# Patient Record
Sex: Female | Born: 1935 | Race: White | Hispanic: No | Smoking: Former smoker
Health system: Southern US, Community
[De-identification: ages and names within clinical notes are randomized; demographics above are authoritative.]

## PROBLEM LIST (undated history)

## (undated) ENCOUNTER — Emergency Department: Payer: Medicare Other

## (undated) DIAGNOSIS — E785 Hyperlipidemia, unspecified: Secondary | ICD-10-CM

## (undated) DIAGNOSIS — C50919 Malignant neoplasm of unspecified site of unspecified female breast: Secondary | ICD-10-CM

## (undated) DIAGNOSIS — I4891 Unspecified atrial fibrillation: Secondary | ICD-10-CM

## (undated) DIAGNOSIS — I10 Essential (primary) hypertension: Secondary | ICD-10-CM

## (undated) DIAGNOSIS — F419 Anxiety disorder, unspecified: Secondary | ICD-10-CM

## (undated) DIAGNOSIS — M109 Gout, unspecified: Secondary | ICD-10-CM

## (undated) DIAGNOSIS — M1990 Unspecified osteoarthritis, unspecified site: Secondary | ICD-10-CM

## (undated) HISTORY — PX: KNEE ARTHROSCOPY: SUR90

## (undated) HISTORY — PX: CATARACT EXTRACTION: SUR2

## (undated) HISTORY — DX: Gout, unspecified: M10.9

## (undated) HISTORY — PX: ABDOMINAL HYSTERECTOMY: SHX81

## (undated) HISTORY — DX: Anxiety disorder, unspecified: F41.9

## (undated) HISTORY — PX: BIOPSY BREAST: PRO8

## (undated) HISTORY — DX: Malignant neoplasm of unspecified site of unspecified female breast: C50.919

---

## 2002-11-03 ENCOUNTER — Encounter: Payer: Self-pay | Admitting: Internal Medicine

## 2002-11-03 ENCOUNTER — Ambulatory Visit (HOSPITAL_COMMUNITY): Admission: RE | Admit: 2002-11-03 | Discharge: 2002-11-03 | Payer: Self-pay | Admitting: Internal Medicine

## 2003-11-09 ENCOUNTER — Ambulatory Visit (HOSPITAL_COMMUNITY): Admission: RE | Admit: 2003-11-09 | Discharge: 2003-11-09 | Payer: Self-pay | Admitting: Internal Medicine

## 2009-10-11 ENCOUNTER — Encounter: Payer: Self-pay | Admitting: Internal Medicine

## 2009-10-27 ENCOUNTER — Ambulatory Visit: Payer: Self-pay | Admitting: Internal Medicine

## 2009-10-28 ENCOUNTER — Telehealth: Payer: Self-pay | Admitting: Internal Medicine
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
## 2010-08-31 NOTE — Letter (Signed)
Summary: [facility]  [facility] Note   Imported By: Roderic Ovens 11/07/2009 11:36:47  _____________________________________________________________________  External Attachment:    Type:   Image     Comment:   External Document

## 2010-08-31 NOTE — Progress Notes (Signed)
Summary: QUESTION ABOUT MEDICATION  Phone Note Call from Patient Call back at Home Phone [phone]   Caller: Patient Summary of Call: PT  HAVE QUESTION ABOUT MEDICATIONS Initial call taken by: Judie Grieve,  October 28, 2009 9:45 AM  Follow-up for Phone Call        Called patient ..she wanted to know what medication Dr.Saylee Sherrill said she could stop. Advised I will ask Dr.Bethann Qualley and call her back. Follow-up by: Suzan Garibaldi RN  Additional Follow-up for Phone Call Additional follow up Details #1::        Discussed above with Dr.Iyanni Hepp...she advised that patient could stop Lonoxin.Marland KitchenMarland KitchenCalled patient and left message on machine with this information. Additional Follow-up by: Suzan Garibaldi RN

## 2011-02-19 ENCOUNTER — Other Ambulatory Visit (HOSPITAL_COMMUNITY): Payer: Self-pay | Admitting: Internal Medicine

## 2011-02-19 ENCOUNTER — Ambulatory Visit (HOSPITAL_COMMUNITY)
Admission: RE | Admit: 2011-02-19 | Discharge: 2011-02-19 | Disposition: A | Discharge status: Home / Self Care | Payer: Medicare Other | Source: Ambulatory Visit | Attending: Internal Medicine | Admitting: Internal Medicine

## 2011-02-19 DIAGNOSIS — R52 Pain, unspecified: Secondary | ICD-10-CM

## 2011-02-19 DIAGNOSIS — M25569 Pain in unspecified knee: Secondary | ICD-10-CM | POA: Insufficient documentation

## 2011-04-30 ENCOUNTER — Ambulatory Visit
Admission: RE | Admit: 2011-04-30 | Discharge: 2011-04-30 | Disposition: A | Discharge status: Home / Self Care | Payer: Medicare Other | Source: Ambulatory Visit | Attending: Orthopaedic Surgery | Admitting: Orthopaedic Surgery

## 2011-04-30 ENCOUNTER — Other Ambulatory Visit: Payer: Self-pay | Admitting: Orthopaedic Surgery

## 2011-04-30 ENCOUNTER — Encounter (HOSPITAL_BASED_OUTPATIENT_CLINIC_OR_DEPARTMENT_OTHER)
Admission: RE | Admit: 2011-04-30 | Discharge: 2011-04-30 | Disposition: A | Discharge status: Home / Self Care | Payer: Medicare Other | Source: Ambulatory Visit | Attending: Orthopaedic Surgery | Admitting: Orthopaedic Surgery

## 2011-04-30 DIAGNOSIS — Z01811 Encounter for preprocedural respiratory examination: Secondary | ICD-10-CM

## 2011-04-30 LAB — BASIC METABOLIC PANEL
BUN: 18 mg/dL (ref 6–23)
Chloride: 101 mEq/L (ref 96–112)
GFR calc Af Amer: 74 mL/min — ABNORMAL LOW (ref >90)
GFR calc non Af Amer: 64 mL/min — ABNORMAL LOW (ref >90)
Potassium: 3.6 mEq/L (ref 3.5–5.1)
Sodium: 140 mEq/L (ref 135–145)

## 2011-05-01 ENCOUNTER — Ambulatory Visit (HOSPITAL_BASED_OUTPATIENT_CLINIC_OR_DEPARTMENT_OTHER)
Admission: RE | Admit: 2011-05-01 | Discharge: 2011-05-01 | Disposition: A | Discharge status: Home / Self Care | Payer: Medicare Other | Source: Ambulatory Visit | Attending: Orthopaedic Surgery | Admitting: Orthopaedic Surgery

## 2011-05-01 DIAGNOSIS — Z01812 Encounter for preprocedural laboratory examination: Secondary | ICD-10-CM | POA: Insufficient documentation

## 2011-05-01 DIAGNOSIS — M9420 Chondromalacia, unspecified site: Secondary | ICD-10-CM | POA: Insufficient documentation

## 2011-05-01 DIAGNOSIS — F329 Major depressive disorder, single episode, unspecified: Secondary | ICD-10-CM | POA: Insufficient documentation

## 2011-05-01 DIAGNOSIS — F3289 Other specified depressive episodes: Secondary | ICD-10-CM | POA: Insufficient documentation

## 2011-05-01 DIAGNOSIS — I10 Essential (primary) hypertension: Secondary | ICD-10-CM | POA: Insufficient documentation

## 2011-05-01 DIAGNOSIS — M23329 Other meniscus derangements, posterior horn of medial meniscus, unspecified knee: Secondary | ICD-10-CM | POA: Insufficient documentation

## 2011-05-01 DIAGNOSIS — Z01818 Encounter for other preprocedural examination: Secondary | ICD-10-CM | POA: Insufficient documentation

## 2011-05-01 LAB — POCT HEMOGLOBIN-HEMACUE: Hemoglobin: 14.9 g/dL (ref 12.0–15.0)

## 2011-05-24 NOTE — Op Note (Signed)
  [name], Ruth Delgado              ACCOUNT NO.:  [id number]  MEDICAL RECORD NO.:  [id number]  LOCATION:                                 FACILITY:  PHYSICIAN:  Lubertha Basque. Wymon Swaney, M.D.DATE OF BIRTH:  03-26-1935  DATE OF PROCEDURE:  05/01/2011 DATE OF DISCHARGE:                              OPERATIVE REPORT   PREOPERATIVE DIAGNOSES: 1. Left knee torn medial meniscus. 2. Left knee chondromalacia.  POSTOPERATIVE DIAGNOSES: 1. Left knee torn medial meniscus. 2. Left knee chondromalacia.  PROCEDURES: 1. Left knee partial medial meniscectomy. 2. Left knee abrasion chondroplasty.  ANESTHESIA:  Knee block MAC.  ATTENDING SURGEON:  Lubertha Basque. Jerl Santos, MD  ASSISTANT:  Lindwood Qua, PA   INDICATIONS FOR PROCEDURE:  The patient is a 76 year old woman with a long history of a painful left knee.  This has persisted despite oral anti-inflammatories and an injection.  By MRI scan, she has medial meniscus tear and a paucity of degenerative issues.  She has pain which limits her by rest and walk and she is offered an arthroscopy.  Informed operative consent was obtained after discussion of possible complications including reaction to anesthesia and infection.  SUMMARY OF FINDINGS AND PROCEDURE:  Under knee block and MAC, an arthroscopy of the left knee was performed.  Suprapatellar pouch was benign while the patellofemoral joint exhibited focal breakdown at the apex of the patella addressed with abrasion to bleeding bone in one tiny area on the patella.  This joint tracked very well.  The medial compartment was notable for posterior horn medial meniscus tear consistent with her scan and about a 10% partial medial meniscectomy was done.  Unfortunately, she also had a quarter-sized area of bare bone with large flaps of articular cartilage on the femur addressed with chondroplasty as appropriate.  An ACL was intact.  Lateral compartment exhibited no evidence of meniscal or articular  cartilage injury.  DESCRIPTION OF PROCEDURE:  The patient was taken to the operating suite where knee block and MAC were applied.  She was positioned supine, prepped and draped in normal sterile fashion.  After administration of IV Kefzol, an arthroscopy of the left knee was performed through a total of two portals.  Findings were as noted above and procedure consisted of the partial medial meniscectomy with basket and shaver.  We also did the abrasion chondroplasty patellofemoral and extensive chondroplasty medial.  The knee was thoroughly irrigated followed by placement of Marcaine with epinephrine and some Depo-Medrol.  Adaptic was applied to the portals followed by dry gauze and a loose Ace wrap.  Estimated blood loss and intraoperative fluids can be obtained from anesthesia records.  DISPOSITION:  The patient was taken to recovery room in stable addition. She was to go home the same-day and follow up in the office in less than a week.  I will contact her by phone tonight.     Lubertha Basque Jerl Santos, M.D.     PGD/MEDQ  D:  05/01/2011  T:  05/01/2011  Job:  [id number]  Electronically Signed by Marcene Corning M.D. on 05/24/2011 01:43:39 PM

## 2011-08-15 DIAGNOSIS — E7800 Pure hypercholesterolemia, unspecified: Secondary | ICD-10-CM | POA: Diagnosis not present

## 2011-08-15 DIAGNOSIS — I10 Essential (primary) hypertension: Secondary | ICD-10-CM | POA: Diagnosis not present

## 2011-08-23 DIAGNOSIS — Z79899 Other long term (current) drug therapy: Secondary | ICD-10-CM | POA: Diagnosis not present

## 2011-08-23 DIAGNOSIS — E559 Vitamin D deficiency, unspecified: Secondary | ICD-10-CM | POA: Diagnosis not present

## 2011-08-23 DIAGNOSIS — E782 Mixed hyperlipidemia: Secondary | ICD-10-CM | POA: Diagnosis not present

## 2011-08-23 DIAGNOSIS — I10 Essential (primary) hypertension: Secondary | ICD-10-CM | POA: Diagnosis not present

## 2011-08-23 DIAGNOSIS — R7309 Other abnormal glucose: Secondary | ICD-10-CM | POA: Diagnosis not present

## 2011-09-07 ENCOUNTER — Emergency Department (HOSPITAL_COMMUNITY)
Admission: EM | Admit: 2011-09-07 | Discharge: 2011-09-07 | Disposition: A | Discharge status: Home / Self Care | Payer: Medicare Other | Attending: Emergency Medicine | Admitting: Emergency Medicine

## 2011-09-07 ENCOUNTER — Encounter (HOSPITAL_COMMUNITY): Payer: Self-pay | Admitting: Emergency Medicine

## 2011-09-07 ENCOUNTER — Emergency Department (HOSPITAL_COMMUNITY): Payer: Medicare Other

## 2011-09-07 ENCOUNTER — Other Ambulatory Visit: Payer: Self-pay

## 2011-09-07 DIAGNOSIS — I10 Essential (primary) hypertension: Secondary | ICD-10-CM | POA: Insufficient documentation

## 2011-09-07 DIAGNOSIS — E119 Type 2 diabetes mellitus without complications: Secondary | ICD-10-CM | POA: Diagnosis not present

## 2011-09-07 DIAGNOSIS — Z79899 Other long term (current) drug therapy: Secondary | ICD-10-CM | POA: Diagnosis not present

## 2011-09-07 DIAGNOSIS — R110 Nausea: Secondary | ICD-10-CM | POA: Insufficient documentation

## 2011-09-07 DIAGNOSIS — M129 Arthropathy, unspecified: Secondary | ICD-10-CM | POA: Diagnosis not present

## 2011-09-07 DIAGNOSIS — R404 Transient alteration of awareness: Secondary | ICD-10-CM | POA: Diagnosis not present

## 2011-09-07 DIAGNOSIS — R42 Dizziness and giddiness: Secondary | ICD-10-CM | POA: Insufficient documentation

## 2011-09-07 DIAGNOSIS — E785 Hyperlipidemia, unspecified: Secondary | ICD-10-CM | POA: Insufficient documentation

## 2011-09-07 DIAGNOSIS — Z7982 Long term (current) use of aspirin: Secondary | ICD-10-CM | POA: Insufficient documentation

## 2011-09-07 DIAGNOSIS — I4891 Unspecified atrial fibrillation: Secondary | ICD-10-CM | POA: Insufficient documentation

## 2011-09-07 DIAGNOSIS — R5381 Other malaise: Secondary | ICD-10-CM | POA: Diagnosis not present

## 2011-09-07 DIAGNOSIS — R55 Syncope and collapse: Secondary | ICD-10-CM | POA: Diagnosis not present

## 2011-09-07 HISTORY — DX: Hyperlipidemia, unspecified: E78.5

## 2011-09-07 HISTORY — DX: Unspecified osteoarthritis, unspecified site: M19.90

## 2011-09-07 HISTORY — DX: Essential (primary) hypertension: I10

## 2011-09-07 HISTORY — DX: Unspecified atrial fibrillation: I48.91

## 2011-09-07 LAB — COMPREHENSIVE METABOLIC PANEL
ALT: 14 U/L (ref 0–35)
AST: 16 U/L (ref 0–37)
Alkaline Phosphatase: 46 U/L (ref 39–117)
CO2: 26 mEq/L (ref 19–32)
Calcium: 9.1 mg/dL (ref 8.4–10.5)
GFR calc non Af Amer: 83 mL/min — ABNORMAL LOW (ref >90)
Potassium: 3.8 mEq/L (ref 3.5–5.1)
Sodium: 138 mEq/L (ref 135–145)
Total Protein: 6.8 g/dL (ref 6.0–8.3)

## 2011-09-07 LAB — URINALYSIS, ROUTINE W REFLEX MICROSCOPIC
Bilirubin Urine: NEGATIVE
Hgb urine dipstick: NEGATIVE
Specific Gravity, Urine: 1.014 (ref 1.005–1.030)
pH: 7.5 (ref 5.0–8.0)

## 2011-09-07 LAB — CBC
MCH: 32.3 pg (ref 26.0–34.0)
Platelets: 172 10*3/uL (ref 150–400)
RBC: 4.64 MIL/uL (ref 3.87–5.11)

## 2011-09-07 MED ORDER — ONDANSETRON HCL 4 MG PO TABS: 4.0000 mg | ORAL_TABLET | Freq: Four times a day (QID) | ORAL | Status: AC

## 2011-09-07 MED ORDER — ONDANSETRON HCL 4 MG/2ML IJ SOLN
4.0000 mg | Freq: Once | INTRAMUSCULAR | Status: AC
  Administered 2011-09-07: 4 mg via INTRAVENOUS
  Filled 2011-09-07: qty 2

## 2011-09-07 MED ORDER — SODIUM CHLORIDE 0.9 % IV BOLUS (SEPSIS)
1000.0000 mL | Freq: Once | INTRAVENOUS | Status: AC
  Administered 2011-09-07: 1000 mL via INTRAVENOUS

## 2011-09-07 NOTE — ED Notes (Signed)
Patient given ginger ale. 

## 2011-09-07 NOTE — ED Notes (Signed)
WJX:[id number]<WG> Expected date:<BR> Expected time:10:52 AM<BR> Means of arrival:Ambulance<BR> Comments:<BR> [id number] - 76YOf nAUSEA, DIZZY, WEAK, ORTHOSTATIC

## 2011-09-07 NOTE — ED Provider Notes (Signed)
History     CSN: [id number]  Arrival date & time 09/07/11  1107   First MD Initiated Contact with Patient 09/07/11 1221      Chief Complaint  Patient presents with  . Near Syncope  . Nausea    (Consider location/radiation/quality/duration/timing/severity/associated sxs/prior treatment) HPI Patient presents with complaint of nausea and generalized weakness. Her symptoms began this morning. She states that when she stood up from bed she was lightheaded and weak all over with nausea. She had no vomiting. She had no syncope or. She denies any sensation of vertigo or movement/spinning. She had no chest pain. She had no focal weakness or numbness changes in vision or changes in speech. She states that she was in her usual state of health yesterday and last night his symptoms began this morning when she woke. There no other associated systemic symptoms. Symptoms are worse with standing and made better by rest.  There are no other alleviating or modifying factors.   Past Medical History  Diagnosis Date  . Diabetes mellitus   . Hypertension   . A-fib   . Arthritis   . Hyperlipemia     Past Surgical History  Procedure Date  . Knee arthroscopy   . Abdominal hysterectomy   . Biopsy breast     (L) BREAST IN 1993    History reviewed. No pertinent family history.  History  Substance Use Topics  . Smoking status: Never Smoker   . Smokeless tobacco: Not on file  . Alcohol Use: No    OB History    Grav Para Term Preterm Abortions TAB SAB Ect Mult Living                  Review of Systems ROS reviewed and otherwise negative except for mentioned in HPI  Allergies  Sulfa antibiotics and Sulfonamide derivatives  Home Medications   Current Outpatient Rx  Name Route Sig Dispense Refill  . ALLOPURINOL 300 MG PO TABS Oral Take 300 mg by mouth daily.    . ASPIRIN EC 81 MG PO TBEC Oral Take 81 mg by mouth daily.    . ATENOLOL 50 MG PO TABS Oral Take 50 mg by mouth daily.    Marland Kitchen  VITAMIN D 1000 UNITS PO TABS Oral Take 1,000 Units by mouth every other day.    Marland Kitchen DILTIAZEM HCL ER 240 MG PO CP24 Oral Take 240 mg by mouth daily.    . OMEGA-3 FATTY ACIDS 1000 MG PO CAPS Oral Take 1 g by mouth daily.    . INDAPAMIDE 1.25 MG PO TABS Oral Take 1.25 mg by mouth every morning.    Marland Kitchen LISINOPRIL 20 MG PO TABS Oral Take 20 mg by mouth daily.    Marland Kitchen PRAVASTATIN SODIUM 40 MG PO TABS Oral Take 40 mg by mouth daily.    Marland Kitchen ONDANSETRON HCL 4 MG PO TABS Oral Take 1 tablet (4 mg total) by mouth every 6 (six) hours. 12 tablet 0    BP 167/84  Pulse 79  Temp(Src) 98.1 F (36.7 C) (Oral)  Resp 16  SpO2 96% Vitals reviewed Physical Exam Physical Examination: General appearance - alert, well appearing, and in no distress Mental status - alert, oriented to person, place, and time Eyes - pupils equal and reactive, extraocular eye movements intact Mouth - mucous membranes moist, pharynx normal without lesions Chest - clear to auscultation, no wheezes, rales or rhonchi, symmetric air entry Heart - normal rate, regular rhythm, normal S1, S2, no murmurs,  rubs, clicks or gallops Abdomen - soft, nontender, nondistended, no masses or organomegaly, nabs Neurological - alert, oriented, normal speech, cranial nerves 2-12 tested and intact, strength 5/5 in extremities x 4, sensation intact, FTN testing intact bilaterally Musculoskeletal - no joint tenderness, deformity or swelling Extremities - peripheral pulses normal, no pedal edema, no clubbing or cyanosis Skin - normal coloration and turgor, no rashes  ED Course  Procedures (including critical care time)   Date: 09/07/2011  Rate: 74  Rhythm: normal sinus rhythm  QRS Axis: normal  Intervals: normal  ST/T Wave abnormalities: normal  Conduction Disutrbances:none  Narrative Interpretation:   Old EKG Reviewed: none available    Labs Reviewed  COMPREHENSIVE METABOLIC PANEL - Abnormal; Notable for the following:    Glucose, Bld 144 (*)    GFR  calc non Af Amer 83 (*)    All other components within normal limits  CBC  URINALYSIS, ROUTINE W REFLEX MICROSCOPIC  LAB REPORT - SCANNED   Ct Head Wo Contrast  09/07/2011  *RADIOLOGY REPORT*  Clinical Data: near-syncope  CT HEAD WITHOUT CONTRAST  Technique:  Contiguous axial images were obtained from the base of the skull through the vertex without contrast.  Comparison: None  Findings: There is diffuse patchy low density throughout the subcortical and periventricular white matter consistent with chronic small vessel ischemic change.  There is prominence of the sulci and ventricles consistent with brain atrophy.  There is no evidence for acute brain infarct, hemorrhage or mass.  The paranasal sinuses are clear.  The mastoid air cells are clear. The skull appears intact.  IMPRESSION:  1.  No acute intracranial abnormalities.  Original Report Authenticated By: Rosealee Albee, M.D.     1. Nausea   2. Lightheaded       MDM  Patient presenting with nausea and generalized weakness. Her EKG was reassuring as well as other workup including labs urine and head CT performed to the ED today. She is not orthostatic. She did not syncope eyes. She is tolerating oral fluids in the ED without difficulty. Patient will be discharged with prescription for antibiotics and was given strict return precautions. She is agreeable with this plan.        Ethelda Chick, MD 09/08/11 1114

## 2011-09-07 NOTE — ED Notes (Signed)
Vital signs stable. 

## 2011-09-07 NOTE — ED Notes (Signed)
Pt brought by ems from homes with c/o nausea/vomitng/weakness. Was given 4 mg zofran,and  300 ml ns.

## 2011-09-07 NOTE — ED Notes (Signed)
Patient is resting comfortably. 

## 2011-09-07 NOTE — ED Notes (Signed)
MD at bedside. 

## 2011-09-07 NOTE — ED Notes (Signed)
Family at bedside. 

## 2011-09-07 NOTE — ED Notes (Signed)
Patient transported to CT 

## 2011-09-07 NOTE — ED Notes (Signed)
Patient denies pain and is resting comfortably.  

## 2011-09-12 DIAGNOSIS — H8120 Vestibular neuronitis, unspecified ear: Secondary | ICD-10-CM | POA: Diagnosis not present

## 2011-09-19 DIAGNOSIS — H8120 Vestibular neuronitis, unspecified ear: Secondary | ICD-10-CM | POA: Diagnosis not present

## 2011-11-21 DIAGNOSIS — Z79899 Other long term (current) drug therapy: Secondary | ICD-10-CM | POA: Diagnosis not present

## 2011-11-21 DIAGNOSIS — E782 Mixed hyperlipidemia: Secondary | ICD-10-CM | POA: Diagnosis not present

## 2011-11-21 DIAGNOSIS — R7309 Other abnormal glucose: Secondary | ICD-10-CM | POA: Diagnosis not present

## 2011-11-21 DIAGNOSIS — I10 Essential (primary) hypertension: Secondary | ICD-10-CM | POA: Diagnosis not present

## 2011-11-21 DIAGNOSIS — E559 Vitamin D deficiency, unspecified: Secondary | ICD-10-CM | POA: Diagnosis not present

## 2012-02-13 DIAGNOSIS — I10 Essential (primary) hypertension: Secondary | ICD-10-CM | POA: Diagnosis not present

## 2012-02-13 DIAGNOSIS — E7800 Pure hypercholesterolemia, unspecified: Secondary | ICD-10-CM | POA: Diagnosis not present

## 2012-02-20 DIAGNOSIS — R5381 Other malaise: Secondary | ICD-10-CM | POA: Diagnosis not present

## 2012-02-20 DIAGNOSIS — E782 Mixed hyperlipidemia: Secondary | ICD-10-CM | POA: Diagnosis not present

## 2012-02-20 DIAGNOSIS — E559 Vitamin D deficiency, unspecified: Secondary | ICD-10-CM | POA: Diagnosis not present

## 2012-02-20 DIAGNOSIS — R5383 Other fatigue: Secondary | ICD-10-CM | POA: Diagnosis not present

## 2012-02-20 DIAGNOSIS — I10 Essential (primary) hypertension: Secondary | ICD-10-CM | POA: Diagnosis not present

## 2012-03-17 DIAGNOSIS — I789 Disease of capillaries, unspecified: Secondary | ICD-10-CM | POA: Diagnosis not present

## 2012-03-17 DIAGNOSIS — L820 Inflamed seborrheic keratosis: Secondary | ICD-10-CM | POA: Diagnosis not present

## 2012-03-17 DIAGNOSIS — L570 Actinic keratosis: Secondary | ICD-10-CM | POA: Diagnosis not present

## 2012-03-17 DIAGNOSIS — D235 Other benign neoplasm of skin of trunk: Secondary | ICD-10-CM | POA: Diagnosis not present

## 2012-03-17 DIAGNOSIS — D485 Neoplasm of uncertain behavior of skin: Secondary | ICD-10-CM | POA: Diagnosis not present

## 2012-05-30 DIAGNOSIS — E782 Mixed hyperlipidemia: Secondary | ICD-10-CM | POA: Diagnosis not present

## 2012-05-30 DIAGNOSIS — Z23 Encounter for immunization: Secondary | ICD-10-CM | POA: Diagnosis not present

## 2012-05-30 DIAGNOSIS — Z1212 Encounter for screening for malignant neoplasm of rectum: Secondary | ICD-10-CM | POA: Diagnosis not present

## 2012-05-30 DIAGNOSIS — R7309 Other abnormal glucose: Secondary | ICD-10-CM | POA: Diagnosis not present

## 2012-05-30 DIAGNOSIS — Z79899 Other long term (current) drug therapy: Secondary | ICD-10-CM | POA: Diagnosis not present

## 2012-05-30 DIAGNOSIS — R5383 Other fatigue: Secondary | ICD-10-CM | POA: Diagnosis not present

## 2012-05-30 DIAGNOSIS — E559 Vitamin D deficiency, unspecified: Secondary | ICD-10-CM | POA: Diagnosis not present

## 2012-05-30 DIAGNOSIS — Z111 Encounter for screening for respiratory tuberculosis: Secondary | ICD-10-CM | POA: Diagnosis not present

## 2012-05-30 DIAGNOSIS — R5381 Other malaise: Secondary | ICD-10-CM | POA: Diagnosis not present

## 2012-05-30 DIAGNOSIS — I10 Essential (primary) hypertension: Secondary | ICD-10-CM | POA: Diagnosis not present

## 2012-07-02 DIAGNOSIS — M25579 Pain in unspecified ankle and joints of unspecified foot: Secondary | ICD-10-CM | POA: Diagnosis not present

## 2012-07-29 DIAGNOSIS — H11159 Pinguecula, unspecified eye: Secondary | ICD-10-CM | POA: Diagnosis not present

## 2012-07-29 DIAGNOSIS — E119 Type 2 diabetes mellitus without complications: Secondary | ICD-10-CM | POA: Diagnosis not present

## 2012-07-29 DIAGNOSIS — H25099 Other age-related incipient cataract, unspecified eye: Secondary | ICD-10-CM | POA: Diagnosis not present

## 2012-08-21 DIAGNOSIS — I10 Essential (primary) hypertension: Secondary | ICD-10-CM | POA: Diagnosis not present

## 2012-08-21 DIAGNOSIS — E7800 Pure hypercholesterolemia, unspecified: Secondary | ICD-10-CM | POA: Diagnosis not present

## 2012-09-02 DIAGNOSIS — E782 Mixed hyperlipidemia: Secondary | ICD-10-CM | POA: Diagnosis not present

## 2012-09-02 DIAGNOSIS — E559 Vitamin D deficiency, unspecified: Secondary | ICD-10-CM | POA: Diagnosis not present

## 2012-09-02 DIAGNOSIS — I10 Essential (primary) hypertension: Secondary | ICD-10-CM | POA: Diagnosis not present

## 2012-09-02 DIAGNOSIS — Z79899 Other long term (current) drug therapy: Secondary | ICD-10-CM | POA: Diagnosis not present

## 2012-09-02 DIAGNOSIS — E119 Type 2 diabetes mellitus without complications: Secondary | ICD-10-CM | POA: Diagnosis not present

## 2012-09-17 DIAGNOSIS — M25569 Pain in unspecified knee: Secondary | ICD-10-CM | POA: Diagnosis not present

## 2012-10-20 DIAGNOSIS — M25569 Pain in unspecified knee: Secondary | ICD-10-CM | POA: Diagnosis not present

## 2012-12-02 DIAGNOSIS — R7309 Other abnormal glucose: Secondary | ICD-10-CM | POA: Diagnosis not present

## 2012-12-02 DIAGNOSIS — Z79899 Other long term (current) drug therapy: Secondary | ICD-10-CM | POA: Diagnosis not present

## 2012-12-02 DIAGNOSIS — M109 Gout, unspecified: Secondary | ICD-10-CM | POA: Diagnosis not present

## 2012-12-02 DIAGNOSIS — E559 Vitamin D deficiency, unspecified: Secondary | ICD-10-CM | POA: Diagnosis not present

## 2012-12-02 DIAGNOSIS — I10 Essential (primary) hypertension: Secondary | ICD-10-CM | POA: Diagnosis not present

## 2012-12-02 DIAGNOSIS — E782 Mixed hyperlipidemia: Secondary | ICD-10-CM | POA: Diagnosis not present

## 2012-12-29 DIAGNOSIS — Z1231 Encounter for screening mammogram for malignant neoplasm of breast: Secondary | ICD-10-CM | POA: Diagnosis not present

## 2013-02-18 DIAGNOSIS — E7800 Pure hypercholesterolemia, unspecified: Secondary | ICD-10-CM | POA: Diagnosis not present

## 2013-02-18 DIAGNOSIS — I10 Essential (primary) hypertension: Secondary | ICD-10-CM | POA: Diagnosis not present

## 2013-03-11 DIAGNOSIS — E782 Mixed hyperlipidemia: Secondary | ICD-10-CM | POA: Diagnosis not present

## 2013-03-11 DIAGNOSIS — E538 Deficiency of other specified B group vitamins: Secondary | ICD-10-CM | POA: Diagnosis not present

## 2013-03-11 DIAGNOSIS — D649 Anemia, unspecified: Secondary | ICD-10-CM | POA: Diagnosis not present

## 2013-03-11 DIAGNOSIS — R5383 Other fatigue: Secondary | ICD-10-CM | POA: Diagnosis not present

## 2013-03-11 DIAGNOSIS — I10 Essential (primary) hypertension: Secondary | ICD-10-CM | POA: Diagnosis not present

## 2013-03-11 DIAGNOSIS — R7309 Other abnormal glucose: Secondary | ICD-10-CM | POA: Diagnosis not present

## 2013-03-11 DIAGNOSIS — Z79899 Other long term (current) drug therapy: Secondary | ICD-10-CM | POA: Diagnosis not present

## 2013-03-11 DIAGNOSIS — M109 Gout, unspecified: Secondary | ICD-10-CM | POA: Diagnosis not present

## 2013-03-11 DIAGNOSIS — E559 Vitamin D deficiency, unspecified: Secondary | ICD-10-CM | POA: Diagnosis not present

## 2013-06-06 ENCOUNTER — Encounter: Payer: Self-pay | Admitting: Internal Medicine

## 2013-06-06 DIAGNOSIS — I10 Essential (primary) hypertension: Secondary | ICD-10-CM | POA: Insufficient documentation

## 2013-06-06 DIAGNOSIS — E785 Hyperlipidemia, unspecified: Secondary | ICD-10-CM | POA: Insufficient documentation

## 2013-06-06 DIAGNOSIS — M109 Gout, unspecified: Secondary | ICD-10-CM | POA: Insufficient documentation

## 2013-06-06 DIAGNOSIS — E1122 Type 2 diabetes mellitus with diabetic chronic kidney disease: Secondary | ICD-10-CM | POA: Insufficient documentation

## 2013-06-06 DIAGNOSIS — N183 Chronic kidney disease, stage 3 unspecified: Secondary | ICD-10-CM | POA: Insufficient documentation

## 2013-06-08 ENCOUNTER — Other Ambulatory Visit: Payer: Self-pay | Admitting: Internal Medicine

## 2013-06-08 DIAGNOSIS — I10 Essential (primary) hypertension: Secondary | ICD-10-CM

## 2013-06-08 DIAGNOSIS — Z79899 Other long term (current) drug therapy: Secondary | ICD-10-CM

## 2013-06-08 DIAGNOSIS — E559 Vitamin D deficiency, unspecified: Secondary | ICD-10-CM

## 2013-06-08 DIAGNOSIS — E119 Type 2 diabetes mellitus without complications: Secondary | ICD-10-CM

## 2013-06-08 DIAGNOSIS — Z1212 Encounter for screening for malignant neoplasm of rectum: Secondary | ICD-10-CM

## 2013-06-08 DIAGNOSIS — E785 Hyperlipidemia, unspecified: Secondary | ICD-10-CM

## 2013-06-08 DIAGNOSIS — Z0000 Encounter for general adult medical examination without abnormal findings: Secondary | ICD-10-CM

## 2013-06-09 ENCOUNTER — Ambulatory Visit: Payer: Medicare Other | Admitting: Internal Medicine

## 2013-06-09 ENCOUNTER — Encounter: Payer: Self-pay | Admitting: Internal Medicine

## 2013-06-09 VITALS — BP 138/72 | HR 60 | Temp 98.2°F | Resp 16 | Ht 65.0 in | Wt 165.2 lb

## 2013-06-09 DIAGNOSIS — E782 Mixed hyperlipidemia: Secondary | ICD-10-CM

## 2013-06-09 DIAGNOSIS — E119 Type 2 diabetes mellitus without complications: Secondary | ICD-10-CM

## 2013-06-09 DIAGNOSIS — E559 Vitamin D deficiency, unspecified: Secondary | ICD-10-CM

## 2013-06-09 DIAGNOSIS — I10 Essential (primary) hypertension: Secondary | ICD-10-CM

## 2013-06-09 DIAGNOSIS — Z79899 Other long term (current) drug therapy: Secondary | ICD-10-CM | POA: Diagnosis not present

## 2013-06-09 DIAGNOSIS — Z1212 Encounter for screening for malignant neoplasm of rectum: Secondary | ICD-10-CM | POA: Diagnosis not present

## 2013-06-09 DIAGNOSIS — E785 Hyperlipidemia, unspecified: Secondary | ICD-10-CM | POA: Diagnosis not present

## 2013-06-09 DIAGNOSIS — Z0000 Encounter for general adult medical examination without abnormal findings: Secondary | ICD-10-CM

## 2013-06-09 LAB — BASIC METABOLIC PANEL WITH GFR
CO2: 30 mEq/L (ref 19–32)
Creat: 0.84 mg/dL (ref 0.50–1.10)
GFR, Est African American: 78 mL/min
Glucose, Bld: 98 mg/dL (ref 70–99)
Potassium: 3.7 mEq/L (ref 3.5–5.3)
Sodium: 138 mEq/L (ref 135–145)

## 2013-06-09 LAB — HEPATIC FUNCTION PANEL
ALT: 18 U/L (ref 0–35)
Albumin: 4 g/dL (ref 3.5–5.2)
Bilirubin, Direct: 0.1 mg/dL (ref 0.0–0.3)
Indirect Bilirubin: 0.5 mg/dL (ref 0.0–0.9)

## 2013-06-09 LAB — CBC WITH DIFFERENTIAL/PLATELET
Basophils Relative: 1 % (ref 0–1)
Eosinophils Relative: 2 % (ref 0–5)
Hemoglobin: 14 g/dL (ref 12.0–15.0)
Lymphocytes Relative: 39 % (ref 12–46)
Lymphs Abs: 2.5 10*3/uL (ref 0.7–4.0)
Monocytes Relative: 7 % (ref 3–12)
Neutro Abs: 3.3 10*3/uL (ref 1.7–7.7)
Neutrophils Relative %: 51 % (ref 43–77)
RBC: 4.31 MIL/uL (ref 3.87–5.11)
WBC: 6.3 10*3/uL (ref 4.0–10.5)

## 2013-06-09 LAB — MAGNESIUM: Magnesium: 1.9 mg/dL (ref 1.5–2.5)

## 2013-06-09 LAB — LIPID PANEL
Cholesterol: 142 mg/dL (ref 0–200)
LDL Cholesterol: 67 mg/dL (ref 0–99)
Triglycerides: 118 mg/dL (ref <150)
VLDL: 24 mg/dL (ref 0–40)

## 2013-06-09 LAB — TSH: TSH: 1.58 u[IU]/mL (ref 0.350–4.500)

## 2013-06-09 NOTE — Progress Notes (Signed)
Patient ID: Ruth Delgado, female   DOB: 02-Aug-1934, 78 y.o.   MRN: [id number]   HPI Very nice 77 yo MWF  presents for complete physical.  Patient's BP has been controlled at home. Patient denies any cardiac Symptoms as chest pain, palpitations, shortness of breath, dizziness or ankle swelling.P{atient exercises by walking 30 min./day.  Patient's hyperlipidemia is controlled with diet and medications. Patient denies myalgias or other medication SE's. Last cholesterol last visit was144  , HDL 50, and LDL 70  .   Patient has prediabetes/insulin resistance with last A1c  6.3%. Patient denies reactive hypoglycemic symptoms, visual blurring, diabetic polys, or paresthesias.   Other problems include Vitamin D Deficiency for which patient is supplementing Vitamin D.  Pt also has hx/o gout with no recent arthritic sx's.      Medication List       This list is accurate as of: 06/09/13 10:01 AM.  Always use your most recent med list.               allopurinol 300 MG tablet  Commonly known as:  ZYLOPRIM  Take 300 mg by mouth daily.     aspirin EC 81 MG tablet  Take 81 mg by mouth daily.     atenolol 50 MG tablet  Commonly known as:  TENORMIN  Take 50 mg by mouth daily.     cholecalciferol 1000 UNITS tablet  Commonly known as:  VITAMIN D  Take 5,000 Units by mouth every other day.     citalopram 40 MG tablet  Commonly known as:  CELEXA  Take 40 mg by mouth daily.     diltiazem 240 MG 24 hr capsule  Commonly known as:  DILACOR XR  Take 240 mg by mouth daily.     fish oil-omega-3 fatty acids 1000 MG capsule  Take 1 g by mouth daily.     indapamide 1.25 MG tablet  Commonly known as:  LOZOL  Take 1.25 mg by mouth every morning.     lisinopril 20 MG tablet  Commonly known as:  PRINIVIL,ZESTRIL  Take 20 mg by mouth daily.     Magnesium 250 MG Tabs  Take 250 mg by mouth daily.     pravastatin 40 MG tablet  Commonly known as:  PRAVACHOL  Take 40 mg by mouth daily.     ranitidine 300 MG capsule  Commonly known as:  ZANTAC  Take 300 mg by mouth every evening.     SUPER B COMPLEX PO  Take by mouth daily.        Health Maintenance  Topic Date Due  . Colonoscopy  02/28/1987  . Influenza Vaccine  02/27/2013  . Tetanus/tdap  06/01/2017  . Pneumococcal Polysaccharide Vaccine Age 42 And Over  Completed  . Zostavax  Completed    Allergies  Allergen Reactions  . Buspar [Buspirone]     dysphoria  . Lipitor [Atorvastatin]     Myalgias  . Loop Diuretics     Fatigue/weakness  . Metformin And Related     Gas/bloating  . Nsaids     GI upset  . Sulfa Antibiotics Other (See Comments)    Reaction=throat itching  . Sulfonamide Derivatives     Past Medical History  Diagnosis Date  . A-fib   . Hypertension   . Hyperlipemia   . Arthritis   . Gout   . Anxiety   . Diabetes mellitus     Past Surgical History  Procedure Laterality Date  .  Knee arthroscopy    . Abdominal hysterectomy    . Biopsy breast      (L) BREAST IN 1993    Family History  Problem Relation Age of Onset  . Diabetes Mother   . Hypertension Father   . CVA Father     History   Social History  . Marital Status: Married    Spouse Name: N/A    Number of Children: N/A  . Years of Education: N/A   Occupational History  . Not on file.   Social History Main Topics  . Smoking status: Former Smoker    Quit date: 07/30/2006  . Smokeless tobacco: Not on file  . Alcohol Use: 1.0 oz/week    2 drink(s) per week  . Drug Use: Not on file  . Sexual Activity: Not on file   Other Topics Concern  . Not on file   Social History Narrative  . No narrative on file    SYSTEMS REVIEW Constitutional: Denies fever, chills, weight loss/gain, headaches, insomnia, fatigue, night sweats, and change in appetite. Eyes: Denies redness, blurred vision, diplopia, discharge, itchy, watery eyes.  ENT: Denies discharge, congestion, post nasal drip, epistaxis, sore throat, earache, hearing  loss, dental pain, Tinnitus, Vertigo, Sinus pain, snoring.  Cardio: Denies chest pain, palpitations, irregular heartbeat, syncope, dyspnea, diaphoresis, orthopnea, PND, claudication, edema Respiratory: denies cough, dyspnea, DOE, pleurisy, hoarseness, laryngitis, wheezing.  Gastrointestinal: Denies dysphagia, heartburn, reflux, water brash, pain, cramps, nausea, vomiting, bloating, diarrhea, constipation, hematemesis, melena, hematochezia, jaundice, hemorrhoids Genitourinary: Denies dysuria, frequency, urgency, nocturia, hesitancy, discharge, hematuria, flank pain Musculoskeletal: Denies arthralgia, myalgia, stiffness, Jt. Swelling, pain, limp, and strain/sprain. Skin: Denies puritis, rash, hives, warts, acne, eczema, changing in skin lesion Neuro: Weakness, tremor, incoordination, spasms, paresthesia, pain Psychiatric: Denies confusion, memory loss, sensory loss Endocrine: Denies change in weight, skin, hair change, nocturia, and paresthesia, Diabetic Polys, visual blurring, hyper /hypo glycemic episodes.  Heme/Lymph: Excessive bleeding, bruising, enlarged lymph node   Physical Exam: Filed Vitals:   06/09/13 0944  BP: 138/72  Pulse: 60  Temp: 98.2 F (36.8 C)  Resp: 16   Body mass index is 27.49 kg/(m^2).  General Appearance: Well nourished, in no apparent distress. Eyes: PERRLA, EOMs, conjunctiva no swelling or erythema, normal fundi and vessels. Sinuses: No Frontal/maxillary tenderness ENT/Mouth: EACs Patent / TMs  nl. Nares clear without erythema, swelling, mucoid exudates. Good dentition. No erythema, swelling, or exudate on post pharynx. Tongue normal, non-obstructing. Tonsils not swollen or erythematous. Hearing normal.  Neck: Supple, thyroid normal. No bruits or JVD. Respiratory: Respiratory effort normal.  BS equal bilaterally without rales, rhonci, wheezing or stridor. Cardio: Heart sounds normal, regular rate and rhythm without murmurs, rubs or gallops. Peripheral pulses  brisk and equal bilaterally, without edema. No aortic or femoral bruits. Chest: symmetric, with normal excursions and percussion. Breasts: Normal w/ abn. Masses or tenderness Abdomen: Flat, soft, with bowl sounds. Nontender, no guarding, rebound, hernias, masses, or organomegaly.  Lymphatics: Non tender without lymphadenopathy.  Musculoskeletal: Full ROM all peripheral extremities, joint stability, 5/5 strength, and normal gait. Skin: Warm, dry without rashes, lesions, ecchymosis.  Neuro: Cranial nerves intact, reflexes equal bilaterally. Normal muscle tone, no cerebellar symptoms. Sensation intact.  Pysch: Awake and oriented X 3, normal affect, Insight and Judgment appropriate.   Assessment and Plan  1.Hypertension - continue diet, exercise and meds as discussed  2. Hyperlipidemia - continue low cholesterol diet, exercise and meds as discussed  3. NIDDM - diet, exercise, weight loss as discussed  4.  Vitamin D Deficiency - Supplement as discussed  5. Gout - continue allopurinol  Plan routine screening labs, EKG, hemoccult, Aortoscan

## 2013-06-09 NOTE — Patient Instructions (Signed)
Continue diet and meds as discussed. Further disposition pending results of labs.  Hypertension As your heart beats, it forces blood through your arteries. This force is your blood pressure. If the pressure is too high, it is called hypertension (HTN) or high blood pressure. HTN is dangerous because you may have it and not know it. High blood pressure may mean that your heart has to work harder to pump blood. Your arteries may be narrow or stiff. The extra work puts you at risk for heart disease, stroke, and other problems.  Blood pressure consists of two numbers, a higher number over a lower, 110/72, for example. It is stated as "110 over 72." The ideal is below 120 for the top number (systolic) and under 80 for the bottom (diastolic). Write down your blood pressure today. You should pay close attention to your blood pressure if you have certain conditions such as:  Heart failure.  Prior heart attack.  Diabetes  Chronic kidney disease.  Prior stroke.  Multiple risk factors for heart disease. To see if you have HTN, your blood pressure should be measured while you are seated with your arm held at the level of the heart. It should be measured at least twice. A one-time elevated blood pressure reading (especially in the Emergency Department) does not mean that you need treatment. There may be conditions in which the blood pressure is different between your right and left arms. It is important to see your caregiver soon for a recheck. Most people have essential hypertension which means that there is not a specific cause. This type of high blood pressure may be lowered by changing lifestyle factors such as:  Stress.  Smoking.  Lack of exercise.  Excessive weight.  Drug/tobacco/alcohol use.  Eating less salt. Most people do not have symptoms from high blood pressure until it has caused damage to the body. Effective treatment can often prevent, delay or reduce that damage. TREATMENT  When  a cause has been identified, treatment for high blood pressure is directed at the cause. There are a large number of medications to treat HTN. These fall into several categories, and your caregiver will help you select the medicines that are best for you. Medications may have side effects. You should review side effects with your caregiver. If your blood pressure stays high after you have made lifestyle changes or started on medicines,   Your medication(s) may need to be changed.  Other problems may need to be addressed.  Be certain you understand your prescriptions, and know how and when to take your medicine.  Be sure to follow up with your caregiver within the time frame advised (usually within two weeks) to have your blood pressure rechecked and to review your medications.  If you are taking more than one medicine to lower your blood pressure, make sure you know how and at what times they should be taken. Taking two medicines at the same time can result in blood pressure that is too low. SEEK IMMEDIATE MEDICAL CARE IF:  You develop a severe headache, blurred or changing vision, or confusion.  You have unusual weakness or numbness, or a faint feeling.  You have severe chest or abdominal pain, vomiting, or breathing problems. MAKE SURE YOU:   Understand these instructions.  Will watch your condition.  Will get help right away if you are not doing well or get worse. Document Released: 07/16/2005 Document Revised: 10/08/2011 Document Reviewed: 03/05/2008 [facility] Patient Information 2014 [place], [place].   Diabetes  and Exercise Exercising regularly is important. It is not just about losing weight. It has many health benefits, such as:  Improving your overall fitness, flexibility, and endurance.  Increasing your bone density.  Helping with weight control.  Decreasing your body fat.  Increasing your muscle strength.  Reducing stress and tension.  Improving your overall  health. People with diabetes who exercise gain additional benefits because exercise:  Reduces appetite.  Improves the body's use of blood sugar (glucose).  Helps lower or control blood glucose.  Decreases blood pressure.  Helps control blood lipids (such as cholesterol and triglycerides).  Improves the body's use of the hormone insulin by:  Increasing the body's insulin sensitivity.  Reducing the body's insulin needs.  Decreases the risk for heart disease because exercising:  Lowers cholesterol and triglycerides levels.  Increases the levels of good cholesterol (such as high-density lipoproteins [HDL]) in the body.  Lowers blood glucose levels. YOUR ACTIVITY PLAN  Choose an activity that you enjoy and set realistic goals. Your health care provider or diabetes educator can help you make an activity plan that works for you. You can break activities into 2 or 3 sessions throughout the day. Doing so is as good as one long session. Exercise ideas include:  Taking the dog for a walk.  Taking the stairs instead of the elevator.  Dancing to your favorite song.  Doing your favorite exercise with a friend. RECOMMENDATIONS FOR EXERCISING WITH TYPE 1 OR TYPE 2 DIABETES   Check your blood glucose before exercising. If blood glucose levels are greater than 240 mg/dL, check for urine ketones. Do not exercise if ketones are present.  Avoid injecting insulin into areas of the body that are going to be exercised. For example, avoid injecting insulin into:  The arms when playing tennis.  The legs when jogging.  Keep a record of:  Food intake before and after you exercise.  Expected peak times of insulin action.  Blood glucose levels before and after you exercise.  The type and amount of exercise you have done.  Review your records with your health care provider. Your health care provider will help you to develop guidelines for adjusting food intake and insulin amounts before and  after exercising.  If you take insulin or oral hypoglycemic agents, watch for signs and symptoms of hypoglycemia. They include:  Dizziness.  Shaking.  Sweating.  Chills.  Confusion.  Drink plenty of water while you exercise to prevent dehydration or heat stroke. Body water is lost during exercise and must be replaced.  Talk to your health care provider before starting an exercise program to make sure it is safe for you. Remember, almost any type of activity is better than none. Document Released: 10/06/2003 Document Revised: 03/18/2013 Document Reviewed: 12/23/2012 [facility] Patient Information 2014 [place], [place].   Cholesterol Cholesterol is a white, waxy, fat-like protein needed by your body in small amounts. The liver makes all the cholesterol you need. It is carried from the liver by the blood through the blood vessels. Deposits (plaque) may build up on blood vessel walls. This makes the arteries narrower and stiffer. Plaque increases the risk for heart attack and stroke. You cannot feel your cholesterol level even if it is very high. The only way to know is by a blood test to check your lipid (fats) levels. Once you know your cholesterol levels, you should keep a record of the test results. Work with your caregiver to to keep your levels in the desired range.  WHAT THE RESULTS MEAN:  Total cholesterol is a rough measure of all the cholesterol in your blood.  LDL is the so-called bad cholesterol. This is the type that deposits cholesterol in the walls of the arteries. You want this level to be low.  HDL is the good cholesterol because it cleans the arteries and carries the LDL away. You want this level to be high.  Triglycerides are fat that the body can either burn for energy or store. High levels are closely linked to heart disease. DESIRED LEVELS:  Total cholesterol below 200.  LDL below 100 for people at risk, below 70 for very high risk.  HDL above 50 is good, above 60  is best.  Triglycerides below 150. HOW TO LOWER YOUR CHOLESTEROL:  Diet.  Choose fish or white meat chicken and [place], roasted or baked. Limit fatty cuts of red meat, fried foods, and processed meats, such as sausage and lunch meat.  Eat lots of fresh fruits and vegetables. Choose whole grains, beans, pasta, potatoes and cereals.  Use only small amounts of olive, corn or canola oils. Avoid butter, mayonnaise, shortening or palm kernel oils. Avoid foods with trans-fats.  Use skim/nonfat milk and low-fat/nonfat yogurt and cheeses. Avoid whole milk, cream, ice cream, egg yolks and cheeses. Healthy desserts include angel food cake, ginger snaps, animal crackers, hard candy, popsicles, and low-fat/nonfat frozen yogurt. Avoid pastries, cakes, pies and cookies.  Exercise.  A regular program helps decrease LDL and raises HDL.  Helps with weight control.  Do things that increase your activity level like gardening, walking, or taking the stairs.  Medication.  May be prescribed by your caregiver to help lowering cholesterol and the risk for heart disease.  You may need medicine even if your levels are normal if you have several risk factors. HOME CARE INSTRUCTIONS   Follow your diet and exercise programs as suggested by your caregiver.  Take medications as directed.  Have blood work done when your caregiver feels it is necessary. MAKE SURE YOU:   Understand these instructions.  Will watch your condition.  Will get help right away if you are not doing well or get worse. Document Released: 04/10/2001 Document Revised: 10/08/2011 Document Reviewed: 10/01/2007 [facility] Patient Information 2014 [place], [place].   Vitamin D Deficiency Vitamin D is an important vitamin that your body needs. Having too little of it in your body is called a deficiency. A very bad deficiency can make your bones soft and can cause a condition called rickets.  Vitamin D is important to your body for  different reasons, such as:   It helps your body absorb 2 minerals called calcium and phosphorus.  It helps make your bones healthy.  It may prevent some diseases, such as diabetes and multiple sclerosis.  It helps your muscles and heart. You can get vitamin D in several ways. It is a natural part of some foods. The vitamin is also added to some dairy products and cereals. Some people take vitamin D supplements. Also, your body makes vitamin D when you are in the sun. It changes the sun's rays into a form of the vitamin that your body can use. CAUSES   Not eating enough foods that contain vitamin D.  Not getting enough sunlight.  Having certain digestive system diseases that make it hard to absorb vitamin D. These diseases include Crohn's disease, chronic pancreatitis, and cystic fibrosis.  Having a surgery in which part of the stomach or small intestine is removed.  Being obese. Fat cells pull vitamin D out of your blood. That means that obese people may not have enough vitamin D left in their blood and in other body tissues.  Having chronic kidney or liver disease. RISK FACTORS Risk factors are things that make you more likely to develop a vitamin D deficiency. They include:  Being older.  Not being able to get outside very much.  Living in a nursing home.  Having had broken bones.  Having weak or thin bones (osteoporosis).  Having a disease or condition that changes how your body absorbs vitamin D.  Having dark skin.  Some medicines such as seizure medicines or steroids.  Being overweight or obese. SYMPTOMS Mild cases of vitamin D deficiency may not have any symptoms. If you have a very bad case, symptoms may include:  Bone pain.  Muscle pain.  Falling often.  Broken bones caused by a minor injury, due to osteoporosis. DIAGNOSIS A blood test is the best way to tell if you have a vitamin D deficiency. TREATMENT Vitamin D deficiency can be treated in different  ways. Treatment for vitamin D deficiency depends on what is causing it. Options include:  Taking vitamin D supplements.  Taking a calcium supplement. Your caregiver will suggest what dose is best for you. HOME CARE INSTRUCTIONS  Take any supplements that your caregiver prescribes. Follow the directions carefully. Take only the suggested amount.  Have your blood tested 2 months after you start taking supplements.  Eat foods that contain vitamin D. Healthy choices include:  Fortified dairy products, cereals, or juices. Fortified means vitamin D has been added to the food. Check the label on the package to be sure.  Fatty fish like salmon or trout.  Eggs.  Oysters.  Do not use a tanning bed.  Keep your weight at a healthy level. Lose weight if you need to.  Keep all follow-up appointments. Your caregiver will need to perform blood tests to make sure your vitamin D deficiency is going away. SEEK MEDICAL CARE IF:  You have any questions about your treatment.  You continue to have symptoms of vitamin D deficiency.  You have nausea or vomiting.  You are constipated.  You feel confused.  You have severe abdominal or back pain. MAKE SURE YOU:  Understand these instructions.  Will watch your condition.  Will get help right away if you are not doing well or get worse. Document Released: 10/08/2011 Document Revised: 11/10/2012 Document Reviewed: 10/08/2011 [facility] Patient Information 2014 [place], [place].

## 2013-06-09 NOTE — Addendum Note (Signed)
Addended by: Caryl Asp on: 06/09/2013 11:24 AM   Modules accepted: Orders

## 2013-06-10 LAB — URINALYSIS, MICROSCOPIC ONLY
Bacteria, UA: NONE SEEN
Crystals: NONE SEEN

## 2013-06-10 LAB — INSULIN, FASTING: Insulin fasting, serum: 14 u[IU]/mL (ref 3–28)

## 2013-06-10 LAB — MICROALBUMIN / CREATININE URINE RATIO: Microalb Creat Ratio: 7.5 mg/g (ref 0.0–30.0)

## 2013-06-12 ENCOUNTER — Other Ambulatory Visit: Payer: Self-pay | Admitting: Internal Medicine

## 2013-06-12 DIAGNOSIS — I10 Essential (primary) hypertension: Secondary | ICD-10-CM

## 2013-07-06 ENCOUNTER — Other Ambulatory Visit: Payer: Self-pay | Admitting: Internal Medicine

## 2013-07-29 DIAGNOSIS — E119 Type 2 diabetes mellitus without complications: Secondary | ICD-10-CM | POA: Diagnosis not present

## 2013-08-10 ENCOUNTER — Other Ambulatory Visit: Payer: Self-pay | Admitting: Internal Medicine

## 2013-08-25 ENCOUNTER — Other Ambulatory Visit: Payer: Self-pay | Admitting: Internal Medicine

## 2013-08-25 DIAGNOSIS — E7800 Pure hypercholesterolemia, unspecified: Secondary | ICD-10-CM | POA: Diagnosis not present

## 2013-08-25 DIAGNOSIS — I10 Essential (primary) hypertension: Secondary | ICD-10-CM | POA: Diagnosis not present

## 2013-08-25 DIAGNOSIS — IMO0001 Reserved for inherently not codable concepts without codable children: Secondary | ICD-10-CM | POA: Diagnosis not present

## 2013-08-26 ENCOUNTER — Other Ambulatory Visit: Payer: Self-pay | Admitting: Emergency Medicine

## 2013-09-07 ENCOUNTER — Encounter: Payer: Self-pay | Admitting: Physician Assistant

## 2013-09-08 ENCOUNTER — Encounter: Payer: Self-pay | Admitting: Physician Assistant

## 2013-09-08 ENCOUNTER — Ambulatory Visit (INDEPENDENT_AMBULATORY_CARE_PROVIDER_SITE_OTHER): Payer: Medicare Other | Admitting: Physician Assistant

## 2013-09-08 VITALS — BP 140/80 | HR 56 | Temp 97.1°F | Resp 16 | Ht 65.0 in | Wt 164.0 lb

## 2013-09-08 DIAGNOSIS — M899 Disorder of bone, unspecified: Secondary | ICD-10-CM

## 2013-09-08 DIAGNOSIS — I10 Essential (primary) hypertension: Secondary | ICD-10-CM

## 2013-09-08 DIAGNOSIS — E782 Mixed hyperlipidemia: Secondary | ICD-10-CM | POA: Diagnosis not present

## 2013-09-08 DIAGNOSIS — E785 Hyperlipidemia, unspecified: Secondary | ICD-10-CM

## 2013-09-08 DIAGNOSIS — M949 Disorder of cartilage, unspecified: Secondary | ICD-10-CM

## 2013-09-08 DIAGNOSIS — Z79899 Other long term (current) drug therapy: Secondary | ICD-10-CM | POA: Diagnosis not present

## 2013-09-08 DIAGNOSIS — E119 Type 2 diabetes mellitus without complications: Secondary | ICD-10-CM | POA: Diagnosis not present

## 2013-09-08 DIAGNOSIS — E559 Vitamin D deficiency, unspecified: Secondary | ICD-10-CM

## 2013-09-08 DIAGNOSIS — M8580 Other specified disorders of bone density and structure, unspecified site: Secondary | ICD-10-CM

## 2013-09-08 LAB — CBC WITH DIFFERENTIAL/PLATELET
BASOS ABS: 0.1 10*3/uL (ref 0.0–0.1)
Basophils Relative: 1 % (ref 0–1)
EOS ABS: 0.2 10*3/uL (ref 0.0–0.7)
EOS PCT: 2 % (ref 0–5)
HCT: 43.4 % (ref 36.0–46.0)
Hemoglobin: 14.8 g/dL (ref 12.0–15.0)
Lymphocytes Relative: 38 % (ref 12–46)
Lymphs Abs: 2.9 10*3/uL (ref 0.7–4.0)
MCH: 32.8 pg (ref 26.0–34.0)
MCHC: 34.1 g/dL (ref 30.0–36.0)
MCV: 96.2 fL (ref 78.0–100.0)
Monocytes Absolute: 0.5 10*3/uL (ref 0.1–1.0)
Monocytes Relative: 6 % (ref 3–12)
Neutro Abs: 4 10*3/uL (ref 1.7–7.7)
Neutrophils Relative %: 53 % (ref 43–77)
PLATELETS: 196 10*3/uL (ref 150–400)
RBC: 4.51 MIL/uL (ref 3.87–5.11)
RDW: 14 % (ref 11.5–15.5)
WBC: 7.6 10*3/uL (ref 4.0–10.5)

## 2013-09-08 MED ORDER — LISINOPRIL 40 MG PO TABS: 40.0000 mg | ORAL_TABLET | Freq: Every day | ORAL | Status: DC

## 2013-09-08 NOTE — Progress Notes (Signed)
Subjective:   Ruth Delgado is a 79 y.o. female who presents for Medicare Annual Wellness Visit and 3 month follow up on hypertension, diabetes, hyperlipidemia, vitamin D def.  Date of last medicare wellness visit was unknown  Her blood pressure has been controlled at home, today their BP is BP: 140/80 mmHg She denies dizziness. She states 2 times in the morning over the past 3 months she has been waking up with some chest pressure, SOB in the AM, last 5 mins, no radiation, no accompaniments. Nonexertional, some worsening fatigue with going up stairs past 6 months but no CP, SOB.  Her cholesterol is diet controlled. In addition they are on Pravastatin and denies myalgias. Her cholesterol is controlled. The cholesterol last visit was:   Lab Results  Component Value Date   CHOL 142 06/09/2013   HDL 51 06/09/2013   LDLCALC 67 06/09/2013   TRIG 118 06/09/2013   CHOLHDL 2.8 06/09/2013   She has been working on diet and exercise for diabetes, and denies blurry vision, polydipsia, polyphagia and polyuria. Last A1C in the office was:  Lab Results  Component Value Date   HGBA1C 6.8* 06/09/2013   Patient is on Vitamin D supplement.   Names of Other Physician/Practitioners you currently use: 1. [facility]- here for primary care 2. Dr. Lavella Lemons, eye doctor, last visit Dec 2014 3. Dr. Donn Pierini, dentist, last visit 02/2013  q 6 months  Medical Services you may have received from other than [facility] providers in the past year (date may be approximate) Dr. Chalmers Cater, no hospital visit  Screening Tests Health Maintenance  Topic Date Due  . Colonoscopy  02/28/1987  . Influenza Vaccine  02/27/2014  . Tetanus/tdap  06/01/2017  . Pneumococcal Polysaccharide Vaccine Age 56 And Over  Completed  . Zostavax  Completed    Current Problems (verified) Patient Active Problem List   Diagnosis Date Noted  . Unspecified vitamin D deficiency 06/08/2013  . Encounter for  long-term (current) use of other medications 06/08/2013  . Screening for malignant neoplasm of the rectum 06/08/2013  . Hypertension   . Hyperlipemia   . Arthritis   . Gout   . Anxiety   . Diabetes mellitus   . CHEST PAIN UNSPECIFIED 10/27/2009    Immunization History  Administered Date(s) Administered  . Pneumococcal-Unspecified 06/07/2003  . Td 06/02/2007  . Zoster 05/30/2012    Preventative care: Last colonoscopy: refuses Last mammogram: 12/2012 Last pap smear/pelvic exam: refuses  Prior vaccinations: TD : 2008  Influenza: 03/2013  Pneumococcal: 2004 Shingles/Zostavax: 2013  History reviewed: allergies, current medications, past family history, past medical history, past social history, past surgical history and problem list  Risk Factors: Tobacco History  Smoking status  . Former Smoker  . Quit date: 07/30/2006  Smokeless tobacco  . Not on file   She does not smoke.  Patient is a former smoker. Are there smokers in your home (other than you)?  No  Alcohol Current alcohol use: social drinker  Caffeine Current caffeine use: coffee 3 /day  Exercise Current exercise habits: Home exercise routine includes walking 30 hrs per day.  Current exercise: walking  Nutrition/Diet Current diet: in general, a "healthy" diet    Cardiac risk factors: advanced age (older than 60 for men, 17 for women), diabetes mellitus, dyslipidemia, family history of premature cardiovascular disease and hypertension.  Depression Screen Nurse depression screen reviewed.  (Note: if answer to either of the following is "Yes", a more complete depression  screening is indicated)   Q1: Over the past two weeks, have you felt down, depressed or hopeless? No  Q2: Over the past two weeks, have you felt little interest or pleasure in doing things? No  Have you lost interest or pleasure in daily life? No  Do you often feel hopeless? No  Do you cry easily over simple problems? No  Activities  of Daily Living Nurse ADLs screen reviewed.  In your present state of health, do you have any difficulty performing the following activities?:  Driving? No Managing money?  No Feeding yourself? No Getting from bed to chair? No Climbing a flight of stairs? No Preparing food and eating?: No Bathing or showering? No Getting dressed: No Getting to the toilet? No Using the toilet:No Moving around from place to place: No In the past year have you fallen or had a near fall?:No   Are you sexually active?  No  Do you have more than one partner?  No  Vision Difficulties: No  Hearing Difficulties: No Do you often ask people to speak up or repeat themselves? No Do you experience ringing or noises in your ears? No Do you have difficulty understanding soft or whispered voices? No  Cognition  Do you feel that you have a problem with memory? Yes  Do you often misplace items? Yes  Do you feel safe at home?  Yes  Advanced directives Does patient have a [place]? Yes Does patient have a Living Will? Yes    Objective:     Vision and hearing screens reviewed.   Blood pressure 140/80, pulse 56, temperature 97.1 F (36.2 C), resp. rate 16, height 5\' 5"  (1.651 m), weight 164 lb (74.39 kg). Body mass index is 27.29 kg/(m^2).  General appearance: alert, no distress, WD/WN,  female Cognitive Testing  Alert? Yes  Normal Appearance?Yes  Oriented to person? Yes  Place? Yes   Time? Yes  Recall of three objects?  Yes  Can perform simple calculations? Yes  Displays appropriate judgment?Yes  Can read the correct time from a watch face?Yes  HEENT: normocephalic, sclerae anicteric, TMs pearly, nares patent, no discharge or erythema, pharynx normal Oral cavity: MMM, no lesions Neck: supple, no lymphadenopathy, no thyromegaly, no masses Heart: RRR, normal S1, S2, no murmurs Lungs: CTA bilaterally, no wheezes, rhonchi, or rales Abdomen: +bs, soft, non tender, non  distended, no masses, no hepatomegaly, no splenomegaly Musculoskeletal: nontender, no swelling, no obvious deformity Extremities: no edema, no cyanosis, no clubbing Pulses: 2+ symmetric, upper and lower extremities, normal cap refill Neurological: alert, oriented x 3, CN2-12 intact, strength normal upper extremities and lower extremities, sensation normal throughout, DTRs 2+ throughout, no cerebellar signs, gait normal Psychiatric: normal affect, behavior normal, pleasant  Breast: nontender, no masses or lumps, no skin changes, no nipple discharge or inversion, no axillary lymphadenopathy Gyn: declined per patient Rectal: declined per patient   Assessment:   Hypertension: Continue medication, monitor blood pressure at home.  Continue DASH diet. Cholesterol: Continue diet and exercise. Check cholesterol.  Diabetes-Continue diet and exercise. Check A1C Vitamin D Def- check level and continue medications.  Fatigue/chest pressure- ?GERD vs CP- patient refuses Cardiolite although I have suggested that. She will try prilosec samples and if not better she will get stress test or go to ER.    Plan:   During the course of the visit the patient was educated and counseled about appropriate screening and preventive services including:    Bone densitometry screening  Diabetes screening  Glaucoma screening  Screening recommendations, referrals:  Vaccinations: Tdap vaccine no - up to date  Influenza vaccine no- up to date  Pneumococcal vaccine no- up to date  Shingles vaccine no- up to date  Hep B vaccine no- up to date   Nutrition assessed and recommended  Colonoscopy yes- recommended but declined.  Mammogram yes Pap smear no  Pelvic exam no Recommended yearly ophthalmology/optometry visit for glaucoma screening and checkup Recommended yearly dental visit for hygiene and checkup Advanced directives - yes  Conditions/risks identified: DEXA needed- will scheudle Fatigue/chest  pressure- ?GERD vs CP- patient refuses Cardiolite although I have suggested that. She will try prilosec samples and if not better she will get stress test or go to ER.   Suggest colonoscopy- patient declines, understands the risk of not getting on. Long discussion.  Bradycardia- decrease atenolol to 1/2 pill and increase lisinopril to 40mg  qd Polypharmacy- discussed medications, and trying to decrease them.  Overweight- diet, nutrition and exercise discussed.  Low fall risk   Medicare Attestation I have personally reviewed: The patient's medical and social history Their use of alcohol, tobacco or illicit drugs Their current medications and supplements The patient's functional ability including ADLs,fall risks, home safety risks, cognitive, and hearing and visual impairment Diet and physical activities Evidence for depression or mood disorders  The patient's weight, height, BMI, and visual acuity have been recorded in the chart.  I have made referrals, counseling, and provided education to the patient based on review of the above and I have provided the patient with a written personalized care plan for preventive services.     Vicie Mutters, PA-C   09/08/2013

## 2013-09-08 NOTE — Patient Instructions (Addendum)
Bradycardia- cut atenolol in half- take at night  If BP is above 140/80 then increase lisinopril to 40mg  daily.     Bad carbs also include fruit juice, alcohol, and sweet tea. These are empty calories that do not signal to your brain that you are full.   Please remember the good carbs are still carbs which convert into sugar. So please measure them out no more than 1/2-1 cup of rice, oatmeal, pasta, and beans.  Veggies are however free foods! Pile them on.   I like lean protein at every meal such as chicken, [place], pork chops, cottage cheese, etc. Just do not fry these meats and please center your meal around vegetable, the meats should be a side dish.   No all fruit is created equal. Please see the list below, the fruit at the bottom is higher in sugars than the fruit at the top

## 2013-09-09 LAB — LIPID PANEL
CHOL/HDL RATIO: 2.9 ratio
Cholesterol: 159 mg/dL (ref 0–200)
HDL: 55 mg/dL (ref >39)
LDL Cholesterol: 81 mg/dL (ref 0–99)
Triglycerides: 117 mg/dL (ref <150)
VLDL: 23 mg/dL (ref 0–40)

## 2013-09-09 LAB — HEPATIC FUNCTION PANEL
ALBUMIN: 4.4 g/dL (ref 3.5–5.2)
ALT: 17 U/L (ref 0–35)
AST: 19 U/L (ref 0–37)
Alkaline Phosphatase: 43 U/L (ref 39–117)
BILIRUBIN TOTAL: 0.7 mg/dL (ref 0.2–1.2)
Bilirubin, Direct: 0.1 mg/dL (ref 0.0–0.3)
Indirect Bilirubin: 0.6 mg/dL (ref 0.2–1.2)
Total Protein: 6.8 g/dL (ref 6.0–8.3)

## 2013-09-09 LAB — BASIC METABOLIC PANEL WITH GFR
BUN: 19 mg/dL (ref 6–23)
CALCIUM: 9.6 mg/dL (ref 8.4–10.5)
CO2: 30 mEq/L (ref 19–32)
Chloride: 100 mEq/L (ref 96–112)
Creat: 0.78 mg/dL (ref 0.50–1.10)
GFR, EST NON AFRICAN AMERICAN: 74 mL/min
GFR, Est African American: 85 mL/min
Glucose, Bld: 92 mg/dL (ref 70–99)
Potassium: 3.9 mEq/L (ref 3.5–5.3)
Sodium: 137 mEq/L (ref 135–145)

## 2013-09-09 LAB — MAGNESIUM: Magnesium: 2 mg/dL (ref 1.5–2.5)

## 2013-09-09 LAB — TSH: TSH: 1.443 u[IU]/mL (ref 0.350–4.500)

## 2013-10-05 ENCOUNTER — Other Ambulatory Visit: Payer: Self-pay | Admitting: Physician Assistant

## 2013-11-03 ENCOUNTER — Other Ambulatory Visit: Payer: Self-pay | Admitting: Internal Medicine

## 2013-11-16 ENCOUNTER — Other Ambulatory Visit: Payer: Self-pay | Admitting: Physician Assistant

## 2013-12-08 ENCOUNTER — Ambulatory Visit: Payer: Self-pay | Admitting: Internal Medicine

## 2013-12-15 ENCOUNTER — Encounter: Payer: Self-pay | Admitting: Internal Medicine

## 2013-12-15 ENCOUNTER — Ambulatory Visit (INDEPENDENT_AMBULATORY_CARE_PROVIDER_SITE_OTHER): Payer: Medicare Other | Admitting: Internal Medicine

## 2013-12-15 VITALS — BP 122/72 | HR 60 | Temp 97.9°F | Resp 16 | Ht 65.0 in | Wt 159.8 lb

## 2013-12-15 DIAGNOSIS — I10 Essential (primary) hypertension: Secondary | ICD-10-CM | POA: Diagnosis not present

## 2013-12-15 DIAGNOSIS — E785 Hyperlipidemia, unspecified: Secondary | ICD-10-CM

## 2013-12-15 DIAGNOSIS — Z79899 Other long term (current) drug therapy: Secondary | ICD-10-CM

## 2013-12-15 DIAGNOSIS — E559 Vitamin D deficiency, unspecified: Secondary | ICD-10-CM | POA: Diagnosis not present

## 2013-12-15 DIAGNOSIS — E1129 Type 2 diabetes mellitus with other diabetic kidney complication: Secondary | ICD-10-CM

## 2013-12-15 LAB — CBC WITH DIFFERENTIAL/PLATELET
Basophils Absolute: 0.1 10*3/uL (ref 0.0–0.1)
Basophils Relative: 1 % (ref 0–1)
EOS ABS: 0.1 10*3/uL (ref 0.0–0.7)
EOS PCT: 2 % (ref 0–5)
HEMATOCRIT: 42 % (ref 36.0–46.0)
Hemoglobin: 14.2 g/dL (ref 12.0–15.0)
LYMPHS ABS: 2.4 10*3/uL (ref 0.7–4.0)
LYMPHS PCT: 38 % (ref 12–46)
MCH: 32.7 pg (ref 26.0–34.0)
MCHC: 33.8 g/dL (ref 30.0–36.0)
MCV: 96.8 fL (ref 78.0–100.0)
MONO ABS: 0.4 10*3/uL (ref 0.1–1.0)
MONOS PCT: 6 % (ref 3–12)
Neutro Abs: 3.4 10*3/uL (ref 1.7–7.7)
Neutrophils Relative %: 53 % (ref 43–77)
Platelets: 206 10*3/uL (ref 150–400)
RBC: 4.34 MIL/uL (ref 3.87–5.11)
RDW: 13.9 % (ref 11.5–15.5)
WBC: 6.4 10*3/uL (ref 4.0–10.5)

## 2013-12-15 LAB — HEMOGLOBIN A1C
Hgb A1c MFr Bld: 6.7 % — ABNORMAL HIGH (ref <5.7)
Mean Plasma Glucose: 146 mg/dL — ABNORMAL HIGH (ref <117)

## 2013-12-15 NOTE — Patient Instructions (Signed)
 Hypertension As your heart beats, it forces blood through your arteries. This force is your blood pressure. If the pressure is too high, it is called hypertension (HTN) or high blood pressure. HTN is dangerous because you may have it and not know it. High blood pressure may mean that your heart has to work harder to pump blood. Your arteries may be narrow or stiff. The extra work puts you at risk for heart disease, stroke, and other problems.  Blood pressure consists of two numbers, a higher number over a lower, 110/72, for example. It is stated as "110 over 72." The ideal is below 120 for the top number (systolic) and under 80 for the bottom (diastolic). Write down your blood pressure today. You should pay close attention to your blood pressure if you have certain conditions such as:  Heart failure.  Prior heart attack.  Diabetes  Chronic kidney disease.  Prior stroke.  Multiple risk factors for heart disease. To see if you have HTN, your blood pressure should be measured while you are seated with your arm held at the level of the heart. It should be measured at least twice. A one-time elevated blood pressure reading (especially in the Emergency Department) does not mean that you need treatment. There may be conditions in which the blood pressure is different between your right and left arms. It is important to see your caregiver soon for a recheck. Most people have essential hypertension which means that there is not a specific cause. This type of high blood pressure may be lowered by changing lifestyle factors such as:  Stress.  Smoking.  Lack of exercise.  Excessive weight.  Drug/tobacco/alcohol use.  Eating less salt. Most people do not have symptoms from high blood pressure until it has caused damage to the body. Effective treatment can often prevent, delay or reduce that damage. TREATMENT  When a cause has been identified, treatment for high blood pressure is directed at  the cause. There are a large number of medications to treat HTN. These fall into several categories, and your caregiver will help you select the medicines that are best for you. Medications may have side effects. You should review side effects with your caregiver. If your blood pressure stays high after you have made lifestyle changes or started on medicines,   Your medication(s) may need to be changed.  Other problems may need to be addressed.  Be certain you understand your prescriptions, and know how and when to take your medicine.  Be sure to follow up with your caregiver within the time frame advised (usually within two weeks) to have your blood pressure rechecked and to review your medications.  If you are taking more than one medicine to lower your blood pressure, make sure you know how and at what times they should be taken. Taking two medicines at the same time can result in blood pressure that is too low. SEEK IMMEDIATE MEDICAL CARE IF:  You develop a severe headache, blurred or changing vision, or confusion.  You have unusual weakness or numbness, or a faint feeling.  You have severe chest or abdominal pain, vomiting, or breathing problems. MAKE SURE YOU:   Understand these instructions.  Will watch your condition.  Will get help right away if you are not doing well or get worse.   Diabetes and Exercise Exercising regularly is important. It is not just about losing weight. It has many health benefits, such as:  Improving your overall fitness, flexibility, and   endurance.  Increasing your bone density.  Helping with weight control.  Decreasing your body fat.  Increasing your muscle strength.  Reducing stress and tension.  Improving your overall health. People with diabetes who exercise gain additional benefits because exercise:  Reduces appetite.  Improves the body's use of blood sugar (glucose).  Helps lower or control blood glucose.  Decreases blood  pressure.  Helps control blood lipids (such as cholesterol and triglycerides).  Improves the body's use of the hormone insulin by:  Increasing the body's insulin sensitivity.  Reducing the body's insulin needs.  Decreases the risk for heart disease because exercising:  Lowers cholesterol and triglycerides levels.  Increases the levels of good cholesterol (such as high-density lipoproteins [HDL]) in the body.  Lowers blood glucose levels. YOUR ACTIVITY PLAN  Choose an activity that you enjoy and set realistic goals. Your health care Mckinsley Koelzer or diabetes educator can help you make an activity plan that works for you. You can break activities into 2 or 3 sessions throughout the day. Doing so is as good as one long session. Exercise ideas include:  Taking the dog for a walk.  Taking the stairs instead of the elevator.  Dancing to your favorite song.  Doing your favorite exercise with a friend. RECOMMENDATIONS FOR EXERCISING WITH TYPE 1 OR TYPE 2 DIABETES   Check your blood glucose before exercising. If blood glucose levels are greater than 240 mg/dL, check for urine ketones. Do not exercise if ketones are present.  Avoid injecting insulin into areas of the body that are going to be exercised. For example, avoid injecting insulin into:  The arms when playing tennis.  The legs when jogging.  Keep a record of:  Food intake before and after you exercise.  Expected peak times of insulin action.  Blood glucose levels before and after you exercise.  The type and amount of exercise you have done.  Review your records with your health care Derian Dimalanta. Your health care Yukari Flax will help you to develop guidelines for adjusting food intake and insulin amounts before and after exercising.  If you take insulin or oral hypoglycemic agents, watch for signs and symptoms of hypoglycemia. They include:  Dizziness.  Shaking.  Sweating.  Chills.  Confusion.  Drink plenty of water  while you exercise to prevent dehydration or heat stroke. Body water is lost during exercise and must be replaced.  Talk to your health care Cherryl Babin before starting an exercise program to make sure it is safe for you. Remember, almost any type of activity is better than none.    Cholesterol Cholesterol is a white, waxy, fat-like protein needed by your body in small amounts. The liver makes all the cholesterol you need. It is carried from the liver by the blood through the blood vessels. Deposits (plaque) may build up on blood vessel walls. This makes the arteries narrower and stiffer. Plaque increases the risk for heart attack and stroke. You cannot feel your cholesterol level even if it is very high. The only way to know is by a blood test to check your lipid (fats) levels. Once you know your cholesterol levels, you should keep a record of the test results. Work with your caregiver to to keep your levels in the desired range. WHAT THE RESULTS MEAN:  Total cholesterol is a rough measure of all the cholesterol in your blood.  LDL is the so-called bad cholesterol. This is the type that deposits cholesterol in the walls of the arteries. You want this   level to be low.  HDL is the good cholesterol because it cleans the arteries and carries the LDL away. You want this level to be high.  Triglycerides are fat that the body can either burn for energy or store. High levels are closely linked to heart disease. DESIRED LEVELS:  Total cholesterol below 200.  LDL below 100 for people at risk, below 70 for very high risk.  HDL above 50 is good, above 60 is best.  Triglycerides below 150. HOW TO LOWER YOUR CHOLESTEROL:  Diet.  Choose fish or white meat chicken and turkey, roasted or baked. Limit fatty cuts of red meat, fried foods, and processed meats, such as sausage and lunch meat.  Eat lots of fresh fruits and vegetables. Choose whole grains, beans, pasta, potatoes and cereals.  Use only  small amounts of olive, corn or canola oils. Avoid butter, mayonnaise, shortening or palm kernel oils. Avoid foods with trans-fats.  Use skim/nonfat milk and low-fat/nonfat yogurt and cheeses. Avoid whole milk, cream, ice cream, egg yolks and cheeses. Healthy desserts include angel food cake, ginger snaps, animal crackers, hard candy, popsicles, and low-fat/nonfat frozen yogurt. Avoid pastries, cakes, pies and cookies.  Exercise.  A regular program helps decrease LDL and raises HDL.  Helps with weight control.  Do things that increase your activity level like gardening, walking, or taking the stairs.  Medication.  May be prescribed by your caregiver to help lowering cholesterol and the risk for heart disease.  You may need medicine even if your levels are normal if you have several risk factors. HOME CARE INSTRUCTIONS   Follow your diet and exercise programs as suggested by your caregiver.  Take medications as directed.  Have blood work done when your caregiver feels it is necessary. MAKE SURE YOU:   Understand these instructions.  Will watch your condition.  Will get help right away if you are not doing well or get worse.      Vitamin D Deficiency Vitamin D is an important vitamin that your body needs. Having too little of it in your body is called a deficiency. A very bad deficiency can make your bones soft and can cause a condition called rickets.  Vitamin D is important to your body for different reasons, such as:   It helps your body absorb 2 minerals called calcium and phosphorus.  It helps make your bones healthy.  It may prevent some diseases, such as diabetes and multiple sclerosis.  It helps your muscles and heart. You can get vitamin D in several ways. It is a natural part of some foods. The vitamin is also added to some dairy products and cereals. Some people take vitamin D supplements. Also, your body makes vitamin D when you are in the sun. It changes the  sun's rays into a form of the vitamin that your body can use. CAUSES   Not eating enough foods that contain vitamin D.  Not getting enough sunlight.  Having certain digestive system diseases that make it hard to absorb vitamin D. These diseases include Crohn's disease, chronic pancreatitis, and cystic fibrosis.  Having a surgery in which part of the stomach or small intestine is removed.  Being obese. Fat cells pull vitamin D out of your blood. That means that obese people may not have enough vitamin D left in their blood and in other body tissues.  Having chronic kidney or liver disease. RISK FACTORS Risk factors are things that make you more likely to develop a vitamin   D deficiency. They include:  Being older.  Not being able to get outside very much.  Living in a nursing home.  Having had broken bones.  Having weak or thin bones (osteoporosis).  Having a disease or condition that changes how your body absorbs vitamin D.  Having dark skin.  Some medicines such as seizure medicines or steroids.  Being overweight or obese. SYMPTOMS Mild cases of vitamin D deficiency may not have any symptoms. If you have a very bad case, symptoms may include:  Bone pain.  Muscle pain.  Falling often.  Broken bones caused by a minor injury, due to osteoporosis. DIAGNOSIS A blood test is the best way to tell if you have a vitamin D deficiency. TREATMENT Vitamin D deficiency can be treated in different ways. Treatment for vitamin D deficiency depends on what is causing it. Options include:  Taking vitamin D supplements.  Taking a calcium supplement. Your caregiver will suggest what dose is best for you. HOME CARE INSTRUCTIONS  Take any supplements that your caregiver prescribes. Follow the directions carefully. Take only the suggested amount.  Have your blood tested 2 months after you start taking supplements.  Eat foods that contain vitamin D. Healthy choices  include:  Fortified dairy products, cereals, or juices. Fortified means vitamin D has been added to the food. Check the label on the package to be sure.  Fatty fish like salmon or trout.  Eggs.  Oysters.  Do not use a tanning bed.  Keep your weight at a healthy level. Lose weight if you need to.  Keep all follow-up appointments. Your caregiver will need to perform blood tests to make sure your vitamin D deficiency is going away. SEEK MEDICAL CARE IF:  You have any questions about your treatment.  You continue to have symptoms of vitamin D deficiency.  You have nausea or vomiting.  You are constipated.  You feel confused.  You have severe abdominal or back pain. MAKE SURE YOU:  Understand these instructions.  Will watch your condition.  Will get help right away if you are not doing well or get worse.   

## 2013-12-15 NOTE — Progress Notes (Signed)
Patient ID: Ruth Delgado, female   DOB: January 13, 1935, 79 y.o.   MRN: [id number]    This very nice 79 y.o. MWF presents for 3 month follow up with Hypertension, Hyperlipidemia, T2 NIDDM w/Stage 2 CKD and Vitamin D Deficiency.    HTN predates since 49. BP has been controlled at home. Today's BP: 122/72 mmHg. Patient denies any cardiac type chest pain, palpitations, dyspnea/orthopnea/PND, dizziness, claudication, or dependent edema.   Hyperlipidemia is controlled with diet & meds. Last lipids in Feb 2014 as below were at goal.. Patient denies myalgias or other med SE's.  Lab Results  Component Value Date   CHOL 159 09/08/2013   HDL 55 09/08/2013   LDLCALC 81 09/08/2013   TRIG 117 09/08/2013   CHOLHDL 2.9 09/08/2013    Also, the patient has history of T2 NIDDM since 2007 with A1c 8.7% and has controlled it with diet keeping her A1c's in the low 6's.  She also w/Stage 2 CKD (GFR 72 ml/min) and last A1c was up slightly at 6.8% in Nov 2014. Patient denies any symptoms of reactive hypoglycemia, diabetic polys, paresthesias or visual blurring.   Further, Patient has history of Vitamin D Deficiency of 29 in 2009 wand  last vitamin D was 57 in Aug 2014. Patient supplements vitamin D without any suspected side-effects.   Medication List     Allopurinol 300 MG tablet  Commonly known as:  ZYLOPRIM  Take 300 mg by mouth daily.     aspirin EC 81 MG tablet  Take 81 mg by mouth daily.     atenolol 50 MG tablet  Commonly known as:  TENORMIN  Take 50 mg by mouth daily.     cholecalciferol 1000 UNITS tablet  Commonly known as:  VITAMIN D  Take 5,000 Units by mouth every other day.     citalopram 40 MG tablet  Commonly known as:  CELEXA  TAKE ONE TABLET BY MOUTH EVERY DAY FOR MOOD     diltiazem 240 MG 24 hr capsule  Commonly known as:  CARDIZEM CD  TAKE ONE CAPSULE BY MOUTH ONCE DAILY FOR BLOOD PRESSURE     fish oil-omega-3 fatty acids 1000 MG capsule  Take 1 g by mouth daily.     indapamide  1.25 MG tablet  Commonly known as:  LOZOL  Take 1.25 mg by mouth every morning. Takes 1 BID     lisinopril 40 MG tablet  Commonly known as:  PRINIVIL,ZESTRIL  Take 1 tablet (40 mg total) by mouth daily.     Magnesium 250 MG Tabs  Take 250 mg by mouth daily.     pravastatin 40 MG tablet  Commonly known as:  PRAVACHOL  TAKE ONE TABLET BY MOUTH ONCE DAILY     ranitidine 300 MG tablet  Commonly known as:  ZANTAC  TAKE ONE TABLET BY MOUTH EVERY DAY AS NEEDED FOR  ACID  INDIGESTION     SUPER B COMPLEX PO  Take by mouth daily.       Allergies  Allergen Reactions  . Buspar [Buspirone]     dysphoria  . Lipitor [Atorvastatin]     Myalgias  . Loop Diuretics     Fatigue/weakness  . Metformin And Related     Gas/bloating  . Nsaids     GI upset  . Sulfa Antibiotics Other (See Comments)    Reaction=throat itching  . Sulfonamide Derivatives     PMHx:   Past Medical History  Diagnosis Date  . A-fib   .  Hypertension   . Hyperlipemia   . Arthritis   . Gout   . Anxiety   . Diabetes mellitus    FHx:    Reviewed / unchanged  SHx:    Reviewed / unchanged   Systems Review: Constitutional: Denies fever, chills, wt changes, headaches, insomnia, fatigue, night sweats, change in appetite. Eyes: Denies redness, blurred vision, diplopia, discharge, itchy, watery eyes.  ENT: Denies discharge, congestion, post nasal drip, epistaxis, sore throat, earache, hearing loss, dental pain, tinnitus, vertigo, sinus pain, snoring.  CV: Denies chest pain, palpitations, irregular heartbeat, syncope, dyspnea, diaphoresis, orthopnea, PND, claudication or edema. Respiratory: denies cough, dyspnea, DOE, pleurisy, hoarseness, laryngitis, wheezing.  Gastrointestinal: Denies dysphagia, odynophagia, heartburn, reflux, water brash, abdominal pain or cramps, nausea, vomiting, bloating, diarrhea, constipation, hematemesis, melena, hematochezia  or hemorrhoids. Genitourinary: Denies dysuria, frequency,  urgency, nocturia, hesitancy, discharge, hematuria or flank pain. Musculoskeletal: Denies arthralgias, myalgias, stiffness, jt. swelling, pain, limping or strain/sprain.  Skin: Denies pruritus, rash, hives, warts, acne, eczema or change in skin lesion(s). Neuro: No weakness, tremor, incoordination, spasms, paresthesia or pain. Psychiatric: Denies confusion, memory loss or sensory loss. Endo: Denies change in weight, skin or hair change.  Heme/Lymph: No excessive bleeding, bruising or enlarged lymph nodes.  Exam:  BP 122/72  Pulse 60  Temp 97.9 F  Resp 16  Ht 5\' 5"    Wt 159 lb 12.8 oz   BMI 26.59 kg/m2  Appears well nourished - in no distress. Eyes: PERRLA, EOMs, conjunctiva no swelling or erythema. Sinuses: No frontal/maxillary tenderness ENT/Mouth: EAC's clear, TM's nl w/o erythema, bulging. Nares clear w/o erythema, swelling, exudates. Oropharynx clear without erythema or exudates. Oral hygiene is good. Tongue normal, non obstructing. Hearing intact.  Neck: Supple. Thyroid nl. Car 2+/2+ without bruits, nodes or JVD. Chest: Respirations nl with BS clear & equal w/o rales, rhonchi, wheezing or stridor.  Cor: Heart sounds normal w/ regular rate and rhythm without sig. murmurs, gallops, clicks, or rubs. Peripheral pulses normal and equal  without edema.  Lymphatics: Unremarkable.  Musculoskeletal: Full ROM all peripheral extremities, joint stability, 5/5 strength, and normal gait.  Skin: Warm, dry without exposed rashes, lesions or ecchymosis apparent.  Neuro: Cranial nerves intact, reflexes equal bilaterally. Sensory-motor testing grossly intact. Tendon reflexes grossly intact.  Pysch: Alert & oriented x 3. Insight and judgement nl & appropriate. No ideations.  Assessment and Plan:  1. Hypertension - Continue monitor blood pressure at home. Continue diet/meds same.  2. Hyperlipidemia - Continue diet/meds, exercise,& lifestyle modifications. Continue monitor periodic  cholesterol/liver & renal functions   3. T2 NIDDM w/Stage 2 CKD - continue recommend prudent low glycemic diet, weight control, regular exercise, diabetic monitoring and periodic eye exams.  4. Vitamin D Deficiency - Continue supplementation.  Recommended regular exercise, BP monitoring, weight control, and discussed med and SE's. Recommended labs to assess and monitor clinical status. Further disposition pending results of labs.

## 2013-12-16 LAB — BASIC METABOLIC PANEL WITH GFR
BUN: 25 mg/dL — AB (ref 6–23)
CALCIUM: 9.6 mg/dL (ref 8.4–10.5)
CHLORIDE: 99 meq/L (ref 96–112)
CO2: 30 meq/L (ref 19–32)
CREATININE: 0.9 mg/dL (ref 0.50–1.10)
GFR, Est African American: 72 mL/min
GFR, Est Non African American: 62 mL/min
GLUCOSE: 100 mg/dL — AB (ref 70–99)
Potassium: 3.6 mEq/L (ref 3.5–5.3)
Sodium: 142 mEq/L (ref 135–145)

## 2013-12-16 LAB — HEPATIC FUNCTION PANEL
ALBUMIN: 4.3 g/dL (ref 3.5–5.2)
ALK PHOS: 46 U/L (ref 39–117)
ALT: 16 U/L (ref 0–35)
AST: 20 U/L (ref 0–37)
Bilirubin, Direct: 0.1 mg/dL (ref 0.0–0.3)
Indirect Bilirubin: 0.5 mg/dL (ref 0.2–1.2)
Total Bilirubin: 0.6 mg/dL (ref 0.2–1.2)
Total Protein: 6.9 g/dL (ref 6.0–8.3)

## 2013-12-16 LAB — VITAMIN D 25 HYDROXY (VIT D DEFICIENCY, FRACTURES): VIT D 25 HYDROXY: 67 ng/mL (ref 30–89)

## 2013-12-16 LAB — LIPID PANEL
Cholesterol: 163 mg/dL (ref 0–200)
HDL: 53 mg/dL (ref >39)
LDL CALC: 84 mg/dL (ref 0–99)
TRIGLYCERIDES: 132 mg/dL (ref <150)
Total CHOL/HDL Ratio: 3.1 Ratio
VLDL: 26 mg/dL (ref 0–40)

## 2013-12-16 LAB — MAGNESIUM: Magnesium: 1.9 mg/dL (ref 1.5–2.5)

## 2013-12-16 LAB — TSH: TSH: 1.173 u[IU]/mL (ref 0.350–4.500)

## 2013-12-16 LAB — INSULIN, FASTING: Insulin fasting, serum: 17 u[IU]/mL (ref 3–28)

## 2014-01-04 ENCOUNTER — Other Ambulatory Visit: Payer: Self-pay | Admitting: Physician Assistant

## 2014-01-06 DIAGNOSIS — Z1231 Encounter for screening mammogram for malignant neoplasm of breast: Secondary | ICD-10-CM | POA: Diagnosis not present

## 2014-02-01 ENCOUNTER — Other Ambulatory Visit: Payer: Self-pay | Admitting: Physician Assistant

## 2014-02-03 ENCOUNTER — Other Ambulatory Visit: Payer: Self-pay | Admitting: Internal Medicine

## 2014-02-15 ENCOUNTER — Other Ambulatory Visit: Payer: Self-pay | Admitting: Physician Assistant

## 2014-02-15 ENCOUNTER — Other Ambulatory Visit: Payer: Self-pay | Admitting: Internal Medicine

## 2014-02-23 DIAGNOSIS — E7800 Pure hypercholesterolemia, unspecified: Secondary | ICD-10-CM | POA: Diagnosis not present

## 2014-02-23 DIAGNOSIS — I10 Essential (primary) hypertension: Secondary | ICD-10-CM | POA: Diagnosis not present

## 2014-02-23 DIAGNOSIS — IMO0001 Reserved for inherently not codable concepts without codable children: Secondary | ICD-10-CM | POA: Diagnosis not present

## 2014-02-24 ENCOUNTER — Other Ambulatory Visit: Payer: Self-pay | Admitting: Internal Medicine

## 2014-03-17 ENCOUNTER — Encounter: Payer: Self-pay | Admitting: Physician Assistant

## 2014-03-17 ENCOUNTER — Ambulatory Visit (INDEPENDENT_AMBULATORY_CARE_PROVIDER_SITE_OTHER): Payer: Medicare Other | Admitting: Physician Assistant

## 2014-03-17 VITALS — BP 120/78 | HR 60 | Temp 98.6°F | Resp 16 | Ht 65.0 in | Wt 164.0 lb

## 2014-03-17 DIAGNOSIS — E1129 Type 2 diabetes mellitus with other diabetic kidney complication: Secondary | ICD-10-CM

## 2014-03-17 DIAGNOSIS — E559 Vitamin D deficiency, unspecified: Secondary | ICD-10-CM

## 2014-03-17 DIAGNOSIS — I10 Essential (primary) hypertension: Secondary | ICD-10-CM | POA: Diagnosis not present

## 2014-03-17 DIAGNOSIS — F411 Generalized anxiety disorder: Secondary | ICD-10-CM

## 2014-03-17 DIAGNOSIS — I480 Paroxysmal atrial fibrillation: Secondary | ICD-10-CM

## 2014-03-17 DIAGNOSIS — E785 Hyperlipidemia, unspecified: Secondary | ICD-10-CM | POA: Diagnosis not present

## 2014-03-17 DIAGNOSIS — Z79899 Other long term (current) drug therapy: Secondary | ICD-10-CM

## 2014-03-17 DIAGNOSIS — F419 Anxiety disorder, unspecified: Secondary | ICD-10-CM

## 2014-03-17 DIAGNOSIS — M109 Gout, unspecified: Secondary | ICD-10-CM

## 2014-03-17 LAB — LIPID PANEL
CHOL/HDL RATIO: 3.3 ratio
Cholesterol: 171 mg/dL (ref 0–200)
HDL: 52 mg/dL (ref >39)
LDL CALC: 87 mg/dL (ref 0–99)
Triglycerides: 158 mg/dL — ABNORMAL HIGH (ref <150)
VLDL: 32 mg/dL (ref 0–40)

## 2014-03-17 LAB — BASIC METABOLIC PANEL WITH GFR
BUN: 24 mg/dL — ABNORMAL HIGH (ref 6–23)
CHLORIDE: 102 meq/L (ref 96–112)
CO2: 33 meq/L — AB (ref 19–32)
Calcium: 9.6 mg/dL (ref 8.4–10.5)
Creat: 0.82 mg/dL (ref 0.50–1.10)
GFR, Est African American: 80 mL/min
GFR, Est Non African American: 69 mL/min
Glucose, Bld: 110 mg/dL — ABNORMAL HIGH (ref 70–99)
POTASSIUM: 4.8 meq/L (ref 3.5–5.3)
SODIUM: 141 meq/L (ref 135–145)

## 2014-03-17 LAB — CBC WITH DIFFERENTIAL/PLATELET
BASOS ABS: 0.1 10*3/uL (ref 0.0–0.1)
Basophils Relative: 1 % (ref 0–1)
Eosinophils Absolute: 0.1 10*3/uL (ref 0.0–0.7)
Eosinophils Relative: 2 % (ref 0–5)
HCT: 42.4 % (ref 36.0–46.0)
Hemoglobin: 14.8 g/dL (ref 12.0–15.0)
LYMPHS ABS: 2.7 10*3/uL (ref 0.7–4.0)
LYMPHS PCT: 39 % (ref 12–46)
MCH: 32.7 pg (ref 26.0–34.0)
MCHC: 34.9 g/dL (ref 30.0–36.0)
MCV: 93.8 fL (ref 78.0–100.0)
Monocytes Absolute: 0.3 10*3/uL (ref 0.1–1.0)
Monocytes Relative: 5 % (ref 3–12)
NEUTROS ABS: 3.7 10*3/uL (ref 1.7–7.7)
Neutrophils Relative %: 53 % (ref 43–77)
PLATELETS: 191 10*3/uL (ref 150–400)
RBC: 4.52 MIL/uL (ref 3.87–5.11)
RDW: 14.1 % (ref 11.5–15.5)
WBC: 6.9 10*3/uL (ref 4.0–10.5)

## 2014-03-17 LAB — HEPATIC FUNCTION PANEL
ALBUMIN: 4.4 g/dL (ref 3.5–5.2)
ALK PHOS: 46 U/L (ref 39–117)
ALT: 17 U/L (ref 0–35)
AST: 18 U/L (ref 0–37)
BILIRUBIN INDIRECT: 0.4 mg/dL (ref 0.2–1.2)
BILIRUBIN TOTAL: 0.5 mg/dL (ref 0.2–1.2)
Bilirubin, Direct: 0.1 mg/dL (ref 0.0–0.3)
Total Protein: 6.9 g/dL (ref 6.0–8.3)

## 2014-03-17 LAB — MAGNESIUM: MAGNESIUM: 1.9 mg/dL (ref 1.5–2.5)

## 2014-03-17 LAB — TSH: TSH: 1.379 u[IU]/mL (ref 0.350–4.500)

## 2014-03-17 NOTE — Patient Instructions (Signed)

## 2014-03-17 NOTE — Progress Notes (Signed)
Assessment and Plan:  1. Essential hypertension Controlled, continue meds  2. T2 NIDDM w/Stage 2 CKD (GFR 72 ml/min) Discussed general issues about diabetes pathophysiology and management., Educational material distributed., Suggested low cholesterol diet., Encouraged aerobic exercise., Discussed foot care., Reminded to get yearly retinal exam.  3. Anxiety Remission on celexa  4. Encounter for long-term (current) use of other medications Check Mag  5. Hyperlipemia Check lipids  6. Vitamin D Deficiency Check level  7. Gout without tophus, unspecified cause, unspecified chronicity, unspecified site controlled  8. Paroxysmal atrial fibrillation Continue BASA, NSR with medications.    Continue diet and meds as discussed. Further disposition pending results of labs.  HPI 79 y.o. female  presents for 3 month follow up with hypertension, hyperlipidemia, prediabetes and vitamin D. Her blood pressure has been controlled at home, today their BP is BP: 120/78 mmHg She does workout. She denies chest pain, shortness of breath, dizziness.  She is on cholesterol medication and denies myalgias. Her cholesterol is at goal. The cholesterol last visit was:   Lab Results  Component Value Date   CHOL 163 12/15/2013   HDL 53 12/15/2013   LDLCALC 84 12/15/2013   TRIG 132 12/15/2013   CHOLHDL 3.1 12/15/2013   She has been working on diet and exercise for diabetes, she is on ACE, and denies paresthesia of the feet, polydipsia and polyuria. Had her A1C done with Dr. Chalmers Cater 3 weeks ago, will request records. Last A1C in the office was:  Lab Results  Component Value Date   HGBA1C 6.7* 12/15/2013   Patient is on Vitamin D supplement.   Lab Results  Component Value Date   VD25OH 67 12/15/2013     She is on celexa for anxiety without depression.  She a history of pAfib without CAD/valvular disease for 20 years, she is on Cardizem which controls rate and rhythm, she is on a bASA but declines ASA due  bleed/fall risk . CHADSVASC2 Score of 5, understands risk but does not want anticoagulation, very rare pAfib.   Current Medications:  Current Outpatient Prescriptions on File Prior to Visit  Medication Sig Dispense Refill  . allopurinol (ZYLOPRIM) 300 MG tablet TAKE ONE TABLET BY MOUTH ONCE DAILY TO  PREVENT  GOUT  90 tablet  0  . aspirin EC 81 MG tablet Take 81 mg by mouth daily.      Marland Kitchen atenolol (TENORMIN) 50 MG tablet Take 50 mg by mouth daily.      . B Complex-C (SUPER B COMPLEX PO) Take by mouth daily.      . cholecalciferol (VITAMIN D) 1000 UNITS tablet Take 5,000 Units by mouth every other day.       . citalopram (CELEXA) 40 MG tablet TAKE ONE TABLET BY MOUTH ONCE DAILY FOR  MOOD  90 tablet  0  . citalopram (CELEXA) 40 MG tablet TAKE ONE TABLET BY MOUTH ONCE DAILY FOR  MOOD  90 tablet  2  . diltiazem (CARDIZEM CD) 240 MG 24 hr capsule TAKE ONE CAPSULE BY MOUTH ONCE DAILY FOR BLOOD PRESSURE  90 capsule  1  . fish oil-omega-3 fatty acids 1000 MG capsule Take 1 g by mouth daily.      . indapamide (LOZOL) 1.25 MG tablet TAKE ONE TABLET BY MOUTH TWICE DAILY FOR BLOOD PRESSURE AND FLUID  180 tablet  1  . lisinopril (PRINIVIL,ZESTRIL) 40 MG tablet Take 1 tablet (40 mg total) by mouth daily.  90 tablet  1  . Magnesium 250 MG TABS  Take 250 mg by mouth daily.      . pravastatin (PRAVACHOL) 40 MG tablet TAKE ONE TABLET BY MOUTH ONCE DAILY  90 tablet  0  . ranitidine (ZANTAC) 300 MG tablet TAKE ONE TABLET BY MOUTH EVERY DAY AS NEEDED FOR  ACID  INDIGESTION  90 tablet  0   No current facility-administered medications on file prior to visit.   Medical History:  Past Medical History  Diagnosis Date  . A-fib   . Hypertension   . Hyperlipemia   . Arthritis   . Gout   . Anxiety   . Diabetes mellitus    Allergies:  Allergies  Allergen Reactions  . Buspar [Buspirone]     dysphoria  . Lipitor [Atorvastatin]     Myalgias  . Loop Diuretics     Fatigue/weakness  . Metformin And Related      Gas/bloating  . Nsaids     GI upset  . Sulfa Antibiotics Other (See Comments)    Reaction=throat itching  . Sulfonamide Derivatives      Review of Systems: [X]  = complains of  [ ]  = denies  General: Fatigue [ ]  Fever [ ]  Chills [ ]  Weakness [ ]   Insomnia [ ]  Eyes: Redness [ ]  Blurred vision [ ]  Diplopia [ ]   ENT: Congestion [ ]  Sinus Pain [ ]  Post Nasal Drip [ ]  Sore Throat [ ]  Earache [ ]   Cardiac: Chest pain/pressure [ ]  SOB [ ]  Orthopnea [ ]   Palpitations [ ]   Paroxysmal nocturnal dyspnea[ ]  Claudication [ ]  Edema [ ]   Pulmonary: Cough [ ]  Wheezing[ ]   SOB [ ]   Snoring [ ]   GI: Nausea [ ]  Vomiting[ ]  Dysphagia[ ]  Heartburn[ ]  Abdominal pain [ ]  Constipation [ ] ; Diarrhea [ ] ; BRBPR [ ]  Melena[ ]  GU: Hematuria[ ]  Dysuria [ ]  Nocturia[ ]  Urgency [ ]   Hesitancy [ ]  Discharge [ ]  Neuro: Headaches[ ]  Vertigo[ ]  Paresthesias[ ]  Spasm [ ]  Speech changes [ ]  Incoordination [ ]   Ortho: Arthritis [ ]  Joint pain [ ]  Muscle pain [ ]  Joint swelling [ ]  Back Pain [ ]  Skin:  Rash [ ]   Pruritis [ ]  Change in skin lesion [ ]   Psych: Depression[ ]  Anxiety[ ]  Confusion [ ]  Memory loss [ ]   Heme/Lypmh: Bleeding [ ]  Bruising [ ]  Enlarged lymph nodes [ ]   Endocrine: Visual blurring [ ]  Paresthesia [ ]  Polyuria [ ]  Polydypsea [ ]    Heat/cold intolerance [ ]  Hypoglycemia [ ]   Family history- Review and unchanged Social history- Review and unchanged Physical Exam: BP 120/78  Pulse 60  Temp(Src) 98.6 F (37 C)  Resp 16  Ht 5\' 5"  (1.651 m)  Wt 164 lb (74.39 kg)  BMI 27.29 kg/m2 Wt Readings from Last 3 Encounters:  03/17/14 164 lb (74.39 kg)  12/15/13 159 lb 12.8 oz (72.485 kg)  09/08/13 164 lb (74.39 kg)   General Appearance: Well nourished, in no apparent distress. Eyes: PERRLA, EOMs, conjunctiva no swelling or erythema Sinuses: No Frontal/maxillary tenderness ENT/Mouth: Ext aud canals clear, TMs without erythema, bulging. No erythema, swelling, or exudate on post pharynx.  Tonsils not swollen  or erythematous. Hearing normal.  Neck: Supple, thyroid normal.  Respiratory: Respiratory effort normal, BS equal bilaterally without rales, rhonchi, wheezing or stridor.  Cardio: RRR with no MRGs. Brisk peripheral pulses without edema.  Abdomen: Soft, + BS.  Non tender, no guarding, rebound, hernias, masses. Lymphatics: Non tender without lymphadenopathy.  Musculoskeletal:  Full ROM, 5/5 strength, normal gait.  Skin: Warm, dry without rashes, lesions, ecchymosis.  Neuro: Cranial nerves intact. Normal muscle tone, no cerebellar symptoms. Sensation intact.  Psych: Awake and oriented X 3, normal affect, Insight and Judgment appropriate.    Vicie Mutters 11:08 AM

## 2014-04-06 ENCOUNTER — Other Ambulatory Visit: Payer: Self-pay | Admitting: Internal Medicine

## 2014-04-12 ENCOUNTER — Other Ambulatory Visit: Payer: Self-pay

## 2014-04-12 MED ORDER — ATENOLOL 50 MG PO TABS: 50.0000 mg | ORAL_TABLET | Freq: Every day | ORAL | Status: DC

## 2014-05-19 ENCOUNTER — Other Ambulatory Visit: Payer: Self-pay | Admitting: Internal Medicine

## 2014-05-20 ENCOUNTER — Other Ambulatory Visit: Payer: Self-pay | Admitting: Internal Medicine

## 2014-05-27 ENCOUNTER — Ambulatory Visit (INDEPENDENT_AMBULATORY_CARE_PROVIDER_SITE_OTHER): Payer: Medicare Other | Admitting: Physician Assistant

## 2014-05-27 ENCOUNTER — Encounter: Payer: Self-pay | Admitting: Physician Assistant

## 2014-05-27 VITALS — BP 134/70 | HR 66 | Temp 98.0°F | Resp 18 | Ht 65.0 in | Wt 167.0 lb

## 2014-05-27 DIAGNOSIS — J209 Acute bronchitis, unspecified: Secondary | ICD-10-CM | POA: Diagnosis not present

## 2014-05-27 MED ORDER — PROMETHAZINE-CODEINE 6.25-10 MG/5ML PO SYRP: 5.0000 mL | ORAL_SOLUTION | Freq: Four times a day (QID) | ORAL | Status: DC | PRN

## 2014-05-27 MED ORDER — AZITHROMYCIN 250 MG PO TABS: ORAL_TABLET | ORAL | Status: DC

## 2014-05-27 NOTE — Progress Notes (Signed)
Subjective:    Patient ID: Ruth Delgado, female    DOB: August 20, 1934, 79 y.o.   MRN: [id number]  Cough This is a new problem. Episode onset: 2 weeks ago. The problem has been unchanged. Episode frequency: Every 5 minutes. The cough is productive of sputum (Thick white mucous). Associated symptoms include chest pain, a fever, headaches, a sore throat and sweats. Pertinent negatives include no chills, ear congestion, ear pain, nasal congestion, postnasal drip, rash, rhinorrhea, shortness of breath or wheezing. Associated symptoms comments: Started sore throat, sweats and temp at 97.5 (felt hot) and then thought it was getting better.   However, she is coughing and can't get rid of it.  She only has chest pain due to coughing.. Risk factors for lung disease include smoking/tobacco exposure (no sick contacts.  She used to smoke and quit in 2008.  Her husband smokes in the basement as home.  Recently traveled to [place] Treatments tried: Mucinex and Tylenol. The treatment provided no relief.   Review of Systems  Constitutional: Positive for fever and fatigue. Negative for chills.  HENT: Positive for sore throat. Negative for congestion, ear discharge, ear pain, postnasal drip, rhinorrhea, sinus pressure and trouble swallowing.   Eyes: Negative.   Respiratory: Positive for cough. Negative for chest tightness, shortness of breath, wheezing and stridor.   Cardiovascular: Positive for chest pain.       Chest pain due to cough.  Gastrointestinal: Negative.   Genitourinary: Negative.   Musculoskeletal: Negative.   Skin: Negative.  Negative for rash.  Allergic/Immunologic: Negative.   Neurological: Positive for headaches.  Psychiatric/Behavioral: Negative.    Past Medical History  Diagnosis Date  . A-fib   . Hypertension   . Hyperlipemia   . Arthritis   . Gout   . Anxiety   . Diabetes mellitus    Current Outpatient Prescriptions on File Prior to Visit  Medication Sig Dispense Refill  .  allopurinol (ZYLOPRIM) 300 MG tablet TAKE ONE TABLET BY MOUTH ONCE DAILY TO PREVENT GOUT  90 tablet  0  . aspirin EC 81 MG tablet Take 81 mg by mouth daily.      Marland Kitchen atenolol (TENORMIN) 50 MG tablet Take 1 tablet (50 mg total) by mouth daily.  90 tablet  3  . B Complex-C (SUPER B COMPLEX PO) Take by mouth daily.      . cholecalciferol (VITAMIN D) 1000 UNITS tablet Take 5,000 Units by mouth every other day.       . citalopram (CELEXA) 40 MG tablet TAKE ONE TABLET BY MOUTH ONCE DAILY FOR  MOOD  90 tablet  2  . diltiazem (CARDIZEM CD) 240 MG 24 hr capsule TAKE ONE CAPSULE BY MOUTH ONCE DAILY FOR BLOOD PRESSURE  90 capsule  1  . fish oil-omega-3 fatty acids 1000 MG capsule Take 1 g by mouth daily.      . indapamide (LOZOL) 1.25 MG tablet TAKE ONE TABLET BY MOUTH TWICE DAILY FOR BLOOD PRESSURE AND FLUID  180 tablet  1  . lisinopril (PRINIVIL,ZESTRIL) 20 MG tablet TAKE ONE TABLET BY MOUTH ONCE DAILY FOR BLOOD PRESSURE  90 tablet  99  . Magnesium 250 MG TABS Take 250 mg by mouth daily.      . pravastatin (PRAVACHOL) 40 MG tablet TAKE ONE TABLET BY MOUTH ONCE DAILY  90 tablet  99  . ranitidine (ZANTAC) 300 MG tablet TAKE ONE TABLET BY MOUTH EVERY DAY AS NEEDED FOR  ACID  INDIGESTION  90 tablet  0  No current facility-administered medications on file prior to visit.   Allergies  Allergen Reactions  . Buspar [Buspirone]     dysphoria  . Lipitor [Atorvastatin]     Myalgias  . Loop Diuretics     Fatigue/weakness  . Metformin And Related     Gas/bloating  . Nsaids     GI upset  . Sulfa Antibiotics Other (See Comments)    Reaction=throat itching  . Sulfonamide Derivatives      BP 134/70  Pulse 66  Temp(Src) 98 F (36.7 C) (Temporal)  Resp 18  Ht 5\' 5"  (1.651 m)  Wt 167 lb (75.751 kg)  BMI 27.79 kg/m2  SpO2 96%  Objective:   Physical Exam  Constitutional: She is oriented to person, place, and time. Vital signs are normal. She appears well-developed and well-nourished. She has a sickly  appearance. No distress.  HENT:  Head: Normocephalic.  Right Ear: External ear and ear canal normal. No drainage or swelling. Tympanic membrane is bulging. Tympanic membrane is not injected, not perforated, not erythematous and not retracted. A middle ear effusion is present.  Left Ear: External ear and ear canal normal. No drainage or swelling. Tympanic membrane is bulging. Tympanic membrane is not injected, not perforated, not erythematous and not retracted. A middle ear effusion is present.  Nose: Nose normal. No mucosal edema, rhinorrhea or sinus tenderness. Right sinus exhibits no maxillary sinus tenderness and no frontal sinus tenderness. Left sinus exhibits no maxillary sinus tenderness and no frontal sinus tenderness.  Mouth/Throat: Uvula is midline and mucous membranes are normal. Mucous membranes are not pale and not dry. No trismus in the jaw. No dental abscesses or uvula swelling. Posterior oropharyngeal erythema present. No oropharyngeal exudate, posterior oropharyngeal edema or tonsillar abscesses.  Bulging TM bilaterally with clear fluid behind TM.  TM's are not erythematous or edematous bilaterally.  Turbinates are erythematous bilaterally but not edematous.  Posterior oropharyngeal mildly erythematous.   Eyes: Conjunctivae and lids are normal. Pupils are equal, round, and reactive to light. Right eye exhibits no discharge. Left eye exhibits no discharge. No scleral icterus.  Neck: Trachea normal and normal range of motion. Neck supple. No tracheal tenderness present. No tracheal deviation present.  Cardiovascular: Normal rate, regular rhythm, S1 normal, S2 normal, normal heart sounds, intact distal pulses and normal pulses.  Exam reveals no gallop, no distant heart sounds and no friction rub.   No murmur heard. Pulmonary/Chest: Effort normal and breath sounds normal. No accessory muscle usage or stridor. Not tachypneic and not bradypneic. No respiratory distress. She has no decreased  breath sounds. She has no wheezes. She has no rhonchi. She has no rales. She exhibits no tenderness.  Abdominal: Soft. There is no tenderness. There is no rebound and no guarding.  Musculoskeletal: Normal range of motion.  Lymphadenopathy:       Head (right side): No submental, no submandibular, no tonsillar, no preauricular, no posterior auricular and no occipital adenopathy present.       Head (left side): No submental, no submandibular, no tonsillar, no preauricular, no posterior auricular and no occipital adenopathy present.    She has no cervical adenopathy.       Right: No supraclavicular adenopathy present.       Left: No supraclavicular adenopathy present.  Neurological: She is alert and oriented to person, place, and time.  Skin: Skin is warm, dry and intact. No rash noted.  Psychiatric: She has a normal mood and affect. Her speech is normal and behavior  is normal. Judgment and thought content normal.      Assessment & Plan:  1. Acute bronchitis, unspecified organism - Rest and stay hydrated. - Take Mucinex over the counter to thin out and cough up the thick mucous.  Please follow directions on box. - Take Z-Pak as prescribed.- azithromycin (ZITHROMAX Z-PAK) 250 MG tablet; Take 2 tablets PO on day 1, then take 1 tablet PO QDaily for 4 days.  Dispense: 6 tablet; Refill: 0.  Put on z-pak due to smoking history. - Take Promethazine/Codiene as prescribed for cough.- promethazine-codeine (PHENERGAN WITH CODEINE) 6.25-10 MG/5ML syrup; Take 5 mLs by mouth every 6 (six) hours as needed for cough. Max: 9mL per day  Dispense: 240 mL; Refill: 0 -Take Claritin/Loratadine- 1 tablet PO QDaily.  I will leave this up to you, if you want to take this right now or wait.   - While drinking fluids, pinch and hold your nose close and swallow.  This will help open eustachian tubes.   Every day you lose water through your breath, perspiration, urine and bowel movements. For your body to function properly,  you must replenish its water supply by consuming beverages and foods that contain water. So how much fluid does the average, healthy adult living in a temperate climate need? The Institute of Medicine determined that an adequate intake (AI) for women is about 9 cups (2.2 liters) of total beverages a day. Please try to drink water or flavored water. Try to avoid soda due to it causing dehydration. If you have a hard time with not getting rid of soda, try to drink diet soda.  -Patient states she does not want to take prednisone at this time and will call if not getting better. -Discussed medication effects and SE's.  Patient agreed to treatment plan.  If you get worsening sxs (SOB, CP, difficulty swallowing, fever, nausea or vomiting), then please go to the ER.   If you are not better in 7-10 days, please call the office.  Kacee Sukhu, Stephani Police, PA-C 2:25 PM [facility]

## 2014-05-27 NOTE — Patient Instructions (Addendum)
- Rest and stay hydrated. - Take Mucinex over the counter to thin out and cough up the thick mucous.  Please follow directions on box. - Take Z-Pak as prescribed.- azithromycin (ZITHROMAX Z-PAK) 250 MG tablet; Take 2 tablets PO on day 1, then take 1 tablet PO QDaily for 4 days.  Dispense: 6 tablet; Refill: 0.  Put on z-pak due to smoking history. - Take Promethazine/Codiene as prescribed for cough.- promethazine-codeine (PHENERGAN WITH CODEINE) 6.25-10 MG/5ML syrup; Take 5 mLs by mouth every 6 (six) hours as needed for cough. Max: 69mL per day  Dispense: 240 mL; Refill: 0 -Take Claritin/Loratadine- 1 tablet PO QDaily.  I will leave this up to you, if you want to take this right now or wait.   - While drinking fluids, pinch and hold your nose close and swallow.  This will help open eustachian tubes.   Every day you lose water through your breath, perspiration, urine and bowel movements. For your body to function properly, you must replenish its water supply by consuming beverages and foods that contain water. So how much fluid does the average, healthy adult living in a temperate climate need? The Institute of Medicine determined that an adequate intake (AI) for women is about 9 cups (2.2 liters) of total beverages a day. Please try to drink water or flavored water. Try to avoid soda due to it causing dehydration. If you have a hard time with not getting rid of soda, try to drink diet soda.  -Patient states she does not want to take prednisone at this time and will call if not getting better. -Discussed medication effects and SE's.  Patient agreed to treatment plan.  If you get worsening sxs (SOB, CP, difficulty swallowing, fever, nausea or vomiting), then please go to the ER.   If you are not better in 7-10 days, please call the office.   Acute Bronchitis Bronchitis is when the airways that extend from the windpipe into the lungs get red, puffy, and painful (inflamed). Bronchitis often causes  thick spit (mucus) to develop. This leads to a cough. A cough is the most common symptom of bronchitis. In acute bronchitis, the condition usually begins suddenly and goes away over time (usually in 2 weeks). Smoking, allergies, and asthma can make bronchitis worse. Repeated episodes of bronchitis may cause more lung problems.  Most common cause of Bronchitis is viruses (rhinovirus, coronavirus, RSV).  Therefore, not requiring an antibiotic; as antibiotics only treat bacterial infections.  HOME CARE  Rest.  Drink enough fluids to keep your pee (urine) clear or pale yellow (unless you need to limit fluids as told by your doctor).  Only take over-the-counter or prescription medicines as told by your doctor.  Avoid smoking and secondhand smoke. These can make bronchitis worse. If you are a smoker, think about using nicotine gum or skin patches. Quitting smoking will help your lungs heal faster.  Reduce the chance of getting bronchitis again by:  Washing your hands often.  Avoiding people with cold symptoms.  Trying not to touch your hands to your mouth, nose, or eyes.  Follow up with your doctor as told. GET HELP IF: Your symptoms do not improve after 1 week of treatment. Symptoms include:  Cough.  Fever.  Coughing up thick spit.  Body aches.  Chest congestion.  Chills.  Shortness of breath.  Sore throat. GET HELP RIGHT AWAY IF:   You have an increased fever.  You have chills.  You have severe shortness of breath.  You have bloody thick spit (sputum).  You throw up (vomit) often.  You lose too much body fluid (dehydration).  You have a severe headache.  You faint. MAKE SURE YOU:   Understand these instructions.  Will watch your condition.  Will get help right away if you are not doing well or get worse. Document Released: 01/02/2008 Document Revised: 03/18/2013 Document Reviewed: 01/06/2013 [facility] Patient Information 2015 [place], [place]. This  information is not intended to replace advice given to you by your health care provider. Make sure you discuss any questions you have with your health care provider.

## 2014-06-18 ENCOUNTER — Encounter: Payer: Self-pay | Admitting: Internal Medicine

## 2014-06-22 ENCOUNTER — Ambulatory Visit (INDEPENDENT_AMBULATORY_CARE_PROVIDER_SITE_OTHER): Payer: Medicare Other | Admitting: Internal Medicine

## 2014-06-22 ENCOUNTER — Encounter: Payer: Self-pay | Admitting: Internal Medicine

## 2014-06-22 VITALS — BP 140/78 | HR 64 | Temp 98.1°F | Resp 18 | Ht 64.0 in | Wt 167.6 lb

## 2014-06-22 DIAGNOSIS — N189 Chronic kidney disease, unspecified: Secondary | ICD-10-CM | POA: Diagnosis not present

## 2014-06-22 DIAGNOSIS — Z0001 Encounter for general adult medical examination with abnormal findings: Secondary | ICD-10-CM | POA: Diagnosis not present

## 2014-06-22 DIAGNOSIS — E1122 Type 2 diabetes mellitus with diabetic chronic kidney disease: Secondary | ICD-10-CM

## 2014-06-22 DIAGNOSIS — M109 Gout, unspecified: Secondary | ICD-10-CM | POA: Diagnosis not present

## 2014-06-22 DIAGNOSIS — Z9181 History of falling: Secondary | ICD-10-CM

## 2014-06-22 DIAGNOSIS — I10 Essential (primary) hypertension: Secondary | ICD-10-CM | POA: Diagnosis not present

## 2014-06-22 DIAGNOSIS — M1000 Idiopathic gout, unspecified site: Secondary | ICD-10-CM | POA: Diagnosis not present

## 2014-06-22 DIAGNOSIS — R6889 Other general symptoms and signs: Secondary | ICD-10-CM | POA: Diagnosis not present

## 2014-06-22 DIAGNOSIS — Z1331 Encounter for screening for depression: Secondary | ICD-10-CM

## 2014-06-22 DIAGNOSIS — E785 Hyperlipidemia, unspecified: Secondary | ICD-10-CM | POA: Diagnosis not present

## 2014-06-22 DIAGNOSIS — E1129 Type 2 diabetes mellitus with other diabetic kidney complication: Secondary | ICD-10-CM | POA: Diagnosis not present

## 2014-06-22 DIAGNOSIS — Z1212 Encounter for screening for malignant neoplasm of rectum: Secondary | ICD-10-CM

## 2014-06-22 DIAGNOSIS — E559 Vitamin D deficiency, unspecified: Secondary | ICD-10-CM | POA: Diagnosis not present

## 2014-06-22 DIAGNOSIS — Z79899 Other long term (current) drug therapy: Secondary | ICD-10-CM

## 2014-06-22 LAB — LIPID PANEL
CHOL/HDL RATIO: 3.2 ratio
Cholesterol: 172 mg/dL (ref 0–200)
HDL: 54 mg/dL (ref >39)
LDL Cholesterol: 96 mg/dL (ref 0–99)
TRIGLYCERIDES: 111 mg/dL (ref <150)
VLDL: 22 mg/dL (ref 0–40)

## 2014-06-22 LAB — HEPATIC FUNCTION PANEL
ALT: 21 U/L (ref 0–35)
AST: 25 U/L (ref 0–37)
Albumin: 4.1 g/dL (ref 3.5–5.2)
Alkaline Phosphatase: 43 U/L (ref 39–117)
BILIRUBIN DIRECT: 0.1 mg/dL (ref 0.0–0.3)
BILIRUBIN INDIRECT: 0.4 mg/dL (ref 0.2–1.2)
Total Bilirubin: 0.5 mg/dL (ref 0.2–1.2)
Total Protein: 6.4 g/dL (ref 6.0–8.3)

## 2014-06-22 LAB — BASIC METABOLIC PANEL WITH GFR
BUN: 18 mg/dL (ref 6–23)
CO2: 30 meq/L (ref 19–32)
Calcium: 9.2 mg/dL (ref 8.4–10.5)
Chloride: 101 mEq/L (ref 96–112)
Creat: 0.97 mg/dL (ref 0.50–1.10)
GFR, EST NON AFRICAN AMERICAN: 57 mL/min — AB
GFR, Est African American: 65 mL/min
Glucose, Bld: 114 mg/dL — ABNORMAL HIGH (ref 70–99)
POTASSIUM: 4.4 meq/L (ref 3.5–5.3)
Sodium: 139 mEq/L (ref 135–145)

## 2014-06-22 LAB — CBC WITH DIFFERENTIAL/PLATELET
Basophils Absolute: 0 10*3/uL (ref 0.0–0.1)
Basophils Relative: 0 % (ref 0–1)
EOS PCT: 1 % (ref 0–5)
Eosinophils Absolute: 0.1 10*3/uL (ref 0.0–0.7)
HCT: 42.2 % (ref 36.0–46.0)
HEMOGLOBIN: 14 g/dL (ref 12.0–15.0)
LYMPHS ABS: 2.6 10*3/uL (ref 0.7–4.0)
LYMPHS PCT: 32 % (ref 12–46)
MCH: 32.9 pg (ref 26.0–34.0)
MCHC: 33.2 g/dL (ref 30.0–36.0)
MCV: 99.1 fL (ref 78.0–100.0)
MONOS PCT: 6 % (ref 3–12)
MPV: 10.6 fL (ref 9.4–12.4)
Monocytes Absolute: 0.5 10*3/uL (ref 0.1–1.0)
Neutro Abs: 4.9 10*3/uL (ref 1.7–7.7)
Neutrophils Relative %: 61 % (ref 43–77)
PLATELETS: 184 10*3/uL (ref 150–400)
RBC: 4.26 MIL/uL (ref 3.87–5.11)
RDW: 14.3 % (ref 11.5–15.5)
WBC: 8.1 10*3/uL (ref 4.0–10.5)

## 2014-06-22 LAB — MAGNESIUM: Magnesium: 1.7 mg/dL (ref 1.5–2.5)

## 2014-06-22 LAB — URIC ACID: Uric Acid, Serum: 4.3 mg/dL (ref 2.4–7.0)

## 2014-06-22 LAB — TSH: TSH: 2.839 u[IU]/mL (ref 0.350–4.500)

## 2014-06-22 NOTE — Progress Notes (Addendum)
Patient ID: Ruth Delgado, female   DOB: June 20, 1935, 79 y.o.   MRN: [id number]  Annual Screening Comprehensive Examination  This very nice 79 y.o.female presents for complete physical.  Patient has been followed for HTN, T2_NIDDM  Prediabetes, Hyperlipidemia, and Vitamin D Deficiency.    HTN predates since 74. Patient's BP has been controlled at home and patient denies any cardiac symptoms as chest pain, palpitations, shortness of breath, dizziness or ankle swelling. Today's BP: 140/78 mmHg    Patient's hyperlipidemia is controlled with diet and medications. Patient denies myalgias or other medication SE's. Last lipids were at goal - Total Chol 171; HDL 52; LDL 87; [id number] on 03/17/2014.   Patient has diet controlled T2_NIDDM with CKD2 (GFR 69 ml/min) predating since 2007 and patient denies reactive hypoglycemic symptoms, visual blurring, diabetic polys, or paresthesias. FBG's range betw 110-120 mg%. Last A1c was 6.7 % on 12/15/2013.   Finally, patient has history of Vitamin D Deficiency of 29 in 2008 and last Vitamin D was 67 on 12/15/2013.  Medication Sig  . allopurinol  300 MG tablet TAKE ONE TABLET BY MOUTH ONCE DAILY TO PREVENT GOUT  . aspirin EC 81 MG tablet Take 81 mg by mouth daily.  Marland Kitchen atenolol  50 MG tablet Take 1 tablet (50 mg total) by mouth daily.  . SUPER B COMPLEX  Take by mouth daily.  Marland Kitchen VITAMIN D   Take 5,000 Units by mouth every other day.   . citalopram  40 MG tablet TAKE ONE TABLET BY MOUTH ONCE DAILY FOR  MOOD  . diltiazem (CARDIZEM CD) 240 MG TAKE ONE CAPSULE BY MOUTH ONCE DAILY FOR BLOOD PRESSURE  . fish oil 1000 MG  Take 1 g by mouth daily.  . indapamide  1.25 MG tablet TAKE ONE TABLET BY MOUTH TWICE DAILY FOR BLOOD PRESSURE AND FLUID  . lisinopril  20 MG tablet TAKE ONE TABLET BY MOUTH ONCE DAILY FOR BLOOD PRESSURE  . pravastatin  40 MG tablet TAKE ONE TABLET BY MOUTH ONCE DAILY  . ranitidine  300 MG tablet TAKE ONE TABLET BY MOUTH EVERY DAY AS NEEDED FOR  ACID   INDIGESTION   Allergies  Allergen Reactions  . Buspar [Buspirone]     dysphoria  . Lipitor [Atorvastatin]     Myalgias  . Loop Diuretics     Fatigue/weakness  . Metformin And Related     Gas/bloating  . Nsaids     GI upset  . Sulfa Antibiotics Other (See Comments)    Reaction=throat itching  . Sulfonamide Derivatives    Past Medical History  Diagnosis Date  . A-fib   . Hypertension   . Hyperlipemia   . Arthritis   . Gout   . Anxiety   . Diabetes mellitus    Health Maintenance  Topic Date Due  . FOOT EXAM  02/28/1947  . OPHTHALMOLOGY EXAM  02/28/1947  . COLONOSCOPY  02/28/1987  . INFLUENZA VACCINE  02/27/2014  . URINE MICROALBUMIN  06/09/2014  . HEMOGLOBIN A1C  06/17/2014  . TETANUS/TDAP  06/01/2017  . PNEUMOCOCCAL POLYSACCHARIDE VACCINE AGE 22 AND OVER  Completed  . ZOSTAVAX  Completed   Immunization History  Administered Date(s) Administered  . Pneumococcal-Unspecified 06/07/2003  . Td 06/02/2007  . Zoster 05/30/2012   Past Surgical History  Procedure Laterality Date  . Knee arthroscopy    . Abdominal hysterectomy    . Biopsy breast      (L) BREAST IN 1993   Family History  Problem Relation  Age of Onset  . Diabetes Mother   . Hypertension Father   . CVA Father    History  Substance Use Topics  . Smoking status: Former Smoker    Quit date: 07/30/2006  . Smokeless tobacco: Not on file  . Alcohol Use: 1.0 oz/week    2 drink(s) per week    ROS Constitutional: Denies fever, chills, weight loss/gain, headaches, insomnia, fatigue, night sweats, and change in appetite. Eyes: Denies redness, blurred vision, diplopia, discharge, itchy, watery eyes.  ENT: Denies discharge, congestion, post nasal drip, epistaxis, sore throat, earache, hearing loss, dental pain, Tinnitus, Vertigo, Sinus pain, snoring.  Cardio: Denies chest pain, palpitations, irregular heartbeat, syncope, dyspnea, diaphoresis, orthopnea, PND, claudication, edema Respiratory: denies cough,  dyspnea, DOE, pleurisy, hoarseness, laryngitis, wheezing.  Gastrointestinal: Denies dysphagia, heartburn, reflux, water brash, pain, cramps, nausea, vomiting, bloating, diarrhea, constipation, hematemesis, melena, hematochezia, jaundice, hemorrhoids Genitourinary: Denies dysuria, frequency, urgency, nocturia, hesitancy, discharge, hematuria, flank pain Breast: Breast lumps, nipple discharge, bleeding.  Musculoskeletal: Denies arthralgia, myalgia, stiffness, Jt. Swelling, pain, limp, and strain/sprain. Denies falls. Skin: Denies puritis, rash, hives, warts, acne, eczema, changing in skin lesion Neuro: No weakness, tremor, incoordination, spasms, paresthesia, pain Psychiatric: Denies confusion, memory loss, sensory loss. Denies Depression. Endocrine: Denies change in weight, skin, hair change, nocturia, and paresthesia, diabetic polys, visual blurring, hyper / hypo glycemic episodes.  Heme/Lymph: No excessive bleeding, bruising, enlarged lymph nodes.  Physical Exam  BP 140/78  Pulse 64  Temp 98.1 F   Resp 18  Ht 5\' 4"    Wt 167 lb 9.6 oz   BMI 28.75   General Appearance: Well nourished and in no apparent distress. Eyes: PERRLA, EOMs, conjunctiva no swelling or erythema, normal fundi and vessels. Sinuses: No frontal/maxillary tenderness ENT/Mouth: EACs patent / TMs  nl. Nares clear without erythema, swelling, mucoid exudates. Oral hygiene is good. No erythema, swelling, or exudate. Tongue normal, non-obstructing. Tonsils not swollen or erythematous. Hearing normal.  Neck: Supple, thyroid normal. No bruits, nodes or JVD. Respiratory: Respiratory effort normal.  BS equal and clear bilateral without rales, rhonci, wheezing or stridor. Cardio: Heart sounds are normal with regular rate and rhythm and no murmurs, rubs or gallops. Peripheral pulses are normal and equal bilaterally without edema. No aortic or femoral bruits. Chest: symmetric with normal excursions and percussion. Breasts:  Symmetric, without lumps, nipple discharge, retractions, or fibrocystic changes.  Abdomen: Flat, soft, with bowl sounds. Nontender, no guarding, rebound, hernias, masses, or organomegaly.  Lymphatics: Non tender without lymphadenopathy.  Musculoskeletal: Full ROM all peripheral extremities, joint stability, 5/5 strength, and normal gait. Skin: Warm and dry without rashes, lesions, cyanosis, clubbing or  ecchymosis.  Neuro: Cranial nerves intact, reflexes equal bilaterally. Normal muscle tone, no cerebellar symptoms. Sensation of distal lower extremities intact by mono filament testing. Pysch: Awake and oriented X 3, normal affect, Insight and Judgment appropriate.   Assessment and Plan  1. Annual Screening Examination 2. Hypertension  3. Hyperlipidemia 4. T2_NIDDM, diet controlled w/CKD2 5. Vitamin D Deficiency   Continue prudent diet as discussed, weight control, BP monitoring, regular exercise, and medications. Discussed med's effects and SE's. Screening labs and tests as requested with regular follow-up as recommended.

## 2014-06-22 NOTE — Patient Instructions (Signed)
Recommend the book "The END of DIETING" by Dr Baker Janus   and the book "The END of DIABETES " by Dr Excell Seltzer  At [email or web address] - get book & Audio CD's      Being diabetic has a  300% increased risk for heart attack, stroke, cancer, and alzheimer- type vascular dementia. It is very important that you work harder with diet by avoiding all foods that are white except chicken & fish. Avoid white rice (brown & wild rice is OK), white potatoes (sweetpotatoes in moderation is OK), White bread or wheat bread or anything made out of white flour like bagels, donuts, rolls, buns, biscuits, cakes, pastries, cookies, pizza crust, and pasta (made from white flour & egg whites) - vegetarian pasta or spinach or wheat pasta is OK. Multigrain breads like Arnold's or [employer], or multigrain sandwich thins or flatbreads.  Diet, exercise and weight loss can reverse and cure diabetes in the early stages.  Diet, exercise and weight loss is very important in the control and prevention of complications of diabetes which affects every system in your body, ie. Brain - dementia/stroke, eyes - glaucoma/blindness, heart - heart attack/heart failure, kidneys - dialysis, stomach - gastric paralysis, intestines - malabsorption, nerves - severe painful neuritis, circulation - gangrene & loss of a leg(s), and finally cancer and Alzheimers.    I recommend avoid fried & greasy foods,  sweets/candy, white rice (brown or wild rice or Quinoa is OK), white potatoes (sweet potatoes are OK) - anything made from white flour - bagels, doughnuts, rolls, buns, biscuits,white and wheat breads, pizza crust and traditional pasta made of white flour & egg white(vegetarian pasta or spinach or wheat pasta is OK).  Multi-grain bread is OK - like multi-grain flat bread or sandwich thins. Avoid alcohol in excess. Exercise is also important.    Eat all the vegetables you want - avoid meat, especially red meat and dairy - especially cheese.  Cheese  is the most concentrated form of trans-fats which is the worst thing to clog up our arteries. Veggie cheese is OK which can be found in the fresh produce section at [employer] or [employer] or [employer]  Preventive Care for Adults A healthy lifestyle and preventive care can promote health and wellness. Preventive health guidelines for women include the following key practices.  A routine yearly physical is a good way to check with your health care provider about your health and preventive screening. It is a chance to share any concerns and updates on your health and to receive a thorough exam.  Visit your dentist for a routine exam and preventive care every 6 months. Brush your teeth twice a day and floss once a day. Good oral hygiene prevents tooth decay and gum disease.  The frequency of eye exams is based on your age, health, family medical history, use of contact lenses, and other factors. Follow your health care provider's recommendations for frequency of eye exams.  Eat a healthy diet. Foods like vegetables, fruits, whole grains, low-fat dairy products, and lean protein foods contain the nutrients you need without too many calories. Decrease your intake of foods high in solid fats, added sugars, and salt. Eat the right amount of calories for you.Get information about a proper diet from your health care provider, if necessary.  Regular physical exercise is one of the most important things you can do for your health. Most adults should get at least 150 minutes of moderate-intensity exercise (any activity that increases  your heart rate and causes you to sweat) each week. In addition, most adults need muscle-strengthening exercises on 2 or more days a week.  Maintain a healthy weight. The body mass index (BMI) is a screening tool to identify possible weight problems. It provides an estimate of body fat based on height and weight. Your health care provider can find your BMI and can help you  achieve or maintain a healthy weight.For adults 20 years and older:  A BMI below 18.5 is considered underweight.  A BMI of 18.5 to 24.9 is normal.  A BMI of 25 to 29.9 is considered overweight.  A BMI of 30 and above is considered obese.  Maintain normal blood lipids and cholesterol levels by exercising and minimizing your intake of saturated fat. Eat a balanced diet with plenty of fruit and vegetables. If your lipid or cholesterol levels are high, you are over 50, or you are at high risk for heart disease, you may need your cholesterol levels checked more frequently.Ongoing high lipid and cholesterol levels should be treated with medicines if diet and exercise are not working.  If you smoke, find out from your health care provider how to quit. If you do not use tobacco, do not start.  Lung cancer screening is recommended for adults aged 55-80 years who are at high risk for developing lung cancer because of a history of smoking. A yearly low-dose CT scan of the lungs is recommended for people who have at least a 30-pack-year history of smoking and are a current smoker or have quit within the past 15 years. A pack year of smoking is smoking an average of 1 pack of cigarettes a day for 1 year (for example: 1 pack a day for 30 years or 2 packs a day for 15 years). Yearly screening should continue until the smoker has stopped smoking for at least 15 years. Yearly screening should be stopped for people who develop a health problem that would prevent them from having lung cancer treatment.  Avoid use of street drugs. Do not share needles with anyone. Ask for help if you need support or instructions about stopping the use of drugs.  High blood pressure causes heart disease and increases the risk of stroke.  Ongoing high blood pressure should be treated with medicines if weight loss and exercise do not work.  If you are 55-79 years old, ask your health care provider if you should take aspirin to  prevent strokes.  Diabetes screening involves taking a blood sample to check your fasting blood sugar level. This should be done once every 3 years, after age 45, if you are within normal weight and without risk factors for diabetes. Testing should be considered at a younger age or be carried out more frequently if you are overweight and have at least 1 risk factor for diabetes.  Breast cancer screening is essential preventive care for women. You should practice "breast self-awareness." This means understanding the normal appearance and feel of your breasts and may include breast self-examination. Any changes detected, no matter how small, should be reported to a health care provider. Women in their 20s and 30s should have a clinical breast exam (CBE) by a health care provider as part of a regular health exam every 1 to 3 years. After age 40, women should have a CBE every year. Starting at age 40, women should consider having a mammogram (breast X-ray test) every year. Women who have a family history of breast cancer should   talk to their health care provider about genetic screening. Women at a high risk of breast cancer should talk to their health care providers about having an MRI and a mammogram every year.  Breast cancer gene (BRCA)-related cancer risk assessment is recommended for women who have family members with BRCA-related cancers. BRCA-related cancers include breast, ovarian, tubal, and peritoneal cancers. Having family members with these cancers may be associated with an increased risk for harmful changes (mutations) in the breast cancer genes BRCA1 and BRCA2. Results of the assessment will determine the need for genetic counseling and BRCA1 and BRCA2 testing.  Routine pelvic exams to screen for cancer are no longer recommended for nonpregnant women who are considered low risk for cancer of the pelvic organs (ovaries, uterus, and vagina) and who do not have symptoms. Ask your health care provider  if a screening pelvic exam is right for you.  If you have had past treatment for cervical cancer or a condition that could lead to cancer, you need Pap tests and screening for cancer for at least 20 years after your treatment. If Pap tests have been discontinued, your risk factors (such as having a new sexual partner) need to be reassessed to determine if screening should be resumed. Some women have medical problems that increase the chance of getting cervical cancer. In these cases, your health care provider may recommend more frequent screening and Pap tests.    Colorectal cancer can be detected and often prevented. Most routine colorectal cancer screening begins at the age of 90 years and continues through age 8 years. However, your health care provider may recommend screening at an earlier age if you have risk factors for colon cancer. On a yearly basis, your health care provider may provide home test kits to check for hidden blood in the stool. Use of a small camera at the end of a tube, to directly examine the colon (sigmoidoscopy or colonoscopy), can detect the earliest forms of colorectal cancer. Talk to your health care provider about this at age 86, when routine screening begins. Direct exam of the colon should be repeated every 5-10 years through age 32 years, unless early forms of pre-cancerous polyps or small growths are found.  Osteoporosis is a disease in which the bones lose minerals and strength with aging. This can result in serious bone fractures or breaks. The risk of osteoporosis can be identified using a bone density scan. Women ages 75 years and over and women at risk for fractures or osteoporosis should discuss screening with their health care providers. Ask your health care provider whether you should take a calcium supplement or vitamin D to reduce the rate of osteoporosis.  Menopause can be associated with physical symptoms and risks. Hormone replacement therapy is available to  decrease symptoms and risks. You should talk to your health care provider about whether hormone replacement therapy is right for you.  Use sunscreen. Apply sunscreen liberally and repeatedly throughout the day. You should seek shade when your shadow is shorter than you. Protect yourself by wearing long sleeves, pants, a wide-brimmed hat, and sunglasses year round, whenever you are outdoors.  Once a month, do a whole body skin exam, using a mirror to look at the skin on your back. Tell your health care provider of new moles, moles that have irregular borders, moles that are larger than a pencil eraser, or moles that have changed in shape or color.  Stay current with required vaccines (immunizations).  Influenza vaccine. All adults  should be immunized every year.  Tetanus, diphtheria, and acellular pertussis (Td, Tdap) vaccine. Pregnant women should receive 1 dose of Tdap vaccine during each pregnancy. The dose should be obtained regardless of the length of time since the last dose. Immunization is preferred during the 27th-36th week of gestation. An adult who has not previously received Tdap or who does not know her vaccine status should receive 1 dose of Tdap. This initial dose should be followed by tetanus and diphtheria toxoids (Td) booster doses every 10 years. Adults with an unknown or incomplete history of completing a 3-dose immunization series with Td-containing vaccines should begin or complete a primary immunization series including a Tdap dose. Adults should receive a Td booster every 10 years.    Zoster vaccine. One dose is recommended for adults aged 44 years or older unless certain conditions are present.    Pneumococcal 13-valent conjugate (PCV13) vaccine. When indicated, a person who is uncertain of her immunization history and has no record of immunization should receive the PCV13 vaccine. An adult aged 74 years or older who has certain medical conditions and has not been previously  immunized should receive 1 dose of PCV13 vaccine. This PCV13 should be followed with a dose of pneumococcal polysaccharide (PPSV23) vaccine. The PPSV23 vaccine dose should be obtained at least 8 weeks after the dose of PCV13 vaccine. An adult aged 66 years or older who has certain medical conditions and previously received 1 or more doses of PPSV23 vaccine should receive 1 dose of PCV13. The PCV13 vaccine dose should be obtained 1 or more years after the last PPSV23 vaccine dose.    Pneumococcal polysaccharide (PPSV23) vaccine. When PCV13 is also indicated, PCV13 should be obtained first. All adults aged 89 years and older should be immunized. An adult younger than age 80 years who has certain medical conditions should be immunized. Any person who resides in a nursing home or long-term care facility should be immunized. An adult smoker should be immunized. People with an immunocompromised condition and certain other conditions should receive both PCV13 and PPSV23 vaccines. People with human immunodeficiency virus (HIV) infection should be immunized as soon as possible after diagnosis. Immunization during chemotherapy or radiation therapy should be avoided. Routine use of PPSV23 vaccine is not recommended for American Indians, [place] Natives, or people younger than 65 years unless there are medical conditions that require PPSV23 vaccine. When indicated, people who have unknown immunization and have no record of immunization should receive PPSV23 vaccine. One-time revaccination 5 years after the first dose of PPSV23 is recommended for people aged 19-64 years who have chronic kidney failure, nephrotic syndrome, asplenia, or immunocompromised conditions. People who received 1-2 doses of PPSV23 before age 34 years should receive another dose of PPSV23 vaccine at age 31 years or later if at least 5 years have passed since the previous dose. Doses of PPSV23 are not needed for people immunized with PPSV23 at or after  age 29 years.   Preventive Services / Frequency  Ages 55 years and over  Blood pressure check.  Lipid and cholesterol check.  Lung cancer screening. / Every year if you are aged 29-80 years and have a 30-pack-year history of smoking and currently smoke or have quit within the past 15 years. Yearly screening is stopped once you have quit smoking for at least 15 years or develop a health problem that would prevent you from having lung cancer treatment.  Clinical breast exam.** / Every year after age 40 years.  BRCA-related cancer risk assessment.** / For women who have family members with a BRCA-related cancer (breast, ovarian, tubal, or peritoneal cancers).  Mammogram.** / Every year beginning at age 40 years and continuing for as long as you are in good health. Consult with your health care provider.  Pap test.** / Every 3 years starting at age 30 years through age 65 or 70 years with 3 consecutive normal Pap tests. Testing can be stopped between 65 and 70 years with 3 consecutive normal Pap tests and no abnormal Pap or HPV tests in the past 10 years.  Fecal occult blood test (FOBT) of stool. / Every year beginning at age 50 years and continuing until age 75 years. You may not need to do this test if you get a colonoscopy every 10 years.  Flexible sigmoidoscopy or colonoscopy.** / Every 5 years for a flexible sigmoidoscopy or every 10 years for a colonoscopy beginning at age 50 years and continuing until age 75 years.  Hepatitis C blood test.** / For all people born from 1945 through 1965 and any individual with known risks for hepatitis C.  Osteoporosis screening.** / A one-time screening for women ages 65 years and over and women at risk for fractures or osteoporosis.  Skin self-exam. / Monthly.  Influenza vaccine. / Every year.  Tetanus, diphtheria, and acellular pertussis (Tdap/Td) vaccine.** / 1 dose of Td every 10 years.  Zoster vaccine.** / 1 dose for adults aged 60 years  or older.  Pneumococcal 13-valent conjugate (PCV13) vaccine.** / Consult your health care provider.  Pneumococcal polysaccharide (PPSV23) vaccine.** / 1 dose for all adults aged 65 years and older. Screening for abdominal aortic aneurysm (AAA)  by ultrasound is recommended for people who have history of high blood pressure or who are current or former smokers. 

## 2014-06-23 LAB — URINALYSIS, MICROSCOPIC ONLY
Casts: NONE SEEN
Crystals: NONE SEEN
Squamous Epithelial / LPF: NONE SEEN

## 2014-06-23 LAB — VITAMIN D 25 HYDROXY (VIT D DEFICIENCY, FRACTURES): Vit D, 25-Hydroxy: 40 ng/mL (ref 30–100)

## 2014-06-23 LAB — HEMOGLOBIN A1C
Hgb A1c MFr Bld: 6.6 % — ABNORMAL HIGH (ref <5.7)
Mean Plasma Glucose: 143 mg/dL — ABNORMAL HIGH (ref <117)

## 2014-06-23 LAB — MICROALBUMIN / CREATININE URINE RATIO
CREATININE, URINE: 43.9 mg/dL
MICROALB/CREAT RATIO: 4.6 mg/g (ref 0.0–30.0)
Microalb, Ur: 0.2 mg/dL (ref <2.0)

## 2014-06-23 LAB — INSULIN, FASTING: INSULIN FASTING, SERUM: 11.1 u[IU]/mL (ref 2.0–19.6)

## 2014-07-26 ENCOUNTER — Other Ambulatory Visit (INDEPENDENT_AMBULATORY_CARE_PROVIDER_SITE_OTHER): Payer: Medicare Other

## 2014-07-26 DIAGNOSIS — Z1212 Encounter for screening for malignant neoplasm of rectum: Secondary | ICD-10-CM

## 2014-07-26 LAB — POC HEMOCCULT BLD/STL (HOME/3-CARD/SCREEN)
Card #3 Fecal Occult Blood, POC: NEGATIVE
FECAL OCCULT BLD: NEGATIVE
Fecal Occult Blood, POC: NEGATIVE

## 2014-08-05 ENCOUNTER — Other Ambulatory Visit: Payer: Self-pay | Admitting: Internal Medicine

## 2014-08-16 ENCOUNTER — Other Ambulatory Visit: Payer: Self-pay | Admitting: Internal Medicine

## 2014-08-27 ENCOUNTER — Other Ambulatory Visit: Payer: Self-pay | Admitting: Internal Medicine

## 2014-08-27 DIAGNOSIS — E1165 Type 2 diabetes mellitus with hyperglycemia: Secondary | ICD-10-CM | POA: Diagnosis not present

## 2014-08-30 DIAGNOSIS — E119 Type 2 diabetes mellitus without complications: Secondary | ICD-10-CM | POA: Diagnosis not present

## 2014-08-30 DIAGNOSIS — H11159 Pinguecula, unspecified eye: Secondary | ICD-10-CM | POA: Diagnosis not present

## 2014-08-30 DIAGNOSIS — H25099 Other age-related incipient cataract, unspecified eye: Secondary | ICD-10-CM | POA: Diagnosis not present

## 2014-09-24 ENCOUNTER — Encounter: Payer: Self-pay | Admitting: Physician Assistant

## 2014-09-24 ENCOUNTER — Ambulatory Visit (INDEPENDENT_AMBULATORY_CARE_PROVIDER_SITE_OTHER): Payer: Medicare Other | Admitting: Physician Assistant

## 2014-09-24 VITALS — BP 122/72 | HR 60 | Temp 98.1°F | Resp 16 | Ht 65.0 in | Wt 166.0 lb

## 2014-09-24 DIAGNOSIS — E785 Hyperlipidemia, unspecified: Secondary | ICD-10-CM | POA: Diagnosis not present

## 2014-09-24 DIAGNOSIS — I10 Essential (primary) hypertension: Secondary | ICD-10-CM

## 2014-09-24 DIAGNOSIS — E559 Vitamin D deficiency, unspecified: Secondary | ICD-10-CM

## 2014-09-24 DIAGNOSIS — E1122 Type 2 diabetes mellitus with diabetic chronic kidney disease: Secondary | ICD-10-CM

## 2014-09-24 DIAGNOSIS — Z79899 Other long term (current) drug therapy: Secondary | ICD-10-CM | POA: Diagnosis not present

## 2014-09-24 LAB — CBC WITH DIFFERENTIAL/PLATELET
Basophils Absolute: 0.1 10*3/uL (ref 0.0–0.1)
Basophils Relative: 1 % (ref 0–1)
EOS ABS: 0.1 10*3/uL (ref 0.0–0.7)
EOS PCT: 2 % (ref 0–5)
HEMATOCRIT: 42.9 % (ref 36.0–46.0)
HEMOGLOBIN: 14.5 g/dL (ref 12.0–15.0)
LYMPHS ABS: 2.7 10*3/uL (ref 0.7–4.0)
LYMPHS PCT: 42 % (ref 12–46)
MCH: 33 pg (ref 26.0–34.0)
MCHC: 33.8 g/dL (ref 30.0–36.0)
MCV: 97.5 fL (ref 78.0–100.0)
MONOS PCT: 7 % (ref 3–12)
MPV: 10.3 fL (ref 8.6–12.4)
Monocytes Absolute: 0.5 10*3/uL (ref 0.1–1.0)
Neutro Abs: 3.1 10*3/uL (ref 1.7–7.7)
Neutrophils Relative %: 48 % (ref 43–77)
Platelets: 188 10*3/uL (ref 150–400)
RBC: 4.4 MIL/uL (ref 3.87–5.11)
RDW: 13.9 % (ref 11.5–15.5)
WBC: 6.5 10*3/uL (ref 4.0–10.5)

## 2014-09-24 LAB — BASIC METABOLIC PANEL WITH GFR
BUN: 22 mg/dL (ref 6–23)
CO2: 29 meq/L (ref 19–32)
Calcium: 9.1 mg/dL (ref 8.4–10.5)
Chloride: 103 mEq/L (ref 96–112)
Creat: 0.84 mg/dL (ref 0.50–1.10)
GFR, EST AFRICAN AMERICAN: 78 mL/min
GFR, Est Non African American: 67 mL/min
GLUCOSE: 97 mg/dL (ref 70–99)
Potassium: 3.7 mEq/L (ref 3.5–5.3)
SODIUM: 141 meq/L (ref 135–145)

## 2014-09-24 LAB — LIPID PANEL
CHOLESTEROL: 144 mg/dL (ref 0–200)
HDL: 52 mg/dL (ref >=46)
LDL Cholesterol: 68 mg/dL (ref 0–99)
TRIGLYCERIDES: 121 mg/dL (ref <150)
Total CHOL/HDL Ratio: 2.8 Ratio
VLDL: 24 mg/dL (ref 0–40)

## 2014-09-24 LAB — HEPATIC FUNCTION PANEL
ALBUMIN: 4.2 g/dL (ref 3.5–5.2)
ALK PHOS: 42 U/L (ref 39–117)
ALT: 17 U/L (ref 0–35)
AST: 18 U/L (ref 0–37)
BILIRUBIN INDIRECT: 0.5 mg/dL (ref 0.2–1.2)
Bilirubin, Direct: 0.1 mg/dL (ref 0.0–0.3)
TOTAL PROTEIN: 6.6 g/dL (ref 6.0–8.3)
Total Bilirubin: 0.6 mg/dL (ref 0.2–1.2)

## 2014-09-24 LAB — TSH: TSH: 0.966 u[IU]/mL (ref 0.350–4.500)

## 2014-09-24 LAB — MAGNESIUM: Magnesium: 1.8 mg/dL (ref 1.5–2.5)

## 2014-09-24 NOTE — Progress Notes (Signed)
Assessment and Plan:  Hypertension: Continue medication, monitor blood pressure at home. Continue DASH diet.  Reminder to go to the ER if any CP, SOB, nausea, dizziness, severe HA, changes vision/speech, left arm numbness and tingling and jaw pain. Cholesterol: Continue diet and exercise. Check cholesterol.  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 Check A1C Vitamin D Def- check level and continue medications.   Continue diet and meds as discussed. Further disposition pending results of labs. Discussed med's effects and SE's.    HPI 80 y.o. female  presents for 3 month follow up with hypertension, hyperlipidemia, diabetes and vitamin D.  Her blood pressure has been controlled at home, today their BP is BP: 122/72 mmHg.  She does workout. She denies chest pain, shortness of breath, dizziness.  She is on cholesterol medication and denies myalgias. Her cholesterol is not at goal. The cholesterol was:  06/22/2014: Cholesterol, Total 172; HDL Cholesterol by NMR 54; LDL (calc) 96; Triglycerides 111  She has been working on diet and exercise for diabetes with diabetic chronic kidney disease and with other circulatory complications, she is on bASA, she is on ACE/ARB, and denies  paresthesia of the feet, polydipsia, polyuria and visual disturbances. Last A1C was checked at Dr. Chalmers Cater and was under 6.0.   Patient is on Vitamin D supplement. 06/22/2014: Vit D, 25-Hydroxy 40 She is on celexa for anxiety without depression.  She a history of pAfib without CAD/valvular disease for 20 years, she is on Cardizem which controls rate and rhythm, she is on a bASA but declines ASA due bleed/fall risk . CHADSVASC2 Score of 5, understands risk but does not want anticoagulation, very rare pAfib Patient is on allopurinol for gout and does not report a recent flare.  Lab Results  Component Value Date   LABURIC 4.3 06/22/2014     Current  Medications:  Current Outpatient Prescriptions on File Prior to Visit  Medication Sig Dispense Refill  . allopurinol (ZYLOPRIM) 300 MG tablet TAKE ONE TABLET BY MOUTH ONCE DAILY TO  PREVENT  GOUT 90 tablet 0  . aspirin EC 81 MG tablet Take 81 mg by mouth daily.    Marland Kitchen atenolol (TENORMIN) 50 MG tablet Take 1 tablet (50 mg total) by mouth daily. 90 tablet 3  . B Complex-C (SUPER B COMPLEX PO) Take by mouth daily.    Marland Kitchen CARTIA XT 240 MG 24 hr capsule TAKE ONE CAPSULE BY MOUTH ONCE DAILY FOR BLOOD PRESSURE 90 capsule 0  . cholecalciferol (VITAMIN D) 1000 UNITS tablet Take 5,000 Units by mouth every other day.     . citalopram (CELEXA) 40 MG tablet TAKE ONE TABLET BY MOUTH ONCE DAILY FOR  MOOD 90 tablet 2  . fish oil-omega-3 fatty acids 1000 MG capsule Take 1 g by mouth daily.    . indapamide (LOZOL) 1.25 MG tablet TAKE ONE TABLET BY MOUTH TWICE DAILY FOR BLOOD PRESSURE AND FLUID 180 tablet 0  . lisinopril (PRINIVIL,ZESTRIL) 20 MG tablet TAKE ONE TABLET BY MOUTH ONCE DAILY FOR BLOOD PRESSURE 90 tablet 99  . pravastatin (PRAVACHOL) 40 MG tablet TAKE ONE TABLET BY MOUTH ONCE DAILY 90 tablet 99  . ranitidine (ZANTAC) 300 MG tablet TAKE ONE TABLET BY MOUTH EVERY DAY AS NEEDED FOR  ACID  INDIGESTION 90 tablet 0   No current facility-administered medications on file prior to visit.   Medical History:  Past Medical History  Diagnosis Date  . A-fib   . Hypertension   .  Hyperlipemia   . Arthritis   . Gout   . Anxiety   . Diabetes mellitus    Allergies:  Allergies  Allergen Reactions  . Buspar [Buspirone]     dysphoria  . Lipitor [Atorvastatin]     Myalgias  . Loop Diuretics     Fatigue/weakness  . Metformin And Related     Gas/bloating  . Nsaids     GI upset  . Sulfa Antibiotics Other (See Comments)    Reaction=throat itching  . Sulfonamide Derivatives      Review of Systems:  Review of Systems  Constitutional: Negative.   HENT: Negative.   Eyes: Negative.   Respiratory: Negative.    Cardiovascular: Negative.   Gastrointestinal: Negative.   Genitourinary: Negative.   Musculoskeletal: Negative.   Skin: Negative.   Neurological: Negative.   Endo/Heme/Allergies: Negative.   Psychiatric/Behavioral: Negative.     Family history- Review and unchanged Social history- Review and unchanged Physical Exam: BP 122/72 mmHg  Pulse 60  Temp(Src) 98.1 F (36.7 C)  Resp 16  Ht 5\' 5"  (1.651 m)  Wt 166 lb (75.297 kg)  BMI 27.62 kg/m2 Wt Readings from Last 3 Encounters:  09/24/14 166 lb (75.297 kg)  06/22/14 167 lb 9.6 oz (76.023 kg)  05/27/14 167 lb (75.751 kg)   General Appearance: Well nourished, in no apparent distress. Eyes: PERRLA, EOMs, conjunctiva no swelling or erythema Sinuses: No Frontal/maxillary tenderness ENT/Mouth: Ext aud canals clear, TMs without erythema, bulging. No erythema, swelling, or exudate on post pharynx.  Tonsils not swollen or erythematous. Hearing normal.  Neck: Supple, thyroid normal.  Respiratory: Respiratory effort normal, BS equal bilaterally without rales, rhonchi, wheezing or stridor.  Cardio: RRR with no MRGs. Brisk peripheral pulses without edema.  Abdomen: Soft, + BS.  Non tender, no guarding, rebound, hernias, masses. Lymphatics: Non tender without lymphadenopathy.  Musculoskeletal: Full ROM, 5/5 strength, Normal gait Skin: Warm, dry without rashes, lesions, ecchymosis.  Neuro: Cranial nerves intact. No cerebellar symptoms.  Psych: Awake and oriented X 3, normal affect, Insight and Judgment appropriate.    Vicie Mutters, PA-C 11:07 AM [facility]

## 2014-09-24 NOTE — Patient Instructions (Signed)
Diabetes is a very complicated disease...lets simplify it.  An easy way to look at it to understand the complications is if you think of the extra sugar floating in your blood stream as glass shards floating through your blood stream.    Diabetes affects your small vessels first: 1) The glass shards (sugar) scraps down the tiny blood vessels in your eyes and lead to diabetic retinopathy, the leading cause of blindness in the [place]. Diabetes is the leading cause of newly diagnosed adult (20 to 78 years of age) blindness in the [place].  2) The glass shards scratches down the tiny vessels of your legs leading to nerve damage called neuropathy and can lead to amputations of your feet. More than 60% of all non-traumatic amputations of lower limbs occur in people with diabetes.  3) Over time the small vessels in your brain are shredded and closed off, individually this does not cause any problems but over a long period of time many of the small vessels being blocked can lead to Vascular Dementia.   4) Your kidney's are a filter system and have a "net" that keeps certain things in the body and lets bad things out. Sugar shreds this net and leads to kidney damage and eventually failure. Decreasing the sugar that is destroying the net and certain blood pressure medications can help stop or decrease progression of kidney disease. Diabetes was the primary cause of kidney failure in 44 percent of all new cases in 2011.  5) Diabetes also destroys the small vessels in your penis that lead to erectile dysfunction. Eventually the vessels are so damaged that you may not be responsive to cialis or viagra.   Diabetes and your large vessels: Your larger vessels consist of your coronary arteries in your heart and the carotid vessels to your brain. Diabetes or even increased sugars put you at 300% increased risk of heart attack and stroke and this is why.. The sugar scrapes down your large blood vessels and your body  sees this as an internal injury and tries to repair itself. Just like you get a scab on your skin, your platelets will stick to the blood vessel wall trying to heal it. This is why we have diabetics on low dose aspirin daily, this prevents the platelets from sticking and can prevent plaque formation. In addition, your body takes cholesterol and tries to shove it into the open wound. This is why we want your LDL, or bad cholesterol, below 70.   The combination of platelets and cholesterol over 5-10 years forms plaque that can break off and cause a heart attack or stroke.   PLEASE REMEMBER:  Diabetes is preventable! Up to 85 percent of complications and morbidities among individuals with type 2 diabetes can be prevented, delayed, or effectively treated and minimized with regular visits to a health professional, appropriate monitoring and medication, and a healthy diet and lifestyle.  Before you even begin to attack a weight-loss plan, it pays to remember this: You are not fat. You have fat. Losing weight isn't about blame or shame; it's simply another achievement to accomplish. Dieting is like any other skill-you have to buckle down and work at it. As long as you act in a smart, reasonable way, you'll ultimately get where you want to be. Here are some weight loss pearls for you.  1. It's Not a Diet. It's a Lifestyle Thinking of a diet as something you're on and suffering through only for the short term doesn't   work. To shed weight and keep it off, you need to make permanent changes to the way you eat. It's OK to indulge occasionally, of course, but if you cut calories temporarily and then revert to your old way of eating, you'll gain back the weight quicker than you can say yo-yo. Use it to lose it. Research shows that one of the best predictors of long-term weight loss is how many pounds you drop in the first month. For that reason, nutritionists often suggest being stricter for the first two weeks of your  new eating strategy to build momentum. Cut out added sugar and alcohol and avoid unrefined carbs. After that, figure out how you can reincorporate them in a way that's healthy and maintainable.  2. There's a Right Way to Exercise Working out burns calories and fat and boosts your metabolism by building muscle. But those trying to lose weight are notorious for overestimating the number of calories they burn and underestimating the amount they take in. Unfortunately, your system is biologically programmed to hold on to extra pounds and that means when you start exercising, your body senses the deficit and ramps up its hunger signals. If you're not diligent, you'll eat everything you burn and then some. Use it to lose it. Cardio gets all the exercise glory, but strength and interval training are the real heroes. They help you build lean muscle, which in turn increases your metabolism and calorie-burning ability 3. Don't Overreact to Mild Hunger Some people have a hard time losing weight because of hunger anxiety. To them, being hungry is bad-something to be avoided at all costs-so they carry snacks with them and eat when they don't need to. Others eat because they're stressed out or bored. While you never want to get to the point of being ravenous (that's when bingeing is likely to happen), a hunger pang, a craving, or the fact that it's 3:00 p.m. should not send you racing for the vending machine or obsessing about the energy bar in your purse. Ideally, you should put off eating until your stomach is growling and it's difficult to concentrate.  Use it to lose it. When you feel the urge to eat, use the HALT method. Ask yourself, Am I really hungry? Or am I angry or anxious, lonely or bored, or tired? If you're still not certain, try the apple test. If you're truly hungry, an apple should seem delicious; if it doesn't, something else is going on. Or you can try drinking water and making yourself busy, if you are  still hungry try a healthy snack.  4. Not All Calories Are Created Equal The mechanics of weight loss are pretty simple: Take in fewer calories than you use for energy. But the kind of food you eat makes all the difference. Processed food that's high in saturated fat and refined starch or sugar can cause inflammation that disrupts the hormone signals that tell your brain you're full. The result: You eat a lot more.  Use it to lose it. Clean up your diet. Swap in whole, unprocessed foods, including vegetables, lean protein, and healthy fats that will fill you up and give you the biggest nutritional bang for your calorie buck. In a few weeks, as your brain starts receiving regular hunger and fullness signals once again, you'll notice that you feel less hungry overall and naturally start cutting back on the amount you eat.  5. Protein, Produce, and Plant-Based Fats Are Your Weight-Loss Trinity Here's why eating the three   Ps regularly will help you drop pounds. Protein fills you up. You need it to build lean muscle, which keeps your metabolism humming so that you can torch more fat. People in a weight-loss program who ate double the recommended daily allowance for protein (about 110 grams for a 150-pound woman) lost 70 percent of their weight from fat, while people who ate the RDA lost only about 40 percent, one study found. Produce is packed with filling fiber. "It's very difficult to consume too many calories if you're eating a lot of vegetables. Example: Three cups of broccoli is a lot of food, yet only 93 calories. (Fruit is another story. It can be easy to overeat and can contain a lot of calories from sugar, so be sure to monitor your intake.) Plant-based fats like olive oil and those in avocados and nuts are healthy and extra satiating.  Use it to lose it. Aim to incorporate each of the three Ps into every meal and snack. People who eat protein throughout the day are able to keep weight off, according  to a study in the American Journal of Clinical Nutrition. In addition to meat, poultry and seafood, good sources are beans, lentils, eggs, tofu, and yogurt. As for fat, keep portion sizes in check by measuring out salad dressing, oil, and nut butters (shoot for one to two tablespoons). Finally, eat veggies or a little fruit at every meal. People who did that consumed 308 fewer calories but didn't feel any hungrier than when they didn't eat more produce.  7. How You Eat Is As Important As What You Eat In order for your brain to register that you're full, you need to focus on what you're eating. Sit down whenever you eat, preferably at a table. Turn off the TV or computer, put down your phone, and look at your food. Smell it. Chew slowly, and don't put another bite on your fork until you swallow. When women ate lunch this attentively, they consumed 30 percent less when snacking later than those who listened to an audiobook at lunchtime, according to a study in the [employer] Journal of Nutrition. 8. Weighing Yourself Really Works The scale provides the best evidence about whether your efforts are paying off. Seeing the numbers tick up or down or stagnate is motivation to keep going-or to rethink your approach. A 2015 study at [employer] found that daily weigh-ins helped people lose more weight, keep it off, and maintain that loss, even after two years. Use it to lose it. Step on the scale at the same time every day for the best results. If your weight shoots up several pounds from one weigh-in to the next, don't freak out. Eating a lot of salt the night before or having your period is the likely culprit. The number should return to normal in a day or two. It's a steady climb that you need to do something about. 9. Too Much Stress and Too Little Sleep Are Your Enemies When you're tired and frazzled, your body cranks up the production of cortisol, the stress hormone that can cause carb cravings. Not getting  enough sleep also boosts your levels of ghrelin, a hormone associated with hunger, while suppressing leptin, a hormone that signals fullness and satiety. People on a diet who slept only five and a half hours a night for two weeks lost 55 percent less fat and were hungrier than those who slept eight and a half hours, according to a study in the [employer]   [employer] Journal. Use it to lose it. Prioritize sleep, aiming for seven hours or more a night, which research shows helps lower stress. And make sure you're getting quality zzz's. If a snoring spouse or a fidgety cat wakes you up frequently throughout the night, you may end up getting the equivalent of just four hours of sleep, according to a study from [facility]. Keep pets out of the bedroom, and use a white-noise app to drown out snoring. 10. You Will Hit a plateau-And You Can Bust Through It As you slim down, your body releases much less leptin, the fullness hormone.  If you're not strength training, start right now. Building muscle can raise your metabolism to help you overcome a plateau. To keep your body challenged and burning calories, incorporate new moves and more intense intervals into your workouts or add another sweat session to your weekly routine. Alternatively, cut an extra 100 calories or so a day from your diet. Now that you've lost weight, your body simply doesn't need as much fuel.

## 2014-11-02 ENCOUNTER — Other Ambulatory Visit: Payer: Self-pay | Admitting: Physician Assistant

## 2014-11-16 ENCOUNTER — Other Ambulatory Visit: Payer: Self-pay | Admitting: *Deleted

## 2014-11-16 ENCOUNTER — Other Ambulatory Visit: Payer: Self-pay | Admitting: Internal Medicine

## 2014-11-16 MED ORDER — DILTIAZEM HCL ER COATED BEADS 240 MG PO CP24: ORAL_CAPSULE | ORAL | Status: DC

## 2015-01-07 ENCOUNTER — Ambulatory Visit (INDEPENDENT_AMBULATORY_CARE_PROVIDER_SITE_OTHER): Payer: Medicare Other | Admitting: Internal Medicine

## 2015-01-07 ENCOUNTER — Encounter: Payer: Self-pay | Admitting: Internal Medicine

## 2015-01-07 VITALS — BP 140/80 | HR 56 | Temp 97.7°F | Resp 16 | Ht 65.0 in | Wt 168.0 lb

## 2015-01-07 DIAGNOSIS — E559 Vitamin D deficiency, unspecified: Secondary | ICD-10-CM | POA: Diagnosis not present

## 2015-01-07 DIAGNOSIS — I10 Essential (primary) hypertension: Secondary | ICD-10-CM

## 2015-01-07 DIAGNOSIS — E1129 Type 2 diabetes mellitus with other diabetic kidney complication: Secondary | ICD-10-CM | POA: Diagnosis not present

## 2015-01-07 DIAGNOSIS — R6889 Other general symptoms and signs: Secondary | ICD-10-CM

## 2015-01-07 DIAGNOSIS — E785 Hyperlipidemia, unspecified: Secondary | ICD-10-CM | POA: Diagnosis not present

## 2015-01-07 DIAGNOSIS — I480 Paroxysmal atrial fibrillation: Secondary | ICD-10-CM

## 2015-01-07 DIAGNOSIS — E1121 Type 2 diabetes mellitus with diabetic nephropathy: Secondary | ICD-10-CM | POA: Diagnosis not present

## 2015-01-07 DIAGNOSIS — M1000 Idiopathic gout, unspecified site: Secondary | ICD-10-CM

## 2015-01-07 DIAGNOSIS — Z1331 Encounter for screening for depression: Secondary | ICD-10-CM

## 2015-01-07 DIAGNOSIS — Z0001 Encounter for general adult medical examination with abnormal findings: Secondary | ICD-10-CM

## 2015-01-07 DIAGNOSIS — Z9181 History of falling: Secondary | ICD-10-CM

## 2015-01-07 DIAGNOSIS — Z0000 Encounter for general adult medical examination without abnormal findings: Secondary | ICD-10-CM

## 2015-01-07 DIAGNOSIS — Z79899 Other long term (current) drug therapy: Secondary | ICD-10-CM | POA: Diagnosis not present

## 2015-01-07 NOTE — Patient Instructions (Signed)

## 2015-01-07 NOTE — Progress Notes (Signed)
Patient ID: Ruth Delgado, female   DOB: 1935/03/28, 80 y.o.   MRN: [id number]  MEDICARE ANNUAL WELLNESS VISIT AND OV  Assessment:   1. Essential hypertension  - TSH  2. Hyperlipemia  - Lipid panel  3. Type 2 diabetes mellitus with diabetic nephropathy  - Hemoglobin A1c - Insulin, random  4. Vitamin D deficiency  - Vit D  25 hydroxy   5. Medication management  - CBC with Differential/Platelet - BASIC METABOLIC PANEL WITH GFR - Hepatic function panel - Magnesium  6. Idiopathic gout   7. Paroxysmal atrial fibrillation, hx   8. Routine general medical examination at a health care facility   9. At low risk for fall   10. Depression screen  - Screen Negative  11. Type 2 diabetes mellitus with other diabetic kidney complication   Plan:   During the course of the visit the patient was educated and counseled about appropriate screening and preventive services including:    Pneumococcal vaccine   Influenza vaccine  Td vaccine  Screening electrocardiogram  Bone densitometry screening  Colorectal cancer screening  Diabetes screening  Glaucoma screening  Nutrition counseling   Advanced directives: requested  Screening recommendations, referrals: Vaccinations:  Immunization History  Administered Date(s) Administered  . Influenza Split 07/13/2014  . Pneumococcal-Unspecified 06/07/2003  . Td 06/02/2007  . Zoster 05/30/2012  Prevnar vaccine out of stock Hep B vaccine not indicated  Nutrition assessed and recommended  Colonoscopy refuses Recommended yearly ophthalmology/optometry visit for glaucoma screening and checkup Recommended yearly dental visit for hygiene and checkup Advanced directives - No-  given forms  Conditions/risks identified: BMI: Discussed weight loss, diet, and increase physical activity.  Increase physical activity: AHA recommends 150 minutes of physical activity a week.  Medications reviewed Diabetes is at goal,  ACE/ARB therapy: Yes. Urinary Incontinence is not an issue: discussed non pharmacology and pharmacology options.  Fall risk: low- discussed PT, home fall assessment, medications.   Subjective:     Ruth Delgado presents for [employer] Visit and OV.  Date of last medicare wellness visit 09/08/2013.  This very nice 80 y.o. also presents for  month follow up with Hypertension, Hyperlipidemia, Pre-Diabetes and Vitamin D Deficiency.     Patient is treated for HTN  since 1991& BP has been controlled at home. Today's BP: 140/80 mmHg. Patient has had no complaints of any cardiac type chest pain, palpitations, dyspnea/orthopnea/PND, dizziness, claudication, or dependent edema.    Hyperlipidemia is controlled with diet & meds. Patient denies myalgias or other med SE's. Last Lipids were at goal -  Chol 144; HDL 52; LDL  68; Trig 121 on 09/24/2014.    Also, the patient has history of T2_NIDDM since 2007 and has been controlled with diet - and has had no symptoms of reactive hypoglycemia, diabetic polys, paresthesias or visual blurring.  Last A1c was  6.6% on 06/22/2014.   Further, the patient also has history of Vitamin D Deficiency and supplements vitamin D without any suspected side-effects. Last vitamin D was 40 on 06/22/2014. Names of Other Physician/Practitioners you currently use: 1. [facility] here for primary care 2. Dr Einar Gip, , eye doctor, last visit Jan 2016  3. Dr Jonna Coup, DDS, dentist, last visit Mar 2011  Patient Care Team: Unk Pinto, MD as PCP - General (Internal Medicine) Jacelyn Pi, MD as Consulting Physician (Endocrinology)  Medication Review: Medication Sig  . allopurinol (ZYLOPRIM) 300 MG tablet TAKE ONE TABLET BY MOUTH ONCE DAILY  TO  PREVENT  GOUT  . aspirin EC 81 MG tablet Take 81 mg by mouth daily.  Marland Kitchen atenolol (TENORMIN) 50 MG tablet Take 1 tablet (50 mg total) by mouth daily.  . B Complex-C (SUPER B  COMPLEX PO) Take by mouth daily.  . cholecalciferol (VITAMIN D) 1000 UNITS tablet Take 5,000 Units by mouth every other day.   . citalopram (CELEXA) 40 MG tablet TAKE ONE TABLET BY MOUTH ONCE DAILY FOR  MOOD  . diltiazem (CARTIA XT) 240 MG 24 hr capsule TAKE ONE CAPSULE BY MOUTH ONCE DAILY FOR BLOOD PRESSURE  . fish oil-omega-3 fatty acids 1000 MG capsule Take 1 g by mouth daily.  . indapamide (LOZOL) 1.25 MG tablet TAKE ONE TABLET BY MOUTH TWICE DAILY FOR BLOOD PRESSURE AND  FLUID  . lisinopril (PRINIVIL,ZESTRIL) 20 MG tablet TAKE ONE TABLET BY MOUTH ONCE DAILY FOR BLOOD PRESSURE  . pravastatin (PRAVACHOL) 40 MG tablet TAKE ONE TABLET BY MOUTH ONCE DAILY  . ranitidine (ZANTAC) 300 MG tablet TAKE ONE TABLET BY MOUTH EVERY DAY AS NEEDED FOR  ACID  INDIGESTION   Current Problems (verified) Patient Active Problem List   Diagnosis Date Noted  . Atrial fibrillation 03/17/2014  . Vitamin D deficiency 06/08/2013  . Medication management 06/08/2013  . Hypertension   . Hyperlipemia   . Gout   . 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 OPHTHALMOLOGY EXAM  02/28/1945  . COLONOSCOPY  02/28/1987  . DEXA SCAN  02/27/2002  . PNA vac Low Risk Adult (2 of 2 - PCV13) 06/06/2004  . HEMOGLOBIN A1C  12/21/2014  . INFLUENZA VACCINE  02/28/2015  . FOOT EXAM  06/23/2015  . URINE MICROALBUMIN  06/23/2015  . TETANUS/TDAP  06/01/2017  . ZOSTAVAX  Completed    Immunization History  Administered Date(s) Administered  . Influenza Split 07/13/2014  . Pneumococcal-Unspecified 06/07/2003  . Td 06/02/2007  . Zoster 05/30/2012    Preventative care: Last colonoscopy: Refuses to have colonoscope  History reviewed: allergies, current medications, past family history, past medical history, past social history, past surgical history and problem list  Risk Factors: Tobacco History  Substance Use Topics  . Smoking status: Former Smoker    Quit date:  07/30/2006  . Smokeless tobacco: Not on file  . Alcohol Use: 1.0 oz/week    2 drink(s) per week   She does not smoke.  Patient is a former smoker. Are there smokers in your home (other than you)?  No Alcohol Current alcohol use: 2 liquor drinks per week(s)  Caffeine Current caffeine use: coffee 3 to 4 cups /day  Exercise Current exercise: walking and yard work  Nutrition/Diet Current diet: in general, a "healthy" diet    Cardiac risk factors: advanced age (older than 54 for men, 53 for women), diabetes mellitus, dyslipidemia, hypertension, sedentary lifestyle and smoking/ tobacco exposure.  Depression Screen (Note: if answer to either of the following is "Yes", a more complete depression screening is indicated)   Q1: Over the past two weeks, have you felt down, depressed or hopeless? No  Q2: Over the past two weeks, have you felt little interest or pleasure in doing things? No  Have you lost interest or pleasure in daily life? No  Do you often feel hopeless? No  Do you cry easily over simple problems? No  Activities of Daily Living In your present state of health, do you have any difficulty performing the following activities?:  Driving? No Managing money?  No Feeding yourself? No Getting from bed to chair? No Climbing a flight of stairs? No Preparing food and eating?: No Bathing or showering? No Getting dressed: No Getting to the toilet? No Using the toilet:No Moving around from place to place: No In the past year have you fallen or had a near fall?:No   Are you sexually active?  Yes  Do you have more than one partner?  No  Vision Difficulties: No  Hearing Difficulties: No Do you often ask people to speak up or repeat themselves? No Do you experience ringing or noises in your ears? No Do you have difficulty understanding soft or whispered voices? Sometimes.  Cognition  Do you feel that you have a problem with memory?No  Do you often misplace items? No  Do  you feel safe at home?  Yes  Advanced directives Does patient have a Tuttle? No Does patient have a Living Will? No  Past Medical History  Diagnosis Date  . A-fib   . Hypertension   . Hyperlipemia   . Arthritis   . Gout   . Anxiety   . Diabetes mellitus    Past Surgical History  Procedure Laterality Date  . Knee arthroscopy    . Abdominal hysterectomy    . Biopsy breast      (L) BREAST IN 1993    ROS: Constitutional: Denies fever, chills, weight loss/gain, headaches, insomnia, fatigue, night sweats, and change in appetite. Eyes: Denies redness, blurred vision, diplopia, discharge, itchy, watery eyes.  ENT: Denies discharge, congestion, post nasal drip, epistaxis, sore throat, earache, hearing loss, dental pain, Tinnitus, Vertigo, Sinus pain, snoring.  Cardio: Denies chest pain, palpitations, irregular heartbeat, syncope, dyspnea, diaphoresis, orthopnea, PND, claudication, edema Respiratory: denies cough, dyspnea, DOE, pleurisy, hoarseness, laryngitis, wheezing.  Gastrointestinal: Denies dysphagia, heartburn, reflux, water brash, pain, cramps, nausea, vomiting, bloating, diarrhea, constipation, hematemesis, melena, hematochezia, jaundice, hemorrhoids Genitourinary: Denies dysuria, frequency, urgency, nocturia, hesitancy, discharge, hematuria, flank pain Breast: Breast lumps, nipple discharge, bleeding.  Musculoskeletal: Denies arthralgia, myalgia, stiffness, Jt. Swelling, pain, limp, and strain/sprain. Denies falls. Skin: Denies puritis, rash, hives, warts, acne, eczema, changing in skin lesion Neuro: No weakness, tremor, incoordination, spasms, paresthesia, pain Psychiatric: Denies confusion, memory loss, sensory loss. Denies Depression. Endocrine: Denies change in weight, skin, hair change, nocturia, and paresthesia, diabetic polys, visual blurring, hyper / hypo glycemic episodes.  Heme/Lymph: No excessive bleeding, bruising, enlarged lymph  nodes  Objective:     BP 140/80   Pulse 56  Temp 97.7 F   Resp 16  Ht 5\' 5"    Wt 168 lb     BMI 27.96   General Appearance: Well nourished, alert, WD/WN, female and in no apparent distress. Eyes: PERRLA, EOMs, conjunctiva no swelling or erythema, normal fundi and vessels. Sinuses: No frontal/maxillary tenderness ENT/Mouth: EACs patent / TMs  nl. Nares clear without erythema, swelling, mucoid exudates. Oral hygiene is good. No erythema, swelling, or exudate. Tongue normal, non-obstructing. Tonsils not swollen or erythematous. Hearing normal.  Neck: Supple, thyroid normal. No bruits, nodes or JVD. Respiratory: Respiratory effort normal.  BS equal and clear bilateral without rales, rhonci, wheezing or stridor. Cardio: Heart sounds are normal with regular rate and rhythm and no murmurs, rubs or gallops. Peripheral pulses are normal and equal bilaterally without edema. No aortic or femoral bruits. Chest: symmetric with normal excursions and percussion. Abdomen: Flat, soft  with nl bowel sounds. Nontender, no guarding, rebound, hernias, masses, or organomegaly.  Lymphatics: Non tender without lymphadenopathy.   Musculoskeletal: Full ROM all peripheral extremities, joint stability, 5/5 strength, and normal gait. Skin: Warm and dry without rashes, lesions, cyanosis, clubbing or  ecchymosis.  Neuro: Cranial nerves intact, reflexes equal bilaterally. Normal muscle tone, no cerebellar symptoms. Sensation intact by monofilament testing to the toes bilaterally.  Pysch: Alert and oriented X 3, normal affect, Insight and Judgment appropriate.   Cognitive Testing  Alert? Yes  Normal Appearance?Yes  Oriented to person? Yes  Place? Yes   Time? Yes  Recall of three objects?  Yes  Can perform simple calculations? Yes  Displays appropriate judgment? Yes  Can read the correct time from a watch/clock?Yes  Medicare Attestation I have personally reviewed: The patient's medical and social history Their  use of alcohol, tobacco or illicit drugs Their current medications and supplements The patient's functional ability including ADLs,fall risks, home safety risks, cognitive, and hearing and visual impairment Diet and physical activities Evidence for depression or mood disorders  The patient's weight, height, BMI, and visual acuity have been recorded in the chart.  I have made referrals, counseling, and provided education to the patient based on review of the above and I have provided the patient with a written personalized care plan for preventive services.  Over 40 minutes of exam, counseling, chart review was performed.  Semaje Kinker DAVID, MD   01/07/2015

## 2015-01-08 LAB — BASIC METABOLIC PANEL WITH GFR
BUN: 22 mg/dL (ref 6–23)
CO2: 28 mEq/L (ref 19–32)
Calcium: 9.4 mg/dL (ref 8.4–10.5)
Chloride: 100 mEq/L (ref 96–112)
Creat: 0.83 mg/dL (ref 0.50–1.10)
GFR, EST NON AFRICAN AMERICAN: 68 mL/min
GFR, Est African American: 79 mL/min
Glucose, Bld: 121 mg/dL — ABNORMAL HIGH (ref 70–99)
Potassium: 4.4 mEq/L (ref 3.5–5.3)
SODIUM: 141 meq/L (ref 135–145)

## 2015-01-08 LAB — CBC WITH DIFFERENTIAL/PLATELET
Basophils Absolute: 0.1 10*3/uL (ref 0.0–0.1)
Basophils Relative: 1 % (ref 0–1)
EOS ABS: 0.1 10*3/uL (ref 0.0–0.7)
Eosinophils Relative: 1 % (ref 0–5)
HEMATOCRIT: 40.4 % (ref 36.0–46.0)
HEMOGLOBIN: 13.6 g/dL (ref 12.0–15.0)
LYMPHS ABS: 2.5 10*3/uL (ref 0.7–4.0)
LYMPHS PCT: 41 % (ref 12–46)
MCH: 32.8 pg (ref 26.0–34.0)
MCHC: 33.7 g/dL (ref 30.0–36.0)
MCV: 97.3 fL (ref 78.0–100.0)
MPV: 10.6 fL (ref 8.6–12.4)
Monocytes Absolute: 0.3 10*3/uL (ref 0.1–1.0)
Monocytes Relative: 5 % (ref 3–12)
NEUTROS ABS: 3.2 10*3/uL (ref 1.7–7.7)
NEUTROS PCT: 52 % (ref 43–77)
Platelets: 184 10*3/uL (ref 150–400)
RBC: 4.15 MIL/uL (ref 3.87–5.11)
RDW: 14.1 % (ref 11.5–15.5)
WBC: 6.2 10*3/uL (ref 4.0–10.5)

## 2015-01-08 LAB — TSH: TSH: 1.475 u[IU]/mL (ref 0.350–4.500)

## 2015-01-08 LAB — HEPATIC FUNCTION PANEL
ALK PHOS: 41 U/L (ref 39–117)
ALT: 20 U/L (ref 0–35)
AST: 21 U/L (ref 0–37)
Albumin: 3.9 g/dL (ref 3.5–5.2)
BILIRUBIN DIRECT: 0.1 mg/dL (ref 0.0–0.3)
Indirect Bilirubin: 0.6 mg/dL (ref 0.2–1.2)
TOTAL PROTEIN: 6.4 g/dL (ref 6.0–8.3)
Total Bilirubin: 0.7 mg/dL (ref 0.2–1.2)

## 2015-01-08 LAB — LIPID PANEL
CHOL/HDL RATIO: 3.2 ratio
Cholesterol: 164 mg/dL (ref 0–200)
HDL: 51 mg/dL (ref >=46)
LDL Cholesterol: 88 mg/dL (ref 0–99)
TRIGLYCERIDES: 127 mg/dL (ref <150)
VLDL: 25 mg/dL (ref 0–40)

## 2015-01-08 LAB — HEMOGLOBIN A1C
Hgb A1c MFr Bld: 6.5 % — ABNORMAL HIGH (ref <5.7)
MEAN PLASMA GLUCOSE: 140 mg/dL — AB (ref <117)

## 2015-01-08 LAB — MAGNESIUM: MAGNESIUM: 1.8 mg/dL (ref 1.5–2.5)

## 2015-01-08 LAB — INSULIN, RANDOM: INSULIN: 9.2 u[IU]/mL (ref 2.0–19.6)

## 2015-01-08 LAB — VITAMIN D 25 HYDROXY (VIT D DEFICIENCY, FRACTURES): VIT D 25 HYDROXY: 41 ng/mL (ref 30–100)

## 2015-01-10 ENCOUNTER — Other Ambulatory Visit: Payer: Self-pay | Admitting: Internal Medicine

## 2015-01-10 DIAGNOSIS — L03039 Cellulitis of unspecified toe: Secondary | ICD-10-CM

## 2015-01-10 MED ORDER — DOXYCYCLINE HYCLATE 100 MG PO CAPS: ORAL_CAPSULE | ORAL | Status: AC

## 2015-02-06 ENCOUNTER — Other Ambulatory Visit: Payer: Self-pay | Admitting: Internal Medicine

## 2015-02-25 DIAGNOSIS — E28310 Symptomatic premature menopause: Secondary | ICD-10-CM | POA: Diagnosis not present

## 2015-02-25 DIAGNOSIS — Z1231 Encounter for screening mammogram for malignant neoplasm of breast: Secondary | ICD-10-CM | POA: Diagnosis not present

## 2015-02-28 ENCOUNTER — Other Ambulatory Visit: Payer: Self-pay | Admitting: Internal Medicine

## 2015-03-02 DIAGNOSIS — I10 Essential (primary) hypertension: Secondary | ICD-10-CM | POA: Diagnosis not present

## 2015-03-02 DIAGNOSIS — E780 Pure hypercholesterolemia: Secondary | ICD-10-CM | POA: Diagnosis not present

## 2015-03-02 DIAGNOSIS — E1165 Type 2 diabetes mellitus with hyperglycemia: Secondary | ICD-10-CM | POA: Diagnosis not present

## 2015-04-05 ENCOUNTER — Other Ambulatory Visit: Payer: Self-pay | Admitting: Internal Medicine

## 2015-04-05 ENCOUNTER — Other Ambulatory Visit: Payer: Self-pay | Admitting: Physician Assistant

## 2015-04-14 ENCOUNTER — Ambulatory Visit (INDEPENDENT_AMBULATORY_CARE_PROVIDER_SITE_OTHER): Payer: Medicare Other | Admitting: Internal Medicine

## 2015-04-14 ENCOUNTER — Encounter: Payer: Self-pay | Admitting: Internal Medicine

## 2015-04-14 VITALS — BP 126/74 | HR 60 | Temp 98.2°F | Resp 18 | Ht 65.0 in | Wt 167.0 lb

## 2015-04-14 DIAGNOSIS — M1000 Idiopathic gout, unspecified site: Secondary | ICD-10-CM | POA: Diagnosis not present

## 2015-04-14 DIAGNOSIS — Z79899 Other long term (current) drug therapy: Secondary | ICD-10-CM

## 2015-04-14 DIAGNOSIS — Z23 Encounter for immunization: Secondary | ICD-10-CM | POA: Diagnosis not present

## 2015-04-14 DIAGNOSIS — I10 Essential (primary) hypertension: Secondary | ICD-10-CM

## 2015-04-14 DIAGNOSIS — E559 Vitamin D deficiency, unspecified: Secondary | ICD-10-CM

## 2015-04-14 DIAGNOSIS — E785 Hyperlipidemia, unspecified: Secondary | ICD-10-CM | POA: Diagnosis not present

## 2015-04-14 LAB — CBC WITH DIFFERENTIAL/PLATELET
BASOS ABS: 0.1 10*3/uL (ref 0.0–0.1)
Basophils Relative: 1 % (ref 0–1)
Eosinophils Absolute: 0.1 10*3/uL (ref 0.0–0.7)
Eosinophils Relative: 2 % (ref 0–5)
HEMATOCRIT: 41.7 % (ref 36.0–46.0)
HEMOGLOBIN: 14.3 g/dL (ref 12.0–15.0)
LYMPHS PCT: 41 % (ref 12–46)
Lymphs Abs: 2.8 10*3/uL (ref 0.7–4.0)
MCH: 33.1 pg (ref 26.0–34.0)
MCHC: 34.3 g/dL (ref 30.0–36.0)
MCV: 96.5 fL (ref 78.0–100.0)
MONO ABS: 0.4 10*3/uL (ref 0.1–1.0)
MPV: 10.2 fL (ref 8.6–12.4)
Monocytes Relative: 6 % (ref 3–12)
NEUTROS ABS: 3.4 10*3/uL (ref 1.7–7.7)
Neutrophils Relative %: 50 % (ref 43–77)
Platelets: 187 10*3/uL (ref 150–400)
RBC: 4.32 MIL/uL (ref 3.87–5.11)
RDW: 14 % (ref 11.5–15.5)
WBC: 6.8 10*3/uL (ref 4.0–10.5)

## 2015-04-14 LAB — BASIC METABOLIC PANEL WITH GFR
BUN: 24 mg/dL (ref 7–25)
CO2: 28 mmol/L (ref 20–31)
Calcium: 9.6 mg/dL (ref 8.6–10.4)
Chloride: 101 mmol/L (ref 98–110)
Creat: 0.85 mg/dL (ref 0.60–0.93)
GFR, EST NON AFRICAN AMERICAN: 66 mL/min (ref >=60)
GFR, Est African American: 76 mL/min (ref >=60)
GLUCOSE: 107 mg/dL — AB (ref 65–99)
POTASSIUM: 3.8 mmol/L (ref 3.5–5.3)
Sodium: 141 mmol/L (ref 135–146)

## 2015-04-14 LAB — LIPID PANEL
Cholesterol: 154 mg/dL (ref 125–200)
HDL: 48 mg/dL (ref >=46)
LDL Cholesterol: 82 mg/dL (ref <130)
TRIGLYCERIDES: 120 mg/dL (ref <150)
Total CHOL/HDL Ratio: 3.2 Ratio (ref <=5.0)
VLDL: 24 mg/dL (ref <30)

## 2015-04-14 LAB — HEPATIC FUNCTION PANEL
ALK PHOS: 41 U/L (ref 33–130)
ALT: 23 U/L (ref 6–29)
AST: 23 U/L (ref 10–35)
Albumin: 4.2 g/dL (ref 3.6–5.1)
BILIRUBIN INDIRECT: 0.6 mg/dL (ref 0.2–1.2)
Bilirubin, Direct: 0.1 mg/dL (ref <=0.2)
TOTAL PROTEIN: 6.5 g/dL (ref 6.1–8.1)
Total Bilirubin: 0.7 mg/dL (ref 0.2–1.2)

## 2015-04-14 LAB — MAGNESIUM: Magnesium: 1.8 mg/dL (ref 1.5–2.5)

## 2015-04-14 MED ORDER — CITALOPRAM HYDROBROMIDE 10 MG PO TABS: 10.0000 mg | ORAL_TABLET | Freq: Every day | ORAL | Status: DC

## 2015-04-14 NOTE — Progress Notes (Signed)
Assessment and Plan:  Hypertension:  -Continue medication -monitor blood pressure at home. -Continue DASH diet -Reminder to go to the ER if any CP, SOB, nausea, dizziness, severe HA, changes vision/speech, left arm numbness and tingling and jaw pain.  Cholesterol - Continue diet and exercise -Check cholesterol.   Diabetes with diabetic chronic kidney disease -Continue diet and exercise.  -Check A1C  Vitamin D Def -check level -continue medications.   Depression -taper from celexa to d/c  Continue diet and meds as discussed. Further disposition pending results of labs. Discussed med's effects and SE's.    HPI 80 y.o. female  presents for 3 month follow up with hypertension, hyperlipidemia, diabetes and vitamin D deficiency.   Her blood pressure has been controlled at home, today their BP is BP: 126/74 mmHg.She does not workout. She denies chest pain, shortness of breath, dizziness.   She is on cholesterol medication and denies myalgias. Her cholesterol is at goal. The cholesterol was:  01/07/2015: Cholesterol 164; HDL 51; LDL Cholesterol 88; Triglycerides 127   She has been working on diet and exercise for diabetes with diabetic chronic kidney disease, she is on bASA, she is on ACE/ARB, and denies  foot ulcerations, hyperglycemia, hypoglycemia , increased appetite, nausea, paresthesia of the feet, polydipsia, polyuria, visual disturbances, vomiting and weight loss. Last A1C was: 01/07/2015: Hgb A1c MFr Bld 6.5*.  She does have an endocrinologist who follows her in their clinic.     Patient is on Vitamin D supplement. 01/07/2015: Vit D, 25-Hydroxy 41  Patient reports that she has had some issues with her allergies and she started claritin and it has gotten a lot better.  She does want to come off of celexa.  She feels like she is happy with her husband and she doesn't feel like she needs it anymore.  She has cut it in half.  She is currently on 20 mg.  She would like to come off it  completely.  Since cutting it down she has had a lot more energy coming off the energy.    Current Medications:  Current Outpatient Prescriptions on File Prior to Visit  Medication Sig Dispense Refill  . allopurinol (ZYLOPRIM) 300 MG tablet TAKE ONE TABLET BY MOUTH ONCE DAILY TO  PREVENT  GOUT 90 tablet 1  . aspirin EC 81 MG tablet Take 81 mg by mouth daily.    Marland Kitchen atenolol (TENORMIN) 50 MG tablet TAKE ONE TABLET BY MOUTH ONCE DAILY 90 tablet 0  . B Complex-C (SUPER B COMPLEX PO) Take by mouth daily.    . cholecalciferol (VITAMIN D) 1000 UNITS tablet Take 5,000 Units by mouth every other day.     . citalopram (CELEXA) 40 MG tablet TAKE ONE TABLET BY MOUTH ONCE DAILY FOR  MOOD 90 tablet 1  . diltiazem (CARTIA XT) 240 MG 24 hr capsule TAKE ONE CAPSULE BY MOUTH ONCE DAILY FOR BLOOD PRESSURE 90 capsule 1  . fish oil-omega-3 fatty acids 1000 MG capsule Take 1 g by mouth daily.    . indapamide (LOZOL) 1.25 MG tablet TAKE ONE TABLET BY MOUTH TWICE DAILY FOR BLOOD PRESSURE AND  FLUID 180 tablet 1  . lisinopril (PRINIVIL,ZESTRIL) 20 MG tablet TAKE ONE TABLET BY MOUTH ONCE DAILY FOR BLOOD PRESSURE 90 tablet 99  . pravastatin (PRAVACHOL) 40 MG tablet TAKE ONE TABLET BY MOUTH ONCE DAILY 90 tablet 99  . ranitidine (ZANTAC) 300 MG tablet TAKE ONE TABLET BY MOUTH ONCE DAILY AS NEEDED FOR  ACID  INDIGESTION 90  tablet 0   No current facility-administered medications on file prior to visit.   Medical History:  Past Medical History  Diagnosis Date  . A-fib   . Hypertension   . Hyperlipemia   . Arthritis   . Gout   . Anxiety   . Diabetes mellitus    Allergies:  Allergies  Allergen Reactions  . Buspar [Buspirone]     dysphoria  . Lipitor [Atorvastatin]     Myalgias  . Loop Diuretics     Fatigue/weakness  . Metformin And Related     Gas/bloating  . Nsaids     GI upset  . Sulfa Antibiotics Other (See Comments)    Reaction=throat itching  . Sulfonamide Derivatives      Review of Systems:   Review of Systems  Constitutional: Negative for fever, chills and malaise/fatigue.  HENT: Negative for congestion, ear pain and sore throat.   Respiratory: Negative for cough, shortness of breath and wheezing.   Cardiovascular: Negative for chest pain, palpitations and PND.  Gastrointestinal: Negative for heartburn, diarrhea, constipation, blood in stool and melena.  Genitourinary: Negative.   Neurological: Negative for dizziness, sensory change, loss of consciousness and headaches.  Psychiatric/Behavioral: Negative for depression. The patient is not nervous/anxious and does not have insomnia.     Family history- Review and unchanged  Social history- Review and unchanged  Physical Exam: BP 126/74 mmHg  Pulse 60  Temp(Src) 98.2 F (36.8 C) (Temporal)  Resp 18  Ht 5\' 5"  (1.651 m)  Wt 167 lb (75.751 kg)  BMI 27.79 kg/m2 Wt Readings from Last 3 Encounters:  04/14/15 167 lb (75.751 kg)  01/07/15 168 lb (76.204 kg)  09/24/14 166 lb (75.297 kg)   General Appearance: Well nourished well developed, non-toxic appearing, in no apparent distress. Eyes: PERRLA, EOMs, conjunctiva no swelling or erythema ENT/Mouth: Ear canals clear with no erythema, swelling, or discharge.  TMs normal bilaterally, oropharynx clear, moist, with no exudate.   Neck: Supple, thyroid normal, no JVD, no cervical adenopathy.  Respiratory: Respiratory effort normal, breath sounds clear A&P, no wheeze, rhonchi or rales noted.  No retractions, no accessory muscle usage Cardio: RRR with no MRGs. No noted edema.  Abdomen: Soft, + BS.  Non tender, no guarding, rebound, hernias, masses. Musculoskeletal: Full ROM, 5/5 strength, Normal gait Skin: Warm, dry without rashes, lesions, ecchymosis.  Neuro: Awake and oriented X 3, Cranial nerves intact. No cerebellar symptoms.  Psych: normal affect, Insight and Judgment appropriate.    [name], PA-C 10:41 AM [facility]

## 2015-04-14 NOTE — Patient Instructions (Signed)
Please cut the celexa to 10 mg.  Take that for approximately 2 weeks to a month.  Cut celexa to 5 mg and take for 1-2 weeks.

## 2015-04-15 LAB — VITAMIN D 25 HYDROXY (VIT D DEFICIENCY, FRACTURES): Vit D, 25-Hydroxy: 48 ng/mL (ref 30–100)

## 2015-04-15 LAB — TSH: TSH: 1.903 u[IU]/mL (ref 0.350–4.500)

## 2015-05-02 ENCOUNTER — Other Ambulatory Visit: Payer: Self-pay | Admitting: Internal Medicine

## 2015-05-07 ENCOUNTER — Encounter: Payer: Self-pay | Admitting: *Deleted

## 2015-05-13 ENCOUNTER — Telehealth: Payer: Self-pay | Admitting: *Deleted

## 2015-05-13 NOTE — Telephone Encounter (Signed)
Patient called and states she is having trouble sleeping since her spouse passed away on 05-10-2015.  Per Dr Melford Aase OK to take Alprazolam 0.5 mg 1 to 2 tabs at bedtime.  Patient states she had refilled her spouse's RX last week for this drug and she will try that for sleep.

## 2015-05-14 ENCOUNTER — Other Ambulatory Visit: Payer: Self-pay | Admitting: Internal Medicine

## 2015-06-14 ENCOUNTER — Telehealth: Payer: Self-pay | Admitting: *Deleted

## 2015-06-14 NOTE — Telephone Encounter (Signed)
Patient called and states she stepped on a large needle that went through her shoe and into her foot.  Per Dr Melford Aase, her DT is up to date but, she should monitor the are for redness and infection.  Patient aware.

## 2015-07-05 ENCOUNTER — Other Ambulatory Visit: Payer: Self-pay | Admitting: Physician Assistant

## 2015-07-06 ENCOUNTER — Other Ambulatory Visit: Payer: Self-pay | Admitting: Internal Medicine

## 2015-07-07 ENCOUNTER — Encounter: Payer: Self-pay | Admitting: Internal Medicine

## 2015-07-25 ENCOUNTER — Encounter: Payer: Self-pay | Admitting: *Deleted

## 2015-08-09 ENCOUNTER — Encounter: Payer: Self-pay | Admitting: Internal Medicine

## 2015-08-11 ENCOUNTER — Other Ambulatory Visit: Payer: Self-pay | Admitting: Internal Medicine

## 2015-08-22 ENCOUNTER — Other Ambulatory Visit: Payer: Self-pay | Admitting: Internal Medicine

## 2015-08-26 ENCOUNTER — Other Ambulatory Visit: Payer: Self-pay | Admitting: *Deleted

## 2015-08-26 MED ORDER — ATENOLOL 50 MG PO TABS: 50.0000 mg | ORAL_TABLET | Freq: Every day | ORAL | Status: DC

## 2015-08-26 MED ORDER — PRAVASTATIN SODIUM 40 MG PO TABS: 40.0000 mg | ORAL_TABLET | Freq: Every day | ORAL | Status: DC

## 2015-08-26 MED ORDER — RANITIDINE HCL 300 MG PO TABS: ORAL_TABLET | ORAL | Status: DC

## 2015-08-26 MED ORDER — LISINOPRIL 20 MG PO TABS: ORAL_TABLET | ORAL | Status: DC

## 2015-08-26 MED ORDER — INDAPAMIDE 1.25 MG PO TABS: ORAL_TABLET | ORAL | Status: DC

## 2015-08-26 MED ORDER — DILTIAZEM HCL ER COATED BEADS 240 MG PO CP24: ORAL_CAPSULE | ORAL | Status: DC

## 2015-08-26 MED ORDER — ALLOPURINOL 300 MG PO TABS: ORAL_TABLET | ORAL | Status: DC

## 2015-09-06 ENCOUNTER — Other Ambulatory Visit: Payer: Self-pay

## 2015-09-06 ENCOUNTER — Encounter: Payer: Self-pay | Admitting: Internal Medicine

## 2015-09-06 ENCOUNTER — Ambulatory Visit (INDEPENDENT_AMBULATORY_CARE_PROVIDER_SITE_OTHER): Payer: Medicare Other | Admitting: Internal Medicine

## 2015-09-06 VITALS — BP 132/80 | HR 68 | Temp 97.9°F | Resp 16 | Ht 65.5 in | Wt 160.8 lb

## 2015-09-06 DIAGNOSIS — K219 Gastro-esophageal reflux disease without esophagitis: Secondary | ICD-10-CM

## 2015-09-06 DIAGNOSIS — M109 Gout, unspecified: Secondary | ICD-10-CM | POA: Diagnosis not present

## 2015-09-06 DIAGNOSIS — M1000 Idiopathic gout, unspecified site: Secondary | ICD-10-CM

## 2015-09-06 DIAGNOSIS — E559 Vitamin D deficiency, unspecified: Secondary | ICD-10-CM

## 2015-09-06 DIAGNOSIS — Z1212 Encounter for screening for malignant neoplasm of rectum: Secondary | ICD-10-CM

## 2015-09-06 DIAGNOSIS — Z79899 Other long term (current) drug therapy: Secondary | ICD-10-CM

## 2015-09-06 DIAGNOSIS — I480 Paroxysmal atrial fibrillation: Secondary | ICD-10-CM | POA: Diagnosis not present

## 2015-09-06 DIAGNOSIS — E1129 Type 2 diabetes mellitus with other diabetic kidney complication: Secondary | ICD-10-CM | POA: Diagnosis not present

## 2015-09-06 DIAGNOSIS — E1122 Type 2 diabetes mellitus with diabetic chronic kidney disease: Secondary | ICD-10-CM

## 2015-09-06 DIAGNOSIS — E785 Hyperlipidemia, unspecified: Secondary | ICD-10-CM | POA: Diagnosis not present

## 2015-09-06 DIAGNOSIS — I10 Essential (primary) hypertension: Secondary | ICD-10-CM

## 2015-09-06 DIAGNOSIS — N182 Chronic kidney disease, stage 2 (mild): Secondary | ICD-10-CM

## 2015-09-06 DIAGNOSIS — Z1389 Encounter for screening for other disorder: Secondary | ICD-10-CM

## 2015-09-06 DIAGNOSIS — Z1331 Encounter for screening for depression: Secondary | ICD-10-CM

## 2015-09-06 LAB — CBC WITH DIFFERENTIAL/PLATELET
BASOS ABS: 0.1 10*3/uL (ref 0.0–0.1)
BASOS PCT: 1 % (ref 0–1)
Eosinophils Absolute: 0.1 10*3/uL (ref 0.0–0.7)
Eosinophils Relative: 1 % (ref 0–5)
HEMATOCRIT: 41.6 % (ref 36.0–46.0)
HEMOGLOBIN: 13.9 g/dL (ref 12.0–15.0)
LYMPHS PCT: 33 % (ref 12–46)
Lymphs Abs: 2.7 10*3/uL (ref 0.7–4.0)
MCH: 32.5 pg (ref 26.0–34.0)
MCHC: 33.4 g/dL (ref 30.0–36.0)
MCV: 97.2 fL (ref 78.0–100.0)
MONO ABS: 0.5 10*3/uL (ref 0.1–1.0)
MPV: 9.9 fL (ref 8.6–12.4)
Monocytes Relative: 6 % (ref 3–12)
NEUTROS ABS: 4.8 10*3/uL (ref 1.7–7.7)
NEUTROS PCT: 59 % (ref 43–77)
Platelets: 203 10*3/uL (ref 150–400)
RBC: 4.28 MIL/uL (ref 3.87–5.11)
RDW: 14.1 % (ref 11.5–15.5)
WBC: 8.1 10*3/uL (ref 4.0–10.5)

## 2015-09-06 NOTE — Patient Instructions (Signed)
Recommend Adult Low Dose Aspirin or   coated  Aspirin 81 mg daily   To reduce risk of Colon Cancer 20 %,   Skin Cancer 26 % ,   Melanoma 46%   and   Pancreatic cancer 60%   ++++++++++++++++++++++++++++++++++++++++++++++++++++++ Vitamin D goal   is between 70-100.   Please make sure that you are taking your Vitamin D as directed.   It is very important as a natural anti-inflammatory   helping hair, skin, and nails, as well as reducing stroke and heart attack risk.   It helps your bones and helps with mood.  It also decreases numerous cancer risks so please take it as directed.   Low Vit D is associated with a 200-300% higher risk for CANCER   and 200-300% higher risk for HEART   ATTACK  &  STROKE.   .....................................Marland Kitchen  It is also associated with higher death rate at younger ages,   autoimmune diseases like Rheumatoid arthritis, Lupus, Multiple Sclerosis.     Also many other serious conditions, like depression, Alzheimer's  Dementia, infertility, muscle aches, fatigue, fibromyalgia - just to name a few.  ++++++++++++++++++++++++++++++++++++++++++++++++  Recommend the book "The END of DIETING" by Dr Excell Seltzer   & the book "The END of DIABETES " by Dr Excell Seltzer  At [email or web address] - get book & Audio CD's     Being diabetic has a  300% increased risk for heart attack, stroke, cancer, and alzheimer- type vascular dementia. It is very important that you work harder with diet by avoiding all foods that are white. Avoid white rice (brown & wild rice is OK), white potatoes (sweetpotatoes in moderation is OK), White bread or wheat bread or anything made out of white flour like bagels, donuts, rolls, buns, biscuits, cakes, pastries, cookies, pizza crust, and pasta (made from white flour & egg whites) - vegetarian pasta or spinach or wheat pasta is OK. Multigrain breads like Arnold's or Pepperidge [employer], or multigrain sandwich thins or flatbreads.  Diet,  exercise and weight loss can reverse and cure diabetes in the early stages.  Diet, exercise and weight loss is very important in the control and prevention of complications of diabetes which affects every system in your body, ie. Brain - dementia/stroke, eyes - glaucoma/blindness, heart - heart attack/heart failure, kidneys - dialysis, stomach - gastric paralysis, intestines - malabsorption, nerves - severe painful neuritis, circulation - gangrene & loss of a leg(s), and finally cancer and Alzheimers.    I recommend avoid fried & greasy foods,  sweets/candy, white rice (brown or wild rice or Quinoa is OK), white potatoes (sweet potatoes are OK) - anything made from white flour - bagels, doughnuts, rolls, buns, biscuits,white and wheat breads, pizza crust and traditional pasta made of white flour & egg white(vegetarian pasta or spinach or wheat pasta is OK).  Multi-grain bread is OK - like multi-grain flat bread or sandwich thins. Avoid alcohol in excess. Exercise is also important.    Eat all the vegetables you want - avoid meat, especially red meat and dairy - especially cheese.  Cheese is the most concentrated form of trans-fats which is the worst thing to clog up our arteries. Veggie cheese is OK which can be found in the fresh produce section at [employer] or [employer] or [employer]  ++++++++++++++++++++++++++++++++++++++++++++++++++ DASH Eating Plan  DASH stands for "Dietary Approaches to Stop Hypertension."   The DASH eating plan is a healthy eating plan that has been shown to reduce high blood  pressure (hypertension). Additional health benefits may include reducing the risk of type 2 diabetes mellitus, heart disease, and stroke. The DASH eating plan may also help with weight loss.  WHAT DO I NEED TO KNOW ABOUT THE DASH EATING PLAN?  For the DASH eating plan, you will follow these general guidelines:  Choose foods with a percent daily value for sodium of less than 5% (as listed on the food  label).  Use salt-free seasonings or herbs instead of table salt or sea salt.  Check with your health care provider or pharmacist before using salt substitutes.  Eat lower-sodium products, often labeled as "lower sodium" or "no salt added."  Eat fresh foods.  Eat more vegetables, fruits, and low-fat dairy products.    Choose whole grains. Look for the word "whole" as the first word in the ingredient list.  Choose fish   Limit sweets, desserts, sugars, and sugary drinks.  Choose heart-healthy fats.  Eat veggie cheese   Eat more home-cooked food and less restaurant, buffet, and fast food.  Limit fried foods.  [place]uffaker foods using methods other than frying.  Limit canned vegetables. If you do use them, rinse them well to decrease the sodium.  When eating at a restaurant, ask that your food be prepared with less salt, or no salt if possible.                      WHAT FOODS CAN I EAT?  Read Dr Fara Olden Fuhrman's books on The End of Dieting & The End of Diabetes  Grains  Whole grain or whole wheat bread. Brown rice. Whole grain or whole wheat pasta. Quinoa, bulgur, and whole grain cereals. Low-sodium cereals. Corn or whole wheat flour tortillas. Whole grain cornbread. Whole grain crackers. Low-sodium crackers.  Vegetables  Fresh or frozen vegetables (raw, steamed, roasted, or grilled). Low-sodium or reduced-sodium tomato and vegetable juices. Low-sodium or reduced-sodium tomato sauce and paste. Low-sodium or reduced-sodium canned vegetables.   Fruits  All fresh, canned (in natural juice), or frozen fruits.  Protein Products   All fish and seafood.  Dried beans, peas, or lentils. Unsalted nuts and seeds. Unsalted canned beans.  Dairy  Low-fat dairy products, such as skim or 1% milk, 2% or reduced-fat cheeses, low-fat ricotta or cottage cheese, or plain low-fat yogurt. Low-sodium or reduced-sodium cheeses.  Fats and Oils  Tub margarines without trans fats. Light or  reduced-fat mayonnaise and salad dressings (reduced sodium). Avocado. Safflower, olive, or canola oils. Natural peanut or almond butter.  Other  Unsalted popcorn and pretzels. The items listed above may not be a complete list of recommended foods or beverages. Contact your dietitian for more options.  +++++++++++++++++++++++++++++++++++++++++++  WHAT FOODS ARE NOT RECOMMENDED?  Grains/ White flour or wheat flour  White bread. White pasta. White rice. Refined cornbread. Bagels and croissants. Crackers that contain trans fat.  Vegetables  Creamed or fried vegetables. Vegetables in a . Regular canned vegetables. Regular canned tomato sauce and paste. Regular tomato and vegetable juices.  Fruits  Dried fruits. Canned fruit in light or heavy syrup. Fruit juice.  Meat and Other Protein Products  Meat in general - RED mwaet & White meat.  Fatty cuts of meat. Ribs, chicken wings, bacon, sausage, bologna, salami, chitterlings, fatback, hot dogs, bratwurst, and packaged luncheon meats.  Dairy  Whole or 2% milk, cream, half-and-half, and cream cheese. Whole-fat or sweetened yogurt. Full-fat cheeses or blue cheese. Nondairy creamers and whipped toppings. Processed cheese, cheese spreads, or  cheese curds.  Condiments  Onion and garlic salt, seasoned salt, table salt, and sea salt. Canned and packaged gravies. Worcestershire sauce. Tartar sauce. Barbecue sauce. Teriyaki sauce. Soy sauce, including reduced sodium. Steak sauce. Fish sauce. Oyster sauce. Cocktail sauce. Horseradish. Ketchup and mustard. Meat flavorings and tenderizers. Bouillon cubes. Hot sauce. Tabasco sauce. Marinades. Taco seasonings. Relishes.  Fats and Oils Butter, stick margarine, lard, shortening and bacon fat. Coconut, palm kernel, or palm oils. Regular salad dressings.  Pickles and olives. Salted popcorn and pretzels.  The items listed above may not be a complete list of foods and beverages to avoid.   Preventive  Care for Adults  A healthy lifestyle and preventive care can promote health and wellness. Preventive health guidelines for women include the following key practices.  A routine yearly physical is a good way to check with your health care provider about your health and preventive screening. It is a chance to share any concerns and updates on your health and to receive a thorough exam.  Visit your dentist for a routine exam and preventive care every 6 months. Brush your teeth twice a day and floss once a day. Good oral hygiene prevents tooth decay and gum disease.  The frequency of eye exams is based on your age, health, family medical history, use of contact lenses, and other factors. Follow your health care provider's recommendations for frequency of eye exams.  Eat a healthy diet. Foods like vegetables, fruits, whole grains, low-fat dairy products, and lean protein foods contain the nutrients you need without too many calories. Decrease your intake of foods high in solid fats, added sugars, and salt. Eat the right amount of calories for you.Get information about a proper diet from your health care provider, if necessary.  Regular physical exercise is one of the most important things you can do for your health. Most adults should get at least 150 minutes of moderate-intensity exercise (any activity that increases your heart rate and causes you to sweat) each week. In addition, most adults need muscle-strengthening exercises on 2 or more days a week.  Maintain a healthy weight. The body mass index (BMI) is a screening tool to identify possible weight problems. It provides an estimate of body fat based on height and weight. Your health care provider can find your BMI and can help you achieve or maintain a healthy weight.For adults 20 years and older:  A BMI below 18.5 is considered underweight.  A BMI of 18.5 to 24.9 is normal.  A BMI of 25 to 29.9 is considered overweight.  A BMI of 30 and  above is considered obese.  Maintain normal blood lipids and cholesterol levels by exercising and minimizing your intake of saturated fat. Eat a balanced diet with plenty of fruit and vegetables. If your lipid or cholesterol levels are high, you are over 50, or you are at high risk for heart disease, you may need your cholesterol levels checked more frequently.Ongoing high lipid and cholesterol levels should be treated with medicines if diet and exercise are not working.  If you smoke, find out from your health care provider how to quit. If you do not use tobacco, do not start.  Lung cancer screening is recommended for adults aged 69-80 years who are at high risk for developing lung cancer because of a history of smoking. A yearly low-dose CT scan of the lungs is recommended for people who have at least a 30-pack-year history of smoking and are a current smoker or  have quit within the past 15 years. A pack year of smoking is smoking an average of 1 pack of cigarettes a day for 1 year (for example: 1 pack a day for 30 years or 2 packs a day for 15 years). Yearly screening should continue until the smoker has stopped smoking for at least 15 years. Yearly screening should be stopped for people who develop a health problem that would prevent them from having lung cancer treatment.  Avoid use of street drugs. Do not share needles with anyone. Ask for help if you need support or instructions about stopping the use of drugs.  High blood pressure causes heart disease and increases the risk of stroke.  Ongoing high blood pressure should be treated with medicines if weight loss and exercise do not work.  If you are 52-67 years old, ask your health care provider if you should take aspirin to prevent strokes.  Diabetes screening involves taking a blood sample to check your fasting blood sugar level. This should be done once every 3 years, after age 9, if you are within normal weight and without risk factors for  diabetes. Testing should be considered at a younger age or be carried out more frequently if you are overweight and have at least 1 risk factor for diabetes.  Breast cancer screening is essential preventive care for women. You should practice "breast self-awareness." This means understanding the normal appearance and feel of your breasts and may include breast self-examination. Any changes detected, no matter how small, should be reported to a health care provider. Women in their 45s and 30s should have a clinical breast exam (CBE) by a health care provider as part of a regular health exam every 1 to 3 years. After age 76, women should have a CBE every year. Starting at age 54, women should consider having a mammogram (breast X-ray test) every year. Women who have a family history of breast cancer should talk to their health care provider about genetic screening. Women at a high risk of breast cancer should talk to their health care providers about having an MRI and a mammogram every year.  Breast cancer gene (BRCA)-related cancer risk assessment is recommended for women who have family members with BRCA-related cancers. BRCA-related cancers include breast, ovarian, tubal, and peritoneal cancers. Having family members with these cancers may be associated with an increased risk for harmful changes (mutations) in the breast cancer genes BRCA1 and BRCA2. Results of the assessment will determine the need for genetic counseling and BRCA1 and BRCA2 testing.  Routine pelvic exams to screen for cancer are no longer recommended for nonpregnant women who are considered low risk for cancer of the pelvic organs (ovaries, uterus, and vagina) and who do not have symptoms. Ask your health care provider if a screening pelvic exam is right for you.  If you have had past treatment for cervical cancer or a condition that could lead to cancer, you need Pap tests and screening for cancer for at least 20 years after your  treatment. If Pap tests have been discontinued, your risk factors (such as having a new sexual partner) need to be reassessed to determine if screening should be resumed. Some women have medical problems that increase the chance of getting cervical cancer. In these cases, your health care provider may recommend more frequent screening and Pap tests.    Colorectal cancer can be detected and often prevented. Most routine colorectal cancer screening begins at the age of 73 years and  continues through age 75 years. However, your health care provider may recommend screening at an earlier age if you have risk factors for colon cancer. On a yearly basis, your health care provider may provide home test kits to check for hidden blood in the stool. Use of a small camera at the end of a tube, to directly examine the colon (sigmoidoscopy or colonoscopy), can detect the earliest forms of colorectal cancer. Talk to your health care provider about this at age 50, when routine screening begins. Direct exam of the colon should be repeated every 5-10 years through age 75 years, unless early forms of pre-cancerous polyps or small growths are found.  Osteoporosis is a disease in which the bones lose minerals and strength with aging. This can result in serious bone fractures or breaks. The risk of osteoporosis can be identified using a bone density scan. Women ages 65 years and over and women at risk for fractures or osteoporosis should discuss screening with their health care providers. Ask your health care provider whether you should take a calcium supplement or vitamin D to reduce the rate of osteoporosis.  Menopause can be associated with physical symptoms and risks. Hormone replacement therapy is available to decrease symptoms and risks. You should talk to your health care provider about whether hormone replacement therapy is right for you.  Use sunscreen. Apply sunscreen liberally and repeatedly throughout the day. You  should seek shade when your shadow is shorter than you. Protect yourself by wearing long sleeves, pants, a wide-brimmed hat, and sunglasses year round, whenever you are outdoors.  Once a month, do a whole body skin exam, using a mirror to look at the skin on your back. Tell your health care provider of new moles, moles that have irregular borders, moles that are larger than a pencil eraser, or moles that have changed in shape or color.  Stay current with required vaccines (immunizations).  Influenza vaccine. All adults should be immunized every year.  Tetanus, diphtheria, and acellular pertussis (Td, Tdap) vaccine. Pregnant women should receive 1 dose of Tdap vaccine during each pregnancy. The dose should be obtained regardless of the length of time since the last dose. Immunization is preferred during the 27th-36th week of gestation. An adult who has not previously received Tdap or who does not know her vaccine status should receive 1 dose of Tdap. This initial dose should be followed by tetanus and diphtheria toxoids (Td) booster doses every 10 years. Adults with an unknown or incomplete history of completing a 3-dose immunization series with Td-containing vaccines should begin or complete a primary immunization series including a Tdap dose. Adults should receive a Td booster every 10 years.    Zoster vaccine. One dose is recommended for adults aged 60 years or older unless certain conditions are present.    Pneumococcal 13-valent conjugate (PCV13) vaccine. When indicated, a person who is uncertain of her immunization history and has no record of immunization should receive the PCV13 vaccine. An adult aged 19 years or older who has certain medical conditions and has not been previously immunized should receive 1 dose of PCV13 vaccine. This PCV13 should be followed with a dose of pneumococcal polysaccharide (PPSV23) vaccine. The PPSV23 vaccine dose should be obtained at least 8 weeks after the dose  of PCV13 vaccine. An adult aged 19 years or older who has certain medical conditions and previously received 1 or more doses of PPSV23 vaccine should receive 1 dose of PCV13. The PCV13 vaccine dose should   be obtained 1 or more years after the last PPSV23 vaccine dose.    Pneumococcal polysaccharide (PPSV23) vaccine. When PCV13 is also indicated, PCV13 should be obtained first. All adults aged 65 years and older should be immunized. An adult younger than age 65 years who has certain medical conditions should be immunized. Any person who resides in a nursing home or long-term care facility should be immunized. An adult smoker should be immunized. People with an immunocompromised condition and certain other conditions should receive both PCV13 and PPSV23 vaccines. People with human immunodeficiency virus (HIV) infection should be immunized as soon as possible after diagnosis. Immunization during chemotherapy or radiation therapy should be avoided. Routine use of PPSV23 vaccine is not recommended for American Indians, [place] Natives, or people younger than 65 years unless there are medical conditions that require PPSV23 vaccine. When indicated, people who have unknown immunization and have no record of immunization should receive PPSV23 vaccine. One-time revaccination 5 years after the first dose of PPSV23 is recommended for people aged 19-64 years who have chronic kidney failure, nephrotic syndrome, asplenia, or immunocompromised conditions. People who received 1-2 doses of PPSV23 before age 65 years should receive another dose of PPSV23 vaccine at age 65 years or later if at least 5 years have passed since the previous dose. Doses of PPSV23 are not needed for people immunized with PPSV23 at or after age 65 years.   Preventive Services / Frequency  Ages 65 years and over  Blood pressure check.  Lipid and cholesterol check.  Lung cancer screening. / Every year if you are aged 55-80 years and have a  30-pack-year history of smoking and currently smoke or have quit within the past 15 years. Yearly screening is stopped once you have quit smoking for at least 15 years or develop a health problem that would prevent you from having lung cancer treatment.  Clinical breast exam.** / Every year after age 40 years.  BRCA-related cancer risk assessment.** / For women who have family members with a BRCA-related cancer (breast, ovarian, tubal, or peritoneal cancers).  Mammogram.** / Every year beginning at age 40 years and continuing for as long as you are in good health. Consult with your health care provider.  Pap test.** / Every 3 years starting at age 30 years through age 65 or 70 years with 3 consecutive normal Pap tests. Testing can be stopped between 65 and 70 years with 3 consecutive normal Pap tests and no abnormal Pap or HPV tests in the past 10 years.  Fecal occult blood test (FOBT) of stool. / Every year beginning at age 50 years and continuing until age 75 years. You may not need to do this test if you get a colonoscopy every 10 years.  Flexible sigmoidoscopy or colonoscopy.** / Every 5 years for a flexible sigmoidoscopy or every 10 years for a colonoscopy beginning at age 50 years and continuing until age 75 years.  Hepatitis C blood test.** / For all people born from 1945 through 1965 and any individual with known risks for hepatitis C.  Osteoporosis screening.** / A one-time screening for women ages 65 years and over and women at risk for fractures or osteoporosis.  Skin self-exam. / Monthly.  Influenza vaccine. / Every year.  Tetanus, diphtheria, and acellular pertussis (Tdap/Td) vaccine.** / 1 dose of Td every 10 years.  Zoster vaccine.** / 1 dose for adults aged 60 years or older.  Pneumococcal 13-valent conjugate (PCV13) vaccine.** / Consult your health care provider.    Pneumococcal polysaccharide (PPSV23) vaccine.** / 1 dose for all adults aged 97 years and older. Screening  for abdominal aortic aneurysm (AAA)  by ultrasound is recommended for people who have history of high blood pressure or who are current or former smokers.

## 2015-09-06 NOTE — Progress Notes (Signed)
Patient ID: Ruth Delgado, female   DOB: 1935/06/16, 81 y.o.   MRN: [id number]  Comprehensive Evaluation &  Examination  This very nice 81 y.o. WWF presents for presents for a  comprehensive evaluation and management of multiple medical co-morbidities.  Patient has been followed for HTN, ASHD/pAfib,  Hyperlipidemia and Vitamin D Deficiency. Patient has prior hx/o Gout apparently controlled on allopurinol.     HTN predates since 15. Patient's BP has been controlled at home and patient denies any cardiac symptoms as chest pain, palpitations, shortness of breath, dizziness or ankle swelling. Patient has remote hx/o pAfib and since on Diltiazem she apparently has done well. Today's BP: 132/80 mmHg    Patient's hyperlipidemia is controlled with diet and medications. Patient denies myalgias or other medication SE's. Last lipids were at goal with Cholesterol 154; HDL 48; LDL 82; Triglycerides 120 on 04/14/2015.   Patient has T2_NIDDM predating since 2007 which she has managed with diet as she had been GI intolerant to Metformin.  Patient denies reactive hypoglycemic symptoms, visual blurring, diabetic polys, or paresthesias. Last A1c was 6.5% on 01/07/2015.   Finally, patient has history of Vitamin D Deficiency of "67" in 2008 and last Vitamin D was 48 on 04/14/2015.    Medication Sig  . allopurinol  300 MG  TAKE ONE TABLET BY MOUTH ONCE DAILY TO  PREVENT  GOUT  . aspirin EC 81 MG Take 81 mg by mouth daily.  . Atenolol 50 MG Take 1 tablet (50 mg total) by mouth daily.  . SUPER B COMPLEX  Take by mouth daily.  Marland Kitchen VITAMIN D 1000 UNITS  Take 5,000 Units by mouth every other day.   . Diltiazem XT 240 MG 24 hr TAKE ONE CAPSULE BY MOUTH ONCE DAILY FOR BLOOD PRESSURE  . fish oil-omega-3  1000 MG  Take 1 g by mouth daily.  . indapamide  1.25 MG TAKE ONE TABLET BY MOUTH TWICE DAILY FOR BLOOD PRESSURE AND FLUID  . lisinopril  20 MG  TAKE ONE TABLET BY MOUTH ONCE DAILY FOR BLOOD PRESSURE  . pravastatin  40 MG   Take 1 tablet (40 mg total) by mouth daily.  . ranitidine 300 MG TAKE ONE TABLET BY MOUTH ONCE DAILY AS NEEDED FOR  ACID  INDIGESTION   Allergies  Allergen Reactions  . Buspar [Buspirone]     dysphoria  . Lipitor [Atorvastatin]     Myalgias  . Loop Diuretics     Fatigue/weakness  . Metformin And Related     Gas/bloating  . Nsaids     GI upset  . Sulfa Antibiotics Other (See Comments)    Reaction=throat itching  . Sulfonamide Derivatives    Past Medical History  Diagnosis Date  . A-fib ([place])   . Hypertension   . Hyperlipemia   . Arthritis   . Gout   . Anxiety   . Diabetes mellitus    Health Maintenance  Topic Date Due  . OPHTHALMOLOGY EXAM  02/28/1947  . PNA vac Low Risk Adult (2 of 2 - PCV13) 06/06/2004  . FOOT EXAM  06/23/2015  . HEMOGLOBIN A1C  07/09/2015  . INFLUENZA VACCINE  02/28/2016  . TETANUS/TDAP  06/01/2017  . DEXA SCAN  Completed  . ZOSTAVAX  Completed   Immunization History  Administered Date(s) Administered  . Influenza Split 07/13/2014  . Influenza, High Dose Seasonal PF 04/14/2015  . Pneumococcal-Unspecified 06/07/2003  . Td 06/02/2007  . Zoster 05/30/2012   Past Surgical History  Procedure Laterality  Date  . Knee arthroscopy    . Abdominal hysterectomy    . Biopsy breast      (L) BREAST IN 1993   Family History  Problem Relation Age of Onset  . Diabetes Mother   . Hypertension Father   . CVA Father    Social History  Substance Use Topics  . Smoking status: Former Smoker    Quit date: 07/30/2006  . Smokeless tobacco: None  . Alcohol Use: 1.0 oz/week    2 drink(s) per week    ROS Constitutional: Denies fever, chills, weight loss/gain, headaches, insomnia,  night sweats, and change in appetite. Does c/o fatigue. Eyes: Denies redness, blurred vision, diplopia, discharge, itchy, watery eyes.  ENT: Denies discharge, congestion, post nasal drip, epistaxis, sore throat, earache, hearing loss, dental pain, Tinnitus, Vertigo, Sinus pain,  snoring.  Cardio: Denies chest pain, palpitations, irregular heartbeat, syncope, dyspnea, diaphoresis, orthopnea, PND, claudication, edema Respiratory: denies cough, dyspnea, DOE, pleurisy, hoarseness, laryngitis, wheezing.  Gastrointestinal: Denies dysphagia, heartburn, reflux, water brash, pain, cramps, nausea, vomiting, bloating, diarrhea, constipation, hematemesis, melena, hematochezia, jaundice, hemorrhoids Genitourinary: Denies dysuria, frequency, urgency, nocturia, hesitancy, discharge, hematuria, flank pain Breast: Breast lumps, nipple discharge, bleeding.  Musculoskeletal: Denies arthralgia, myalgia, stiffness, Jt. Swelling, pain, limp, and strain/sprain. Denies falls. Skin: Denies puritis, rash, hives, warts, acne, eczema, changing in skin lesion Neuro: No weakness, tremor, incoordination, spasms, paresthesia, pain Psychiatric: Denies confusion, memory loss, sensory loss. Denies Depression. Endocrine: Denies change in weight, skin, hair change, nocturia, and paresthesia, diabetic polys, visual blurring, hyper / hypo glycemic episodes.  Heme/Lymph: No excessive bleeding, bruising, enlarged lymph nodes.  Physical Exam  BP 132/80 mmHg  Pulse 68  Temp(Src) 97.9 F (36.6 C)  Resp 16  Ht 5' 5.5" (1.664 m)  Wt 160 lb 12.8 oz (72.938 kg)  BMI 26.34 kg/m2  SpO2 95%  General Appearance: Well nourished and in no apparent distress. Eyes: PERRLA, EOMs, conjunctiva no swelling or erythema, normal fundi and vessels. Sinuses: No frontal/maxillary tenderness ENT/Mouth: EACs patent / TMs  nl. Nares clear without erythema, swelling, mucoid exudates. Oral hygiene is good. No erythema, swelling, or exudate. Tongue normal, non-obstructing. Tonsils not swollen or erythematous. Hearing normal.  Neck: Supple, thyroid normal. No bruits, nodes or JVD. Respiratory: Respiratory effort normal.  BS equal and clear bilateral without rales, rhonci, wheezing or stridor. Cardio: Heart sounds are normal with  regular rate and rhythm and no murmurs, rubs or gallops. Peripheral pulses are normal and equal bilaterally without edema. No aortic or femoral bruits. Chest: symmetric with normal excursions and percussion. Breasts: Symmetric, without lumps, nipple discharge, retractions, or fibrocystic changes.  Abdomen: Flat, soft, with bowl sounds. Nontender, no guarding, rebound, hernias, masses, or organomegaly.  Lymphatics: Non tender without lymphadenopathy.  Musculoskeletal: Full ROM all peripheral extremities, joint stability, 5/5 strength, and normal gait. Skin: Warm and dry without rashes, lesions, cyanosis, clubbing or  ecchymosis.  Neuro: Cranial nerves intact, reflexes equal bilaterally. Normal muscle tone, no cerebellar symptoms. Sensation intact.  Pysch: Alert and oriented X 3, normal affect, Insight and Judgment appropriate.   Assessment and Plan  1. Essential hypertension  - EKG 12-Lead - [place], RETROPERITNL ABD,  LTD - TSH  2. Hyperlipemia  - Lipid panel - TSH  3. Type 2 diabetes mellitus with stage 2 chronic kidney disease, without long-term current use of insulin (HCC)  - Microalbumin / creatinine urine ratio - [facility] DIABETES FOOT EXAM - LOW EXTREMITY NEUR EXAM DOCUM - Hemoglobin A1c - Insulin, random  4.  Vitamin D deficiency  - VITAMIN D 25 Hydroxy  5. Paroxysmal atrial fibrillation (HCC)   6. Gastroesophageal reflux disease   7. Idiopathic gout  - Uric acid  8. Screening for rectal cancer  - POC Hemoccult Bld/Stl   9. Depression screen   10. Essential (primary) hypertension   11. Medication management  - Urinalysis, Routine w reflex microscopic  - CBC with Differential/Platelet - BASIC METABOLIC PANEL WITH GFR - Hepatic function panel - Magnesium   Continue prudent diet as discussed, weight control, BP monitoring, regular exercise, and medications. Discussed med's effects and SE's. Screening labs and tests as requested with regular follow-up as  recommended. Over 40 minutes of exam, counseling, chart review and high complex critical decision making was performed.

## 2015-09-07 LAB — MICROALBUMIN / CREATININE URINE RATIO
Creatinine, Urine: 126 mg/dL (ref 20–320)
MICROALB UR: 0.4 mg/dL
MICROALB/CREAT RATIO: 3 ug/mg{creat} (ref <30)

## 2015-09-07 LAB — URIC ACID: URIC ACID, SERUM: 4.3 mg/dL (ref 2.4–7.0)

## 2015-09-07 LAB — BASIC METABOLIC PANEL WITH GFR
BUN: 26 mg/dL — ABNORMAL HIGH (ref 7–25)
CALCIUM: 9.2 mg/dL (ref 8.6–10.4)
CO2: 29 mmol/L (ref 20–31)
Chloride: 100 mmol/L (ref 98–110)
Creat: 0.83 mg/dL (ref 0.60–0.93)
GFR, EST AFRICAN AMERICAN: 78 mL/min (ref >=60)
GFR, EST NON AFRICAN AMERICAN: 68 mL/min (ref >=60)
Glucose, Bld: 98 mg/dL (ref 65–99)
POTASSIUM: 3.8 mmol/L (ref 3.5–5.3)
SODIUM: 139 mmol/L (ref 135–146)

## 2015-09-07 LAB — URINALYSIS, ROUTINE W REFLEX MICROSCOPIC
Bilirubin Urine: NEGATIVE
Glucose, UA: NEGATIVE
Hgb urine dipstick: NEGATIVE
KETONES UR: NEGATIVE
Leukocytes, UA: NEGATIVE
NITRITE: NEGATIVE
PH: 7 (ref 5.0–8.0)
Protein, ur: NEGATIVE
SPECIFIC GRAVITY, URINE: 1.022 (ref 1.001–1.035)

## 2015-09-07 LAB — LIPID PANEL
CHOL/HDL RATIO: 3.3 ratio (ref <=5.0)
CHOLESTEROL: 164 mg/dL (ref 125–200)
HDL: 49 mg/dL (ref >=46)
LDL CALC: 85 mg/dL (ref <130)
TRIGLYCERIDES: 150 mg/dL — AB (ref <150)
VLDL: 30 mg/dL (ref <30)

## 2015-09-07 LAB — HEMOGLOBIN A1C
Hgb A1c MFr Bld: 6.5 % — ABNORMAL HIGH (ref <5.7)
Mean Plasma Glucose: 140 mg/dL — ABNORMAL HIGH (ref <117)

## 2015-09-07 LAB — HEPATIC FUNCTION PANEL
ALK PHOS: 42 U/L (ref 33–130)
ALT: 16 U/L (ref 6–29)
AST: 18 U/L (ref 10–35)
Albumin: 4 g/dL (ref 3.6–5.1)
BILIRUBIN DIRECT: 0.1 mg/dL (ref <=0.2)
BILIRUBIN INDIRECT: 0.4 mg/dL (ref 0.2–1.2)
Total Bilirubin: 0.5 mg/dL (ref 0.2–1.2)
Total Protein: 6.6 g/dL (ref 6.1–8.1)

## 2015-09-07 LAB — VITAMIN D 25 HYDROXY (VIT D DEFICIENCY, FRACTURES): Vit D, 25-Hydroxy: 40 ng/mL (ref 30–100)

## 2015-09-07 LAB — INSULIN, RANDOM: Insulin: 10.8 u[IU]/mL (ref 2.0–19.6)

## 2015-09-07 LAB — TSH: TSH: 1.44 mIU/L

## 2015-09-07 LAB — MAGNESIUM: Magnesium: 1.8 mg/dL (ref 1.5–2.5)

## 2015-09-20 ENCOUNTER — Other Ambulatory Visit: Payer: Self-pay | Admitting: *Deleted

## 2015-09-20 MED ORDER — CITALOPRAM HYDROBROMIDE 40 MG PO TABS: 40.0000 mg | ORAL_TABLET | Freq: Every day | ORAL | Status: DC

## 2015-09-21 DIAGNOSIS — I10 Essential (primary) hypertension: Secondary | ICD-10-CM | POA: Diagnosis not present

## 2015-09-21 DIAGNOSIS — E7800 Pure hypercholesterolemia, unspecified: Secondary | ICD-10-CM | POA: Diagnosis not present

## 2015-09-21 DIAGNOSIS — E1165 Type 2 diabetes mellitus with hyperglycemia: Secondary | ICD-10-CM | POA: Diagnosis not present

## 2015-09-26 ENCOUNTER — Other Ambulatory Visit: Payer: Self-pay | Admitting: *Deleted

## 2015-09-26 DIAGNOSIS — Z1212 Encounter for screening for malignant neoplasm of rectum: Secondary | ICD-10-CM

## 2015-09-26 LAB — POC HEMOCCULT BLD/STL (HOME/3-CARD/SCREEN)
Card #2 Fecal Occult Blod, POC: NEGATIVE
FECAL OCCULT BLD: NEGATIVE
Fecal Occult Blood, POC: NEGATIVE

## 2015-12-02 DIAGNOSIS — M25562 Pain in left knee: Secondary | ICD-10-CM | POA: Diagnosis not present

## 2015-12-14 ENCOUNTER — Ambulatory Visit (INDEPENDENT_AMBULATORY_CARE_PROVIDER_SITE_OTHER): Payer: Medicare Other | Admitting: Internal Medicine

## 2015-12-14 VITALS — BP 134/66 | HR 54 | Temp 98.2°F | Resp 16 | Ht 65.0 in | Wt 160.0 lb

## 2015-12-14 DIAGNOSIS — E1122 Type 2 diabetes mellitus with diabetic chronic kidney disease: Secondary | ICD-10-CM

## 2015-12-14 DIAGNOSIS — Z79899 Other long term (current) drug therapy: Secondary | ICD-10-CM | POA: Diagnosis not present

## 2015-12-14 DIAGNOSIS — E559 Vitamin D deficiency, unspecified: Secondary | ICD-10-CM | POA: Diagnosis not present

## 2015-12-14 DIAGNOSIS — E1129 Type 2 diabetes mellitus with other diabetic kidney complication: Secondary | ICD-10-CM | POA: Diagnosis not present

## 2015-12-14 DIAGNOSIS — R6889 Other general symptoms and signs: Secondary | ICD-10-CM

## 2015-12-14 DIAGNOSIS — I480 Paroxysmal atrial fibrillation: Secondary | ICD-10-CM

## 2015-12-14 DIAGNOSIS — Z0001 Encounter for general adult medical examination with abnormal findings: Secondary | ICD-10-CM

## 2015-12-14 DIAGNOSIS — E785 Hyperlipidemia, unspecified: Secondary | ICD-10-CM | POA: Diagnosis not present

## 2015-12-14 DIAGNOSIS — M1000 Idiopathic gout, unspecified site: Secondary | ICD-10-CM

## 2015-12-14 DIAGNOSIS — K219 Gastro-esophageal reflux disease without esophagitis: Secondary | ICD-10-CM | POA: Diagnosis not present

## 2015-12-14 DIAGNOSIS — I10 Essential (primary) hypertension: Secondary | ICD-10-CM | POA: Diagnosis not present

## 2015-12-14 DIAGNOSIS — Z0000 Encounter for general adult medical examination without abnormal findings: Secondary | ICD-10-CM

## 2015-12-14 DIAGNOSIS — N182 Chronic kidney disease, stage 2 (mild): Secondary | ICD-10-CM | POA: Diagnosis not present

## 2015-12-14 LAB — LIPID PANEL
Cholesterol: 160 mg/dL (ref 125–200)
HDL: 58 mg/dL (ref >=46)
LDL Cholesterol: 77 mg/dL (ref <130)
Total CHOL/HDL Ratio: 2.8 Ratio (ref <=5.0)
Triglycerides: 126 mg/dL (ref <150)
VLDL: 25 mg/dL (ref <30)

## 2015-12-14 LAB — BASIC METABOLIC PANEL WITH GFR
BUN: 27 mg/dL — AB (ref 7–25)
CHLORIDE: 102 mmol/L (ref 98–110)
CO2: 29 mmol/L (ref 20–31)
Calcium: 9.1 mg/dL (ref 8.6–10.4)
Creat: 0.94 mg/dL — ABNORMAL HIGH (ref 0.60–0.93)
GFR, EST NON AFRICAN AMERICAN: 58 mL/min — AB (ref >=60)
GFR, Est African American: 67 mL/min (ref >=60)
GLUCOSE: 98 mg/dL (ref 65–99)
POTASSIUM: 3.8 mmol/L (ref 3.5–5.3)
Sodium: 140 mmol/L (ref 135–146)

## 2015-12-14 LAB — CBC WITH DIFFERENTIAL/PLATELET
BASOS PCT: 1 %
Basophils Absolute: 85 cells/uL (ref 0–200)
EOS ABS: 170 {cells}/uL (ref 15–500)
Eosinophils Relative: 2 %
HEMATOCRIT: 41.6 % (ref 35.0–45.0)
HEMOGLOBIN: 13.9 g/dL (ref 11.7–15.5)
LYMPHS ABS: 3060 {cells}/uL (ref 850–3900)
Lymphocytes Relative: 36 %
MCH: 32.7 pg (ref 27.0–33.0)
MCHC: 33.4 g/dL (ref 32.0–36.0)
MCV: 97.9 fL (ref 80.0–100.0)
MONO ABS: 425 {cells}/uL (ref 200–950)
MPV: 10.1 fL (ref 7.5–12.5)
Monocytes Relative: 5 %
NEUTROS ABS: 4760 {cells}/uL (ref 1500–7800)
Neutrophils Relative %: 56 %
Platelets: 171 10*3/uL (ref 140–400)
RBC: 4.25 MIL/uL (ref 3.80–5.10)
RDW: 14.3 % (ref 11.0–15.0)
WBC: 8.5 10*3/uL (ref 3.8–10.8)

## 2015-12-14 LAB — TSH: TSH: 1.13 mIU/L

## 2015-12-14 LAB — HEPATIC FUNCTION PANEL
ALBUMIN: 4 g/dL (ref 3.6–5.1)
ALK PHOS: 41 U/L (ref 33–130)
ALT: 16 U/L (ref 6–29)
AST: 19 U/L (ref 10–35)
BILIRUBIN INDIRECT: 0.5 mg/dL (ref 0.2–1.2)
Bilirubin, Direct: 0.1 mg/dL (ref <=0.2)
TOTAL PROTEIN: 6.4 g/dL (ref 6.1–8.1)
Total Bilirubin: 0.6 mg/dL (ref 0.2–1.2)

## 2015-12-14 NOTE — Progress Notes (Addendum)
3 MONTH VISIT AND FOLLOW UP  Assessment:    1. Essential hypertension -well controlled currently -some bradycardia that is asymptomatic -patient to call if worsening dizziness, hypotension or CP - TSH  2. Paroxysmal atrial fibrillation ([place]) -followed by cards  3. Gastroesophageal reflux disease, esophagitis presence not specified -cont meds prn  4. Type 2 diabetes mellitus with stage 2 chronic kidney disease, without long-term current use of insulin (HCC) -cont diet and exercise -send results to Dr. Chalmers Cater - Hemoglobin A1c  5. Hyperlipemia -cont meds - Lipid panel  6. Idiopathic gout, unspecified chronicity, unspecified site -not on medications currently  7. Vitamin D deficiency -cont supplement  8. Medication management  - CBC with Differential/Platelet - BASIC METABOLIC PANEL WITH GFR - Hepatic function panel  9.  Medicare wellness -due next year  Over 30 minutes of exam, counseling, chart review, and critical decision making was performed  Plan:   During the course of the visit the patient was educated and counseled about appropriate screening and preventive services including:    Pneumococcal vaccine   Influenza vaccine  Td vaccine  Prevnar 13  Screening electrocardiogram  Screening mammography  Bone densitometry screening  Colorectal cancer screening  Diabetes screening  Glaucoma screening  Nutrition counseling   Advanced directives: given info/requested copies  Conditions/risks identified: Diabetes is at goal, ACE/ARB therapy: Yes. Urinary Incontinence is not an issue: discussed non pharmacology and pharmacology options.  Fall risk: low- discussed PT, home fall assessment, medications.    Subjective:   Ruth Delgado is a 81 y.o. female who presents for Medicare Annual Wellness Visit and 3 month follow up on hypertension, prediabetes, hyperlipidemia, vitamin D def.  Date of last medicare wellness visit is .   Her blood  pressure has been controlled at home, today their BP is BP: 134/66 mmHg She does workout. She denies chest pain, shortness of breath, dizziness.   She is on cholesterol medication and denies myalgias. Her cholesterol is at goal. The cholesterol last visit was:   Lab Results  Component Value Date   CHOL 164 09/06/2015   HDL 49 09/06/2015   LDLCALC 85 09/06/2015   TRIG 150* 09/06/2015   CHOLHDL 3.3 09/06/2015   She has been working on diet and exercise for diabetes, and denies foot ulcerations, hyperglycemia, hypoglycemia , increased appetite, nausea, paresthesia of the feet, polydipsia, polyuria, visual disturbances, vomiting and weight loss. Last A1C in the office was:  Lab Results  Component Value Date   HGBA1C 6.5* 09/06/2015  Blood sugar has been running 120-125 in the mornings.  She is followed by Dr. Chalmers Cater.    Last GFR Lab Results  Component Value Date   [facility] 68 09/06/2015   Lab Results  Component Value Date   GFRAA 78 09/06/2015   Patient is on Vitamin D supplement. Lab Results  Component Value Date   VD25OH 40 09/06/2015      Medication Review Current Outpatient Prescriptions on File Prior to Visit  Medication Sig Dispense Refill  . allopurinol (ZYLOPRIM) 300 MG tablet TAKE ONE TABLET BY MOUTH ONCE DAILY TO  PREVENT  GOUT 90 tablet 1  . aspirin EC 81 MG tablet Take 81 mg by mouth daily.    Marland Kitchen atenolol (TENORMIN) 50 MG tablet Take 1 tablet (50 mg total) by mouth daily. 90 tablet 1  . B Complex-C (SUPER B COMPLEX PO) Take by mouth daily.    . cholecalciferol (VITAMIN D) 1000 UNITS tablet Take 5,000 Units by mouth every other  day.     . citalopram (CELEXA) 40 MG tablet Take 1 tablet (40 mg total) by mouth daily. 90 tablet 1  . diltiazem (CARTIA XT) 240 MG 24 hr capsule TAKE ONE CAPSULE BY MOUTH ONCE DAILY FOR BLOOD PRESSURE 90 capsule 1  . fish oil-omega-3 fatty acids 1000 MG capsule Take 1 g by mouth daily.    . indapamide (LOZOL) 1.25 MG tablet TAKE ONE TABLET BY  MOUTH TWICE DAILY FOR BLOOD PRESSURE AND FLUID 180 tablet 1  . lisinopril (PRINIVIL,ZESTRIL) 20 MG tablet TAKE ONE TABLET BY MOUTH ONCE DAILY FOR BLOOD PRESSURE 90 tablet 1  . pravastatin (PRAVACHOL) 40 MG tablet Take 1 tablet (40 mg total) by mouth daily. 90 tablet 1  . ranitidine (ZANTAC) 300 MG tablet TAKE ONE TABLET BY MOUTH ONCE DAILY AS NEEDED FOR  ACID  INDIGESTION 90 tablet 1   No current facility-administered medications on file prior to visit.    Current Problems (verified) Patient Active Problem List   Diagnosis Date Noted  . Esophageal reflux 09/06/2015  . Atrial fibrillation ([place]) 03/17/2014  . Vitamin D deficiency 06/08/2013  . Medication management 06/08/2013  . Hypertension   . Hyperlipemia   . Gout   . T2_NIDDM w/CKD2 (GFR 67 ml/min)      Screening Tests Immunization History  Administered Date(s) Administered  . Influenza Split 07/13/2014  . Influenza, High Dose Seasonal PF 04/14/2015  . Pneumococcal-Unspecified 06/07/2003  . Td 06/02/2007  . Zoster 05/30/2012    Preventative care: Last colonoscopy: refused, but may be interested in cologuard Last mammogram: 2016 DEXA:2016  Prior vaccinations: TD or Tdap: 2013  Influenza: 2016  Pneumococcal: 2008 Prevnar13: Today Shingles/Zostavax: 2013  Names of Other Physician/Practitioners you currently use: 1. [facility]- here for primary care 2. Dr. Einar Gip, eye doctor, last visit 2016 3. Dr. Donn Pierini, dentist, last visit 2016 Patient Care Team: Unk Pinto, MD as PCP - General (Internal Medicine) Jacelyn Pi, MD as Consulting Physician (Endocrinology)  Past Surgical History  Procedure Laterality Date  . Knee arthroscopy    . Abdominal hysterectomy    . Biopsy breast      (L) BREAST IN 1993   Family History  Problem Relation Age of Onset  . Diabetes Mother   . Hypertension Father   . CVA Father    Social History  Substance Use Topics  . Smoking  status: Former Smoker    Quit date: 07/30/2006  . Smokeless tobacco: Not on file  . Alcohol Use: 1.0 oz/week    2 drink(s) per week   Allergies  Allergen Reactions  . Buspar [Buspirone]     dysphoria  . Lipitor [Atorvastatin]     Myalgias  . Loop Diuretics     Fatigue/weakness  . Metformin And Related     Gas/bloating  . Nsaids     GI upset  . Sulfa Antibiotics Other (See Comments)    Reaction=throat itching  . Sulfonamide Derivatives     MEDICARE WELLNESS OBJECTIVES: Tobacco use: She does not smoke.  Patient is a former smoker. If yes, counseling given Alcohol Current alcohol use: none Diet: well balanced Physical activity: Current Exercise Habits: Home exercise routine, Type of exercise: walking, Time (Minutes): 30, Frequency (Times/Week): 7, Weekly Exercise (Minutes/Week): 210, Intensity: Mild Cardiac risk factors: Cardiac Risk Factors include: advanced age (>65men, >47 women);diabetes mellitus;dyslipidemia;family history of premature cardiovascular disease;hypertension Depression/mood screen:   Depression screen [facility] 2/9 12/14/2015  Decreased Interest 0  Down, Depressed, Hopeless 0  PHQ - 2 Score 0    ADLs:  In your present state of health, do you have any difficulty performing the following activities: 12/14/2015 09/06/2015  Hearing? N N  Vision? N N  Difficulty concentrating or making decisions? N N  Walking or climbing stairs? N N  Dressing or bathing? N N  Doing errands, shopping? N N  Preparing Food and eating ? N -  Using the Toilet? N -  In the past six months, have you accidently leaked urine? N -  Do you have problems with loss of bowel control? N -  Managing your Medications? N -  Managing your Finances? N -  Housekeeping or managing your Housekeeping? N -     Cognitive Testing  Alert? Yes  Normal Appearance?Yes  Oriented to person? Yes  Place? Yes   Time? Yes  Recall of three objects?  Yes  Can perform simple calculations? Yes  Displays appropriate  judgment?Yes  Can read the correct time from a watch face?Yes  EOL planning: Does patient have an advance directive?: Yes Type of Advance Directive: Healthcare Power of Attorney, Living will Does patient want to make changes to advanced directive?: No - Patient declined Copy of advanced directive(s) in chart?: No - copy requested   Objective:   Today's Vitals   12/14/15 1040  BP: 134/66  Pulse: 54  Temp: 98.2 F (36.8 C)  TempSrc: Temporal  Resp: 16  Height: 5\' 5"  (1.651 m)  Weight: 160 lb (72.576 kg)   Body mass index is 26.63 kg/(m^2).  General appearance: alert, no distress, WD/WN,  female HEENT: normocephalic, sclerae anicteric, TMs pearly, nares patent, no discharge or erythema, pharynx normal Oral cavity: MMM, no lesions Neck: supple, no lymphadenopathy, no thyromegaly, no masses Heart: RRR, normal S1, S2, no murmurs Lungs: CTA bilaterally, no wheezes, rhonchi, or rales Abdomen: +bs, soft, non tender, non distended, no masses, no hepatomegaly, no splenomegaly Musculoskeletal: nontender, no swelling, no obvious deformity Extremities: no edema, no cyanosis, no clubbing Pulses: 2+ symmetric, upper and lower extremities, normal cap refill Neurological: alert, oriented x 3, CN2-12 intact, strength normal upper extremities and lower extremities, sensation normal throughout, DTRs 2+ throughout, no cerebellar signs, gait normal Psychiatric: normal affect, behavior normal, pleasant  Breast: defer Gyn: defer Rectal: defer   Medicare Attestation I have personally reviewed: The patient's medical and social history Their use of alcohol, tobacco or illicit drugs Their current medications and supplements The patient's functional ability including ADLs,fall risks, home safety risks, cognitive, and hearing and visual impairment Diet and physical activities Evidence for depression or mood disorders  The patient's weight, height, BMI, and visual acuity have been recorded in the  chart.  I have made referrals, counseling, and provided education to the patient based on review of the above and I have provided the patient with a written personalized care plan for preventive services.     [name], PA-C   12/14/2015

## 2015-12-15 LAB — HEMOGLOBIN A1C
HEMOGLOBIN A1C: 6.8 % — AB (ref <5.7)
MEAN PLASMA GLUCOSE: 148 mg/dL

## 2016-03-21 ENCOUNTER — Encounter: Payer: Self-pay | Admitting: Internal Medicine

## 2016-03-21 ENCOUNTER — Ambulatory Visit (INDEPENDENT_AMBULATORY_CARE_PROVIDER_SITE_OTHER): Payer: Medicare Other | Admitting: Internal Medicine

## 2016-03-21 VITALS — BP 118/66 | HR 60 | Temp 97.0°F | Resp 14 | Ht 65.0 in | Wt 163.4 lb

## 2016-03-21 DIAGNOSIS — E785 Hyperlipidemia, unspecified: Secondary | ICD-10-CM

## 2016-03-21 DIAGNOSIS — Z79899 Other long term (current) drug therapy: Secondary | ICD-10-CM

## 2016-03-21 DIAGNOSIS — K219 Gastro-esophageal reflux disease without esophagitis: Secondary | ICD-10-CM | POA: Diagnosis not present

## 2016-03-21 DIAGNOSIS — I10 Essential (primary) hypertension: Secondary | ICD-10-CM

## 2016-03-21 DIAGNOSIS — Z1231 Encounter for screening mammogram for malignant neoplasm of breast: Secondary | ICD-10-CM | POA: Diagnosis not present

## 2016-03-21 DIAGNOSIS — E559 Vitamin D deficiency, unspecified: Secondary | ICD-10-CM | POA: Diagnosis not present

## 2016-03-21 DIAGNOSIS — E1122 Type 2 diabetes mellitus with diabetic chronic kidney disease: Secondary | ICD-10-CM

## 2016-03-21 DIAGNOSIS — N182 Chronic kidney disease, stage 2 (mild): Secondary | ICD-10-CM

## 2016-03-21 DIAGNOSIS — M1000 Idiopathic gout, unspecified site: Secondary | ICD-10-CM | POA: Diagnosis not present

## 2016-03-21 LAB — CBC WITH DIFFERENTIAL/PLATELET
Basophils Absolute: 75 {cells}/uL (ref 0–200)
Basophils Relative: 1 %
Eosinophils Absolute: 150 {cells}/uL (ref 15–500)
Eosinophils Relative: 2 %
HCT: 42.7 % (ref 35.0–45.0)
Hemoglobin: 14.4 g/dL (ref 11.7–15.5)
Lymphocytes Relative: 37 %
Lymphs Abs: 2775 {cells}/uL (ref 850–3900)
MCH: 33.1 pg — ABNORMAL HIGH (ref 27.0–33.0)
MCHC: 33.7 g/dL (ref 32.0–36.0)
MCV: 98.2 fL (ref 80.0–100.0)
MPV: 11 fL (ref 7.5–12.5)
Monocytes Absolute: 375 {cells}/uL (ref 200–950)
Monocytes Relative: 5 %
Neutro Abs: 4125 {cells}/uL (ref 1500–7800)
Neutrophils Relative %: 55 %
Platelets: 187 K/uL (ref 140–400)
RBC: 4.35 MIL/uL (ref 3.80–5.10)
RDW: 13.9 % (ref 11.0–15.0)
WBC: 7.5 K/uL (ref 3.8–10.8)

## 2016-03-21 LAB — HEPATIC FUNCTION PANEL
ALT: 16 U/L (ref 6–29)
AST: 19 U/L (ref 10–35)
Albumin: 4.1 g/dL (ref 3.6–5.1)
Alkaline Phosphatase: 39 U/L (ref 33–130)
Bilirubin, Direct: 0.1 mg/dL (ref <=0.2)
Indirect Bilirubin: 0.5 mg/dL (ref 0.2–1.2)
Total Bilirubin: 0.6 mg/dL (ref 0.2–1.2)
Total Protein: 6.6 g/dL (ref 6.1–8.1)

## 2016-03-21 LAB — BASIC METABOLIC PANEL WITHOUT GFR
BUN: 20 mg/dL (ref 7–25)
CO2: 28 mmol/L (ref 20–31)
Calcium: 9.7 mg/dL (ref 8.6–10.4)
Chloride: 101 mmol/L (ref 98–110)
Creat: 0.93 mg/dL (ref 0.60–0.93)
GFR, Est African American: 68 mL/min (ref >=60)
GFR, Est Non African American: 59 mL/min — ABNORMAL LOW (ref >=60)
Glucose, Bld: 100 mg/dL — ABNORMAL HIGH (ref 65–99)
Potassium: 3.7 mmol/L (ref 3.5–5.3)
Sodium: 139 mmol/L (ref 135–146)

## 2016-03-21 LAB — TSH: TSH: 1.27 m[IU]/L

## 2016-03-21 LAB — LIPID PANEL
Cholesterol: 166 mg/dL (ref 125–200)
HDL: 55 mg/dL (ref >=46)
LDL CALC: 76 mg/dL (ref <130)
Total CHOL/HDL Ratio: 3 Ratio (ref <=5.0)
Triglycerides: 174 mg/dL — ABNORMAL HIGH (ref <150)
VLDL: 35 mg/dL — ABNORMAL HIGH (ref <30)

## 2016-03-21 LAB — URIC ACID: Uric Acid, Serum: 4.1 mg/dL (ref 2.5–7.0)

## 2016-03-21 LAB — MAGNESIUM: MAGNESIUM: 1.9 mg/dL (ref 1.5–2.5)

## 2016-03-21 NOTE — Patient Instructions (Addendum)
Recommend Adult Low Dose Aspirin or  coated  Aspirin 81 mg daily  To reduce risk of Colon Cancer 20 %,  Skin Cancer 26 % ,  Melanoma 46%  and  Pancreatic cancer 60% +++++++++++++++++++++++++ Vitamin D goal  is between 70-100.  Please make sure that you are taking your Vitamin D as directed.  It is very important as a natural anti-inflammatory  helping hair, skin, and nails, as well as reducing stroke and heart attack risk.  It helps your bones and helps with mood. It also decreases numerous cancer risks so please take it as directed.  Low Vit D is associated with a 200-300% higher risk for CANCER  and 200-300% higher risk for HEART   ATTACK  &  STROKE.   ...................................... It is also associated with higher death rate at younger ages,  autoimmune diseases like Rheumatoid arthritis, Lupus, Multiple Sclerosis.    Also many other serious conditions, like depression, Alzheimer's Dementia, infertility, muscle aches, fatigue, fibromyalgia - just to name a few. ++++++++++++++++++++ Recommend the book "The END of DIETING" by Dr Joel Fuhrman  & the book "The END of DIABETES " by Dr Joel Fuhrman At [employer] - get book & Audio CD's    Being diabetic has a  300% increased risk for heart attack, stroke, cancer, and alzheimer- type vascular dementia. It is very important that you work harder with diet by avoiding all foods that are white. Avoid white rice (brown & wild rice is OK), white potatoes (sweetpotatoes in moderation is OK), White bread or wheat bread or anything made out of white flour like bagels, donuts, rolls, buns, biscuits, cakes, pastries, cookies, pizza crust, and pasta (made from white flour & egg whites) - vegetarian pasta or spinach or wheat pasta is OK. Multigrain breads like Arnold's or Pepperidge Farm, or multigrain sandwich thins or flatbreads.  Diet, exercise and weight loss can reverse and cure diabetes in the early stages.  Diet, exercise and weight loss is  very important in the control and prevention of complications of diabetes which affects every system in your body, ie. Brain - dementia/stroke, eyes - glaucoma/blindness, heart - heart attack/heart failure, kidneys - dialysis, stomach - gastric paralysis, intestines - malabsorption, nerves - severe painful neuritis, circulation - gangrene & loss of a leg(s), and finally cancer and Alzheimers.    I recommend avoid fried & greasy foods,  sweets/candy, white rice (brown or wild rice or Quinoa is OK), white potatoes (sweet potatoes are OK) - anything made from white flour - bagels, doughnuts, rolls, buns, biscuits,white and wheat breads, pizza crust and traditional pasta made of white flour & egg white(vegetarian pasta or spinach or wheat pasta is OK).  Multi-grain bread is OK - like multi-grain flat bread or sandwich thins. Avoid alcohol in excess. Exercise is also important.    Eat all the vegetables you want - avoid meat, especially red meat and dairy - especially cheese.  Cheese is the most concentrated form of trans-fats which is the worst thing to clog up our arteries. Veggie cheese is OK which can be found in the fresh produce section at [employer] or [employer] or [employer]  +++++++++++++++++++++ DASH Eating Plan  DASH stands for "Dietary Approaches to Stop Hypertension."   The DASH eating plan is a healthy eating plan that has been shown to reduce high blood pressure (hypertension). Additional health benefits may include reducing the risk of type 2 diabetes mellitus, heart disease, and stroke. The DASH eating plan may also   help with weight loss. WHAT DO I NEED TO KNOW ABOUT THE DASH EATING PLAN? For the DASH eating plan, you will follow these general guidelines:  Choose foods with a percent daily value for sodium of less than 5% (as listed on the food label).  Use salt-free seasonings or herbs instead of table salt or sea salt.  Check with your health care provider or pharmacist before  using salt substitutes.  Eat lower-sodium products, often labeled as "lower sodium" or "no salt added."  Eat fresh foods.  Eat more vegetables, fruits, and low-fat dairy products.  Choose whole grains. Look for the word "whole" as the first word in the ingredient list.  Choose fish   Limit sweets, desserts, sugars, and sugary drinks.  Choose heart-healthy fats.  Eat veggie cheese   Eat more home-cooked food and less restaurant, buffet, and fast food.  Limit fried foods.  [place]el foods using methods other than frying.  Limit canned vegetables. If you do use them, rinse them well to decrease the sodium.  When eating at a restaurant, ask that your food be prepared with less salt, or no salt if possible.                      WHAT FOODS CAN I EAT? Read Dr Joel Fuhrman's books on The End of Dieting & The End of Diabetes  Grains Whole grain or whole wheat bread. Brown rice. Whole grain or whole wheat pasta. Quinoa, bulgur, and whole grain cereals. Low-sodium cereals. Corn or whole wheat flour tortillas. Whole grain cornbread. Whole grain crackers. Low-sodium crackers.  Vegetables Fresh or frozen vegetables (raw, steamed, roasted, or grilled). Low-sodium or reduced-sodium tomato and vegetable juices. Low-sodium or reduced-sodium tomato sauce and paste. Low-sodium or reduced-sodium canned vegetables.   Fruits All fresh, canned (in natural juice), or frozen fruits.  Protein Products  All fish and seafood.  Dried beans, peas, or lentils. Unsalted nuts and seeds. Unsalted canned beans.  Dairy Low-fat dairy products, such as skim or 1% milk, 2% or reduced-fat cheeses, low-fat ricotta or cottage cheese, or plain low-fat yogurt. Low-sodium or reduced-sodium cheeses.  Fats and Oils Tub margarines without trans fats. Light or reduced-fat mayonnaise and salad dressings (reduced sodium). Avocado. Safflower, olive, or canola oils. Natural peanut or almond butter.  Other Unsalted popcorn  and pretzels. The items listed above may not be a complete list of recommended foods or beverages. Contact your dietitian for more options.  +++++++++++++++  WHAT FOODS ARE NOT RECOMMENDED? Grains/ White flour or wheat flour White bread. White pasta. White rice. Refined cornbread. Bagels and croissants. Crackers that contain trans fat.  Vegetables  Creamed or fried vegetables. Vegetables in a . Regular canned vegetables. Regular canned tomato sauce and paste. Regular tomato and vegetable juices.  Fruits Dried fruits. Canned fruit in light or heavy syrup. Fruit juice.  Meat and Other Protein Products Meat in general - RED meat & White meat.  Fatty cuts of meat. Ribs, chicken wings, all processed meats as bacon, sausage, bologna, salami, fatback, hot dogs, bratwurst and packaged luncheon meats.  Dairy Whole or 2% milk, cream, half-and-half, and cream cheese. Whole-fat or sweetened yogurt. Full-fat cheeses or blue cheese. Non-dairy creamers and whipped toppings. Processed cheese, cheese spreads, or cheese curds.  Condiments Onion and garlic salt, seasoned salt, table salt, and sea salt. Canned and packaged gravies. Worcestershire sauce. Tartar sauce. Barbecue sauce. Teriyaki sauce. Soy sauce, including reduced sodium. Steak sauce. Fish sauce. Oyster sauce. Cocktail   sauce. Horseradish. Ketchup and mustard. Meat flavorings and tenderizers. Bouillon cubes. Hot sauce. Tabasco sauce. Marinades. Taco seasonings. Relishes.  Fats and Oils Butter, stick margarine, lard, shortening and bacon fat. Coconut, palm kernel, or palm oils. Regular salad dressings.  Pickles and olives. Salted popcorn and pretzels.  The items listed above may not be a complete list of foods and beverages to avoid.   

## 2016-03-21 NOTE — Progress Notes (Signed)
[name]                       Ruth Delgado, M.D.        [name]. [name], P.A.-C       [name], P.A.-C   [facility]                [address] [name], [place] [id number] Telephone [phone] Telefax [phone] ______________________________________________________________________     This very nice 81y.o.WWF presents for 6 month follow up with Hypertension, Hyperlipidemia, Pre-Diabetes and Vitamin D Deficiency. Patient also has hx/o Gout which has been Asx.  Also patient has hx/o GERD controlled with prudent diet & Ranitidine.      Patient is treated for HTN circa 1991 & BP has been controlled at home. Today's BP is 118/66. Patient has had no complaints of any cardiac type chest pain, palpitations, dyspnea/orthopnea/PND, dizziness, claudication, or dependent edema.     Hyperlipidemia is controlled with diet & meds. Patient denies myalgias or other med SE's. Last Lipids were at goal: Lab Results  Component Value Date   CHOL 160 12/14/2015   HDL 58 12/14/2015   LDLCALC 77 12/14/2015   TRIG 126 12/14/2015   CHOLHDL 2.8 12/14/2015      Also, the patient has history of T2_NIDDM  Circa 2007 and reports CBG's range less than 140 mg%. She has had no symptoms of reactive hypoglycemia, diabetic polys, paresthesias or visual blurring.  Last A1c was not at goal: Lab Results  Component Value Date   HGBA1C 6.8 (H) 12/14/2015      Further, the patient also has history of Vitamin D Deficiency  Of "29" in 2008 and supplements vitamin D without any suspected side-effects. Last vitamin D was still relatively low : Lab Results  Component Value Date   VD25OH 40 09/06/2015   Current Outpatient Prescriptions on File Prior to Visit  Medication Sig  . allopurinol (ZYLOPRIM) 300 MG tablet TAKE ONE TABLET BY MOUTH ONCE DAILY TO  PREVENT  GOUT  . aspirin EC 81 MG tablet Take 81 mg by mouth daily.  Marland Kitchen  atenolol (TENORMIN) 50 MG tablet Take 1 tablet (50 mg total) by mouth daily.  . B Complex-C (SUPER B COMPLEX PO) Take by mouth daily.  . cholecalciferol (VITAMIN D) 1000 UNITS tablet Take 5,000 Units by mouth every other day.   . citalopram (CELEXA) 40 MG tablet Take 1 tablet (40 mg total) by mouth daily.  Marland Kitchen diltiazem (CARTIA XT) 240 MG 24 hr capsule TAKE ONE CAPSULE BY MOUTH ONCE DAILY FOR BLOOD PRESSURE  . fish oil-omega-3 fatty acids 1000 MG capsule Take 1 g by mouth daily.  . indapamide (LOZOL) 1.25 MG tablet TAKE ONE TABLET BY MOUTH TWICE DAILY FOR BLOOD PRESSURE AND FLUID  . lisinopril (PRINIVIL,ZESTRIL) 20 MG tablet TAKE ONE TABLET BY MOUTH ONCE DAILY FOR BLOOD PRESSURE  . pravastatin (PRAVACHOL) 40 MG tablet Take 1 tablet (40 mg total) by mouth daily.  . ranitidine (ZANTAC) 300 MG tablet TAKE ONE TABLET BY MOUTH ONCE DAILY AS NEEDED FOR  ACID  INDIGESTION   No current facility-administered medications on file prior to visit.    Allergies  Allergen Reactions  . Buspar [Buspirone]     dysphoria  . Lipitor [Atorvastatin]     Myalgias  . Loop Diuretics  Fatigue/weakness  . Metformin And Related     Gas/bloating  . Nsaids     GI upset  . Sulfa Antibiotics Other (See Comments)    Reaction=throat itching  . Sulfonamide Derivatives    PMHx:   Past Medical History:  Diagnosis Date  . A-fib ([place])   . Anxiety   . Arthritis   . Diabetes mellitus   . Gout   . Hyperlipemia   . Hypertension    Immunization History  Administered Date(s) Administered  . Influenza Split 07/13/2014  . Influenza, High Dose Seasonal PF 04/14/2015  . Pneumococcal-Unspecified 06/07/2003  . Td 06/02/2007  . Zoster 05/30/2012   Past Surgical History:  Procedure Laterality Date  . ABDOMINAL HYSTERECTOMY    . BIOPSY BREAST     (L) BREAST IN 1993  . KNEE ARTHROSCOPY     FHx:    Reviewed / unchanged  SHx:    Reviewed / unchanged  Systems Review:  Constitutional: Denies fever, chills, wt  changes, headaches, insomnia, fatigue, night sweats, change in appetite. Eyes: Denies redness, blurred vision, diplopia, discharge, itchy, watery eyes.  ENT: Denies discharge, congestion, post nasal drip, epistaxis, sore throat, earache, hearing loss, dental pain, tinnitus, vertigo, sinus pain, snoring.  CV: Denies chest pain, palpitations, irregular heartbeat, syncope, dyspnea, diaphoresis, orthopnea, PND, claudication or edema. Respiratory: denies cough, dyspnea, DOE, pleurisy, hoarseness, laryngitis, wheezing.  Gastrointestinal: Denies dysphagia, odynophagia, heartburn, reflux, water brash, abdominal pain or cramps, nausea, vomiting, bloating, diarrhea, constipation, hematemesis, melena, hematochezia  or hemorrhoids. Genitourinary: Denies dysuria, frequency, urgency, nocturia, hesitancy, discharge, hematuria or flank pain. Musculoskeletal: Denies arthralgias, myalgias, stiffness, jt. swelling, pain, limping or strain/sprain.  Skin: Denies pruritus, rash, hives, warts, acne, eczema or change in skin lesion(s). Neuro: No weakness, tremor, incoordination, spasms, paresthesia or pain. Psychiatric: Denies confusion, memory loss or sensory loss. Endo: Denies change in weight, skin or hair change.  Heme/Lymph: No excessive bleeding, bruising or enlarged lymph nodes.  Physical Exam BP 118/66   Pulse 60   Temp 97 F (36.1 C)   Resp 14   Ht 5\' 5"  (1.651 m)   Wt 163 lb 6.4 oz (74.1 kg)   SpO2 96%   BMI 27.19 kg/m   Appears well nourished and in no distress.  Eyes: PERRLA, EOMs, conjunctiva no swelling or erythema. Sinuses: No frontal/maxillary tenderness ENT/Mouth: EAC's clear, TM's nl w/o erythema, bulging. Nares clear w/o erythema, swelling, exudates. Oropharynx clear without erythema or exudates. Oral hygiene is good. Tongue normal, non obstructing. Hearing intact.  Neck: Supple. Thyroid nl. Car 2+/2+ without bruits, nodes or JVD. Chest: Respirations nl with BS clear & equal w/o rales,  rhonchi, wheezing or stridor.  Cor: Heart sounds normal w/ regular rate and rhythm without sig. murmurs, gallops, clicks, or rubs. Peripheral pulses normal and equal  without edema.  Abdomen: Soft & bowel sounds normal. Non-tender w/o guarding, rebound, hernias, masses, or organomegaly.  Lymphatics: Unremarkable.  Musculoskeletal: Full ROM all peripheral extremities, joint stability, 5/5 strength, and normal gait.  Skin: Warm, dry without exposed rashes, lesions or ecchymosis apparent.  Neuro: Cranial nerves intact, reflexes equal bilaterally. Sensory-motor testing grossly intact. Tendon reflexes grossly intact.  Pysch: Alert & oriented x 3.  Insight and judgement nl & appropriate. No ideations.  Assessment and Plan:   1. Essential hypertension  - Continue medication, monitor blood pressure at home. Continue DASH diet. Reminder to go to the ER if any CP, SOB, nausea, dizziness, severe HA, changes vision/speech, left arm numbness and tingling and  jaw pain. - TSH  2. Hyperlipemia  - Continue diet/meds, exercise,& lifestyle modifications. Continue monitor periodic cholesterol/liver & renal functions  - Lipid panel - TSH  3. Type 2 diabetes mellitus with stage 2 chronic kidney disease, without long-term current use of insulin (HCC)  - Continue diet, exercise, lifestyle modifications. Monitor appropriate labs. - Hemoglobin A1c - Insulin, random  4. Vitamin D deficiency - Continue supplementation. - VITAMIN D 25 Hydroxy (Vit-D Deficiency, Fractures)  5. Idiopathic gout, unspecified chronicity, unspecified site  - Uric acid  6. Gastroesophageal reflux disease, esophagitis presence not specified   7. Medication management  - CBC with Differential/Platelet - BASIC METABOLIC PANEL WITH GFR - Hepatic function panel - Magnesium   Recommended regular exercise, BP monitoring, weight control, and discussed med and SE's. Recommended labs to assess and monitor clinical status. Further  disposition pending results of labs. Over 30 minutes of exam, counseling, chart review was performed

## 2016-03-22 LAB — INSULIN, RANDOM: INSULIN: 7.4 u[IU]/mL (ref 2.0–19.6)

## 2016-03-22 LAB — HEMOGLOBIN A1C
HEMOGLOBIN A1C: 6.4 % — AB (ref <5.7)
Mean Plasma Glucose: 137 mg/dL

## 2016-03-22 LAB — VITAMIN D 25 HYDROXY (VIT D DEFICIENCY, FRACTURES): VIT D 25 HYDROXY: 46 ng/mL (ref 30–100)

## 2016-03-27 DIAGNOSIS — N6001 Solitary cyst of right breast: Secondary | ICD-10-CM | POA: Diagnosis not present

## 2016-03-29 ENCOUNTER — Other Ambulatory Visit: Payer: Self-pay | Admitting: Internal Medicine

## 2016-04-03 ENCOUNTER — Other Ambulatory Visit: Payer: Self-pay | Admitting: Internal Medicine

## 2016-04-16 DIAGNOSIS — I10 Essential (primary) hypertension: Secondary | ICD-10-CM | POA: Diagnosis not present

## 2016-04-16 DIAGNOSIS — E7800 Pure hypercholesterolemia, unspecified: Secondary | ICD-10-CM | POA: Diagnosis not present

## 2016-04-16 DIAGNOSIS — E1165 Type 2 diabetes mellitus with hyperglycemia: Secondary | ICD-10-CM | POA: Diagnosis not present

## 2016-04-18 DIAGNOSIS — H25093 Other age-related incipient cataract, bilateral: Secondary | ICD-10-CM | POA: Diagnosis not present

## 2016-04-18 DIAGNOSIS — H1045 Other chronic allergic conjunctivitis: Secondary | ICD-10-CM | POA: Diagnosis not present

## 2016-05-01 ENCOUNTER — Other Ambulatory Visit: Payer: Self-pay | Admitting: Internal Medicine

## 2016-05-08 DIAGNOSIS — E119 Type 2 diabetes mellitus without complications: Secondary | ICD-10-CM | POA: Diagnosis not present

## 2016-05-09 ENCOUNTER — Other Ambulatory Visit: Payer: Self-pay | Admitting: Internal Medicine

## 2016-05-21 ENCOUNTER — Ambulatory Visit (INDEPENDENT_AMBULATORY_CARE_PROVIDER_SITE_OTHER): Payer: Medicare Other | Admitting: Physician Assistant

## 2016-05-21 ENCOUNTER — Encounter: Payer: Self-pay | Admitting: Physician Assistant

## 2016-05-21 ENCOUNTER — Other Ambulatory Visit: Payer: Self-pay | Admitting: Internal Medicine

## 2016-05-21 VITALS — BP 118/72 | HR 68 | Temp 97.7°F | Resp 16 | Ht 65.0 in | Wt 168.0 lb

## 2016-05-21 DIAGNOSIS — M25521 Pain in right elbow: Secondary | ICD-10-CM

## 2016-05-21 DIAGNOSIS — E1122 Type 2 diabetes mellitus with diabetic chronic kidney disease: Secondary | ICD-10-CM | POA: Diagnosis not present

## 2016-05-21 DIAGNOSIS — N182 Chronic kidney disease, stage 2 (mild): Secondary | ICD-10-CM | POA: Diagnosis not present

## 2016-05-21 DIAGNOSIS — J029 Acute pharyngitis, unspecified: Secondary | ICD-10-CM | POA: Diagnosis not present

## 2016-05-21 DIAGNOSIS — I480 Paroxysmal atrial fibrillation: Secondary | ICD-10-CM | POA: Diagnosis not present

## 2016-05-21 MED ORDER — AZITHROMYCIN 250 MG PO TABS: ORAL_TABLET | ORAL | 1 refills | Status: AC

## 2016-05-21 NOTE — Patient Instructions (Signed)
Take nexium 2 pills once daily for 7 days If not better after 3-4 days take zpak If any chest pain, shortness of breath go to Er.   Food Choices for Gastroesophageal Reflux Disease, Adult When you have gastroesophageal reflux disease (GERD), the foods you eat and your eating habits are very important. Choosing the right foods can help ease the discomfort of GERD. WHAT GENERAL GUIDELINES DO I NEED TO FOLLOW?  Choose fruits, vegetables, whole grains, low-fat dairy products, and low-fat meat, fish, and poultry.  Limit fats such as oils, salad dressings, butter, nuts, and avocado.  Keep a food diary to identify foods that cause symptoms.  Avoid foods that cause reflux. These may be different for different people.  Eat frequent small meals instead of three large meals each day.  Eat your meals slowly, in a relaxed setting.  Limit fried foods.  [place] foods using methods other than frying.  Avoid drinking alcohol.  Avoid drinking large amounts of liquids with your meals.  Avoid bending over or lying down until 2-3 hours after eating. WHAT FOODS ARE NOT RECOMMENDED? The following are some foods and drinks that may worsen your symptoms: Vegetables Tomatoes. Tomato juice. Tomato and spaghetti sauce. Chili peppers. Onion and garlic. Horseradish. Fruits Oranges, grapefruit, and lemon (fruit and juice). Meats High-fat meats, fish, and poultry. This includes hot dogs, ribs, ham, sausage, salami, and bacon. Dairy Whole milk and chocolate milk. Sour cream. Cream. Butter. Ice cream. Cream cheese.  Beverages Coffee and tea, with or without caffeine. Carbonated beverages or energy drinks. Condiments Hot sauce. Barbecue sauce.  Sweets/Desserts Chocolate and cocoa. Donuts. Peppermint and spearmint. Fats and Oils High-fat foods, including [place] fries and potato chips. Other Vinegar. Strong spices, such as black pepper, white pepper, red pepper, cayenne, curry powder, cloves, ginger, and  chili powder. The items listed above may not be a complete list of foods and beverages to avoid. Contact your dietitian for more information.   This information is not intended to replace advice given to you by your health care provider. Make sure you discuss any questions you have with your health care provider.   Document Released: 07/16/2005 Document Revised: 08/06/2014 Document Reviewed: 05/20/2013 Elsevier Interactive Patient Education [employer].

## 2016-05-21 NOTE — Progress Notes (Signed)
Subjective:    Patient ID: Ruth Delgado, female    DOB: 1935/04/26, 81 y.o.   MRN: [id number]  HPI 81 y.o. WF with history of DM2, chol, HTN, gout, Afib, GERD presents with choking sensation and sore throat this AM. Woke up this AM choking, feels "choking" sensation now too, very sore throat with water, right substernal dull ache worse with deep breath, has had some right arm pain shoulder to elbow pain yesterday nonexertional, took a zantac this AM.  Denies SOB, wheezing, coughing, fever, chills, sinus issues. Did eat [place] yesterday and had chocolate.  She is low risk for DVT/PE, she denies estrogen replacement therapy, history of deep venous thrombosis, history of malignancy, long duration of automobile or plane travel, oral contraceptive use, prolonged stay in bed, recent limb injury or fracture and recent surgery  Blood pressure 118/72, pulse 68, temperature 97.7 F (36.5 C), resp. rate 16, height 5\' 5"  (1.651 m), weight 168 lb (76.2 kg), SpO2 97 %.  Medications Current Outpatient Prescriptions on File Prior to Visit  Medication Sig  . allopurinol (ZYLOPRIM) 300 MG tablet TAKE ONE TABLET BY MOUTH ONCE DAILY TO  PREVENT  GOUT  . aspirin EC 81 MG tablet Take 81 mg by mouth daily.  Marland Kitchen atenolol (TENORMIN) 50 MG tablet TAKE 1 TABLET (50 MG TOTAL) BY MOUTH DAILY.  . B Complex-C (SUPER B COMPLEX PO) Take by mouth daily.  . cholecalciferol (VITAMIN D) 1000 UNITS tablet Take 5,000 Units by mouth every other day.   . citalopram (CELEXA) 40 MG tablet TAKE 1 TABLET (40 MG TOTAL) BY MOUTH DAILY.  Marland Kitchen diltiazem (CARDIZEM CD) 240 MG 24 hr capsule TAKE ONE CAPSULE BY MOUTH ONCE DAILY FOR BLOOD PRESSURE  . fish oil-omega-3 fatty acids 1000 MG capsule Take 1 g by mouth daily.  Marland Kitchen FREESTYLE LITE test strip   . indapamide (LOZOL) 1.25 MG tablet TAKE ONE TABLET BY MOUTH TWICE DAILY FOR BLOOD PRESSURE AND FLUID  . Lancets (FREESTYLE) lancets   . lisinopril (PRINIVIL,ZESTRIL) 20 MG tablet TAKE ONE  TABLET BY MOUTH ONCE DAILY FOR BLOOD PRESSURE.  . pravastatin (PRAVACHOL) 40 MG tablet TAKE 1 TABLET (40 MG TOTAL) BY MOUTH DAILY.  . ranitidine (ZANTAC) 300 MG tablet TAKE ONE TABLET BY MOUTH ONCE DAILY AS NEEDED FOR  ACID  INDIGESTION   No current facility-administered medications on file prior to visit.     Problem list She has Hypertension; Hyperlipemia; Gout; T2_NIDDM w/CKD2 (GFR 67 ml/min) ; Vitamin D deficiency; Medication management; Atrial fibrillation ([place]); and Esophageal reflux on her problem list.  Review of Systems  Constitutional: Negative.   HENT: Negative.  Negative for sore throat.   Eyes: Negative.   Respiratory: Positive for choking and chest tightness. Negative for apnea, cough, shortness of breath and wheezing.   Cardiovascular: Negative.  Negative for chest pain.  Gastrointestinal: Positive for abdominal distention, abdominal pain and nausea. Negative for anal bleeding, blood in stool, constipation, diarrhea, rectal pain and vomiting.  Genitourinary: Negative.   Musculoskeletal: Negative.   Skin: Negative.   Neurological: Negative.   Hematological: Negative.   Psychiatric/Behavioral: Negative.        Objective:   Physical Exam  Constitutional: She is oriented to person, place, and time. She appears well-developed and well-nourished.  HENT:  Head: Normocephalic and atraumatic.  Right Ear: External ear normal.  Left Ear: External ear normal.  Mouth/Throat: Oropharynx is clear and moist.  Eyes: Conjunctivae and EOM are normal. Pupils are equal, round, and  reactive to light.  Neck: Normal range of motion. Neck supple. No thyromegaly present.  Cardiovascular: Normal rate, regular rhythm and normal heart sounds.  Exam reveals no gallop and no friction rub.   No murmur heard. Pulmonary/Chest: Effort normal and breath sounds normal. No respiratory distress. She has no wheezes.  Abdominal: Soft. Bowel sounds are normal. She exhibits no shifting dullness, no  distension, no abdominal bruit, no pulsatile midline mass and no mass. There is no hepatosplenomegaly. There is tenderness in the epigastric area and left lower quadrant. There is no rigidity, no rebound, no guarding, no CVA tenderness, no tenderness at McBurney's point and negative Murphy's sign. No hernia.  Musculoskeletal: Normal range of motion.  Lymphadenopathy:    She has no cervical adenopathy.  Neurological: She is alert and oriented to person, place, and time.  Skin: Skin is warm and dry.  Psychiatric: She has a normal mood and affect.       Assessment & Plan:  1. Paroxysmal atrial fibrillation (HCC) NSR, normal EKG  2. Type 2 diabetes mellitus with stage 2 chronic kidney disease, without long-term current use of insulin (HCC) monitor  3. Sore throat ? GERD versus pharyngitis- try nexium, if not better take zpak

## 2016-06-28 ENCOUNTER — Ambulatory Visit (INDEPENDENT_AMBULATORY_CARE_PROVIDER_SITE_OTHER): Payer: Medicare Other | Admitting: Physician Assistant

## 2016-06-28 ENCOUNTER — Encounter: Payer: Self-pay | Admitting: Physician Assistant

## 2016-06-28 VITALS — BP 120/68 | HR 60 | Temp 97.9°F | Resp 16 | Ht 65.0 in | Wt 168.4 lb

## 2016-06-28 DIAGNOSIS — E559 Vitamin D deficiency, unspecified: Secondary | ICD-10-CM | POA: Diagnosis not present

## 2016-06-28 DIAGNOSIS — I10 Essential (primary) hypertension: Secondary | ICD-10-CM | POA: Diagnosis not present

## 2016-06-28 DIAGNOSIS — I480 Paroxysmal atrial fibrillation: Secondary | ICD-10-CM | POA: Diagnosis not present

## 2016-06-28 DIAGNOSIS — Z79899 Other long term (current) drug therapy: Secondary | ICD-10-CM | POA: Diagnosis not present

## 2016-06-28 DIAGNOSIS — E1122 Type 2 diabetes mellitus with diabetic chronic kidney disease: Secondary | ICD-10-CM | POA: Diagnosis not present

## 2016-06-28 DIAGNOSIS — N182 Chronic kidney disease, stage 2 (mild): Secondary | ICD-10-CM

## 2016-06-28 DIAGNOSIS — E785 Hyperlipidemia, unspecified: Secondary | ICD-10-CM | POA: Diagnosis not present

## 2016-06-28 LAB — LIPID PANEL
Cholesterol: 146 mg/dL (ref <200)
HDL: 49 mg/dL — ABNORMAL LOW (ref >50)
LDL CALC: 76 mg/dL (ref <100)
Total CHOL/HDL Ratio: 3 Ratio (ref <5.0)
Triglycerides: 103 mg/dL (ref <150)
VLDL: 21 mg/dL (ref <30)

## 2016-06-28 LAB — HEPATIC FUNCTION PANEL
ALBUMIN: 3.9 g/dL (ref 3.6–5.1)
ALK PHOS: 38 U/L (ref 33–130)
ALT: 17 U/L (ref 6–29)
AST: 19 U/L (ref 10–35)
BILIRUBIN INDIRECT: 0.5 mg/dL (ref 0.2–1.2)
Bilirubin, Direct: 0.1 mg/dL (ref <=0.2)
TOTAL PROTEIN: 6.2 g/dL (ref 6.1–8.1)
Total Bilirubin: 0.6 mg/dL (ref 0.2–1.2)

## 2016-06-28 LAB — BASIC METABOLIC PANEL WITH GFR
BUN: 22 mg/dL (ref 7–25)
CALCIUM: 9.4 mg/dL (ref 8.6–10.4)
CO2: 30 mmol/L (ref 20–31)
CREATININE: 0.82 mg/dL (ref 0.60–0.93)
Chloride: 102 mmol/L (ref 98–110)
GFR, Est African American: 79 mL/min (ref >=60)
GFR, Est Non African American: 68 mL/min (ref >=60)
Glucose, Bld: 77 mg/dL (ref 65–99)
Potassium: 4.1 mmol/L (ref 3.5–5.3)
Sodium: 141 mmol/L (ref 135–146)

## 2016-06-28 LAB — CBC WITH DIFFERENTIAL/PLATELET
BASOS ABS: 64 {cells}/uL (ref 0–200)
Basophils Relative: 1 %
EOS PCT: 2 %
Eosinophils Absolute: 128 cells/uL (ref 15–500)
HCT: 42.2 % (ref 35.0–45.0)
Hemoglobin: 13.6 g/dL (ref 11.7–15.5)
LYMPHS PCT: 38 %
Lymphs Abs: 2432 cells/uL (ref 850–3900)
MCH: 32.6 pg (ref 27.0–33.0)
MCHC: 32.2 g/dL (ref 32.0–36.0)
MCV: 101.2 fL — AB (ref 80.0–100.0)
MONOS PCT: 7 %
MPV: 10.4 fL (ref 7.5–12.5)
Monocytes Absolute: 448 cells/uL (ref 200–950)
NEUTROS ABS: 3328 {cells}/uL (ref 1500–7800)
NEUTROS PCT: 52 %
PLATELETS: 193 10*3/uL (ref 140–400)
RBC: 4.17 MIL/uL (ref 3.80–5.10)
RDW: 14.3 % (ref 11.0–15.0)
WBC: 6.4 10*3/uL (ref 3.8–10.8)

## 2016-06-28 LAB — MAGNESIUM: MAGNESIUM: 1.7 mg/dL (ref 1.5–2.5)

## 2016-06-28 NOTE — Patient Instructions (Signed)
Please pick one of the over the counter allergy medications below and take it once daily for allergies.  Claritin or loratadine cheapest but likely the weakest  Zyrtec or certizine at night because it can make you sleepy The strongest is allegra or fexafinadine   Claritin would likely be the best for you  Continue the nexium

## 2016-06-28 NOTE — Progress Notes (Signed)
Assessment and Plan:  Hypertension: Continue medication, monitor blood pressure at home. Continue DASH diet.  Reminder to go to the ER if any CP, SOB, nausea, dizziness, severe HA, changes vision/speech, left arm numbness and tingling and jaw pain. Cholesterol: Continue diet and exercise. Check cholesterol.  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 Check A1C Vitamin D Def- check level and continue medications.   Continue diet and meds as discussed. Further disposition pending results of labs. Discussed med's effects and SE's.   Future Appointments Date Time Provider [place]  10/11/2016 3:00 PM Unk Pinto, MD [facility] None     HPI 81 y.o. female  presents for 3 month follow up with hypertension, hyperlipidemia, diabetes and vitamin D.  Her blood pressure has been controlled at home, today their BP is BP: 120/68.  She does workout. She denies chest pain, shortness of breath, dizziness. She a history of pAfib without CAD/valvular disease for 20 years, she is on Cardizem which controls rate and rhythm, she is on a bASA but declines ASA due bleed/fall risk . CHADSVASC2 Score of 5, understands risk but does not want anticoagulation, very rare pAfib  She is on cholesterol medication and denies myalgias. Her cholesterol is not at goal. The cholesterol was: Lab Results  Component Value Date   CHOL 166 03/21/2016   HDL 55 03/21/2016   LDLCALC 76 03/21/2016   TRIG 174 (H) 03/21/2016   CHOLHDL 3.0 03/21/2016   She has been working on diet and exercise for diabetes with diabetic chronic kidney disease and with other circulatory complications, she is on bASA, she is on ACE/ARB, checks sugar 2 x a week, and denies  paresthesia of the feet, polydipsia, polyuria and visual disturbances. Last A1C  Lab Results  Component Value Date   HGBA1C 6.4 (H) 03/21/2016    Patient is on Vitamin D supplement. 03/21/2016: Vit  D, 25-Hydroxy 46 She is on celexa for anxiety without depression.  Patient is on allopurinol for gout and does not report a recent flare.  Lab Results  Component Value Date   LABURIC 4.1 03/21/2016     Current Medications:  Current Outpatient Prescriptions on File Prior to Visit  Medication Sig Dispense Refill  . allopurinol (ZYLOPRIM) 300 MG tablet TAKE ONE TABLET BY MOUTH ONCE DAILY TO PREVENT GOUT 90 tablet 1  . aspirin EC 81 MG tablet Take 81 mg by mouth daily.    Marland Kitchen atenolol (TENORMIN) 50 MG tablet TAKE 1 TABLET (50 MG TOTAL) BY MOUTH DAILY. 90 tablet 1  . B Complex-C (SUPER B COMPLEX PO) Take by mouth daily.    . cholecalciferol (VITAMIN D) 1000 UNITS tablet Take 5,000 Units by mouth every other day.     . citalopram (CELEXA) 40 MG tablet TAKE 1 TABLET (40 MG TOTAL) BY MOUTH DAILY. 90 tablet 1  . diltiazem (CARDIZEM CD) 240 MG 24 hr capsule TAKE ONE CAPSULE BY MOUTH ONCE DAILY FOR BLOOD PRESSURE 90 capsule 1  . fish oil-omega-3 fatty acids 1000 MG capsule Take 1 g by mouth daily.    Marland Kitchen FREESTYLE LITE test strip   12  . indapamide (LOZOL) 1.25 MG tablet TAKE ONE TABLET BY MOUTH TWICE DAILY FOR BLOOD PRESSURE AND FLUID 180 tablet 1  . Lancets (FREESTYLE) lancets   12  . lisinopril (PRINIVIL,ZESTRIL) 20 MG tablet TAKE ONE TABLET BY MOUTH ONCE DAILY FOR BLOOD PRESSURE. 90 tablet 1  . pravastatin (PRAVACHOL) 40 MG tablet TAKE 1  TABLET (40 MG TOTAL) BY MOUTH DAILY. 90 tablet 1  . ranitidine (ZANTAC) 300 MG tablet TAKE ONE TABLET BY MOUTH ONCE DAILY AS NEEDED FOR  ACID  INDIGESTION 90 tablet 1   No current facility-administered medications on file prior to visit.    Medical History:  Past Medical History:  Diagnosis Date  . A-fib ([place])   . Anxiety   . Arthritis   . Diabetes mellitus   . Gout   . Hyperlipemia   . Hypertension    Allergies:  Allergies  Allergen Reactions  . Buspar [Buspirone]     dysphoria  . Lipitor [Atorvastatin]     Myalgias  . Loop Diuretics      Fatigue/weakness  . Metformin And Related     Gas/bloating  . Nsaids     GI upset  . Sulfa Antibiotics Other (See Comments)    Reaction=throat itching  . Sulfonamide Derivatives      Review of Systems:  Review of Systems  Constitutional: Negative.   HENT: Negative.   Eyes: Negative.   Respiratory: Negative.   Cardiovascular: Negative.   Gastrointestinal: Negative.   Genitourinary: Negative.   Musculoskeletal: Negative.   Skin: Negative.   Neurological: Negative.   Endo/Heme/Allergies: Negative.   Psychiatric/Behavioral: Negative.     Family history- Review and unchanged Social history- Review and unchanged Physical Exam: BP 120/68   Pulse 60   Temp 97.9 F (36.6 C)   Resp 16   Ht 5\' 5"  (1.651 m)   Wt 168 lb 6.4 oz (76.4 kg)   SpO2 96%   BMI 28.02 kg/m  Wt Readings from Last 3 Encounters:  06/28/16 168 lb 6.4 oz (76.4 kg)  05/21/16 168 lb (76.2 kg)  03/21/16 163 lb 6.4 oz (74.1 kg)   General Appearance: Well nourished, in no apparent distress. Eyes: PERRLA, EOMs, conjunctiva no swelling or erythema Sinuses: No Frontal/maxillary tenderness ENT/Mouth: Ext aud canals clear, TMs without erythema, bulging. No erythema, swelling, or exudate on post pharynx.  Tonsils not swollen or erythematous. Hearing normal.  Neck: Supple, thyroid normal.  Respiratory: Respiratory effort normal, BS equal bilaterally without rales, rhonchi, wheezing or stridor.  Cardio: RRR with no MRGs. Brisk peripheral pulses without edema.  Abdomen: Soft, + BS.  Non tender, no guarding, rebound, hernias, masses. Lymphatics: Non tender without lymphadenopathy.  Musculoskeletal: Full ROM, 5/5 strength, Normal gait Skin: Warm, dry without rashes, lesions, ecchymosis.  Neuro: Cranial nerves intact. No cerebellar symptoms.  Psych: Awake and oriented X 3, normal affect, Insight and Judgment appropriate.    Vicie Mutters, PA-C 10:47 AM [facility]

## 2016-06-29 LAB — HEMOGLOBIN A1C
HEMOGLOBIN A1C: 6.4 % — AB (ref <5.7)
MEAN PLASMA GLUCOSE: 137 mg/dL

## 2016-06-29 LAB — TSH: TSH: 1.24 mIU/L

## 2016-08-03 ENCOUNTER — Encounter: Payer: Self-pay | Admitting: *Deleted

## 2016-08-21 DIAGNOSIS — L820 Inflamed seborrheic keratosis: Secondary | ICD-10-CM | POA: Diagnosis not present

## 2016-08-30 ENCOUNTER — Encounter: Payer: Self-pay | Admitting: Internal Medicine

## 2016-09-21 ENCOUNTER — Other Ambulatory Visit: Payer: Self-pay | Admitting: Physician Assistant

## 2016-10-02 DIAGNOSIS — L905 Scar conditions and fibrosis of skin: Secondary | ICD-10-CM | POA: Diagnosis not present

## 2016-10-08 ENCOUNTER — Other Ambulatory Visit: Payer: Self-pay | Admitting: Internal Medicine

## 2016-10-11 ENCOUNTER — Ambulatory Visit (INDEPENDENT_AMBULATORY_CARE_PROVIDER_SITE_OTHER): Payer: Medicare Other | Admitting: Internal Medicine

## 2016-10-11 VITALS — BP 116/62 | HR 60 | Temp 97.8°F | Resp 16 | Ht 65.0 in | Wt 163.4 lb

## 2016-10-11 DIAGNOSIS — E782 Mixed hyperlipidemia: Secondary | ICD-10-CM

## 2016-10-11 DIAGNOSIS — Z136 Encounter for screening for cardiovascular disorders: Secondary | ICD-10-CM

## 2016-10-11 DIAGNOSIS — Z79899 Other long term (current) drug therapy: Secondary | ICD-10-CM

## 2016-10-11 DIAGNOSIS — K219 Gastro-esophageal reflux disease without esophagitis: Secondary | ICD-10-CM

## 2016-10-11 DIAGNOSIS — E559 Vitamin D deficiency, unspecified: Secondary | ICD-10-CM

## 2016-10-11 DIAGNOSIS — I480 Paroxysmal atrial fibrillation: Secondary | ICD-10-CM | POA: Diagnosis not present

## 2016-10-11 DIAGNOSIS — N182 Chronic kidney disease, stage 2 (mild): Secondary | ICD-10-CM

## 2016-10-11 DIAGNOSIS — M1000 Idiopathic gout, unspecified site: Secondary | ICD-10-CM

## 2016-10-11 DIAGNOSIS — E1122 Type 2 diabetes mellitus with diabetic chronic kidney disease: Secondary | ICD-10-CM | POA: Diagnosis not present

## 2016-10-11 DIAGNOSIS — Z1212 Encounter for screening for malignant neoplasm of rectum: Secondary | ICD-10-CM

## 2016-10-11 DIAGNOSIS — I10 Essential (primary) hypertension: Secondary | ICD-10-CM | POA: Diagnosis not present

## 2016-10-11 NOTE — Progress Notes (Signed)
ADULT & ADOLESCENT INTERNAL MEDICINE Unk Pinto, M.D.    [name]. [name], P.A.-C      [name], P.A.-C  [facility]                [address] [place], [place] [address] Telephone [phone] Telefax [phone]   Comprehensive Evaluation &  Examination     This very nice 82 y.o. [facility] presents for a comprehensive evaluation and management of multiple medical co-morbidities.  Patient has been followed for HTN, T2_NIDDM, Hyperlipidemia and Vitamin D Deficiency. Patient has hx/o gout asymptomatic and controlled on Allopurinol and also hx/o GERD like wise controlled with prudent diet & prn Zantac.       HTN predates since 89. Patient's BP has been controlled at home and patient denies any cardiac symptoms as chest pain, palpitations, shortness of breath, dizziness or ankle swelling. Today's BP is at goal - 116/62.      Patient's hyperlipidemia is controlled with diet and medications. Patient denies myalgias or other medication SE's. Last lipids were at goal: Lab Results  Component Value Date   CHOL 146 06/28/2016   HDL 49 (L) 06/28/2016   LDLCALC 76 06/28/2016   TRIG 103 06/28/2016   CHOLHDL 3.0 06/28/2016      Patient has T2_NIDDM predating since 2007and is diet controlled.  She denies reactive hypoglycemic symptoms, visual blurring, diabetic polys, or paresthesias.  She reports CBG's <125 mg% and last A1c was not at goal: Lab Results  Component Value Date   HGBA1C 6.4 (H) 06/28/2016      Finally, patient has history of Vitamin D Deficiency and last Vitamin D was still low (goal 70-100): Lab Results  Component Value Date   VD25OH 46 03/21/2016   Current Outpatient Prescriptions on File Prior to Visit  Medication Sig  . allopurinol (ZYLOPRIM) 300 MG tablet TAKE ONE TABLET BY MOUTH ONCE DAILY TO PREVENT GOUT  . aspirin EC 81 MG tablet Take 81 mg by mouth daily.  Marland Kitchen atenolol (TENORMIN) 50 MG tablet TAKE 1  TABLET (50 MG TOTAL) BY MOUTH DAILY.  . cholecalciferol (VITAMIN D) 1000 UNITS tablet Take 5,000 Units by mouth every other day.   . citalopram (CELEXA) 40 MG tablet TAKE 1 TABLET BY MOUTH DAILY.  Marland Kitchen diltiazem (CARDIZEM CD) 240 MG 24 hr capsule TAKE ONE CAPSULE BY MOUTH ONCE DAILY FOR BLOOD PRESSURE  . fish oil-omega-3 fatty acids 1000 MG capsule Take 1 g by mouth daily.  Marland Kitchen FREESTYLE LITE test strip   . indapamide (LOZOL) 1.25 MG tablet TAKE ONE TABLET BY MOUTH TWICE DAILY FOR BLOOD PRESSURE AND FLUID  . Lancets (FREESTYLE) lancets   . lisinopril (PRINIVIL,ZESTRIL) 20 MG tablet TAKE ONE TABLET BY MOUTH ONCE DAILY FOR BLOOD PRESSURE.  . pravastatin (PRAVACHOL) 40 MG tablet TAKE 1 TABLET (40 MG TOTAL) BY MOUTH DAILY.  . ranitidine (ZANTAC) 300 MG tablet TAKE ONE TABLET BY MOUTH ONCE DAILY AS NEEDED FOR  ACID  INDIGESTION   No current facility-administered medications on file prior to visit.    Allergies  Allergen Reactions  . Buspar [Buspirone]     dysphoria  . Lipitor [Atorvastatin]     Myalgias  . Loop Diuretics     Fatigue/weakness  . Metformin And Related     Gas/bloating  . Nsaids     GI upset  . Sulfa Antibiotics Other (See Comments)  Reaction=throat itching  . Sulfonamide Derivatives    Past Medical History:  Diagnosis Date  . A-fib ([place])   . Anxiety   . Arthritis   . Diabetes mellitus   . Gout   . Hyperlipemia   . Hypertension    Health Maintenance  Topic Date Due  . OPHTHALMOLOGY EXAM  02/28/1945  . PNA vac Low Risk Adult (2 of 2 - PCV13) 06/06/2004  . FOOT EXAM  09/05/2016  . HEMOGLOBIN A1C  12/26/2016  . TETANUS/TDAP  06/01/2017  . INFLUENZA VACCINE  Addressed  . DEXA SCAN  Completed   Immunization History  Administered Date(s) Administered  . Influenza Split 07/13/2014  . Influenza, High Dose Seasonal PF 04/14/2015  . Pneumococcal-Unspecified 06/07/2003  . Td 06/02/2007  . Zoster 05/30/2012   Past Surgical History:  Procedure Laterality Date  .  ABDOMINAL HYSTERECTOMY    . BIOPSY BREAST     (L) BREAST IN 1993  . KNEE ARTHROSCOPY     Family History  Problem Relation Age of Onset  . Diabetes Mother   . Hypertension Father   . CVA Father    Social History  Substance Use Topics  . Smoking status: Former Smoker    Quit date: 07/30/2006  . Smokeless tobacco: Not on file  . Alcohol use 1.0 oz/week    2 drink(s) per week    ROS Constitutional: Denies fever, chills, weight loss/gain, headaches, insomnia,  night sweats, and change in appetite. Does c/o fatigue. Eyes: Denies redness, blurred vision, diplopia, discharge, itchy, watery eyes.  ENT: Denies discharge, congestion, post nasal drip, epistaxis, sore throat, earache, hearing loss, dental pain, Tinnitus, Vertigo, Sinus pain, snoring.  Cardio: Denies chest pain, palpitations, irregular heartbeat, syncope, dyspnea, diaphoresis, orthopnea, PND, claudication, edema Respiratory: denies cough, dyspnea, DOE, pleurisy, hoarseness, laryngitis, wheezing.  Gastrointestinal: Denies dysphagia, heartburn, reflux, water brash, pain, cramps, nausea, vomiting, bloating, diarrhea, constipation, hematemesis, melena, hematochezia, jaundice, hemorrhoids Genitourinary: Denies dysuria, frequency, urgency, nocturia, hesitancy, discharge, hematuria, flank pain Breast: Breast lumps, nipple discharge, bleeding.  Musculoskeletal: Denies arthralgia, myalgia, stiffness, Jt. Swelling, pain, limp, and strain/sprain. Denies falls. Skin: Denies puritis, rash, hives, warts, acne, eczema, changing in skin lesion Neuro: No weakness, tremor, incoordination, spasms, paresthesia, pain Psychiatric: Denies confusion, memory loss, sensory loss. Denies Depression. Endocrine: Denies change in weight, skin, hair change, nocturia, and paresthesia, diabetic polys, visual blurring, hyper / hypo glycemic episodes.  Heme/Lymph: No excessive bleeding, bruising, enlarged lymph nodes.  Physical Exam  BP 116/62   Pulse 60   Temp  97.8 F (36.6 C)   Resp 16   Ht 5\' 5"  (1.651 m)   Wt 163 lb 6.4 oz (74.1 kg)   BMI 27.19 kg/m   General Appearance: Well nourished and in no apparent distress.  Eyes: PERRLA, EOMs, conjunctiva no swelling or erythema, normal fundi and vessels. Sinuses: No frontal/maxillary tenderness ENT/Mouth: EACs patent / TMs  nl. Nares clear without erythema, swelling, mucoid exudates. Oral hygiene is good. No erythema, swelling, or exudate. Tongue normal, non-obstructing. Tonsils not swollen or erythematous. Hearing normal.  Neck: Supple, thyroid normal. No bruits, nodes or JVD. Respiratory: Respiratory effort normal.  BS equal and clear bilateral without rales, rhonci, wheezing or stridor. Cardio: Heart sounds are normal with regular rate and rhythm and no murmurs, rubs or gallops. Peripheral pulses are normal and equal bilaterally without edema. No aortic or femoral bruits. Chest: symmetric with normal excursions and percussion. Breasts: Symmetric, without lumps, nipple discharge, retractions, or fibrocystic changes.  Abdomen: Flat, soft  with bowel sounds active. Nontender, no guarding, rebound, hernias, masses, or organomegaly.  Lymphatics: Non tender without lymphadenopathy.  Genitourinary:  Musculoskeletal: Full ROM all peripheral extremities, joint stability, 5/5 strength, and normal gait. Skin: Warm and dry without rashes, lesions, cyanosis, clubbing or  ecchymosis.  Neuro: Cranial nerves intact, reflexes equal bilaterally. Normal muscle tone, no cerebellar symptoms. Sensation intact to touch , vibratory and Monofilament to the toes bilaterally. Pysch: Alert and oriented X 3, normal affect, Insight and Judgment appropriate.   Assessment and Plan  1. Essential hypertension  - Microalbumin / creatinine urine ratio - EKG 12-Lead - Urinalysis, Routine w reflex microscopic - Uric acid - CBC with Differential/Platelet - BASIC METABOLIC PANEL WITH GFR - Magnesium - TSH  2. Mixed  hyperlipidemia  - EKG 12-Lead - Hepatic function panel - Lipid panel - TSH  3. T2_NIDDM w/CKD2  ([place])  - Microalbumin / creatinine urine ratio - EKG 12-Lead - HM DIABETES FOOT EXAM - LOW EXTREMITY NEUR EXAM DOCUM  - Hemoglobin A1c - Insulin, random  4. Vitamin D deficiency  - VITAMIN D 25 Hydroxy  5. Paroxysmal atrial fibrillation ([facility])  - EKG 12-Lead  6. Idiopathic gout   7. Gastroesophageal reflux disease   8. Screening for rectal cancer  - POC Hemoccult Bld/Stl   9. Screening for ischemic heart disease  - EKG 12-Lead  10. Medication management  - Urinalysis, Routine w reflex microscopic - Uric acid - CBC with Differential/Platelet - BASIC METABOLIC PANEL WITH GFR - Hepatic function panel - Magnesium - Lipid panel - TSH - Hemoglobin A1c - Insulin, random - VITAMIN D 25 Hydroxy        Continue prudent diet as discussed, weight control, BP monitoring, regular exercise, and medications. Discussed med's effects and SE's. Screening labs and tests as requested with regular follow-up as recommended. Over 40 minutes of exam, counseling, chart review and high complex critical decision making was performed.

## 2016-10-11 NOTE — Patient Instructions (Addendum)

## 2016-10-12 LAB — URINALYSIS, ROUTINE W REFLEX MICROSCOPIC
BILIRUBIN URINE: NEGATIVE
Glucose, UA: NEGATIVE
HGB URINE DIPSTICK: NEGATIVE
KETONES UR: NEGATIVE
Leukocytes, UA: NEGATIVE
NITRITE: NEGATIVE
Protein, ur: NEGATIVE
SPECIFIC GRAVITY, URINE: 1.021 (ref 1.001–1.035)
pH: 6 (ref 5.0–8.0)

## 2016-10-12 LAB — HEMOGLOBIN A1C
HEMOGLOBIN A1C: 6.3 % — AB (ref <5.7)
Mean Plasma Glucose: 134 mg/dL

## 2016-10-12 LAB — HEPATIC FUNCTION PANEL
ALT: 17 U/L (ref 6–29)
AST: 19 U/L (ref 10–35)
Albumin: 4.1 g/dL (ref 3.6–5.1)
Alkaline Phosphatase: 36 U/L (ref 33–130)
BILIRUBIN DIRECT: 0.1 mg/dL (ref <=0.2)
BILIRUBIN INDIRECT: 0.4 mg/dL (ref 0.2–1.2)
Total Bilirubin: 0.5 mg/dL (ref 0.2–1.2)
Total Protein: 6.3 g/dL (ref 6.1–8.1)

## 2016-10-12 LAB — CBC WITH DIFFERENTIAL/PLATELET
BASOS ABS: 76 {cells}/uL (ref 0–200)
Basophils Relative: 1 %
EOS PCT: 1 %
Eosinophils Absolute: 76 cells/uL (ref 15–500)
HEMATOCRIT: 42.8 % (ref 35.0–45.0)
HEMOGLOBIN: 14 g/dL (ref 11.7–15.5)
LYMPHS ABS: 2888 {cells}/uL (ref 850–3900)
LYMPHS PCT: 38 %
MCH: 32.6 pg (ref 27.0–33.0)
MCHC: 32.7 g/dL (ref 32.0–36.0)
MCV: 99.8 fL (ref 80.0–100.0)
MONO ABS: 608 {cells}/uL (ref 200–950)
MPV: 11 fL (ref 7.5–12.5)
Monocytes Relative: 8 %
NEUTROS PCT: 52 %
Neutro Abs: 3952 cells/uL (ref 1500–7800)
Platelets: 195 10*3/uL (ref 140–400)
RBC: 4.29 MIL/uL (ref 3.80–5.10)
RDW: 14.3 % (ref 11.0–15.0)
WBC: 7.6 10*3/uL (ref 3.8–10.8)

## 2016-10-12 LAB — BASIC METABOLIC PANEL WITH GFR
BUN: 27 mg/dL — ABNORMAL HIGH (ref 7–25)
CHLORIDE: 100 mmol/L (ref 98–110)
CO2: 29 mmol/L (ref 20–31)
Calcium: 9.4 mg/dL (ref 8.6–10.4)
Creat: 1.02 mg/dL — ABNORMAL HIGH (ref 0.60–0.93)
GFR, EST NON AFRICAN AMERICAN: 52 mL/min — AB (ref >=60)
GFR, Est African American: 60 mL/min (ref >=60)
Glucose, Bld: 106 mg/dL — ABNORMAL HIGH (ref 65–99)
Potassium: 3.8 mmol/L (ref 3.5–5.3)
SODIUM: 140 mmol/L (ref 135–146)

## 2016-10-12 LAB — LIPID PANEL
CHOL/HDL RATIO: 3 ratio (ref <5.0)
Cholesterol: 152 mg/dL (ref <200)
HDL: 51 mg/dL (ref >50)
LDL Cholesterol: 65 mg/dL (ref <100)
Triglycerides: 179 mg/dL — ABNORMAL HIGH (ref <150)
VLDL: 36 mg/dL — AB (ref <30)

## 2016-10-12 LAB — TSH: TSH: 1.39 m[IU]/L

## 2016-10-12 LAB — MICROALBUMIN / CREATININE URINE RATIO
CREATININE, URINE: 119 mg/dL (ref 20–320)
Microalb Creat Ratio: 3 mcg/mg creat (ref <30)
Microalb, Ur: 0.3 mg/dL

## 2016-10-12 LAB — URIC ACID: URIC ACID, SERUM: 4.4 mg/dL (ref 2.5–7.0)

## 2016-10-12 LAB — VITAMIN D 25 HYDROXY (VIT D DEFICIENCY, FRACTURES): Vit D, 25-Hydroxy: 47 ng/mL (ref 30–100)

## 2016-10-12 LAB — MAGNESIUM: Magnesium: 1.8 mg/dL (ref 1.5–2.5)

## 2016-10-14 ENCOUNTER — Encounter: Payer: Self-pay | Admitting: Internal Medicine

## 2016-10-15 LAB — INSULIN, RANDOM: Insulin: 7.1 u[IU]/mL (ref 2.0–19.6)

## 2016-11-01 ENCOUNTER — Other Ambulatory Visit: Payer: Self-pay | Admitting: Internal Medicine

## 2016-11-19 DIAGNOSIS — M545 Low back pain: Secondary | ICD-10-CM | POA: Diagnosis not present

## 2016-11-20 ENCOUNTER — Other Ambulatory Visit: Payer: Self-pay | Admitting: *Deleted

## 2016-11-20 DIAGNOSIS — Z1212 Encounter for screening for malignant neoplasm of rectum: Secondary | ICD-10-CM

## 2016-11-20 LAB — POC HEMOCCULT BLD/STL (HOME/3-CARD/SCREEN)
Card #2 Fecal Occult Blod, POC: NEGATIVE
FECAL OCCULT BLD: NEGATIVE
FECAL OCCULT BLD: NEGATIVE

## 2016-11-22 DIAGNOSIS — M545 Low back pain: Secondary | ICD-10-CM | POA: Diagnosis not present

## 2016-11-27 DIAGNOSIS — M545 Low back pain: Secondary | ICD-10-CM | POA: Diagnosis not present

## 2016-11-30 DIAGNOSIS — M545 Low back pain: Secondary | ICD-10-CM | POA: Diagnosis not present

## 2016-12-10 DIAGNOSIS — M545 Low back pain: Secondary | ICD-10-CM | POA: Diagnosis not present

## 2016-12-19 DIAGNOSIS — M545 Low back pain: Secondary | ICD-10-CM | POA: Diagnosis not present

## 2016-12-28 ENCOUNTER — Other Ambulatory Visit: Payer: Self-pay | Admitting: Internal Medicine

## 2017-01-13 NOTE — Progress Notes (Signed)
MEDICARE ANNUAL WELLNESS VISIT AND FOLLOW UP  Assessment:    Essential hypertension - continue medications, DASH diet, exercise and monitor at home. Call if greater than 130/80.  -     CBC with Differential/Platelet -     BASIC METABOLIC PANEL WITH GFR -     Hepatic function panel -     TSH  Paroxysmal atrial fibrillation (HCC) Rate controlled and in NSR with cardizem, not on anticoagulation with CHADSVASC of 5, discussed with patient, has not had Afib over 2-3 years, understands risk of stroke and declines at this time, will call office or go to ER if any symptoms, continue to monitor EKG in the office.   Gastroesophageal reflux disease, esophagitis presence not specified Continue PPI/H2 blocker, diet discussed  Type 2 diabetes mellitus with stage 2 chronic kidney disease, without long-term current use of insulin ([place]) Discussed general issues about diabetes pathophysiology and management., Educational material distributed., Suggested low cholesterol diet., Encouraged aerobic exercise., Discussed foot care., Reminded to get yearly retinal exam. -     Hemoglobin A1c  Mixed hyperlipidemia -continue medications, check lipids, decrease fatty foods, increase activity.  -     Lipid panel  Idiopathic gout, unspecified chronicity, unspecified site Gout- recheck Uric acid as needed, Diet discussed, continue medications.  Vitamin D deficiency  Medication management -     Magnesium  Encounter for Medicare annual wellness exam  Over 40 minutes of exam, counseling, chart review and critical decision making was performed Future Appointments Date Time Provider [place]  04/16/2017 10:30 AM Unk Pinto, MD [facility] None  11/12/2017 3:00 PM Unk Pinto, MD [facility] None    Plan:   During the course of the visit the patient was educated and counseled about appropriate screening and preventive services including:    Pneumococcal vaccine   Prevnar 13  Influenza  vaccine  Td vaccine  Screening electrocardiogram  Bone densitometry screening  Colorectal cancer screening  Diabetes screening  Glaucoma screening  Nutrition counseling   Advanced directives: requested   Subjective:  Ruth Delgado is a 82 y.o. female who presents for Medicare Annual Wellness Visit and 3 month follow up for HTN, chol, preDM.  Her blood pressure has been controlled at home, today their BP is BP: 124/80  She does workout. She denies chest pain, shortness of breath, dizziness. She has a history of Afib, rate controlled, low risk for stroke, patient declines anticoagulation.   CHA2DS2/VAS Stroke Risk Points      5 >= 2 Points: High Risk  1 - 1.99 Points: Medium Risk  0 Points: Low Risk    The patient's score has not changed in the past year.:  No Change     Details    Note: External data might be a factor in metrics not marked with    Points Metrics   This score determines the patient's risk of having a stroke if the  patient has atrial fibrillation.       0 Has Congestive Heart Failure:  No   0 Has Vascular Disease:  No   1 Has Hypertension:  Yes   2 Age:  81   1 Has Diabetes:  Yes   0 Had Stroke:  No Had TIA:  No Had thromboembolism:  No   1 Female:  Yes      She is on cholesterol medication and denies myalgias. Her cholesterol is at goal. The cholesterol last visit was:   Lab Results  Component Value Date  CHOL 152 10/11/2016   HDL 51 10/11/2016   LDLCALC 65 10/11/2016   TRIG 179 (H) 10/11/2016   CHOLHDL 3.0 10/11/2016   She has been working on diet and exercise for Diabetes with diabetic chronic kidney disease, she is on bASA, she is on ACE/ARB, follows with Dr. Chalmers Cater, follows in Sept and denies increased appetite, nausea, paresthesia of the feet and polydipsia. Last A1C was:  Lab Results  Component Value Date   HGBA1C 6.3 (H) 10/11/2016   Last GFR: Lab Results  Component Value Date   GFRNONAA 52 (L) 10/11/2016   Patient is on  Vitamin D supplement.   Lab Results  Component Value Date   VD25OH 47 10/11/2016     Patient is on allopurinol for gout and does not report a recent flare.  Lab Results  Component Value Date   LABURIC 4.4 10/11/2016   BMI is Body mass index is 26.46 kg/m., she is working on diet and exercise. Wt Readings from Last 3 Encounters:  01/14/17 159 lb (72.1 kg)  10/11/16 163 lb 6.4 oz (74.1 kg)  06/28/16 168 lb 6.4 oz (76.4 kg)   Medication Review: Current Outpatient Prescriptions on File Prior to Visit  Medication Sig Dispense Refill  . allopurinol (ZYLOPRIM) 300 MG tablet TAKE ONE TABLET BY MOUTH ONCE DAILY TO PREVENT GOUT 90 tablet 1  . aspirin EC 81 MG tablet Take 81 mg by mouth daily.    Marland Kitchen atenolol (TENORMIN) 50 MG tablet TAKE 1 TABLET (50 MG TOTAL) BY MOUTH DAILY. 90 tablet 1  . cholecalciferol (VITAMIN D) 1000 UNITS tablet Take 5,000 Units by mouth every other day.     . citalopram (CELEXA) 40 MG tablet TAKE 1 TABLET BY MOUTH DAILY. 90 tablet 1  . diltiazem (CARDIZEM CD) 240 MG 24 hr capsule TAKE 1 CAPSULE BY MOUTH ONCE DAILY FOR BLOOD PRESSURE 90 capsule 1  . fish oil-omega-3 fatty acids 1000 MG capsule Take 1 g by mouth daily.    Marland Kitchen FREESTYLE LITE test strip   12  . indapamide (LOZOL) 1.25 MG tablet TAKE ONE TABLET BY MOUTH TWICE DAILY FOR BLOOD PRESSURE AND FLUID 180 tablet 1  . Lancets (FREESTYLE) lancets   12  . lisinopril (PRINIVIL,ZESTRIL) 20 MG tablet TAKE ONE TABLET BY MOUTH ONCE DAILY FOR BLOOD PRESSURE. 90 tablet 1  . pravastatin (PRAVACHOL) 40 MG tablet TAKE 1 TABLET (40 MG TOTAL) BY MOUTH DAILY. 90 tablet 1  . ranitidine (ZANTAC) 300 MG tablet TAKE ONE TABLET BY MOUTH ONCE DAILY AS NEEDED FOR  ACID  INDIGESTION 90 tablet 1  . triamcinolone cream (KENALOG) 0.1 %   2   No current facility-administered medications on file prior to visit.     Allergies  Allergen Reactions  . Buspar [Buspirone]     dysphoria  . Lipitor [Atorvastatin]     Myalgias  . Loop Diuretics      Fatigue/weakness  . Metformin And Related     Gas/bloating  . Nsaids     GI upset  . Sulfa Antibiotics Other (See Comments)    Reaction=throat itching  . Sulfonamide Derivatives     Current Problems (verified) Patient Active Problem List   Diagnosis Date Noted  . Esophageal reflux 09/06/2015  . Atrial fibrillation ([place]) 03/17/2014  . Vitamin D deficiency 06/08/2013  . Medication management 06/08/2013  . Hypertension   . Hyperlipemia   . Gout   . T2_NIDDM w/CKD2 (GFR 67 ml/min)      Screening Tests Immunization  History  Administered Date(s) Administered  . Influenza Split 07/13/2014  . Influenza, High Dose Seasonal PF 04/14/2015  . Pneumococcal-Unspecified 06/07/2003  . Td 06/02/2007  . Zoster 05/30/2012   Last colonoscopy: refused cologuard Last mammogram: 02/2016 DEXA:2016  Prior vaccinations: TD or Tdap: 2013  Influenza: 2016  Pneumococcal: 2008 Prevnar13: Today Shingles/Zostavax: 2013  Names of Other Physician/Practitioners you currently use: 1. [facility] here for primary care 2. Dr. Einar Gip, eye doctor, last visit Jan 2018 3. Dr. Donn Pierini, dentist, last visit 07/2016 Patient Care Team: Unk Pinto, MD as PCP - General (Internal Medicine) Jacelyn Pi, MD as Consulting Physician (Endocrinology)  SURGICAL HISTORY She  has a past surgical history that includes Knee arthroscopy; Abdominal hysterectomy; and Biopsy breast. FAMILY HISTORY Her family history includes CVA in her father; Diabetes in her mother; Hypertension in her father. SOCIAL HISTORY She  reports that she quit smoking about 10 years ago. She has never used smokeless tobacco. She reports that she drinks about 1.0 oz of alcohol per week .   MEDICARE WELLNESS OBJECTIVES: Physical activity: Current Exercise Habits: Home exercise routine, Type of exercise: walking, Time (Minutes): 30, Frequency (Times/Week): 7, Weekly Exercise (Minutes/Week): 210,  Intensity: Mild Cardiac risk factors: Cardiac Risk Factors include: advanced age (>87men, >10 women);diabetes mellitus;dyslipidemia;hypertension Depression/mood screen:   Depression screen [facility] 2/9 01/14/2017  Decreased Interest 0  Down, Depressed, Hopeless 0  PHQ - 2 Score 0    ADLs:  In your present state of health, do you have any difficulty performing the following activities: 01/14/2017 10/14/2016  Hearing? N N  Vision? N N  Difficulty concentrating or making decisions? N N  Walking or climbing stairs? N N  Dressing or bathing? N N  Doing errands, shopping? N N  Some recent data might be hidden    Cognitive Testing  Alert? Yes  Normal Appearance?Yes  Oriented to person? Yes  Place? Yes   Time? Yes  Recall of three objects?  Yes  Can perform simple calculations? Yes  Displays appropriate judgment?Yes  Can read the correct time from a watch face?Yes  EOL planning: Does Patient Have a Medical Advance Directive?: Yes Type of Advance Directive: Healthcare Power of Attorney, Living will Copy of [place] in Chart?: No - copy requested  Review of Systems  Constitutional: Negative.   HENT: Negative.   Eyes: Negative.   Respiratory: Negative.   Cardiovascular: Negative.   Gastrointestinal: Negative.   Genitourinary: Negative.   Musculoskeletal: Negative.   Skin: Negative.   Neurological: Negative.   Endo/Heme/Allergies: Negative.   Psychiatric/Behavioral: Negative.      Objective:     Today's Vitals   01/14/17 1033  BP: 124/80  Pulse: 71  Resp: 16  Temp: 97.3 F (36.3 C)  SpO2: 97%  Weight: 159 lb (72.1 kg)  Height: 5\' 5"  (1.651 m)  PainSc: 1   PainLoc: Leg   Body mass index is 26.46 kg/m.  General appearance: alert, no distress, WD/WN, female HEENT: normocephalic, sclerae anicteric, TMs pearly, nares patent, no discharge or erythema, pharynx normal Oral cavity: MMM, no lesions Neck: supple, no lymphadenopathy, no thyromegaly, no  masses Heart: RRR, normal S1, S2, no murmurs Lungs: CTA bilaterally, no wheezes, rhonchi, or rales Abdomen: +bs, soft, non tender, non distended, no masses, no hepatomegaly, no splenomegaly Musculoskeletal: nontender, no swelling, no obvious deformity Extremities: no edema, no cyanosis, no clubbing Pulses: 2+ symmetric, upper and lower extremities, normal cap refill Neurological: alert, oriented x 3, CN2-12  intact, strength normal upper extremities and lower extremities, sensation normal throughout, DTRs 2+ throughout, no cerebellar signs, gait normal Psychiatric: normal affect, behavior normal, pleasant   Medicare Attestation I have personally reviewed: The patient's medical and social history Their use of alcohol, tobacco or illicit drugs Their current medications and supplements The patient's functional ability including ADLs,fall risks, home safety risks, cognitive, and hearing and visual impairment Diet and physical activities Evidence for depression or mood disorders  The patient's weight, height, BMI, and visual acuity have been recorded in the chart.  I have made referrals, counseling, and provided education to the patient based on review of the above and I have provided the patient with a written personalized care plan for preventive services.    Future Appointments Date Time Provider [place]  04/16/2017 10:30 AM Unk Pinto, MD [facility] None  11/12/2017 3:00 PM Unk Pinto, MD [facility] None     Vicie Mutters, PA-C   01/14/2017

## 2017-01-14 ENCOUNTER — Encounter: Payer: Self-pay | Admitting: Physician Assistant

## 2017-01-14 ENCOUNTER — Ambulatory Visit (INDEPENDENT_AMBULATORY_CARE_PROVIDER_SITE_OTHER): Payer: Medicare Other | Admitting: Physician Assistant

## 2017-01-14 VITALS — BP 124/80 | HR 71 | Temp 97.3°F | Resp 16 | Ht 65.0 in | Wt 159.0 lb

## 2017-01-14 DIAGNOSIS — Z79899 Other long term (current) drug therapy: Secondary | ICD-10-CM | POA: Diagnosis not present

## 2017-01-14 DIAGNOSIS — K219 Gastro-esophageal reflux disease without esophagitis: Secondary | ICD-10-CM

## 2017-01-14 DIAGNOSIS — E782 Mixed hyperlipidemia: Secondary | ICD-10-CM | POA: Diagnosis not present

## 2017-01-14 DIAGNOSIS — N182 Chronic kidney disease, stage 2 (mild): Secondary | ICD-10-CM

## 2017-01-14 DIAGNOSIS — E1122 Type 2 diabetes mellitus with diabetic chronic kidney disease: Secondary | ICD-10-CM

## 2017-01-14 DIAGNOSIS — I10 Essential (primary) hypertension: Secondary | ICD-10-CM | POA: Diagnosis not present

## 2017-01-14 DIAGNOSIS — R6889 Other general symptoms and signs: Secondary | ICD-10-CM

## 2017-01-14 DIAGNOSIS — Z0001 Encounter for general adult medical examination with abnormal findings: Secondary | ICD-10-CM

## 2017-01-14 DIAGNOSIS — I480 Paroxysmal atrial fibrillation: Secondary | ICD-10-CM | POA: Diagnosis not present

## 2017-01-14 DIAGNOSIS — M1000 Idiopathic gout, unspecified site: Secondary | ICD-10-CM

## 2017-01-14 DIAGNOSIS — E559 Vitamin D deficiency, unspecified: Secondary | ICD-10-CM

## 2017-01-14 DIAGNOSIS — Z0000 Encounter for general adult medical examination without abnormal findings: Secondary | ICD-10-CM

## 2017-01-14 LAB — CBC WITH DIFFERENTIAL/PLATELET
BASOS PCT: 1 %
Basophils Absolute: 67 cells/uL (ref 0–200)
EOS ABS: 134 {cells}/uL (ref 15–500)
Eosinophils Relative: 2 %
HEMATOCRIT: 41.2 % (ref 35.0–45.0)
HEMOGLOBIN: 13.9 g/dL (ref 11.7–15.5)
LYMPHS ABS: 2613 {cells}/uL (ref 850–3900)
Lymphocytes Relative: 39 %
MCH: 33.5 pg — ABNORMAL HIGH (ref 27.0–33.0)
MCHC: 33.7 g/dL (ref 32.0–36.0)
MCV: 99.3 fL (ref 80.0–100.0)
MONO ABS: 335 {cells}/uL (ref 200–950)
MPV: 10.3 fL (ref 7.5–12.5)
Monocytes Relative: 5 %
NEUTROS ABS: 3551 {cells}/uL (ref 1500–7800)
Neutrophils Relative %: 53 %
Platelets: 191 10*3/uL (ref 140–400)
RBC: 4.15 MIL/uL (ref 3.80–5.10)
RDW: 14.2 % (ref 11.0–15.0)
WBC: 6.7 10*3/uL (ref 3.8–10.8)

## 2017-01-14 NOTE — Patient Instructions (Signed)
Cologuard is an easy to use noninvasive colon cancer screening test based on the latest advances in stool DNA science.   Colon cancer is 3rd most diagnosed cancer and 2nd leading cause of death in both men and women 80 years of age and older despite being one of the most preventable and treatable cancers if found early.  4 of out 5 people diagnosed with colon cancer have NO prior family history.  When caught EARLY 90% of colon cancer is curable.   You have agreed to do a Cologuard screening and have declined a colonoscopy in spite of being explained the risks and benefits of the colonoscopy in detail, including cancer and death. Please understand that this is test not as sensitive or specific as a colonoscopy and you are still recommended to get a colonoscopy.   You will receive a short call from Cologuard Customer support center at [facility], when you receive a call they will say they are from [facility],  to confirm your mailing address and give you more information.  When they calll you, it will appear on the caller ID as "[facility]" or in some cases only this number will appear, [phone].   [facility] will ship your collection kit directly to you. You will collect a single stool sample in the privacy of your own home, no special preparation required. You will return the kit via UPS pre-paid shipping or pick-up, in the same box it arrived in. Then I will contact you to discuss your results after I receive them from the laboratory.   If you have any questions or concerns, Cologuard Customer Support Specialist are available 24 hours a day, 7 days a week at [phone] or go to [email or web address].         

## 2017-01-15 LAB — MAGNESIUM: Magnesium: 1.8 mg/dL (ref 1.5–2.5)

## 2017-01-15 LAB — HEPATIC FUNCTION PANEL
ALK PHOS: 41 U/L (ref 33–130)
ALT: 17 U/L (ref 6–29)
AST: 18 U/L (ref 10–35)
Albumin: 4.1 g/dL (ref 3.6–5.1)
BILIRUBIN INDIRECT: 0.4 mg/dL (ref 0.2–1.2)
Bilirubin, Direct: 0.1 mg/dL (ref <=0.2)
TOTAL PROTEIN: 6.3 g/dL (ref 6.1–8.1)
Total Bilirubin: 0.5 mg/dL (ref 0.2–1.2)

## 2017-01-15 LAB — LIPID PANEL
Cholesterol: 158 mg/dL (ref <200)
HDL: 52 mg/dL (ref >50)
LDL CALC: 78 mg/dL (ref <100)
Total CHOL/HDL Ratio: 3 Ratio (ref <5.0)
Triglycerides: 140 mg/dL (ref <150)
VLDL: 28 mg/dL (ref <30)

## 2017-01-15 LAB — BASIC METABOLIC PANEL WITH GFR
BUN: 23 mg/dL (ref 7–25)
CHLORIDE: 102 mmol/L (ref 98–110)
CO2: 29 mmol/L (ref 20–31)
Calcium: 9.2 mg/dL (ref 8.6–10.4)
Creat: 0.87 mg/dL (ref 0.60–0.93)
GFR, EST AFRICAN AMERICAN: 73 mL/min (ref >=60)
GFR, EST NON AFRICAN AMERICAN: 64 mL/min (ref >=60)
GLUCOSE: 107 mg/dL — AB (ref 65–99)
POTASSIUM: 3.7 mmol/L (ref 3.5–5.3)
Sodium: 141 mmol/L (ref 135–146)

## 2017-01-15 LAB — TSH: TSH: 1.58 mIU/L

## 2017-01-15 LAB — HEMOGLOBIN A1C
HEMOGLOBIN A1C: 6.4 % — AB (ref <5.7)
Mean Plasma Glucose: 137 mg/dL

## 2017-01-15 NOTE — Progress Notes (Signed)
Pt aware of lab results & voiced understanding of those results.

## 2017-01-31 ENCOUNTER — Other Ambulatory Visit: Payer: Self-pay | Admitting: Internal Medicine

## 2017-02-11 ENCOUNTER — Other Ambulatory Visit: Payer: Self-pay | Admitting: Internal Medicine

## 2017-03-25 DIAGNOSIS — Z1231 Encounter for screening mammogram for malignant neoplasm of breast: Secondary | ICD-10-CM | POA: Diagnosis not present

## 2017-03-25 DIAGNOSIS — M8589 Other specified disorders of bone density and structure, multiple sites: Secondary | ICD-10-CM | POA: Diagnosis not present

## 2017-03-25 LAB — HM MAMMOGRAPHY

## 2017-04-01 ENCOUNTER — Other Ambulatory Visit: Payer: Self-pay | Admitting: Internal Medicine

## 2017-04-03 ENCOUNTER — Other Ambulatory Visit: Payer: Self-pay | Admitting: *Deleted

## 2017-04-03 ENCOUNTER — Other Ambulatory Visit: Payer: Self-pay | Admitting: Internal Medicine

## 2017-04-03 MED ORDER — CITALOPRAM HYDROBROMIDE 40 MG PO TABS: 40.0000 mg | ORAL_TABLET | Freq: Every day | ORAL | 1 refills | Status: DC

## 2017-04-15 ENCOUNTER — Encounter: Payer: Self-pay | Admitting: Internal Medicine

## 2017-04-16 ENCOUNTER — Encounter: Payer: Self-pay | Admitting: Internal Medicine

## 2017-04-16 ENCOUNTER — Ambulatory Visit (INDEPENDENT_AMBULATORY_CARE_PROVIDER_SITE_OTHER): Payer: Medicare Other | Admitting: Internal Medicine

## 2017-04-16 VITALS — BP 124/68 | HR 52 | Temp 97.5°F | Resp 16 | Ht 65.0 in | Wt 158.2 lb

## 2017-04-16 DIAGNOSIS — Z79899 Other long term (current) drug therapy: Secondary | ICD-10-CM | POA: Diagnosis not present

## 2017-04-16 DIAGNOSIS — E1122 Type 2 diabetes mellitus with diabetic chronic kidney disease: Secondary | ICD-10-CM

## 2017-04-16 DIAGNOSIS — N182 Chronic kidney disease, stage 2 (mild): Secondary | ICD-10-CM | POA: Diagnosis not present

## 2017-04-16 DIAGNOSIS — E782 Mixed hyperlipidemia: Secondary | ICD-10-CM

## 2017-04-16 DIAGNOSIS — E559 Vitamin D deficiency, unspecified: Secondary | ICD-10-CM

## 2017-04-16 DIAGNOSIS — Z23 Encounter for immunization: Secondary | ICD-10-CM

## 2017-04-16 DIAGNOSIS — M1000 Idiopathic gout, unspecified site: Secondary | ICD-10-CM | POA: Diagnosis not present

## 2017-04-16 DIAGNOSIS — K219 Gastro-esophageal reflux disease without esophagitis: Secondary | ICD-10-CM

## 2017-04-16 DIAGNOSIS — I10 Essential (primary) hypertension: Secondary | ICD-10-CM

## 2017-04-16 NOTE — Patient Instructions (Signed)
Recommend Adult Low Dose Aspirin or  coated  Aspirin 81 mg daily  To reduce risk of Colon Cancer 20 %,  Skin Cancer 26 % ,  Melanoma 46%  and  Pancreatic cancer 60% +++++++++++++++++++++++++ Vitamin D goal  is between 70-100.  Please make sure that you are taking your Vitamin D as directed.  It is very important as a natural anti-inflammatory  helping hair, skin, and nails, as well as reducing stroke and heart attack risk.  It helps your bones and helps with mood. It also decreases numerous cancer risks so please take it as directed.  Low Vit D is associated with a 200-300% higher risk for CANCER  and 200-300% higher risk for HEART   ATTACK  &  STROKE.   ...................................... It is also associated with higher death rate at younger ages,  autoimmune diseases like Rheumatoid arthritis, Lupus, Multiple Sclerosis.    Also many other serious conditions, like depression, Alzheimer's Dementia, infertility, muscle aches, fatigue, fibromyalgia - just to name a few. ++++++++++++++++++++ Recommend the book "The END of DIETING" by Dr Joel Fuhrman  & the book "The END of DIABETES " by Dr Joel Fuhrman At [employer] - get book & Audio CD's    Being diabetic has a  300% increased risk for heart attack, stroke, cancer, and alzheimer- type vascular dementia. It is very important that you work harder with diet by avoiding all foods that are white. Avoid white rice (brown & wild rice is OK), white potatoes (sweetpotatoes in moderation is OK), White bread or wheat bread or anything made out of white flour like bagels, donuts, rolls, buns, biscuits, cakes, pastries, cookies, pizza crust, and pasta (made from white flour & egg whites) - vegetarian pasta or spinach or wheat pasta is OK. Multigrain breads like Arnold's or Pepperidge Farm, or multigrain sandwich thins or flatbreads.  Diet, exercise and weight loss can reverse and cure diabetes in the early stages.  Diet, exercise and weight loss is  very important in the control and prevention of complications of diabetes which affects every system in your body, ie. Brain - dementia/stroke, eyes - glaucoma/blindness, heart - heart attack/heart failure, kidneys - dialysis, stomach - gastric paralysis, intestines - malabsorption, nerves - severe painful neuritis, circulation - gangrene & loss of a leg(s), and finally cancer and Alzheimers.    I recommend avoid fried & greasy foods,  sweets/candy, white rice (brown or wild rice or Quinoa is OK), white potatoes (sweet potatoes are OK) - anything made from white flour - bagels, doughnuts, rolls, buns, biscuits,white and wheat breads, pizza crust and traditional pasta made of white flour & egg white(vegetarian pasta or spinach or wheat pasta is OK).  Multi-grain bread is OK - like multi-grain flat bread or sandwich thins. Avoid alcohol in excess. Exercise is also important.    Eat all the vegetables you want - avoid meat, especially red meat and dairy - especially cheese.  Cheese is the most concentrated form of trans-fats which is the worst thing to clog up our arteries. Veggie cheese is OK which can be found in the fresh produce section at [employer] or [employer] or [employer]  +++++++++++++++++++++ DASH Eating Plan  DASH stands for "Dietary Approaches to Stop Hypertension."   The DASH eating plan is a healthy eating plan that has been shown to reduce high blood pressure (hypertension). Additional health benefits may include reducing the risk of type 2 diabetes mellitus, heart disease, and stroke. The DASH eating plan may also   help with weight loss. WHAT DO I NEED TO KNOW ABOUT THE DASH EATING PLAN? For the DASH eating plan, you will follow these general guidelines:  Choose foods with a percent daily value for sodium of less than 5% (as listed on the food label).  Use salt-free seasonings or herbs instead of table salt or sea salt.  Check with your health care provider or pharmacist before  using salt substitutes.  Eat lower-sodium products, often labeled as "lower sodium" or "no salt added."  Eat fresh foods.  Eat more vegetables, fruits, and low-fat dairy products.  Choose whole grains. Look for the word "whole" as the first word in the ingredient list.  Choose fish   Limit sweets, desserts, sugars, and sugary drinks.  Choose heart-healthy fats.  Eat veggie cheese   Eat more home-cooked food and less restaurant, buffet, and fast food.  Limit fried foods.  [place] foods using methods other than frying.  Limit canned vegetables. If you do use them, rinse them well to decrease the sodium.  When eating at a restaurant, ask that your food be prepared with less salt, or no salt if possible.                      WHAT FOODS CAN I EAT? Read Dr Joel Fuhrman's books on The End of Dieting & The End of Diabetes  Grains Whole grain or whole wheat bread. Brown rice. Whole grain or whole wheat pasta. Quinoa, bulgur, and whole grain cereals. Low-sodium cereals. Corn or whole wheat flour tortillas. Whole grain cornbread. Whole grain crackers. Low-sodium crackers.  Vegetables Fresh or frozen vegetables (raw, steamed, roasted, or grilled). Low-sodium or reduced-sodium tomato and vegetable juices. Low-sodium or reduced-sodium tomato sauce and paste. Low-sodium or reduced-sodium canned vegetables.   Fruits All fresh, canned (in natural juice), or frozen fruits.  Protein Products  All fish and seafood.  Dried beans, peas, or lentils. Unsalted nuts and seeds. Unsalted canned beans.  Dairy Low-fat dairy products, such as skim or 1% milk, 2% or reduced-fat cheeses, low-fat ricotta or cottage cheese, or plain low-fat yogurt. Low-sodium or reduced-sodium cheeses.  Fats and Oils Tub margarines without trans fats. Light or reduced-fat mayonnaise and salad dressings (reduced sodium). Avocado. Safflower, olive, or canola oils. Natural peanut or almond butter.  Other Unsalted popcorn  and pretzels. The items listed above may not be a complete list of recommended foods or beverages. Contact your dietitian for more options.  +++++++++++++++  WHAT FOODS ARE NOT RECOMMENDED? Grains/ White flour or wheat flour White bread. White pasta. White rice. Refined cornbread. Bagels and croissants. Crackers that contain trans fat.  Vegetables  Creamed or fried vegetables. Vegetables in a . Regular canned vegetables. Regular canned tomato sauce and paste. Regular tomato and vegetable juices.  Fruits Dried fruits. Canned fruit in light or heavy syrup. Fruit juice.  Meat and Other Protein Products Meat in general - RED meat & White meat.  Fatty cuts of meat. Ribs, chicken wings, all processed meats as bacon, sausage, bologna, salami, fatback, hot dogs, bratwurst and packaged luncheon meats.  Dairy Whole or 2% milk, cream, half-and-half, and cream cheese. Whole-fat or sweetened yogurt. Full-fat cheeses or blue cheese. Non-dairy creamers and whipped toppings. Processed cheese, cheese spreads, or cheese curds.  Condiments Onion and garlic salt, seasoned salt, table salt, and sea salt. Canned and packaged gravies. Worcestershire sauce. Tartar sauce. Barbecue sauce. Teriyaki sauce. Soy sauce, including reduced sodium. Steak sauce. Fish sauce. Oyster sauce. Cocktail   sauce. Horseradish. Ketchup and mustard. Meat flavorings and tenderizers. Bouillon cubes. Hot sauce. Tabasco sauce. Marinades. Taco seasonings. Relishes.  Fats and Oils Butter, stick margarine, lard, shortening and bacon fat. Coconut, palm kernel, or palm oils. Regular salad dressings.  Pickles and olives. Salted popcorn and pretzels.  The items listed above may not be a complete list of foods and beverages to avoid.   

## 2017-04-16 NOTE — Progress Notes (Signed)
This very nice 82 y.o. WWF  presents for 6 month follow up with Hypertension, Hyperlipidemia, T2_NIDDM and Vitamin D Deficiency. Patient has hx/o Gout controlled with meds. Patient has GERD controlled with prudent det & meds.      Patient is treated for HTN (1991)  & BP has been controlled at home. Today's BP is at goal - 124/68. Patient has had no complaints of any cardiac type chest pain, palpitations, dyspnea/orthopnea/PND, dizziness, claudication, or dependent edema.     Hyperlipidemia is controlled with diet & meds. Patient denies myalgias or other med SE's. Last Lipids were at goal: Lab Results  Component Value Date   CHOL 158 01/14/2017   HDL 52 01/14/2017   LDLCALC 78 01/14/2017   TRIG 140 01/14/2017   CHOLHDL 3.0 01/14/2017      Also, the patient has history of T2_NIDDM (2007) & trying to control w/diet. She denies symptoms of reactive hypoglycemia, diabetic polys, paresthesias or visual blurring.  Patient had been Intolerant in the past to Metformin due to Diarrhea. She reports CBG's are less than 120 mg%. Last A1c was not at goal: Lab Results  Component Value Date   HGBA1C 6.4 (H) 01/14/2017      Further, the patient also has history of Vitamin D Deficiency ("29" in 2008) and supplements vitamin D without any suspected side-effects. Last vitamin D was low (goal 70-100):  Lab Results  Component Value Date   VD25OH 47 10/11/2016   Current Outpatient Prescriptions on File Prior to Visit  Medication Sig  . allopurinol (ZYLOPRIM) 300 MG tablet TAKE 1 TABLET BY MOUTH ONCE DAILY TO PREVENT GOUT  . aspirin EC 81 MG tablet Take 81 mg by mouth daily.  Marland Kitchen atenolol (TENORMIN) 50 MG tablet TAKE 1 TABLET (50 MG TOTAL) BY MOUTH DAILY.  . cholecalciferol (VITAMIN D) 1000 UNITS tablet Take 5,000 Units by mouth every other day.   . citalopram (CELEXA) 40 MG tablet Take 1 tablet (40 mg total) by mouth daily.  Marland Kitchen diltiazem (CARDIZEM CD) 240 MG 24 hr capsule TAKE 1 CAPSULE BY MOUTH ONCE  DAILY FOR BLOOD PRESSURE  . fish oil-omega-3 fatty acids 1000 MG capsule Take 1 g by mouth daily.  Marland Kitchen FREESTYLE LITE test strip   . indapamide (LOZOL) 1.25 MG tablet TAKE ONE TABLET BY MOUTH TWICE DAILY FOR BLOOD PRESSURE AND FLUID  . Lancets (FREESTYLE) lancets   . lisinopril (PRINIVIL,ZESTRIL) 20 MG tablet TAKE ONE TABLET BY MOUTH ONCE DAILY FOR BLOOD PRESSURE.  . pravastatin (PRAVACHOL) 40 MG tablet TAKE 1 TABLET BY MOUTH DAILY.  . ranitidine (ZANTAC) 300 MG tablet TAKE ONE TABLET BY MOUTH ONCE DAILY AS NEEDED FOR  ACID  INDIGESTION  . triamcinolone cream (KENALOG) 0.1 %    No current facility-administered medications on file prior to visit.    Allergies  Allergen Reactions  . Buspar [Buspirone]     dysphoria  . Lipitor [Atorvastatin]     Myalgias  . Loop Diuretics     Fatigue/weakness  . Metformin And Related     Gas/bloating  . Nsaids     GI upset  . Sulfa Antibiotics Other (See Comments)    Reaction=throat itching  . Sulfonamide Derivatives    PMHx:   Past Medical History:  Diagnosis Date  . A-fib ([place])   . Anxiety   . Arthritis   . Diabetes mellitus   . Gout   . Hyperlipemia   . Hypertension    Immunization History  Administered  Date(s) Administered  . Influenza Split 07/13/2014  . Influenza, High Dose Seasonal PF 04/14/2015  . Pneumococcal-Unspecified 06/07/2003  . Td 06/02/2007  . Zoster 05/30/2012   Past Surgical History:  Procedure Laterality Date  . ABDOMINAL HYSTERECTOMY    . BIOPSY BREAST     (L) BREAST IN 1993  . KNEE ARTHROSCOPY     FHx:    Reviewed / unchanged  SHx:    Reviewed / unchanged  Systems Review:  Constitutional: Denies fever, chills, wt changes, headaches, insomnia, fatigue, night sweats, change in appetite. Eyes: Denies redness, blurred vision, diplopia, discharge, itchy, watery eyes.  ENT: Denies discharge, congestion, post nasal drip, epistaxis, sore throat, earache, hearing loss, dental pain, tinnitus, vertigo, sinus pain,  snoring.  CV: Denies chest pain, palpitations, irregular heartbeat, syncope, dyspnea, diaphoresis, orthopnea, PND, claudication or edema. Respiratory: denies cough, dyspnea, DOE, pleurisy, hoarseness, laryngitis, wheezing.  Gastrointestinal: Denies dysphagia, odynophagia, heartburn, reflux, water brash, abdominal pain or cramps, nausea, vomiting, bloating, diarrhea, constipation, hematemesis, melena, hematochezia  or hemorrhoids. Genitourinary: Denies dysuria, frequency, urgency, nocturia, hesitancy, discharge, hematuria or flank pain. Musculoskeletal: Denies arthralgias, myalgias, stiffness, jt. swelling, pain, limping or strain/sprain.  Skin: Denies pruritus, rash, hives, warts, acne, eczema or change in skin lesion(s). Neuro: No weakness, tremor, incoordination, spasms, paresthesia or pain. Psychiatric: Denies confusion, memory loss or sensory loss. Endo: Denies change in weight, skin or hair change.  Heme/Lymph: No excessive bleeding, bruising or enlarged lymph nodes.  Physical Exam  BP 124/68   Pulse (!) 52   Temp (!) 97.5 F (36.4 C)   Resp 16   Ht 5\' 5"  (1.651 m)   Wt 158 lb 3.2 oz (71.8 kg)   BMI 26.33 kg/m   Appears well nourished, well groomed  and in no distress.  Eyes: PERRLA, EOMs, conjunctiva no swelling or erythema. Sinuses: No frontal/maxillary tenderness ENT/Mouth: EAC's clear, TM's nl w/o erythema, bulging. Nares clear w/o erythema, swelling, exudates. Oropharynx clear without erythema or exudates. Oral hygiene is good. Tongue normal, non obstructing. Hearing intact.  Neck: Supple. Thyroid nl. Car 2+/2+ without bruits, nodes or JVD. Chest: Respirations nl with BS clear & equal w/o rales, rhonchi, wheezing or stridor.  Cor: Heart sounds normal w/ regular rate and rhythm without sig. murmurs, gallops, clicks or rubs. Peripheral pulses normal and equal  without edema.  Abdomen: Soft & bowel sounds normal. Non-tender w/o guarding, rebound, hernias, masses or  organomegaly.  Lymphatics: Unremarkable.  Musculoskeletal: Full ROM all peripheral extremities, joint stability, 5/5 strength and normal gait.  Skin: Warm, dry without exposed rashes, lesions or ecchymosis apparent.  Neuro: Cranial nerves intact, reflexes equal bilaterally. Sensory-motor testing grossly intact. Tendon reflexes grossly intact.  Pysch: Alert & oriented x 3.  Insight and judgement nl & appropriate. No ideations.  Assessment and Plan:  1. Essential hypertension  - Continue medication, monitor blood pressure at home.  - Continue DASH diet. Reminder to go to the ER if any CP,  SOB, nausea, dizziness, severe HA, changes vision/speech.  - CBC with Differential/Platelet - BASIC METABOLIC PANEL WITH GFR - Magnesium  2. Hyperlipidemia, mixed  - Continue diet/meds, exercise,& lifestyle modifications.  - Continue monitor periodic cholesterol/liver & renal functions   - Hepatic function panel - Lipid panel  3. Type 2 diabetes mellitus with stage 2 chronic kidney disease, without long-term current use of insulin (HCC)  - Continue diet, exercise, lifestyle modifications.  - Monitor appropriate labs.  - TSH - Hemoglobin A1c - Insulin, fasting  4. Vitamin D  deficiency  - Continue supplementation.  - VITAMIN D 25 Hydroxy  5. Idiopathic gout  - Uric acid  6. Gastroesophageal reflux disease  7. Medication management  - CBC with Differential/Platelet - BASIC METABOLIC PANEL WITH GFR - Hepatic function panel - Magnesium - Lipid panel - TSH - Hemoglobin A1c - Insulin, fasting - Uric acid  8. Need for vaccination for H flu type B  - Flu vaccine HIGH DOSE PF (Fluzone High dose)       Discussed  regular exercise, BP monitoring, weight control to achieve/maintain BMI less than 25 and discussed med and SE's. Recommended labs to assess and monitor clinical status with further disposition pending results of labs. Over 30 minutes of exam, counseling, chart review was  performed.

## 2017-04-17 LAB — BASIC METABOLIC PANEL WITH GFR
BUN/Creatinine Ratio: 22 (calc) (ref 6–22)
BUN: 21 mg/dL (ref 7–25)
CALCIUM: 9.5 mg/dL (ref 8.6–10.4)
CO2: 29 mmol/L (ref 20–32)
Chloride: 99 mmol/L (ref 98–110)
Creat: 0.95 mg/dL — ABNORMAL HIGH (ref 0.60–0.88)
GFR, Est African American: 66 mL/min/{1.73_m2} (ref >=60)
GFR, Est Non African American: 57 mL/min/{1.73_m2} — ABNORMAL LOW (ref >=60)
GLUCOSE: 103 mg/dL — AB (ref 65–99)
POTASSIUM: 3.7 mmol/L (ref 3.5–5.3)
Sodium: 138 mmol/L (ref 135–146)

## 2017-04-17 LAB — VITAMIN D 25 HYDROXY (VIT D DEFICIENCY, FRACTURES): VIT D 25 HYDROXY: 47 ng/mL (ref 30–100)

## 2017-04-17 LAB — CBC WITH DIFFERENTIAL/PLATELET
BASOS ABS: 61 {cells}/uL (ref 0–200)
Basophils Relative: 0.9 %
EOS ABS: 122 {cells}/uL (ref 15–500)
Eosinophils Relative: 1.8 %
HCT: 41.8 % (ref 35.0–45.0)
Hemoglobin: 14.1 g/dL (ref 11.7–15.5)
Lymphs Abs: 2632 cells/uL (ref 850–3900)
MCH: 32.6 pg (ref 27.0–33.0)
MCHC: 33.7 g/dL (ref 32.0–36.0)
MCV: 96.5 fL (ref 80.0–100.0)
MPV: 11 fL (ref 7.5–12.5)
Monocytes Relative: 6.1 %
NEUTROS PCT: 52.5 %
Neutro Abs: 3570 cells/uL (ref 1500–7800)
Platelets: 177 10*3/uL (ref 140–400)
RBC: 4.33 10*6/uL (ref 3.80–5.10)
RDW: 12.8 % (ref 11.0–15.0)
TOTAL LYMPHOCYTE: 38.7 %
WBC mixed population: 415 cells/uL (ref 200–950)
WBC: 6.8 10*3/uL (ref 3.8–10.8)

## 2017-04-17 LAB — HEMOGLOBIN A1C
EAG (MMOL/L): 7.4 (calc)
Hgb A1c MFr Bld: 6.3 % of total Hgb — ABNORMAL HIGH (ref <5.7)
Mean Plasma Glucose: 134 (calc)

## 2017-04-17 LAB — HEPATIC FUNCTION PANEL
AG Ratio: 1.8 (calc) (ref 1.0–2.5)
ALBUMIN MSPROF: 4.4 g/dL (ref 3.6–5.1)
ALKALINE PHOSPHATASE (APISO): 43 U/L (ref 33–130)
ALT: 17 U/L (ref 6–29)
AST: 21 U/L (ref 10–35)
BILIRUBIN TOTAL: 0.7 mg/dL (ref 0.2–1.2)
Bilirubin, Direct: 0.1 mg/dL (ref 0.0–0.2)
Globulin: 2.4 g/dL (calc) (ref 1.9–3.7)
Indirect Bilirubin: 0.6 mg/dL (calc) (ref 0.2–1.2)
TOTAL PROTEIN: 6.8 g/dL (ref 6.1–8.1)

## 2017-04-17 LAB — LIPID PANEL
CHOLESTEROL: 153 mg/dL (ref <200)
HDL: 60 mg/dL (ref >50)
LDL Cholesterol (Calc): 72 mg/dL (calc)
NON-HDL CHOLESTEROL (CALC): 93 mg/dL (ref <130)
TRIGLYCERIDES: 129 mg/dL (ref <150)
Total CHOL/HDL Ratio: 2.6 (calc) (ref <5.0)

## 2017-04-17 LAB — INSULIN, FASTING: INSULIN: 4.8 u[IU]/mL (ref 2.0–19.6)

## 2017-04-17 LAB — TSH: TSH: 1.44 mIU/L (ref 0.40–4.50)

## 2017-04-17 LAB — MAGNESIUM: MAGNESIUM: 1.8 mg/dL (ref 1.5–2.5)

## 2017-04-17 LAB — URIC ACID: Uric Acid, Serum: 4.3 mg/dL (ref 2.5–7.0)

## 2017-04-24 DIAGNOSIS — E7800 Pure hypercholesterolemia, unspecified: Secondary | ICD-10-CM | POA: Diagnosis not present

## 2017-04-24 DIAGNOSIS — I10 Essential (primary) hypertension: Secondary | ICD-10-CM | POA: Diagnosis not present

## 2017-04-24 DIAGNOSIS — E1165 Type 2 diabetes mellitus with hyperglycemia: Secondary | ICD-10-CM | POA: Diagnosis not present

## 2017-04-29 ENCOUNTER — Other Ambulatory Visit: Payer: Self-pay | Admitting: Physician Assistant

## 2017-05-20 ENCOUNTER — Ambulatory Visit (INDEPENDENT_AMBULATORY_CARE_PROVIDER_SITE_OTHER): Payer: Medicare Other | Admitting: Adult Health

## 2017-05-20 ENCOUNTER — Encounter: Payer: Self-pay | Admitting: Adult Health

## 2017-05-20 VITALS — BP 104/54 | HR 59 | Temp 98.3°F | Resp 15 | Ht 65.0 in | Wt 155.4 lb

## 2017-05-20 DIAGNOSIS — J219 Acute bronchiolitis, unspecified: Secondary | ICD-10-CM | POA: Diagnosis not present

## 2017-05-20 MED ORDER — AZITHROMYCIN 250 MG PO TABS: ORAL_TABLET | ORAL | 1 refills | Status: AC

## 2017-05-20 MED ORDER — PREDNISONE 20 MG PO TABS: ORAL_TABLET | ORAL | 0 refills | Status: DC

## 2017-05-20 NOTE — Patient Instructions (Signed)

## 2017-05-20 NOTE — Progress Notes (Signed)
Assessment and Plan:  Belle was seen today for uri.  Diagnoses and all orders for this visit:  Acute bronchiolitis due to unspecified organism -     Does not appear to have progressed to pneumonia, but will treat to prevent progression due to age and previous history of pneumonia -     predniSONE (DELTASONE) 20 MG tablet; 2 tablets daily for 3 days, 1 tablet daily for 4 days. -     azithromycin (ZITHROMAX) 250 MG tablet; Take 2 tablets (500 mg) on  Day 1,  followed by 1 tablet (250 mg) once daily on Days 2 through 5.  Discussed obtaining a CXR; declines at this time. Patient also declines albuterol today- she will call back if she changes her mind.  Suggested symptomatic OTC remedies. Nasal saline spray for congestion. Follow up as needed.  Go to the ER if any chest pain, shortness of breath, nausea, dizziness, severe HA, changes vision/speech  Further disposition pending results of labs. Discussed med's effects and SE's.   Over 15 minutes of exam, counseling, chart review, and critical decision making was performed.   Future Appointments Date Time Provider [place]  07/19/2017 10:30 AM Liane Comber, NP [facility] None  11/12/2017 3:00 PM Unk Pinto, MD [facility] None    ------------------------------------------------------------------------------------------------------------------   HPI BP (!) 104/54   Pulse (!) 59   Temp 98.3 F (36.8 C)   Resp 15   Ht 5\' 5"  (1.651 m)   Wt 155 lb 6.4 oz (70.5 kg)   SpO2 97%   BMI 25.86 kg/m   82 y.o.female presents for sore throat, cough and chest congestion x 4 days. She reports she can "hear a rattling in my chest" and also endorses wheezing when she is lying down. She has tried claritin, mucinex, aspirin when the symptoms first began which seemed to clear up the nasal symptoms which now seem to have moved to the chest; she reports the cough is mostly dry, but occasional white mucus. She endorses fatigue and has  not felt like getting out and about. Denies fever/chills, CP, SOB, rashes, N/V. She does endorse some mild diarrhea today. She reports mucus and cough is worse in the AM. Sore throat has mostly subsided.   Distant history of smoking 30+ years ago. No recent travel or known sick contacts. She reports she has had 1 episode of pneumonia in recent years that responded well to azithromycin treatment.   Past Medical History:  Diagnosis Date  . A-fib ([place])   . Anxiety   . Arthritis   . Diabetes mellitus   . Gout   . Hyperlipemia   . Hypertension      Allergies  Allergen Reactions  . Buspar [Buspirone]     dysphoria  . Lipitor [Atorvastatin]     Myalgias  . Loop Diuretics     Fatigue/weakness  . Metformin And Related     Gas/bloating  . Nsaids     GI upset  . Sulfa Antibiotics Other (See Comments)    Reaction=throat itching  . Sulfonamide Derivatives     Current Outpatient Prescriptions on File Prior to Visit  Medication Sig  . allopurinol (ZYLOPRIM) 300 MG tablet TAKE 1 TABLET BY MOUTH ONCE DAILY TO PREVENT GOUT  . aspirin EC 81 MG tablet Take 81 mg by mouth daily.  Marland Kitchen atenolol (TENORMIN) 50 MG tablet TAKE 1 TABLET (50 MG TOTAL) BY MOUTH DAILY.  . cholecalciferol (VITAMIN D) 1000 UNITS tablet Take 5,000 Units by mouth every other  day.   . citalopram (CELEXA) 40 MG tablet Take 1 tablet (40 mg total) by mouth daily.  Marland Kitchen diltiazem (CARDIZEM CD) 240 MG 24 hr capsule TAKE 1 CAPSULE BY MOUTH ONCE DAILY FOR BLOOD PRESSURE  . fish oil-omega-3 fatty acids 1000 MG capsule Take 1 g by mouth daily.  Marland Kitchen FREESTYLE LITE test strip   . indapamide (LOZOL) 1.25 MG tablet TAKE ONE TABLET BY MOUTH TWICE DAILY FOR BLOOD PRESSURE AND FLUID  . Lancets (FREESTYLE) lancets   . lisinopril (PRINIVIL,ZESTRIL) 20 MG tablet TAKE ONE TABLET BY MOUTH ONCE DAILY FOR BLOOD PRESSURE.  . pravastatin (PRAVACHOL) 40 MG tablet TAKE 1 TABLET BY MOUTH DAILY.  . ranitidine (ZANTAC) 300 MG tablet TAKE ONE TABLET BY MOUTH  ONCE DAILY AS NEEDED FOR  ACID  INDIGESTION  . triamcinolone cream (KENALOG) 0.1 %    No current facility-administered medications on file prior to visit.     ROS: all negative except above.   Physical Exam:  BP (!) 104/54   Pulse (!) 59   Temp 98.3 F (36.8 C)   Resp 15   Ht 5\' 5"  (1.651 m)   Wt 155 lb 6.4 oz (70.5 kg)   SpO2 97%   BMI 25.86 kg/m   General Appearance: Well nourished, in no apparent distress. Eyes: PERRLA, EOMs, conjunctiva no swelling or erythema Sinuses: No Frontal/maxillary tenderness ENT/Mouth: Ext aud canals clear, TMs without erythema, bulging, fluid effusion on R. Posterior pharynx mildly injected, no swelling, or exudate.  Tonsils not swollen or erythematous. Hearing normal.  Neck: Supple.  Respiratory: Respiratory effort normal, BS equal bilaterally with coarse crackles in bilateral lower lobes, coarse bronchial sounds heard throughout posterior and anterior without wheezing or stridor.  Cardio: RRR with no MRGs. Brisk peripheral pulses without edema.  Abdomen: Soft, + BS.  Non tender, no guarding, rebound, hernias, masses. Lymphatics: Non tender without lymphadenopathy.  Musculoskeletal:  normal gait.  Skin: Warm, dry without rashes, lesions, ecchymosis.  Neuro: Cranial nerves intact. Normal muscle tone, no cerebellar symptoms. Sensation intact.  Psych: Awake and oriented X 3, normal affect, Insight and Judgment appropriate.     Izora Ribas, NP 4:12 PM [facility]

## 2017-06-17 DIAGNOSIS — E119 Type 2 diabetes mellitus without complications: Secondary | ICD-10-CM | POA: Diagnosis not present

## 2017-06-17 DIAGNOSIS — H5203 Hypermetropia, bilateral: Secondary | ICD-10-CM | POA: Diagnosis not present

## 2017-06-17 DIAGNOSIS — H11159 Pinguecula, unspecified eye: Secondary | ICD-10-CM | POA: Diagnosis not present

## 2017-06-17 DIAGNOSIS — I10 Essential (primary) hypertension: Secondary | ICD-10-CM | POA: Diagnosis not present

## 2017-06-17 DIAGNOSIS — H52223 Regular astigmatism, bilateral: Secondary | ICD-10-CM | POA: Diagnosis not present

## 2017-06-17 DIAGNOSIS — H25099 Other age-related incipient cataract, unspecified eye: Secondary | ICD-10-CM | POA: Diagnosis not present

## 2017-06-17 DIAGNOSIS — H524 Presbyopia: Secondary | ICD-10-CM | POA: Diagnosis not present

## 2017-06-17 LAB — HM DIABETES EYE EXAM

## 2017-07-01 ENCOUNTER — Other Ambulatory Visit: Payer: Self-pay | Admitting: Internal Medicine

## 2017-07-02 DIAGNOSIS — E119 Type 2 diabetes mellitus without complications: Secondary | ICD-10-CM | POA: Diagnosis not present

## 2017-07-11 ENCOUNTER — Other Ambulatory Visit: Payer: Self-pay | Admitting: *Deleted

## 2017-07-11 MED ORDER — CITALOPRAM HYDROBROMIDE 10 MG PO TABS: 10.0000 mg | ORAL_TABLET | Freq: Every day | ORAL | 3 refills | Status: DC

## 2017-07-18 NOTE — Progress Notes (Signed)
FOLLOW UP  Assessment and Plan:   A. Fib NSR today; rate controlled on cardizem without anticoagulant; risks have been discussed with patient- she has declined therapy Emphasized to present to ER for new symptoms   Hypertension Well controlled with current medications Monitor blood pressure at home; patient to call if consistently greater than 130/80 Continue DASH diet.   Reminder to go to the ER if any CP, SOB, nausea, dizziness, severe HA, changes vision/speech, left arm numbness and tingling and jaw pain.  Cholesterol Continue medication: prevastatin Continue low cholesterol diet and exercise.  Check lipid panel.   Prediabetes Disease risks discussed Continue diet and exercise.  Perform daily foot/skin check, notify office of any concerning changes.  Check A1C  Overweight She has been losing weight steadily and is nearly within goal range but has gained weight since last visit - has been eating more sweets - will cut back down and cut sugar out again Recommended diet heavy in fruits and veggies and low in animal meats, cheeses, and dairy products, appropriate calorie intake Discussed appropriate weight for height Follow up in 3 months  GERD Well managed on current medications Discussed diet, avoiding triggers and other lifestyle changes  Vitamin D Def/ osteoporosis prevention Continue supplementation Check Vit D level  Continue diet and meds as discussed. Further disposition pending results of labs. Discussed med's effects and SE's.   Over 30 minutes of exam, counseling, chart review, and critical decision making was performed.   Future Appointments  Date Time Provider [place]  11/12/2017  3:00 PM Unk Pinto, MD [facility] None    ----------------------------------------------------------------------------------------------------------------------  HPI 82 y.o. female  presents for 3 month follow up on a. Fib, hypertension, cholesterol,  prediabetes, GERD, gout, weight and vitamin D deficiency. She also has gout well controlled on allopurinol with last uric acid at goal. She has a diagnosis of a. Fib rate controlled by cardizem without episode in over 2 years; CHADSVASC score of 5 and risks have been discussed with the patient in the past; she has declined anticoagulant therapy, and has been instructed to present to ER for new symptoms. She is followed by Dr. Doretha Imus for diabetes; she has been doing well and is back in prediabetic range - off of medications now. Seen annually.   BMI is Body mass index is 27.09 kg/m., she has been working on diet and exercise, but reports she has been splitting a dessert with her sweetheart she is dating the last year. She will stop having dessert so often.  Wt Readings from Last 3 Encounters:  07/19/17 162 lb 12.8 oz (73.8 kg)  05/20/17 155 lb 6.4 oz (70.5 kg)  04/16/17 158 lb 3.2 oz (71.8 kg)   she has a diagnosis of GERD which is currently managed by ranitidine 300 mg daily she reports symptoms is currently well controlled, and denies breakthrough reflux, burning in chest, hoarseness or cough.    Her blood pressure has been controlled at home, today their BP is BP: 124/86  She does workout. She denies chest pain, shortness of breath, dizziness.   She is on cholesterol medication and denies myalgias. Her cholesterol is at goal. The cholesterol last visit was:   Lab Results  Component Value Date   CHOL 153 04/16/2017   HDL 60 04/16/2017   LDLCALC 78 01/14/2017   TRIG 129 04/16/2017   CHOLHDL 2.6 04/16/2017    She has been working on diet and exercise for prediabetes, and denies increased appetite, nausea, paresthesia of the  feet, polydipsia, polyuria, visual disturbances and vomiting. Last A1C in the office was:  Lab Results  Component Value Date   HGBA1C 6.3 (H) 04/16/2017   Patient is on Vitamin D supplement but remains below goal of 70 at the last visit:    Lab Results  Component  Value Date   VD25OH 47 04/16/2017      Current Medications:  Current Outpatient Medications on File Prior to Visit  Medication Sig  . allopurinol (ZYLOPRIM) 300 MG tablet TAKE 1 TABLET BY MOUTH ONCE DAILY TO PREVENT GOUT  . aspirin EC 81 MG tablet Take 81 mg by mouth daily.  Marland Kitchen atenolol (TENORMIN) 50 MG tablet TAKE 1 TABLET (50 MG TOTAL) BY MOUTH DAILY.  . cholecalciferol (VITAMIN D) 1000 UNITS tablet Take 5,000 Units by mouth every other day.   . citalopram (CELEXA) 10 MG tablet Take 1 tablet (10 mg total) by mouth daily.  Marland Kitchen diltiazem (CARDIZEM CD) 240 MG 24 hr capsule TAKE 1 CAPSULE BY MOUTH ONCE DAILY FOR BLOOD PRESSURE  . fish oil-omega-3 fatty acids 1000 MG capsule Take 1 g by mouth daily.  Marland Kitchen FREESTYLE LITE test strip   . indapamide (LOZOL) 1.25 MG tablet TAKE ONE TABLET BY MOUTH TWICE DAILY FOR BLOOD PRESSURE AND FLUID  . Lancets (FREESTYLE) lancets   . lisinopril (PRINIVIL,ZESTRIL) 20 MG tablet TAKE ONE TABLET BY MOUTH ONCE DAILY FOR BLOOD PRESSURE.  . pravastatin (PRAVACHOL) 40 MG tablet TAKE 1 TABLET BY MOUTH DAILY.  . ranitidine (ZANTAC) 300 MG tablet TAKE ONE TABLET BY MOUTH ONCE DAILY AS NEEDED FOR  ACID  INDIGESTION  . triamcinolone cream (KENALOG) 0.1 %    No current facility-administered medications on file prior to visit.      Allergies:  Allergies  Allergen Reactions  . Buspar [Buspirone]     dysphoria  . Lipitor [Atorvastatin]     Myalgias  . Loop Diuretics     Fatigue/weakness  . Metformin And Related     Gas/bloating  . Nsaids     GI upset  . Sulfa Antibiotics Other (See Comments)    Reaction=throat itching  . Sulfonamide Derivatives      Medical History:  Past Medical History:  Diagnosis Date  . A-fib ([place])   . Anxiety   . Arthritis   . Diabetes mellitus   . Gout   . Hyperlipemia   . Hypertension    Family history- Reviewed and unchanged Social history- Reviewed and unchanged   Review of Systems:  Review of Systems  Constitutional:  Negative for malaise/fatigue and weight loss.  HENT: Negative for hearing loss and tinnitus.   Eyes: Negative for blurred vision and double vision.  Respiratory: Negative for cough, shortness of breath and wheezing.   Cardiovascular: Negative for chest pain, palpitations, orthopnea, claudication and leg swelling.  Gastrointestinal: Negative for abdominal pain, blood in stool, constipation, diarrhea, heartburn, melena, nausea and vomiting.  Genitourinary: Negative.   Musculoskeletal: Negative for joint pain and myalgias.  Skin: Negative for rash.  Neurological: Negative for dizziness, tingling, sensory change, weakness and headaches.  Endo/Heme/Allergies: Negative for polydipsia.  Psychiatric/Behavioral: Negative.   All other systems reviewed and are negative.   Physical Exam: BP 124/86   Pulse 65   Temp 97.6 F (36.4 C)   Resp 14   Ht 5\' 5"  (1.651 m)   Wt 162 lb 12.8 oz (73.8 kg)   SpO2 97%   BMI 27.09 kg/m  Wt Readings from Last 3 Encounters:  07/19/17 162 lb  12.8 oz (73.8 kg)  05/20/17 155 lb 6.4 oz (70.5 kg)  04/16/17 158 lb 3.2 oz (71.8 kg)   General Appearance: Well nourished, in no apparent distress. Eyes: PERRLA, EOMs, conjunctiva no swelling or erythema Sinuses: No Frontal/maxillary tenderness ENT/Mouth: Ext aud canals clear, TMs without erythema, bulging. No erythema, swelling, or exudate on post pharynx.  Tonsils not swollen or erythematous. Hearing normal.  Neck: Supple, thyroid normal.  Respiratory: Respiratory effort normal, BS equal bilaterally without rales, rhonchi, wheezing or stridor.  Cardio: RRR with no MRGs. Brisk peripheral pulses without edema.  Abdomen: Soft, + BS.  Non tender, no guarding, rebound, hernias, masses. Lymphatics: Non tender without lymphadenopathy.  Musculoskeletal: Full ROM, 5/5 strength, Normal gait Skin: Warm, dry without rashes, lesions, ecchymosis.  Neuro: Cranial nerves intact. No cerebellar symptoms.  Psych: Awake and oriented X  3, normal affect, Insight and Judgment appropriate.    Izora Ribas, NP 10:35 AM [facility]

## 2017-07-19 ENCOUNTER — Ambulatory Visit (INDEPENDENT_AMBULATORY_CARE_PROVIDER_SITE_OTHER): Payer: Medicare Other | Admitting: Adult Health

## 2017-07-19 ENCOUNTER — Encounter: Payer: Self-pay | Admitting: Adult Health

## 2017-07-19 VITALS — BP 124/86 | HR 65 | Temp 97.6°F | Resp 14 | Ht 65.0 in | Wt 162.8 lb

## 2017-07-19 DIAGNOSIS — K219 Gastro-esophageal reflux disease without esophagitis: Secondary | ICD-10-CM | POA: Diagnosis not present

## 2017-07-19 DIAGNOSIS — M1000 Idiopathic gout, unspecified site: Secondary | ICD-10-CM

## 2017-07-19 DIAGNOSIS — Z79899 Other long term (current) drug therapy: Secondary | ICD-10-CM

## 2017-07-19 DIAGNOSIS — E1122 Type 2 diabetes mellitus with diabetic chronic kidney disease: Secondary | ICD-10-CM

## 2017-07-19 DIAGNOSIS — E559 Vitamin D deficiency, unspecified: Secondary | ICD-10-CM

## 2017-07-19 DIAGNOSIS — I480 Paroxysmal atrial fibrillation: Secondary | ICD-10-CM | POA: Diagnosis not present

## 2017-07-19 DIAGNOSIS — N182 Chronic kidney disease, stage 2 (mild): Secondary | ICD-10-CM

## 2017-07-19 DIAGNOSIS — E782 Mixed hyperlipidemia: Secondary | ICD-10-CM | POA: Diagnosis not present

## 2017-07-19 DIAGNOSIS — I10 Essential (primary) hypertension: Secondary | ICD-10-CM | POA: Diagnosis not present

## 2017-07-19 MED ORDER — ALLOPURINOL 300 MG PO TABS: 150.0000 mg | ORAL_TABLET | Freq: Every day | ORAL | 1 refills | Status: DC

## 2017-07-20 LAB — HEPATIC FUNCTION PANEL
AG Ratio: 1.7 (calc) (ref 1.0–2.5)
ALKALINE PHOSPHATASE (APISO): 35 U/L (ref 33–130)
ALT: 16 U/L (ref 6–29)
AST: 20 U/L (ref 10–35)
Albumin: 4.1 g/dL (ref 3.6–5.1)
BILIRUBIN INDIRECT: 0.6 mg/dL (ref 0.2–1.2)
Bilirubin, Direct: 0.1 mg/dL (ref 0.0–0.2)
Globulin: 2.4 g/dL (calc) (ref 1.9–3.7)
TOTAL PROTEIN: 6.5 g/dL (ref 6.1–8.1)
Total Bilirubin: 0.7 mg/dL (ref 0.2–1.2)

## 2017-07-20 LAB — CBC WITH DIFFERENTIAL/PLATELET
BASOS ABS: 60 {cells}/uL (ref 0–200)
Basophils Relative: 1 %
EOS ABS: 90 {cells}/uL (ref 15–500)
Eosinophils Relative: 1.5 %
HCT: 38.7 % (ref 35.0–45.0)
Hemoglobin: 13.4 g/dL (ref 11.7–15.5)
Lymphs Abs: 2490 cells/uL (ref 850–3900)
MCH: 33.3 pg — AB (ref 27.0–33.0)
MCHC: 34.6 g/dL (ref 32.0–36.0)
MCV: 96 fL (ref 80.0–100.0)
MONOS PCT: 6.5 %
MPV: 10.9 fL (ref 7.5–12.5)
Neutro Abs: 2970 cells/uL (ref 1500–7800)
Neutrophils Relative %: 49.5 %
PLATELETS: 180 10*3/uL (ref 140–400)
RBC: 4.03 10*6/uL (ref 3.80–5.10)
RDW: 12.7 % (ref 11.0–15.0)
TOTAL LYMPHOCYTE: 41.5 %
WBC mixed population: 390 cells/uL (ref 200–950)
WBC: 6 10*3/uL (ref 3.8–10.8)

## 2017-07-20 LAB — BASIC METABOLIC PANEL WITH GFR
BUN / CREAT RATIO: 27 (calc) — AB (ref 6–22)
BUN: 25 mg/dL (ref 7–25)
CHLORIDE: 101 mmol/L (ref 98–110)
CO2: 29 mmol/L (ref 20–32)
Calcium: 9.6 mg/dL (ref 8.6–10.4)
Creat: 0.94 mg/dL — ABNORMAL HIGH (ref 0.60–0.88)
GFR, Est African American: 66 mL/min/{1.73_m2} (ref >=60)
GFR, Est Non African American: 57 mL/min/{1.73_m2} — ABNORMAL LOW (ref >=60)
GLUCOSE: 105 mg/dL — AB (ref 65–99)
Potassium: 3.8 mmol/L (ref 3.5–5.3)
SODIUM: 140 mmol/L (ref 135–146)

## 2017-07-20 LAB — LIPID PANEL
CHOLESTEROL: 172 mg/dL (ref <200)
HDL: 60 mg/dL (ref >50)
LDL Cholesterol (Calc): 93 mg/dL (calc)
NON-HDL CHOLESTEROL (CALC): 112 mg/dL (ref <130)
Total CHOL/HDL Ratio: 2.9 (calc) (ref <5.0)
Triglycerides: 101 mg/dL (ref <150)

## 2017-07-20 LAB — TSH: TSH: 1.56 mIU/L (ref 0.40–4.50)

## 2017-07-20 LAB — HEMOGLOBIN A1C
EAG (MMOL/L): 7.7 (calc)
HEMOGLOBIN A1C: 6.5 %{Hb} — AB (ref <5.7)
MEAN PLASMA GLUCOSE: 140 (calc)

## 2017-08-28 ENCOUNTER — Encounter: Payer: Self-pay | Admitting: Adult Health

## 2017-08-28 ENCOUNTER — Ambulatory Visit (INDEPENDENT_AMBULATORY_CARE_PROVIDER_SITE_OTHER): Payer: Medicare Other | Admitting: Adult Health

## 2017-08-28 VITALS — BP 118/58 | HR 58 | Temp 97.9°F | Ht 65.0 in | Wt 157.4 lb

## 2017-08-28 DIAGNOSIS — J00 Acute nasopharyngitis [common cold]: Secondary | ICD-10-CM | POA: Diagnosis not present

## 2017-08-28 MED ORDER — PREDNISONE 20 MG PO TABS: ORAL_TABLET | ORAL | 0 refills | Status: DC

## 2017-08-28 MED ORDER — AZITHROMYCIN 250 MG PO TABS: ORAL_TABLET | ORAL | 1 refills | Status: AC

## 2017-08-28 MED ORDER — PROMETHAZINE-DM 6.25-15 MG/5ML PO SYRP: 5.0000 mL | ORAL_SOLUTION | Freq: Four times a day (QID) | ORAL | 1 refills | Status: DC | PRN

## 2017-08-28 MED ORDER — CITALOPRAM HYDROBROMIDE 20 MG PO TABS: 20.0000 mg | ORAL_TABLET | Freq: Every day | ORAL | 2 refills | Status: DC

## 2017-08-28 NOTE — Patient Instructions (Signed)
Can continue with mucinex or guaifenesin -   Can do nasal irrigation for congestion, flonase (available over the counter)    HOW TO TREAT VIRAL COUGH AND COLD SYMPTOMS:  -Symptoms usually last at least 1 week with the worst symptoms being around day 4.  - colds usually start with a sore throat and end with a cough, and the cough can take 2 weeks to get better.  -No antibiotics are needed for colds, flu, sore throats, cough, bronchitis UNLESS symptoms are longer than 7 days OR if you are getting better then get drastically worse.  -There are a lot of combination medications (Dayquil, Nyquil, Vicks 44, tyelnol cold and sinus, ETC). Please look at the ingredients on the back so that you are treating the correct symptoms and not doubling up on medications/ingredients.    Medicines you can use  Nasal congestion  Little Remedies saline spray (aerosol/mist)- can try this, it is in the kids section - pseudoephedrine (Sudafed)- behind the counter, do not use if you have high blood pressure, medicine that have -D in them.  - phenylephrine (Sudafed PE) -Dextormethorphan + chlorpheniramine (Coridcidin HBP)- okay if you have high blood pressure -Oxymetazoline (Afrin) nasal spray- LIMIT to 3 days -Saline nasal spray -Neti pot (used distilled or bottled water)  Ear pain/congestion  -pseudoephedrine (sudafed) - Nasonex/flonase nasal spray  Fever  -Acetaminophen (Tyelnol) -Ibuprofen (Advil, motrin, aleve)  Sore Throat  -Acetaminophen (Tyelnol) -Ibuprofen (Advil, motrin, aleve) -Drink a lot of water -Gargle with salt water - Rest your voice (don't talk) -Throat sprays -Cough drops  Body Aches  -Acetaminophen (Tyelnol) -Ibuprofen (Advil, motrin, aleve)  Headache  -Acetaminophen (Tyelnol) -Ibuprofen (Advil, motrin, aleve) - Exedrin, Exedrin Migraine  Allergy symptoms (cough, sneeze, runny nose, itchy eyes) -Claritin or loratadine cheapest but likely the weakest  -Zyrtec or certizine  at night because it can make you sleepy -The strongest is allegra or fexafinadine  Cheapest at [employer], [employer], [employer]  Cough  -Dextromethorphan (Delsym)- medicine that has DM in it -Guafenesin (Mucinex/Robitussin) - cough drops - drink lots of water  Chest Congestion  -Guafenesin (Mucinex/Robitussin)  Red Itchy Eyes  - Naphcon-A  Upset Stomach  - Bland diet (nothing spicy, greasy, fried, and high acid foods like tomatoes, oranges, berries) -OKAY- cereal, bread, soup, crackers, rice -Eat smaller more frequent meals -reduce caffeine, no alcohol -Loperamide (Imodium-AD) if diarrhea -Prevacid for heart burn  General health when sick  -Hydration -wash your hands frequently -keep surfaces clean -change pillow cases and sheets often -Get fresh air but do not exercise strenuously -Vitamin D, double up on it - Vitamin C -Zinc

## 2017-08-28 NOTE — Progress Notes (Signed)
Assessment and Plan:  Ruth Delgado was seen today for uri.  Diagnoses and all orders for this visit:  Acute nasopharyngitis (common cold) Benign exam- Discussed the importance of avoiding unnecessary antibiotic therapy. Likely post-viral syndrome - discussed nagging cough may last several weeks - contact office if ongoing after 2 weeks, may follow up with CXR for hx of smoking Suggested symptomatic OTC remedies. Nasal saline spray for congestion. Nasal steroids, allergy pill, oral steroids Follow up as needed. -     promethazine-dextromethorphan (PROMETHAZINE-DM) 6.25-15 MG/5ML syrup; Take 5 mLs by mouth 4 (four) times daily as needed for cough.  Other orders - to be filled if symptoms do not improve in 2-3 days or significantly worsen/develop a fever -     azithromycin (ZITHROMAX) 250 MG tablet; Take 2 tablets (500 mg) on  Day 1,  followed by 1 tablet (250 mg) once daily on Days 2 through 5. -     predniSONE (DELTASONE) 20 MG tablet; 2 tablets daily for 3 days, 1 tablet daily for 4 days.  Go to the ER if any chest pain, shortness of breath, nausea, dizziness, severe HA, changes vision/speech or other sudden severe symptoms  Further disposition pending results of labs. Discussed med's effects and SE's.   Over 15 minutes of exam, counseling, chart review, and critical decision making was performed.   Future Appointments  Date Time Provider [place]  11/12/2017  3:00 PM Unk Pinto, MD [facility] None    ------------------------------------------------------------------------------------------------------------------   HPI BP (!) 118/58   Pulse (!) 58   Temp 97.9 F (36.6 C)   Ht 5\' 5"  (1.651 m)   Wt 157 lb 6.4 oz (71.4 kg)   SpO2 97%   BMI 26.19 kg/m   83 y.o.female presents for URI symptoms - chest congestion, productive cough (small amounts of thick white mucus) x 1 week that improved over this past weekend that came back after she worked on her yard and now is  worse. She is now having some soreness in her chest with coughing. No temps at home - 96.4-96.7 -  She endorses itching ears and crackles. Denies fevers/chills, but remarks she has woken up sweating at night for the last few days. Denies chest pain, dyspnea, rash, HA, N/V/abdominal pain.   She has some leftover codeine-guaifenesin cough syrup and also has been alternating with mucinex.   Past Medical History:  Diagnosis Date  . A-fib ([place])   . Anxiety   . Arthritis   . Diabetes mellitus   . Gout   . Hyperlipemia   . Hypertension      Allergies  Allergen Reactions  . Buspar [Buspirone]     dysphoria  . Lipitor [Atorvastatin]     Myalgias  . Loop Diuretics     Fatigue/weakness  . Metformin And Related     Gas/bloating  . Nsaids     GI upset  . Sulfa Antibiotics Other (See Comments)    Reaction=throat itching  . Sulfonamide Derivatives     Current Outpatient Medications on File Prior to Visit  Medication Sig  . allopurinol (ZYLOPRIM) 300 MG tablet Take 0.5 tablets (150 mg total) by mouth daily.  Marland Kitchen aspirin EC 81 MG tablet Take 81 mg by mouth daily.  Marland Kitchen atenolol (TENORMIN) 50 MG tablet TAKE 1 TABLET (50 MG TOTAL) BY MOUTH DAILY.  . cholecalciferol (VITAMIN D) 1000 UNITS tablet Take 5,000 Units by mouth every other day.   . diltiazem (CARDIZEM CD) 240 MG 24 hr capsule TAKE 1  CAPSULE BY MOUTH ONCE DAILY FOR BLOOD PRESSURE  . fish oil-omega-3 fatty acids 1000 MG capsule Take 1 g by mouth daily.  Marland Kitchen FREESTYLE LITE test strip   . indapamide (LOZOL) 1.25 MG tablet TAKE ONE TABLET BY MOUTH TWICE DAILY FOR BLOOD PRESSURE AND FLUID  . Lancets (FREESTYLE) lancets   . lisinopril (PRINIVIL,ZESTRIL) 20 MG tablet TAKE ONE TABLET BY MOUTH ONCE DAILY FOR BLOOD PRESSURE.  . pravastatin (PRAVACHOL) 40 MG tablet TAKE 1 TABLET BY MOUTH DAILY.  . ranitidine (ZANTAC) 300 MG tablet TAKE ONE TABLET BY MOUTH ONCE DAILY AS NEEDED FOR  ACID  INDIGESTION  . triamcinolone cream (KENALOG) 0.1 %   .  [DISCONTINUED] citalopram (CELEXA) 10 MG tablet Take 1 tablet (10 mg total) by mouth daily.   No current facility-administered medications on file prior to visit.     ROS: Review of Systems  Constitutional: Negative for chills, diaphoresis, fever, malaise/fatigue and weight loss.  HENT: Positive for congestion. Negative for ear discharge, ear pain, hearing loss, sinus pain, sore throat and tinnitus.   Eyes: Negative for blurred vision, pain, discharge and redness.  Respiratory: Positive for cough. Negative for hemoptysis, sputum production, shortness of breath, wheezing and stridor.   Cardiovascular: Negative for chest pain, palpitations and orthopnea.  Gastrointestinal: Negative for abdominal pain, diarrhea, nausea and vomiting.  Genitourinary: Negative.   Musculoskeletal: Negative for joint pain and myalgias.  Skin: Negative for rash.  Neurological: Negative for dizziness, sensory change, weakness and headaches.  Endo/Heme/Allergies: Negative for environmental allergies.  Psychiatric/Behavioral: Negative.   All other systems reviewed and are negative.   Physical Exam:  BP (!) 118/58   Pulse (!) 58   Temp 97.9 F (36.6 C)   Ht 5\' 5"  (1.651 m)   Wt 157 lb 6.4 oz (71.4 kg)   SpO2 97%   BMI 26.19 kg/m   General Appearance: Well nourished, in no apparent distress. Eyes: PERRLA, EOMs, conjunctiva no swelling or erythema Sinuses: No Frontal/maxillary tenderness ENT/Mouth: Ext aud canals clear, TMs without erythema, bulging. No erythema, swelling, or exudate on post pharynx.  Tonsils not swollen or erythematous. Hearing normal.  Neck: Supple, thyroid normal.  Respiratory: Respiratory effort normal, BS equal bilaterally without rales, rhonchi, wheezing or stridor.  Cardio: RRR with no MRGs. Brisk peripheral pulses without edema.  Abdomen: Soft, + BS.  Non tender, no guarding, rebound, hernias, masses. Lymphatics: Non tender without lymphadenopathy.  Musculoskeletal: Full ROM, 5/5  strength, normal gait.  Skin: Warm, dry without rashes, lesions, ecchymosis.  Neuro: Cranial nerves intact. Normal muscle tone, no cerebellar symptoms. Sensation intact.  Psych: Awake and oriented X 3, normal affect, Insight and Judgment appropriate.     Izora Ribas, NP 4:44 PM [facility]

## 2017-10-04 ENCOUNTER — Other Ambulatory Visit: Payer: Self-pay | Admitting: Internal Medicine

## 2017-10-07 ENCOUNTER — Other Ambulatory Visit: Payer: Self-pay | Admitting: Internal Medicine

## 2017-10-24 ENCOUNTER — Other Ambulatory Visit: Payer: Self-pay | Admitting: Physician Assistant

## 2017-11-07 ENCOUNTER — Other Ambulatory Visit: Payer: Self-pay | Admitting: Internal Medicine

## 2017-11-12 ENCOUNTER — Ambulatory Visit (INDEPENDENT_AMBULATORY_CARE_PROVIDER_SITE_OTHER): Payer: Medicare Other | Admitting: Internal Medicine

## 2017-11-12 ENCOUNTER — Encounter: Payer: Self-pay | Admitting: Internal Medicine

## 2017-11-12 VITALS — BP 124/56 | HR 60 | Temp 97.3°F | Resp 18 | Ht 64.5 in | Wt 163.0 lb

## 2017-11-12 DIAGNOSIS — K219 Gastro-esophageal reflux disease without esophagitis: Secondary | ICD-10-CM

## 2017-11-12 DIAGNOSIS — E782 Mixed hyperlipidemia: Secondary | ICD-10-CM

## 2017-11-12 DIAGNOSIS — Z23 Encounter for immunization: Secondary | ICD-10-CM

## 2017-11-12 DIAGNOSIS — M1000 Idiopathic gout, unspecified site: Secondary | ICD-10-CM | POA: Diagnosis not present

## 2017-11-12 DIAGNOSIS — N182 Chronic kidney disease, stage 2 (mild): Secondary | ICD-10-CM

## 2017-11-12 DIAGNOSIS — I10 Essential (primary) hypertension: Secondary | ICD-10-CM

## 2017-11-12 DIAGNOSIS — E559 Vitamin D deficiency, unspecified: Secondary | ICD-10-CM | POA: Diagnosis not present

## 2017-11-12 DIAGNOSIS — Z1212 Encounter for screening for malignant neoplasm of rectum: Secondary | ICD-10-CM

## 2017-11-12 DIAGNOSIS — Z1211 Encounter for screening for malignant neoplasm of colon: Secondary | ICD-10-CM

## 2017-11-12 DIAGNOSIS — Z79899 Other long term (current) drug therapy: Secondary | ICD-10-CM

## 2017-11-12 DIAGNOSIS — Z136 Encounter for screening for cardiovascular disorders: Secondary | ICD-10-CM

## 2017-11-12 DIAGNOSIS — I480 Paroxysmal atrial fibrillation: Secondary | ICD-10-CM

## 2017-11-12 DIAGNOSIS — E1122 Type 2 diabetes mellitus with diabetic chronic kidney disease: Secondary | ICD-10-CM

## 2017-11-12 LAB — HM DIABETES EYE EXAM

## 2017-11-12 NOTE — Patient Instructions (Signed)
We Do NOT Approve of  [facility], [employer]  Our Patients  To Do Home Visits & We Do NOT Approve of [facility] > > > > > > > > > > > > > > > > > > > > > > > > > > > > > > > > > > > > > > >  Preventive Care for Adults  A healthy lifestyle and preventive care can promote health and wellness. Preventive health guidelines for women include the following key practices.  A routine yearly physical is a good way to check with your health care provider about your health and preventive screening. It is a chance to share any concerns and updates on your health and to receive a thorough exam.  Visit your dentist for a routine exam and preventive care every 6 months. Brush your teeth twice a day and floss once a day. Good oral hygiene prevents tooth decay and gum disease.  The frequency of eye exams is based on your age, health, family medical history, use of contact lenses, and other factors. Follow your health care provider's recommendations for frequency of eye exams.  Eat a healthy diet. Foods like vegetables, fruits, whole grains, low-fat dairy products, and lean protein foods contain the nutrients you need without too many calories. Decrease your intake of foods high in solid fats, added sugars, and salt. Eat the right amount of calories for you. Get information about a proper diet from your health care provider, if necessary.  Regular physical exercise is one of the most important things you can do for your health. Most adults should get at least 150 minutes of moderate-intensity exercise (any activity that increases your heart rate and causes you to sweat) each week. In addition, most adults need muscle-strengthening exercises on 2 or more days a week.  Maintain a healthy weight. The body mass index (BMI) is a screening tool to identify possible weight problems. It provides an estimate of body fat based on height and weight. Your health care provider can find your BMI  and can help you achieve or maintain a healthy weight. For adults 20 years and older:  A BMI below 18.5 is considered underweight.  A BMI of 18.5 to 24.9 is normal.  A BMI of 25 to 29.9 is considered overweight.  A BMI of 30 and above is considered obese.  Maintain normal blood lipids and cholesterol levels by exercising and minimizing your intake of saturated fat. Eat a balanced diet with plenty of fruit and vegetables. If your lipid or cholesterol levels are high, you are over 50, or you are at high risk for heart disease, you may need your cholesterol levels checked more frequently. Ongoing high lipid and cholesterol levels should be treated with medicines if diet and exercise are not working.  If you smoke, find out from your health care provider how to quit. If you do not use tobacco, do not start.  Lung cancer screening is recommended for adults aged 4-80 years who are at high risk for developing lung cancer because of a history of smoking. A yearly low-dose CT scan of the lungs is recommended for people who have at least a 30-pack-year history of smoking and are a current smoker or have quit within the past 15 years. A pack year of smoking is smoking an average of 1 pack of cigarettes a day for 1 year (for example: 1 pack a day for 30 years or 2 packs a day  for 15 years). Yearly screening should continue until the smoker has stopped smoking for at least 15 years. Yearly screening should be stopped for people who develop a health problem that would prevent them from having lung cancer treatment.  Avoid use of street drugs. Do not share needles with anyone. Ask for help if you need support or instructions about stopping the use of drugs.  High blood pressure causes heart disease and increases the risk of stroke.  Ongoing high blood pressure should be treated with medicines if weight loss and exercise do not work.  If you are 80-42 years old, ask your health care provider if you should take  aspirin to prevent strokes.  Diabetes screening involves taking a blood sample to check your fasting blood sugar level. This should be done once every 3 years, after age 60, if you are within normal weight and without risk factors for diabetes. Testing should be considered at a younger age or be carried out more frequently if you are overweight and have at least 1 risk factor for diabetes.  Breast cancer screening is essential preventive care for women. You should practice "breast self-awareness." This means understanding the normal appearance and feel of your breasts and may include breast self-examination. Any changes detected, no matter how small, should be reported to a health care provider. Women in their 37s and 30s should have a clinical breast exam (CBE) by a health care provider as part of a regular health exam every 1 to 3 years. After age 10, women should have a CBE every year. Starting at age 31, women should consider having a mammogram (breast X-ray test) every year. Women who have a family history of breast cancer should talk to their health care provider about genetic screening. Women at a high risk of breast cancer should talk to their health care providers about having an MRI and a mammogram every year.  Breast cancer gene (BRCA)-related cancer risk assessment is recommended for women who have family members with BRCA-related cancers. BRCA-related cancers include breast, ovarian, tubal, and peritoneal cancers. Having family members with these cancers may be associated with an increased risk for harmful changes (mutations) in the breast cancer genes BRCA1 and BRCA2. Results of the assessment will determine the need for genetic counseling and BRCA1 and BRCA2 testing.  Routine pelvic exams to screen for cancer are no longer recommended for nonpregnant women who are considered low risk for cancer of the pelvic organs (ovaries, uterus, and vagina) and who do not have symptoms. Ask your health  care provider if a screening pelvic exam is right for you.  If you have had past treatment for cervical cancer or a condition that could lead to cancer, you need Pap tests and screening for cancer for at least 20 years after your treatment. If Pap tests have been discontinued, your risk factors (such as having a new sexual partner) need to be reassessed to determine if screening should be resumed. Some women have medical problems that increase the chance of getting cervical cancer. In these cases, your health care provider may recommend more frequent screening and Pap tests.    Colorectal cancer can be detected and often prevented. Most routine colorectal cancer screening begins at the age of 24 years and continues through age 74 years. However, your health care provider may recommend screening at an earlier age if you have risk factors for colon cancer. On a yearly basis, your health care provider may provide home test kits to check  for hidden blood in the stool. Use of a small camera at the end of a tube, to directly examine the colon (sigmoidoscopy or colonoscopy), can detect the earliest forms of colorectal cancer. Talk to your health care provider about this at age 1, when routine screening begins.  Direct exam of the colon should be repeated every 5-10 years through age 7 years, unless early forms of pre-cancerous polyps or small growths are found.  Osteoporosis is a disease in which the bones lose minerals and strength with aging. This can result in serious bone fractures or breaks. The risk of osteoporosis can be identified using a bone density scan. Women ages 31 years and over and women at risk for fractures or osteoporosis should discuss screening with their health care providers. Ask your health care provider whether you should take a calcium supplement or vitamin D to reduce the rate of osteoporosis.  Menopause can be associated with physical symptoms and risks. Hormone replacement therapy  is available to decrease symptoms and risks. You should talk to your health care provider about whether hormone replacement therapy is right for you.  Use sunscreen. Apply sunscreen liberally and repeatedly throughout the day. You should seek shade when your shadow is shorter than you. Protect yourself by wearing long sleeves, pants, a wide-brimmed hat, and sunglasses year round, whenever you are outdoors.  Once a month, do a whole body skin exam, using a mirror to look at the skin on your back. Tell your health care provider of new moles, moles that have irregular borders, moles that are larger than a pencil eraser, or moles that have changed in shape or color.  Stay current with required vaccines (immunizations).  Influenza vaccine. All adults should be immunized every year.  Tetanus, diphtheria, and acellular pertussis (Td, Tdap) vaccine. Pregnant women should receive 1 dose of Tdap vaccine during each pregnancy. The dose should be obtained regardless of the length of time since the last dose. Immunization is preferred during the 27th-36th week of gestation. An adult who has not previously received Tdap or who does not know her vaccine status should receive 1 dose of Tdap. This initial dose should be followed by tetanus and diphtheria toxoids (Td) booster doses every 10 years. Adults with an unknown or incomplete history of completing a 3-dose immunization series with Td-containing vaccines should begin or complete a primary immunization series including a Tdap dose. Adults should receive a Td booster every 10 years.    Zoster vaccine. One dose is recommended for adults aged 40 years or older unless certain conditions are present.    Pneumococcal 13-valent conjugate (PCV13) vaccine. When indicated, a person who is uncertain of her immunization history and has no record of immunization should receive the PCV13 vaccine. An adult aged 76 years or older who has certain medical conditions and has not  been previously immunized should receive 1 dose of PCV13 vaccine. This PCV13 should be followed with a dose of pneumococcal polysaccharide (PPSV23) vaccine. The PPSV23 vaccine dose should be obtained at least 1 or more year(s) after the dose of PCV13 vaccine. An adult aged 1 years or older who has certain medical conditions and previously received 1 or more doses of PPSV23 vaccine should receive 1 dose of PCV13. The PCV13 vaccine dose should be obtained 1 or more years after the last PPSV23 vaccine dose.    Pneumococcal polysaccharide (PPSV23) vaccine. When PCV13 is also indicated, PCV13 should be obtained first. All adults aged 43 years and older should  be immunized. An adult younger than age 65 years who has certain medical conditions should be immunized. Any person who resides in a nursing home or long-term care facility should be immunized. An adult smoker should be immunized. People with an immunocompromised condition and certain other conditions should receive both PCV13 and PPSV23 vaccines. People with human immunodeficiency virus (HIV) infection should be immunized as soon as possible after diagnosis. Immunization during chemotherapy or radiation therapy should be avoided. Routine use of PPSV23 vaccine is not recommended for American Indians, [place] Natives, or people younger than 65 years unless there are medical conditions that require PPSV23 vaccine. When indicated, people who have unknown immunization and have no record of immunization should receive PPSV23 vaccine. One-time revaccination 5 years after the first dose of PPSV23 is recommended for people aged 19-64 years who have chronic kidney failure, nephrotic syndrome, asplenia, or immunocompromised conditions. People who received 1-2 doses of PPSV23 before age 65 years should receive another dose of PPSV23 vaccine at age 65 years or later if at least 5 years have passed since the previous dose. Doses of PPSV23 are not needed for people immunized  with PPSV23 at or after age 65 years.   Preventive Services / Frequency  Ages 65 years and over  Blood pressure check.  Lipid and cholesterol check.  Lung cancer screening. / Every year if you are aged 55-80 years and have a 30-pack-year history of smoking and currently smoke or have quit within the past 15 years. Yearly screening is stopped once you have quit smoking for at least 15 years or develop a health problem that would prevent you from having lung cancer treatment.  Clinical breast exam.** / Every year after age 40 years.   BRCA-related cancer risk assessment.** / For women who have family members with a BRCA-related cancer (breast, ovarian, tubal, or peritoneal cancers).  Mammogram.** / Every year beginning at age 40 years and continuing for as long as you are in good health. Consult with your health care provider.  Pap test.** / Every 3 years starting at age 30 years through age 65 or 70 years with 3 consecutive normal Pap tests. Testing can be stopped between 65 and 70 years with 3 consecutive normal Pap tests and no abnormal Pap or HPV tests in the past 10 years.  Fecal occult blood test (FOBT) of stool. / Every year beginning at age 50 years and continuing until age 75 years. You may not need to do this test if you get a colonoscopy every 10 years.  Flexible sigmoidoscopy or colonoscopy.** / Every 5 years for a flexible sigmoidoscopy or every 10 years for a colonoscopy beginning at age 50 years and continuing until age 75 years.  Hepatitis C blood test.** / For all people born from 1945 through 1965 and any individual with known risks for hepatitis C.  Osteoporosis screening.** / A one-time screening for women ages 65 years and over and women at risk for fractures or osteoporosis.  Skin self-exam. / Monthly.  Influenza vaccine. / Every year.  Tetanus, diphtheria, and acellular pertussis (Tdap/Td) vaccine.** / 1 dose of Td every 10 years.  Zoster vaccine.** / 1 dose  for adults aged 60 years or older.  Pneumococcal 13-valent conjugate (PCV13) vaccine.** / Consult your health care provider.  Pneumococcal polysaccharide (PPSV23) vaccine.** / 1 dose for all adults aged 65 years and older. Screening for abdominal aortic aneurysm (AAA)  by ultrasound is recommended for people who have history of high blood pressure   or who are current or former smokers. ++++++++++++++++++++ Recommend Adult Low Dose Aspirin or  coated  Aspirin 81 mg daily  To reduce risk of Colon Cancer 20 %,  Skin Cancer 26 % ,  Melanoma 46%  and  Pancreatic cancer 60% ++++++++++++++++++++ Vitamin D goal  is between 70-100.  Please make sure that you are taking your Vitamin D as directed.  It is very important as a natural anti-inflammatory  helping hair, skin, and nails, as well as reducing stroke and heart attack risk.  It helps your bones and helps with mood. It also decreases numerous cancer risks so please take it as directed.  Low Vit D is associated with a 200-300% higher risk for CANCER  and 200-300% higher risk for HEART   ATTACK  &  STROKE.   ...................................... It is also associated with higher death rate at younger ages,  autoimmune diseases like Rheumatoid arthritis, Lupus, Multiple Sclerosis.    Also many other serious conditions, like depression, Alzheimer's Dementia, infertility, muscle aches, fatigue, fibromyalgia - just to name a few. ++++++++++++++++++ Recommend the book "The END of DIETING" by Dr Joel Fuhrman  & the book "The END of DIABETES " by Dr Joel Fuhrman At [employer] - get book & Audio CD's    Being diabetic has a  300% increased risk for heart attack, stroke, cancer, and alzheimer- type vascular dementia. It is very important that you work harder with diet by avoiding all foods that are white. Avoid white rice (brown & wild rice is OK), white potatoes (sweetpotatoes in moderation is OK), White bread or wheat bread or anything made out of  white flour like bagels, donuts, rolls, buns, biscuits, cakes, pastries, cookies, pizza crust, and pasta (made from white flour & egg whites) - vegetarian pasta or spinach or wheat pasta is OK. Multigrain breads like Arnold's or Pepperidge [employer], or multigrain sandwich thins or flatbreads.  Diet, exercise and weight loss can reverse and cure diabetes in the early stages.  Diet, exercise and weight loss is very important in the control and prevention of complications of diabetes which affects every system in your body, ie. Brain - dementia/stroke, eyes - glaucoma/blindness, heart - heart attack/heart failure, kidneys - dialysis, stomach - gastric paralysis, intestines - malabsorption, nerves - severe painful neuritis, circulation - gangrene & loss of a leg(s), and finally cancer and Alzheimers.    I recommend avoid fried & greasy foods,  sweets/candy, white rice (brown or wild rice or Quinoa is OK), white potatoes (sweet potatoes are OK) - anything made from white flour - bagels, doughnuts, rolls, buns, biscuits,white and wheat breads, pizza crust and traditional pasta made of white flour & egg white(vegetarian pasta or spinach or wheat pasta is OK).  Multi-grain bread is OK - like multi-grain flat bread or sandwich thins. Avoid alcohol in excess. Exercise is also important.    Eat all the vegetables you want - avoid meat, especially red meat and dairy - especially cheese.  Cheese is the most concentrated form of trans-fats which is the worst thing to clog up our arteries. Veggie cheese is OK which can be found in the fresh produce section at [employer] or [employer] or [employer]  +++++++++++++++++++ DASH Eating Plan  DASH stands for "Dietary Approaches to Stop Hypertension."   The DASH eating plan is a healthy eating plan that has been shown to reduce high blood pressure (hypertension). Additional health benefits may include reducing the risk of type 2 diabetes mellitus, heart disease,   and stroke. The  DASH eating plan may also help with weight loss. WHAT DO I NEED TO KNOW ABOUT THE DASH EATING PLAN? For the DASH eating plan, you will follow these general guidelines:  Choose foods with a percent daily value for sodium of less than 5% (as listed on the food label).  Use salt-free seasonings or herbs instead of table salt or sea salt.  Check with your health care provider or pharmacist before using salt substitutes.  Eat lower-sodium products, often labeled as "lower sodium" or "no salt added."  Eat fresh foods.  Eat more vegetables, fruits, and low-fat dairy products.  Choose whole grains. Look for the word "whole" as the first word in the ingredient list.  Choose fish   Limit sweets, desserts, sugars, and sugary drinks.  Choose heart-healthy fats.  Eat veggie cheese   Eat more home-cooked food and less restaurant, buffet, and fast food.  Limit fried foods.  Kreis foods using methods other than frying.  Limit canned vegetables. If you do use them, rinse them well to decrease the sodium.  When eating at a restaurant, ask that your food be prepared with less salt, or no salt if possible.                      WHAT FOODS CAN I EAT? Read Dr Fara Olden Fuhrman's books on The End of Dieting & The End of Diabetes  Grains Whole grain or whole wheat bread. Brown rice. Whole grain or whole wheat pasta. Quinoa, bulgur, and whole grain cereals. Low-sodium cereals. Corn or whole wheat flour tortillas. Whole grain cornbread. Whole grain crackers. Low-sodium crackers.  Vegetables Fresh or frozen vegetables (raw, steamed, roasted, or grilled). Low-sodium or reduced-sodium tomato and vegetable juices. Low-sodium or reduced-sodium tomato sauce and paste. Low-sodium or reduced-sodium canned vegetables.   Fruits All fresh, canned (in natural juice), or frozen fruits.  Protein Products  All fish and seafood.  Dried beans, peas, or lentils. Unsalted nuts and seeds. Unsalted canned  beans.  Dairy Low-fat dairy products, such as skim or 1% milk, 2% or reduced-fat cheeses, low-fat ricotta or cottage cheese, or plain low-fat yogurt. Low-sodium or reduced-sodium cheeses.  Fats and Oils Tub margarines without trans fats. Light or reduced-fat mayonnaise and salad dressings (reduced sodium). Avocado. Safflower, olive, or canola oils. Natural peanut or almond butter.  Other Unsalted popcorn and pretzels. The items listed above may not be a complete list of recommended foods or beverages. Contact your dietitian for more options.  +++++++++++++++  WHAT FOODS ARE NOT RECOMMENDED? Grains/ White flour or wheat flour White bread. White pasta. White rice. Refined cornbread. Bagels and croissants. Crackers that contain trans fat.  Vegetables  Creamed or fried vegetables. Vegetables in a . Regular canned vegetables. Regular canned tomato sauce and paste. Regular tomato and vegetable juices.  Fruits Dried fruits. Canned fruit in light or heavy syrup. Fruit juice.  Meat and Other Protein Products Meat in general - RED meat & White meat.  Fatty cuts of meat. Ribs, chicken wings, all processed meats as bacon, sausage, bologna, salami, fatback, hot dogs, bratwurst and packaged luncheon meats.  Dairy Whole or 2% milk, cream, half-and-half, and cream cheese. Whole-fat or sweetened yogurt. Full-fat cheeses or blue cheese. Non-dairy creamers and whipped toppings. Processed cheese, cheese spreads, or cheese curds.  Condiments Onion and garlic salt, seasoned salt, table salt, and sea salt. Canned and packaged gravies. Worcestershire sauce. Tartar sauce. Barbecue sauce. Teriyaki sauce. Soy sauce, including reduced  sodium. Steak sauce. Fish sauce. Oyster sauce. Cocktail sauce. Horseradish. Ketchup and mustard. Meat flavorings and tenderizers. Bouillon cubes. Hot sauce. Tabasco sauce. Marinades. Taco seasonings. Relishes.  Fats and Oils Butter, stick margarine, lard, shortening and bacon  fat. Coconut, palm kernel, or palm oils. Regular salad dressings.  Pickles and olives. Salted popcorn and pretzels.  The items listed above may not be a complete list of foods and beverages to avoid.

## 2017-11-12 NOTE — Progress Notes (Addendum)
ADULT & ADOLESCENT INTERNAL MEDICINE Unk Pinto, M.D.     [name]. [name], P.A.-C Liane Comber, [place] [address] [place], [place] [address] Telephone [phone] Telefax [phone]  Comprehensive Evaluation &  Examination     This very nice 83 y.o. [facility] presents for a  comprehensive evaluation and management of multiple medical co-morbidities.  Patient has been followed for HTN, HLD, T2_NIDDM  and Vitamin D Deficiency.Patient has hx/o Gout controlled on her meds. She also has GERD controlled on her meds.       HTN predates since 70. Patient's BP has been controlled at home and patient denies any cardiac symptoms as chest pain, palpitations, shortness of breath, dizziness or ankle swelling. Today's BP is at goal- 124/56.      Patient's hyperlipidemia is controlled with diet and medications. Patient denies myalgias or other medication SE's. Last lipids were at goal: Lab Results  Component Value Date   CHOL 166 11/12/2017   HDL 53 11/12/2017   LDLCALC 83 11/12/2017   TRIG 198 (H) 11/12/2017   CHOLHDL 3.1 11/12/2017      Patient has T2_NIDDM (2007) which she attempting control with diet because of intolerance to Metformin (diarrhea) and patient denies reactive hypoglycemic symptoms, visual blurring, diabetic polys, or paresthesias. She reports CBG's < 120 mg% and last A1c was not at goal: Lab Results  Component Value Date   HGBA1C 6.5 (H) 07/19/2017      Finally, patient has history of Vitamin D Deficiency and last Vitamin D was  Lab Results  Component Value Date   VD25OH 47 04/16/2017   Current Outpatient Medications on File Prior to Visit  Medication Sig  . allopurinol (ZYLOPRIM) 300 MG tablet Take 0.5 tablets (150 mg total) by mouth daily.  Marland Kitchen aspirin EC 81 MG tablet Take 81 mg by mouth daily.  Marland Kitchen atenolol (TENORMIN) 50 MG tablet TAKE 1 TABLET (50 MG TOTAL) BY MOUTH DAILY.  . cholecalciferol (VITAMIN D) 1000  UNITS tablet Take 5,000 Units by mouth every other day.   . citalopram (CELEXA) 20 MG tablet Take 1 tablet (20 mg total) by mouth daily.  Marland Kitchen diltiazem (CARDIZEM CD) 240 MG 24 hr capsule TAKE 1 CAPSULE BY MOUTH ONCE DAILY FOR BLOOD PRESSURE  . fish oil-omega-3 fatty acids 1000 MG capsule Take 1 g by mouth daily.  Marland Kitchen FREESTYLE LITE test strip   . indapamide (LOZOL) 1.25 MG tablet TAKE ONE TABLET BY MOUTH TWICE DAILY FOR BLOOD PRESSURE AND FLUID  . Lancets (FREESTYLE) lancets   . lisinopril (PRINIVIL,ZESTRIL) 20 MG tablet TAKE ONE TABLET BY MOUTH ONCE DAILY FOR BLOOD PRESSURE.  . pravastatin (PRAVACHOL) 40 MG tablet TAKE 1 TABLET BY MOUTH DAILY.  . ranitidine (ZANTAC) 300 MG tablet TAKE ONE TABLET BY MOUTH ONCE DAILY AS NEEDED FOR  ACID  INDIGESTION  . triamcinolone cream (KENALOG) 0.1 %    No current facility-administered medications on file prior to visit.    Allergies  Allergen Reactions  . Buspar [Buspirone]     dysphoria  . Lipitor [Atorvastatin]     Myalgias  . Loop Diuretics     Fatigue/weakness  . Metformin And Related     Gas/bloating  . Nsaids     GI upset  . Sulfa Antibiotics Other (See Comments)    Reaction=throat itching  . Sulfonamide Derivatives    Past Medical History:  Diagnosis Date  . A-fib ([place])   . Anxiety   . Arthritis   .  Diabetes mellitus   . Gout   . Hyperlipemia   . Hypertension    Health Maintenance  Topic Date Due  . PNA vac Low Risk Adult (2 of 2 - PCV13) 12/31/2022 (Originally 06/06/2004)  . HEMOGLOBIN A1C  01/17/2018  . INFLUENZA VACCINE  02/27/2018  . MAMMOGRAM  03/25/2018  . OPHTHALMOLOGY EXAM  06/17/2018  . FOOT EXAM  11/13/2018  . TETANUS/TDAP  11/13/2027  . DEXA SCAN  Completed   Immunization History  Administered Date(s) Administered  . Influenza Split 07/13/2014  . Influenza, High Dose Seasonal PF 04/14/2015, 04/16/2017  . Pneumococcal-Unspecified 06/07/2003  . Td 06/02/2007, 11/12/2017  . Zoster 05/30/2012   Last Colon -  Never - is agreeable to Cologard.  Last MGM - 08.27.2018 and Dexa BMD at [facility]  Past Surgical History:  Procedure Laterality Date  . ABDOMINAL HYSTERECTOMY    . BIOPSY BREAST     (L) BREAST IN 1993  . KNEE ARTHROSCOPY     Family History  Problem Relation Age of Onset  . Diabetes Mother   . Hypertension Father   . CVA Father    Social History   Tobacco Use  . Smoking status: Former Smoker    Last attempt to quit: 07/30/2006    Years since quitting: 11.2  . Smokeless tobacco: Never Used  Substance Use Topics  . Alcohol use: Yes    Alcohol/week: 1.0 oz    Types: 2 Standard drinks or equivalent per week  . Drug use: Not on file    ROS Constitutional: Denies fever, chills, weight loss/gain, headaches, insomnia,  night sweats, and change in appetite. Does c/o fatigue. Eyes: Denies redness, blurred vision, diplopia, discharge, itchy, watery eyes.  ENT: Denies discharge, congestion, post nasal drip, epistaxis, sore throat, earache, hearing loss, dental pain, Tinnitus, Vertigo, Sinus pain, snoring.  Cardio: Denies chest pain, palpitations, irregular heartbeat, syncope, dyspnea, diaphoresis, orthopnea, PND, claudication, edema Respiratory: denies cough, dyspnea, DOE, pleurisy, hoarseness, laryngitis, wheezing.  Gastrointestinal: Denies dysphagia, heartburn, reflux, water brash, pain, cramps, nausea, vomiting, bloating, diarrhea, constipation, hematemesis, melena, hematochezia, jaundice, hemorrhoids Genitourinary: Denies dysuria, frequency, urgency, nocturia, hesitancy, discharge, hematuria, flank pain Breast: Breast lumps, nipple discharge, bleeding.  Musculoskeletal: Denies arthralgia, myalgia, stiffness, Jt. Swelling, pain, limp, and strain/sprain. Denies falls. Skin: Denies puritis, rash, hives, warts, acne, eczema, changing in skin lesion Neuro: No weakness, tremor, incoordination, spasms, paresthesia, pain Psychiatric: Denies confusion, memory loss, sensory loss. Denies  Depression. Endocrine: Denies change in weight, skin, hair change, nocturia, and paresthesia, diabetic polys, visual blurring, hyper / hypo glycemic episodes.  Heme/Lymph: No excessive bleeding, bruising, enlarged lymph nodes.  Physical Exam  BP (!) 124/56   Pulse 60   Temp (!) 97.3 F (36.3 C)   Resp 18   Ht 5' 4.5" (1.638 m)   Wt 163 lb (73.9 kg)   BMI 27.55 kg/m   General Appearance: Well nourished, well groomed and in no apparent distress.  Eyes: PERRLA, EOMs, conjunctiva no swelling or erythema, normal fundi and vessels. Sinuses: No frontal/maxillary tenderness ENT/Mouth: EACs patent / TMs  nl. Nares clear without erythema, swelling, mucoid exudates. Oral hygiene is good. No erythema, swelling, or exudate. Tongue normal, non-obstructing. Tonsils not swollen or erythematous. Hearing normal.  Neck: Supple, thyroid not palpable. No bruits, nodes or JVD. Respiratory: Respiratory effort normal.  BS equal and clear bilateral without rales, rhonci, wheezing or stridor. Cardio: Heart sounds are normal with regular rate and rhythm and no murmurs, rubs or gallops. Peripheral pulses are normal and  equal bilaterally without edema. No aortic or femoral bruits. Chest: symmetric with normal excursions and percussion. Breasts: Symmetric, without lumps, nipple discharge, retractions, or fibrocystic changes.  Abdomen: Flat, soft with bowel sounds active. Nontender, no guarding, rebound, hernias, masses, or organomegaly.  Lymphatics: Non tender without lymphadenopathy.  Genitourinary:  Musculoskeletal: Full ROM all peripheral extremities, joint stability, 5/5 strength, and normal gait. Skin: Warm and dry without rashes, lesions, cyanosis, clubbing or  ecchymosis.  Neuro: Cranial nerves intact, reflexes equal bilaterally. Normal muscle tone, no cerebellar symptoms.Sensation intact to touch, vibratory and Monofilament to the toes bilaterally.  Pysch: Alert and oriented X 3, normal affect, Insight and  Judgment appropriate.   Assessment and Plan  1. Essential hypertension  - EKG 12-Lead - Urinalysis, Routine w reflex microscopic - Microalbumin / creatinine urine ratio - CBC with Differential/Platelet - BASIC METABOLIC PANEL WITH GFR - Magnesium - TSH  2. Hyperlipidemia, mixed  - EKG 12-Lead - Hepatic function panel - Lipid panel - TSH  3. Type 2 diabetes mellitus with stage 2 chronic kidney disease, without long-term current use of insulin (HCC)  - EKG 12-Lead - Urinalysis, Routine w reflex microscopic - Microalbumin / creatinine urine ratio - HM DIABETES FOOT EXAM - LOW EXTREMITY NEUR EXAM DOCUM - Hemoglobin A1c - Insulin, random  4. Vitamin D deficiency  - VITAMIN D 25 Hydroxy (Vit-D Deficiency, Fractures)  5. Gastroesophageal reflux disease, esophagitis presence not specified  - CBC with Differential/Platelet  6. Paroxysmal atrial fibrillation (HCC)  - EKG 12-Lead  7. Screening for colorectal cancer  - POC Hemoccult Bld/Stl   8. Screening for ischemic heart disease  - EKG 12-Lead  9. Idiopathic gout  - Uric acid  10. Medication management  - Uric acid - BASIC METABOLIC PANEL WITH GFR - Hepatic function panel - Magnesium - Lipid panel - TSH - Hemoglobin A1c - Insulin, random - VITAMIN D 25 Hydroxyl  11. Need for prophylactic vaccination with tetanus-diphtheria (Td)  - Td : Tetanus/diphtheria >7yo Preservative  free           Patient was counseled in prudent diet to achieve/maintain BMI less than 25 for weight control, BP monitoring, regular exercise and medications. Discussed med's effects and SE's. Screening labs and tests as requested with regular follow-up as recommended. Over 40 minutes of exam, counseling, chart review and high complex critical decision making was performed.

## 2017-11-13 DIAGNOSIS — E1122 Type 2 diabetes mellitus with diabetic chronic kidney disease: Secondary | ICD-10-CM | POA: Diagnosis not present

## 2017-11-13 DIAGNOSIS — N182 Chronic kidney disease, stage 2 (mild): Secondary | ICD-10-CM | POA: Diagnosis not present

## 2017-11-13 DIAGNOSIS — E559 Vitamin D deficiency, unspecified: Secondary | ICD-10-CM | POA: Diagnosis not present

## 2017-11-13 DIAGNOSIS — E782 Mixed hyperlipidemia: Secondary | ICD-10-CM | POA: Diagnosis not present

## 2017-11-13 DIAGNOSIS — I10 Essential (primary) hypertension: Secondary | ICD-10-CM | POA: Diagnosis not present

## 2017-11-13 DIAGNOSIS — Z79899 Other long term (current) drug therapy: Secondary | ICD-10-CM | POA: Diagnosis not present

## 2017-11-13 DIAGNOSIS — K219 Gastro-esophageal reflux disease without esophagitis: Secondary | ICD-10-CM | POA: Diagnosis not present

## 2017-11-13 DIAGNOSIS — M1000 Idiopathic gout, unspecified site: Secondary | ICD-10-CM | POA: Diagnosis not present

## 2017-11-13 LAB — CBC WITH DIFFERENTIAL/PLATELET
BASOS PCT: 0.7 %
Basophils Absolute: 50 cells/uL (ref 0–200)
EOS ABS: 107 {cells}/uL (ref 15–500)
Eosinophils Relative: 1.5 %
HCT: 40.9 % (ref 35.0–45.0)
HEMOGLOBIN: 14.2 g/dL (ref 11.7–15.5)
LYMPHS ABS: 2407 {cells}/uL (ref 850–3900)
MCH: 32.9 pg (ref 27.0–33.0)
MCHC: 34.7 g/dL (ref 32.0–36.0)
MCV: 94.9 fL (ref 80.0–100.0)
MPV: 11 fL (ref 7.5–12.5)
Monocytes Relative: 6.4 %
NEUTROS ABS: 4083 {cells}/uL (ref 1500–7800)
Neutrophils Relative %: 57.5 %
PLATELETS: 188 10*3/uL (ref 140–400)
RBC: 4.31 10*6/uL (ref 3.80–5.10)
RDW: 12.8 % (ref 11.0–15.0)
Total Lymphocyte: 33.9 %
WBC: 7.1 10*3/uL (ref 3.8–10.8)
WBCMIX: 454 {cells}/uL (ref 200–950)

## 2017-11-13 LAB — LIPID PANEL
Cholesterol: 166 mg/dL (ref <200)
HDL: 53 mg/dL (ref >50)
LDL Cholesterol (Calc): 83 mg/dL (calc)
Non-HDL Cholesterol (Calc): 113 mg/dL (calc) (ref <130)
Total CHOL/HDL Ratio: 3.1 (calc) (ref <5.0)
Triglycerides: 198 mg/dL — ABNORMAL HIGH (ref <150)

## 2017-11-13 LAB — HEPATIC FUNCTION PANEL
AG Ratio: 1.9 (calc) (ref 1.0–2.5)
ALT: 16 U/L (ref 6–29)
AST: 19 U/L (ref 10–35)
Albumin: 4.3 g/dL (ref 3.6–5.1)
Alkaline phosphatase (APISO): 45 U/L (ref 33–130)
Bilirubin, Direct: 0.1 mg/dL (ref 0.0–0.2)
Globulin: 2.3 g/dL (calc) (ref 1.9–3.7)
Indirect Bilirubin: 0.3 mg/dL (calc) (ref 0.2–1.2)
Total Bilirubin: 0.4 mg/dL (ref 0.2–1.2)
Total Protein: 6.6 g/dL (ref 6.1–8.1)

## 2017-11-13 LAB — INSULIN, RANDOM: Insulin: 9.4 u[IU]/mL (ref 2.0–19.6)

## 2017-11-13 LAB — URIC ACID: Uric Acid, Serum: 5.6 mg/dL (ref 2.5–7.0)

## 2017-11-13 LAB — BASIC METABOLIC PANEL WITH GFR
BUN: 23 mg/dL (ref 7–25)
CALCIUM: 9.7 mg/dL (ref 8.6–10.4)
CHLORIDE: 100 mmol/L (ref 98–110)
CO2: 34 mmol/L — ABNORMAL HIGH (ref 20–32)
CREATININE: 0.87 mg/dL (ref 0.60–0.88)
GFR, EST AFRICAN AMERICAN: 73 mL/min/{1.73_m2} (ref >=60)
GFR, Est Non African American: 63 mL/min/{1.73_m2} (ref >=60)
Glucose, Bld: 108 mg/dL — ABNORMAL HIGH (ref 65–99)
Potassium: 4.6 mmol/L (ref 3.5–5.3)
Sodium: 140 mmol/L (ref 135–146)

## 2017-11-13 LAB — HEMOGLOBIN A1C
EAG (MMOL/L): 7.9 (calc)
HEMOGLOBIN A1C: 6.6 %{Hb} — AB (ref <5.7)
MEAN PLASMA GLUCOSE: 143 (calc)

## 2017-11-13 LAB — VITAMIN D 25 HYDROXY (VIT D DEFICIENCY, FRACTURES): Vit D, 25-Hydroxy: 30 ng/mL (ref 30–100)

## 2017-11-13 LAB — TSH: TSH: 2.49 mIU/L (ref 0.40–4.50)

## 2017-11-13 LAB — MAGNESIUM: MAGNESIUM: 1.8 mg/dL (ref 1.5–2.5)

## 2017-11-14 LAB — URINALYSIS, ROUTINE W REFLEX MICROSCOPIC
Bilirubin Urine: NEGATIVE
GLUCOSE, UA: NEGATIVE
HGB URINE DIPSTICK: NEGATIVE
Ketones, ur: NEGATIVE
Leukocytes, UA: NEGATIVE
NITRITE: NEGATIVE
PROTEIN: NEGATIVE
Specific Gravity, Urine: 1.015 (ref 1.001–1.03)

## 2017-11-14 LAB — MICROALBUMIN / CREATININE URINE RATIO
Creatinine, Urine: 48 mg/dL (ref 20–275)
Microalb Creat Ratio: 6 mcg/mg creat (ref <30)
Microalb, Ur: 0.3 mg/dL

## 2017-11-19 DIAGNOSIS — Z1211 Encounter for screening for malignant neoplasm of colon: Secondary | ICD-10-CM | POA: Diagnosis not present

## 2017-11-19 DIAGNOSIS — Z1212 Encounter for screening for malignant neoplasm of rectum: Secondary | ICD-10-CM | POA: Diagnosis not present

## 2017-11-29 ENCOUNTER — Telehealth: Payer: Self-pay | Admitting: Internal Medicine

## 2017-11-29 LAB — COLOGUARD: COLOGUARD: POSITIVE

## 2017-11-29 NOTE — Telephone Encounter (Signed)
Positive Cologuard results given to patient. Patient understands results and requests referral to  Dr Arta Silence. Per Dr Melford Aase, referral routed to Dr Paulita Fujita.

## 2017-12-02 ENCOUNTER — Encounter: Payer: Self-pay | Admitting: *Deleted

## 2017-12-10 ENCOUNTER — Encounter: Payer: Self-pay | Admitting: Internal Medicine

## 2017-12-17 DIAGNOSIS — R195 Other fecal abnormalities: Secondary | ICD-10-CM | POA: Diagnosis not present

## 2018-01-02 DIAGNOSIS — R195 Other fecal abnormalities: Secondary | ICD-10-CM | POA: Diagnosis not present

## 2018-01-02 LAB — HM COLONOSCOPY

## 2018-01-16 ENCOUNTER — Encounter: Payer: Self-pay | Admitting: *Deleted

## 2018-02-25 ENCOUNTER — Other Ambulatory Visit: Payer: Self-pay | Admitting: Internal Medicine

## 2018-03-06 DIAGNOSIS — M8580 Other specified disorders of bone density and structure, unspecified site: Secondary | ICD-10-CM | POA: Insufficient documentation

## 2018-03-06 DIAGNOSIS — N182 Chronic kidney disease, stage 2 (mild): Secondary | ICD-10-CM

## 2018-03-06 DIAGNOSIS — E1122 Type 2 diabetes mellitus with diabetic chronic kidney disease: Secondary | ICD-10-CM | POA: Insufficient documentation

## 2018-03-06 DIAGNOSIS — E663 Overweight: Secondary | ICD-10-CM | POA: Insufficient documentation

## 2018-03-06 NOTE — Progress Notes (Signed)
MEDICARE ANNUAL WELLNESS VISIT AND FOLLOW UP  Assessment:   Encounter for Medicare annual wellness exam  Essential hypertension - continue medications, DASH diet, exercise and monitor at home. Call if greater than 130/80.  -     CBC with Differential/Platelet -     CMP/GFR -     TSH  Paroxysmal atrial fibrillation (HCC) Rate controlled and in NSR with cardizem, not on anticoagulation with CHADSVASC of 5, discussed with patient, has not had Afib over 2-3 years, understands risk of stroke and declines at this time, continue ASA, will call office or go to ER if any symptoms, continue to monitor EKG in the office.   Gastroesophageal reflux disease, esophagitis presence not specified Continue PPI/H2 blocker, diet discussed  Type 2 diabetes mellitus with stage 2 chronic kidney disease, without long-term current use of insulin ([place]) Discussed general issues about diabetes pathophysiology and management., Educational material distributed., Suggested low cholesterol diet., Encouraged aerobic exercise., Discussed foot care., Reminded to get yearly retinal exam - report verified/abstracted Just had foot exam at CPE Patient requests we defer A1C to Dr. Chalmers Cater  Stage 2 CKD due to T2DM ([place]) Increase fluids, avoid NSAIDS, monitor sugars, will monitor      -      CMP/GFR  Mixed hyperlipidemia -continue medications, check lipids, decrease fatty foods, increase activity.  -     Lipid panel  Idiopathic gout, unspecified chronicity, unspecified site Gout- recheck Uric acid as needed, Diet discussed, continue medications.  Vitamin D deficiency Continue supplementation Check vitamin D level  Osteopenia Continue Vit D and Ca, weight bearing exercises Repeat dexa 2 years - 2020  Medication management -     Magnesium    Over 40 minutes of exam, counseling, chart review and critical decision making was performed Future Appointments  Date Time Provider [place]  06/13/2018 11:00 AM  Unk Pinto, MD [facility] None  12/18/2018  3:00 PM Unk Pinto, MD [facility] None    Plan:   During the course of the visit the patient was educated and counseled about appropriate screening and preventive services including:    Pneumococcal vaccine   Prevnar 13  Influenza vaccine  Td vaccine  Screening electrocardiogram  Bone densitometry screening  Colorectal cancer screening  Diabetes screening  Glaucoma screening  Nutrition counseling   Advanced directives: requested   Subjective:  Ruth Delgado is a 83 y.o. female who presents for Medicare Annual Wellness Visit and 3 month follow up for HTN, chol, T2DM with CKD.   BMI is Body mass index is 27.38 kg/m., she has been working on diet and exercise. Wt Readings from Last 3 Encounters:  03/07/18 162 lb (73.5 kg)  11/12/17 163 lb (73.9 kg)  08/28/17 157 lb 6.4 oz (71.4 kg)   Her blood pressure has been controlled at home, today their BP is BP: 122/70  She does workout. She denies chest pain, shortness of breath, dizziness. She has a history of Afib, rate controlled, low risk for stroke, patient declines anticoagulation.    She is on cholesterol medication (pravastatin 40 mg daily) and denies myalgias. Her cholesterol is at goal. The cholesterol last visit was:   Lab Results  Component Value Date   CHOL 166 11/12/2017   HDL 53 11/12/2017   LDLCALC 83 11/12/2017   TRIG 198 (H) 11/12/2017   CHOLHDL 3.1 11/12/2017   She has been working on diet and exercise for Diabetes with diabetic chronic kidney disease, she is on bASA, she is on ACE/ARB,  follows with Dr. Chalmers Cater, follows in Sept and denies increased appetite, nausea, paresthesia of the feet and polydipsia. She checks glucose religiously and fasting values run 110s-120s. Last A1C was:  Lab Results  Component Value Date   HGBA1C 6.6 (H) 11/12/2017   Last GFR: Lab Results  Component Value Date   GFRNONAA 63 11/12/2017   Patient is on Vitamin D  supplement.   Lab Results  Component Value Date   VD25OH 30 11/12/2017     Patient is on allopurinol for gout and does not report a recent flare.  Lab Results  Component Value Date   LABURIC 5.6 11/12/2017    Medication Review: Current Outpatient Medications on File Prior to Visit  Medication Sig Dispense Refill  . allopurinol (ZYLOPRIM) 300 MG tablet Take 0.5 tablets (150 mg total) by mouth daily. 90 tablet 1  . aspirin EC 81 MG tablet Take 81 mg by mouth daily.    Marland Kitchen atenolol (TENORMIN) 50 MG tablet TAKE 1 TABLET (50 MG TOTAL) BY MOUTH DAILY. 90 tablet 1  . cholecalciferol (VITAMIN D) 1000 UNITS tablet Take 5,000 Units by mouth every other day.     . citalopram (CELEXA) 20 MG tablet Take 1 tablet (20 mg total) by mouth daily. 90 tablet 2  . diltiazem (CARDIZEM CD) 240 MG 24 hr capsule TAKE 1 CAPSULE BY MOUTH ONCE DAILY FOR BLOOD PRESSURE 90 capsule 1  . fish oil-omega-3 fatty acids 1000 MG capsule Take 1 g by mouth daily.    Marland Kitchen FREESTYLE LITE test strip   12  . indapamide (LOZOL) 1.25 MG tablet TAKE ONE TABLET BY MOUTH TWICE DAILY FOR BLOOD PRESSURE AND FLUID 180 tablet 2  . Lancets (FREESTYLE) lancets   12  . lisinopril (PRINIVIL,ZESTRIL) 20 MG tablet TAKE ONE TABLET BY MOUTH ONCE DAILY FOR BLOOD PRESSURE. 90 tablet 2  . pravastatin (PRAVACHOL) 40 MG tablet TAKE 1 TABLET BY MOUTH DAILY. 90 tablet 1  . ranitidine (ZANTAC) 300 MG tablet TAKE ONE TABLET BY MOUTH ONCE DAILY AS NEEDED FOR  ACID  INDIGESTION 90 tablet 1  . allopurinol (ZYLOPRIM) 300 MG tablet TAKE 1 TABLET BY MOUTH ONCE DAILY TO PREVENT GOUT (Patient not taking: Reported on 03/07/2018) 90 tablet 2  . triamcinolone cream (KENALOG) 0.1 %   2   No current facility-administered medications on file prior to visit.     Allergies  Allergen Reactions  . Buspar [Buspirone]     dysphoria  . Lipitor [Atorvastatin]     Myalgias  . Loop Diuretics     Fatigue/weakness  . Metformin And Related     Gas/bloating  . Nsaids     GI  upset  . Sulfa Antibiotics Other (See Comments)    Reaction=throat itching  . Sulfonamide Derivatives     Current Problems (verified) Patient Active Problem List   Diagnosis Date Noted  . CKD stage 2 due to type 2 diabetes mellitus ([place]) 03/06/2018  . Overweight (BMI 25.0-29.9) 03/06/2018  . Osteopenia 03/06/2018  . Esophageal reflux 09/06/2015  . Atrial fibrillation ([place]) 03/17/2014  . Vitamin D deficiency 06/08/2013  . Medication management 06/08/2013  . Hypertension   . Hyperlipemia   . Gout   . T2_NIDDM w/CKD2 (GFR 67 ml/min)      Screening Tests Immunization History  Administered Date(s) Administered  . Influenza Split 07/13/2014  . Influenza, High Dose Seasonal PF 04/14/2015, 04/16/2017  . Pneumococcal-Unspecified 06/07/2003  . Td 06/02/2007, 11/12/2017  . Zoster 05/30/2012   Last colonoscopy: 12/2017 -  DONE Last mammogram: 02/2017, has appointment for next week DEXA:02/2017 osteopenia  Prior vaccinations: TD or Tdap: 2013  Influenza: 2018  Pneumococcal: 2008 Prevnar13: -  Shingles/Zostavax: 2013  Names of Other Physician/Practitioners you currently use: 1. [facility] here for primary care 2. Dr. Einar Gip, eye doctor, last visit 11/12/2017 - report received and abstracted 3. Dr. Donn Pierini, dentist, last visit 2019  Patient Care Team: Unk Pinto, MD as PCP - General (Internal Medicine) Jacelyn Pi, MD as Consulting Physician (Endocrinology)  SURGICAL HISTORY She  has a past surgical history that includes Knee arthroscopy; Abdominal hysterectomy; and Biopsy breast. FAMILY HISTORY Her family history includes CVA in her father; Diabetes in her mother; Hypertension in her father. SOCIAL HISTORY She  reports that she quit smoking about 11 years ago. She has never used smokeless tobacco. She reports that she drinks about 2.0 standard drinks of alcohol per week.   MEDICARE WELLNESS OBJECTIVES: Physical activity:  Current Exercise Habits: Home exercise routine, Type of exercise: walking, Time (Minutes): 30, Frequency (Times/Week): 7, Weekly Exercise (Minutes/Week): 210, Intensity: Mild, Exercise limited by: None identified Cardiac risk factors: Cardiac Risk Factors include: advanced age (>96men, >67 women);hypertension;dyslipidemia;diabetes mellitus Depression/mood screen:   Depression screen [facility] 2/9 03/07/2018  Decreased Interest 0  Down, Depressed, Hopeless 0  PHQ - 2 Score 0    ADLs:  In your present state of health, do you have any difficulty performing the following activities: 03/07/2018 11/12/2017  Hearing? N N  Vision? N N  Difficulty concentrating or making decisions? N N  Walking or climbing stairs? N N  Dressing or bathing? N N  Doing errands, shopping? N N  Some recent data might be hidden    Cognitive Testing  Alert? Yes  Normal Appearance?Yes  Oriented to person? Yes  Place? Yes   Time? Yes  Recall of three objects?  Yes  Can perform simple calculations? Yes  Displays appropriate judgment?Yes  Can read the correct time from a watch face?Yes  EOL planning: Does Patient Have a Medical Advance Directive?: Yes Type of Advance Directive: Healthcare Power of Attorney, Living will Does patient want to make changes to medical advance directive?: No - Patient declined Copy of [place] in Chart?: No - copy requested  Review of Systems  Constitutional: Negative.   HENT: Negative.   Eyes: Negative.   Respiratory: Negative.   Cardiovascular: Negative.   Gastrointestinal: Negative.   Genitourinary: Negative.   Musculoskeletal: Negative.   Skin: Negative.   Neurological: Negative.   Endo/Heme/Allergies: Negative.   Psychiatric/Behavioral: Negative.      Objective:     Today's Vitals   03/07/18 1110  BP: 122/70  Pulse: (!) 54  Temp: 97.9 F (36.6 C)  SpO2: 95%  Weight: 162 lb (73.5 kg)  Height: 5' 4.5" (1.638 m)   Body mass index is 27.38  kg/m.  General appearance: alert, no distress, WD/WN, female HEENT: normocephalic, sclerae anicteric, TMs pearly, nares patent, no discharge or erythema, pharynx normal Oral cavity: MMM, no lesions Neck: supple, no lymphadenopathy, no thyromegaly, no masses Heart: RRR, normal S1, S2, no murmurs Lungs: CTA bilaterally, no wheezes, rhonchi, or rales Abdomen: +bs, soft, non tender, non distended, no masses, no hepatomegaly, no splenomegaly Musculoskeletal: nontender, no swelling, no obvious deformity Extremities: no edema, no cyanosis, no clubbing Pulses: 2+ symmetric, upper and lower extremities, normal cap refill Neurological: alert, oriented x 3, CN2-12 intact, strength normal upper extremities and lower extremities, sensation normal throughout, DTRs 2+  throughout, no cerebellar signs, gait normal Psychiatric: normal affect, behavior normal, pleasant   Medicare Attestation I have personally reviewed: The patient's medical and social history Their use of alcohol, tobacco or illicit drugs Their current medications and supplements The patient's functional ability including ADLs,fall risks, home safety risks, cognitive, and hearing and visual impairment Diet and physical activities Evidence for depression or mood disorders  The patient's weight, height, BMI, and visual acuity have been recorded in the chart.  I have made referrals, counseling, and provided education to the patient based on review of the above and I have provided the patient with a written personalized care plan for preventive services.      Izora Ribas, NP   03/07/2018

## 2018-03-07 ENCOUNTER — Ambulatory Visit (INDEPENDENT_AMBULATORY_CARE_PROVIDER_SITE_OTHER): Payer: Medicare Other | Admitting: Adult Health

## 2018-03-07 ENCOUNTER — Encounter: Payer: Self-pay | Admitting: Adult Health

## 2018-03-07 VITALS — BP 122/70 | HR 54 | Temp 97.9°F | Ht 64.5 in | Wt 162.0 lb

## 2018-03-07 DIAGNOSIS — E663 Overweight: Secondary | ICD-10-CM | POA: Diagnosis not present

## 2018-03-07 DIAGNOSIS — K219 Gastro-esophageal reflux disease without esophagitis: Secondary | ICD-10-CM | POA: Diagnosis not present

## 2018-03-07 DIAGNOSIS — M1000 Idiopathic gout, unspecified site: Secondary | ICD-10-CM

## 2018-03-07 DIAGNOSIS — E782 Mixed hyperlipidemia: Secondary | ICD-10-CM | POA: Diagnosis not present

## 2018-03-07 DIAGNOSIS — N182 Chronic kidney disease, stage 2 (mild): Secondary | ICD-10-CM

## 2018-03-07 DIAGNOSIS — E1122 Type 2 diabetes mellitus with diabetic chronic kidney disease: Secondary | ICD-10-CM | POA: Diagnosis not present

## 2018-03-07 DIAGNOSIS — I480 Paroxysmal atrial fibrillation: Secondary | ICD-10-CM | POA: Diagnosis not present

## 2018-03-07 DIAGNOSIS — I10 Essential (primary) hypertension: Secondary | ICD-10-CM | POA: Diagnosis not present

## 2018-03-07 DIAGNOSIS — Z79899 Other long term (current) drug therapy: Secondary | ICD-10-CM

## 2018-03-07 DIAGNOSIS — E559 Vitamin D deficiency, unspecified: Secondary | ICD-10-CM

## 2018-03-07 DIAGNOSIS — Z0000 Encounter for general adult medical examination without abnormal findings: Secondary | ICD-10-CM

## 2018-03-07 DIAGNOSIS — M8589 Other specified disorders of bone density and structure, multiple sites: Secondary | ICD-10-CM

## 2018-03-07 NOTE — Patient Instructions (Addendum)
Know what a healthy weight is for you (roughly BMI <25) and aim to maintain this  Aim for 7+ servings of fruits and vegetables daily  65-80+ fluid ounces of water or unsweet tea for healthy kidneys  Limit to max 1 drink of alcohol per day; avoid smoking/tobacco  Limit animal fats in diet for cholesterol and heart health - choose grass fed whenever available  Avoid highly processed foods, and foods high in saturated/trans fats  Aim for low stress - take time to unwind and care for your mental health  Aim for 150 min of moderate intensity exercise weekly for heart health, and weights twice weekly for bone health  Aim for 7-9 hours of sleep daily       When it comes to diets, agreement about the perfect plan isn't easy to find, even among the experts. Experts at the [employer] developed an idea known as the Healthy Eating Plate. Just imagine a plate divided into logical, healthy portions.  The emphasis is on diet quality:  Load up on vegetables and fruits - one-half of your plate: Aim for color and variety, and remember that potatoes don't count.  Go for whole grains - one-quarter of your plate: Whole wheat, barley, wheat berries, quinoa, oats, brown rice, and foods made with them. If you want pasta, go with whole wheat pasta.  Protein power - one-quarter of your plate: Fish, chicken, beans, and nuts are all healthy, versatile protein sources. Limit red meat.  The diet, however, does go beyond the plate, offering a few other suggestions.  Use healthy plant oils, such as olive, canola, soy, corn, sunflower and peanut. Check the labels, and avoid partially hydrogenated oil, which have unhealthy trans fats.  If you're thirsty, drink water. Coffee and tea are good in moderation, but skip sugary drinks and limit milk and dairy products to one or two daily servings.  The type of carbohydrate in the diet is more important than the amount. Some sources of  carbohydrates, such as vegetables, fruits, whole grains, and beans-are healthier than others.  Finally, stay active.     

## 2018-03-08 LAB — COMPLETE METABOLIC PANEL WITH GFR
AG Ratio: 1.7 (calc) (ref 1.0–2.5)
ALBUMIN MSPROF: 4.4 g/dL (ref 3.6–5.1)
ALKALINE PHOSPHATASE (APISO): 47 U/L (ref 33–130)
ALT: 17 U/L (ref 6–29)
AST: 20 U/L (ref 10–35)
BILIRUBIN TOTAL: 0.7 mg/dL (ref 0.2–1.2)
BUN / CREAT RATIO: 24 (calc) — AB (ref 6–22)
BUN: 22 mg/dL (ref 7–25)
CHLORIDE: 102 mmol/L (ref 98–110)
CO2: 30 mmol/L (ref 20–32)
Calcium: 9.7 mg/dL (ref 8.6–10.4)
Creat: 0.93 mg/dL — ABNORMAL HIGH (ref 0.60–0.88)
GFR, Est African American: 67 mL/min/{1.73_m2} (ref >=60)
GFR, Est Non African American: 58 mL/min/{1.73_m2} — ABNORMAL LOW (ref >=60)
Globulin: 2.6 g/dL (calc) (ref 1.9–3.7)
Glucose, Bld: 113 mg/dL — ABNORMAL HIGH (ref 65–99)
Potassium: 4.3 mmol/L (ref 3.5–5.3)
Sodium: 141 mmol/L (ref 135–146)
Total Protein: 7 g/dL (ref 6.1–8.1)

## 2018-03-08 LAB — CBC WITH DIFFERENTIAL/PLATELET
BASOS ABS: 67 {cells}/uL (ref 0–200)
Basophils Relative: 0.9 %
Eosinophils Absolute: 89 cells/uL (ref 15–500)
Eosinophils Relative: 1.2 %
HEMATOCRIT: 41.8 % (ref 35.0–45.0)
Hemoglobin: 14 g/dL (ref 11.7–15.5)
LYMPHS ABS: 2546 {cells}/uL (ref 850–3900)
MCH: 32.5 pg (ref 27.0–33.0)
MCHC: 33.5 g/dL (ref 32.0–36.0)
MCV: 97 fL (ref 80.0–100.0)
MONOS PCT: 7.2 %
MPV: 11 fL (ref 7.5–12.5)
NEUTROS PCT: 56.3 %
Neutro Abs: 4166 cells/uL (ref 1500–7800)
PLATELETS: 185 10*3/uL (ref 140–400)
RBC: 4.31 10*6/uL (ref 3.80–5.10)
RDW: 12.7 % (ref 11.0–15.0)
TOTAL LYMPHOCYTE: 34.4 %
WBC mixed population: 533 cells/uL (ref 200–950)
WBC: 7.4 10*3/uL (ref 3.8–10.8)

## 2018-03-08 LAB — LIPID PANEL
CHOLESTEROL: 164 mg/dL (ref <200)
HDL: 57 mg/dL (ref >50)
LDL Cholesterol (Calc): 82 mg/dL (calc)
Non-HDL Cholesterol (Calc): 107 mg/dL (calc) (ref <130)
Total CHOL/HDL Ratio: 2.9 (calc) (ref <5.0)
Triglycerides: 153 mg/dL — ABNORMAL HIGH (ref <150)

## 2018-03-08 LAB — TSH: TSH: 1.72 mIU/L (ref 0.40–4.50)

## 2018-03-08 LAB — MAGNESIUM: MAGNESIUM: 2 mg/dL (ref 1.5–2.5)

## 2018-03-08 LAB — VITAMIN D 25 HYDROXY (VIT D DEFICIENCY, FRACTURES): Vit D, 25-Hydroxy: 34 ng/mL (ref 30–100)

## 2018-03-10 DIAGNOSIS — Z1231 Encounter for screening mammogram for malignant neoplasm of breast: Secondary | ICD-10-CM | POA: Diagnosis not present

## 2018-03-10 LAB — HM MAMMOGRAPHY

## 2018-04-03 ENCOUNTER — Encounter: Payer: Self-pay | Admitting: Internal Medicine

## 2018-04-08 ENCOUNTER — Other Ambulatory Visit: Payer: Self-pay | Admitting: Adult Health

## 2018-04-18 ENCOUNTER — Other Ambulatory Visit: Payer: Self-pay | Admitting: Physician Assistant

## 2018-04-24 DIAGNOSIS — I10 Essential (primary) hypertension: Secondary | ICD-10-CM | POA: Diagnosis not present

## 2018-04-24 DIAGNOSIS — E1165 Type 2 diabetes mellitus with hyperglycemia: Secondary | ICD-10-CM | POA: Diagnosis not present

## 2018-04-24 DIAGNOSIS — E7800 Pure hypercholesterolemia, unspecified: Secondary | ICD-10-CM | POA: Diagnosis not present

## 2018-05-13 DIAGNOSIS — L280 Lichen simplex chronicus: Secondary | ICD-10-CM | POA: Diagnosis not present

## 2018-06-13 ENCOUNTER — Encounter: Payer: Self-pay | Admitting: Internal Medicine

## 2018-06-13 ENCOUNTER — Ambulatory Visit (INDEPENDENT_AMBULATORY_CARE_PROVIDER_SITE_OTHER): Payer: Medicare Other | Admitting: Internal Medicine

## 2018-06-13 VITALS — BP 110/64 | HR 66 | Temp 96.8°F | Ht 64.5 in | Wt 162.0 lb

## 2018-06-13 DIAGNOSIS — M1000 Idiopathic gout, unspecified site: Secondary | ICD-10-CM | POA: Diagnosis not present

## 2018-06-13 DIAGNOSIS — I10 Essential (primary) hypertension: Secondary | ICD-10-CM | POA: Diagnosis not present

## 2018-06-13 DIAGNOSIS — E782 Mixed hyperlipidemia: Secondary | ICD-10-CM | POA: Diagnosis not present

## 2018-06-13 DIAGNOSIS — E1122 Type 2 diabetes mellitus with diabetic chronic kidney disease: Secondary | ICD-10-CM | POA: Diagnosis not present

## 2018-06-13 DIAGNOSIS — Z79899 Other long term (current) drug therapy: Secondary | ICD-10-CM | POA: Diagnosis not present

## 2018-06-13 DIAGNOSIS — I480 Paroxysmal atrial fibrillation: Secondary | ICD-10-CM | POA: Diagnosis not present

## 2018-06-13 DIAGNOSIS — Z23 Encounter for immunization: Secondary | ICD-10-CM

## 2018-06-13 DIAGNOSIS — E559 Vitamin D deficiency, unspecified: Secondary | ICD-10-CM | POA: Diagnosis not present

## 2018-06-13 DIAGNOSIS — N183 Chronic kidney disease, stage 3 (moderate): Secondary | ICD-10-CM

## 2018-06-13 NOTE — Patient Instructions (Signed)
Recommend Adult Low Dose Aspirin or  coated  Aspirin 81 mg daily  To reduce risk of Colon Cancer 20 %,  Skin Cancer 26 % ,  Melanoma 46%  and  Pancreatic cancer 60% +++++++++++++++++++++++++ Vitamin D goal  is between 70-100.  Please make sure that you are taking your Vitamin D as directed.  It is very important as a natural anti-inflammatory  helping hair, skin, and nails, as well as reducing stroke and heart attack risk.  It helps your bones and helps with mood. It also decreases numerous cancer risks so please take it as directed.  Low Vit D is associated with a 200-300% higher risk for CANCER  and 200-300% higher risk for HEART   ATTACK  &  STROKE.   ...................................... It is also associated with higher death rate at younger ages,  autoimmune diseases like Rheumatoid arthritis, Lupus, Multiple Sclerosis.    Also many other serious conditions, like depression, Alzheimer's Dementia, infertility, muscle aches, fatigue, fibromyalgia - just to name a few. ++++++++++++++++++++ Recommend the book "The END of DIETING" by Dr Joel Fuhrman  & the book "The END of DIABETES " by Dr Joel Fuhrman At [employer] - get book & Audio CD's    Being diabetic has a  300% increased risk for heart attack, stroke, cancer, and alzheimer- type vascular dementia. It is very important that you work harder with diet by avoiding all foods that are white. Avoid white rice (brown & wild rice is OK), white potatoes (sweetpotatoes in moderation is OK), White bread or wheat bread or anything made out of white flour like bagels, donuts, rolls, buns, biscuits, cakes, pastries, cookies, pizza crust, and pasta (made from white flour & egg whites) - vegetarian pasta or spinach or wheat pasta is OK. Multigrain breads like Arnold's or Pepperidge [employer], or multigrain sandwich thins or flatbreads.  Diet, exercise and weight loss can reverse and cure diabetes in the early stages.  Diet, exercise and weight loss is  very important in the control and prevention of complications of diabetes which affects every system in your body, ie. Brain - dementia/stroke, eyes - glaucoma/blindness, heart - heart attack/heart failure, kidneys - dialysis, stomach - gastric paralysis, intestines - malabsorption, nerves - severe painful neuritis, circulation - gangrene & loss of a leg(s), and finally cancer and Alzheimers.    I recommend avoid fried & greasy foods,  sweets/candy, white rice (brown or wild rice or Quinoa is OK), white potatoes (sweet potatoes are OK) - anything made from white flour - bagels, doughnuts, rolls, buns, biscuits,white and wheat breads, pizza crust and traditional pasta made of white flour & egg white(vegetarian pasta or spinach or wheat pasta is OK).  Multi-grain bread is OK - like multi-grain flat bread or sandwich thins. Avoid alcohol in excess. Exercise is also important.    Eat all the vegetables you want - avoid meat, especially red meat and dairy - especially cheese.  Cheese is the most concentrated form of trans-fats which is the worst thing to clog up our arteries. Veggie cheese is OK which can be found in the fresh produce section at [employer] or [employer] or [employer]  +++++++++++++++++++++ DASH Eating Plan  DASH stands for "Dietary Approaches to Stop Hypertension."   The DASH eating plan is a healthy eating plan that has been shown to reduce high blood pressure (hypertension). Additional health benefits may include reducing the risk of type 2 diabetes mellitus, heart disease, and stroke. The DASH eating plan may also   help with weight loss. WHAT DO I NEED TO KNOW ABOUT THE DASH EATING PLAN? For the DASH eating plan, you will follow these general guidelines:  Choose foods with a percent daily value for sodium of less than 5% (as listed on the food label).  Use salt-free seasonings or herbs instead of table salt or sea salt.  Check with your health care provider or pharmacist before  using salt substitutes.  Eat lower-sodium products, often labeled as "lower sodium" or "no salt added."  Eat fresh foods.  Eat more vegetables, fruits, and low-fat dairy products.  Choose whole grains. Look for the word "whole" as the first word in the ingredient list.  Choose fish   Limit sweets, desserts, sugars, and sugary drinks.  Choose heart-healthy fats.  Eat veggie cheese   Eat more home-cooked food and less restaurant, buffet, and fast food.  Limit fried foods.  [place] foods using methods other than frying.  Limit canned vegetables. If you do use them, rinse them well to decrease the sodium.  When eating at a restaurant, ask that your food be prepared with less salt, or no salt if possible.                      WHAT FOODS CAN I EAT? Read Dr Joel Fuhrman's books on The End of Dieting & The End of Diabetes  Grains Whole grain or whole wheat bread. Brown rice. Whole grain or whole wheat pasta. Quinoa, bulgur, and whole grain cereals. Low-sodium cereals. Corn or whole wheat flour tortillas. Whole grain cornbread. Whole grain crackers. Low-sodium crackers.  Vegetables Fresh or frozen vegetables (raw, steamed, roasted, or grilled). Low-sodium or reduced-sodium tomato and vegetable juices. Low-sodium or reduced-sodium tomato sauce and paste. Low-sodium or reduced-sodium canned vegetables.   Fruits All fresh, canned (in natural juice), or frozen fruits.  Protein Products  All fish and seafood.  Dried beans, peas, or lentils. Unsalted nuts and seeds. Unsalted canned beans.  Dairy Low-fat dairy products, such as skim or 1% milk, 2% or reduced-fat cheeses, low-fat ricotta or cottage cheese, or plain low-fat yogurt. Low-sodium or reduced-sodium cheeses.  Fats and Oils Tub margarines without trans fats. Light or reduced-fat mayonnaise and salad dressings (reduced sodium). Avocado. Safflower, olive, or canola oils. Natural peanut or almond butter.  Other Unsalted popcorn  and pretzels. The items listed above may not be a complete list of recommended foods or beverages. Contact your dietitian for more options.  +++++++++++++++  WHAT FOODS ARE NOT RECOMMENDED? Grains/ White flour or wheat flour White bread. White pasta. White rice. Refined cornbread. Bagels and croissants. Crackers that contain trans fat.  Vegetables  Creamed or fried vegetables. Vegetables in a . Regular canned vegetables. Regular canned tomato sauce and paste. Regular tomato and vegetable juices.  Fruits Dried fruits. Canned fruit in light or heavy syrup. Fruit juice.  Meat and Other Protein Products Meat in general - RED meat & White meat.  Fatty cuts of meat. Ribs, chicken wings, all processed meats as bacon, sausage, bologna, salami, fatback, hot dogs, bratwurst and packaged luncheon meats.  Dairy Whole or 2% milk, cream, half-and-half, and cream cheese. Whole-fat or sweetened yogurt. Full-fat cheeses or blue cheese. Non-dairy creamers and whipped toppings. Processed cheese, cheese spreads, or cheese curds.  Condiments Onion and garlic salt, seasoned salt, table salt, and sea salt. Canned and packaged gravies. Worcestershire sauce. Tartar sauce. Barbecue sauce. Teriyaki sauce. Soy sauce, including reduced sodium. Steak sauce. Fish sauce. Oyster sauce. Cocktail   sauce. Horseradish. Ketchup and mustard. Meat flavorings and tenderizers. Bouillon cubes. Hot sauce. Tabasco sauce. Marinades. Taco seasonings. Relishes.  Fats and Oils Butter, stick margarine, lard, shortening and bacon fat. Coconut, palm kernel, or palm oils. Regular salad dressings.  Pickles and olives. Salted popcorn and pretzels.  The items listed above may not be a complete list of foods and beverages to avoid.   

## 2018-06-13 NOTE — Progress Notes (Signed)
This very nice 83 y.o. WWF presents for 6 month follow up with HTN, HLD, T2_DM/CKD3,  and Vitamin D Deficiency. Her Gout & GERD is controlled w/her meds.      Patient is treated for HTN (1991) & BP has been controlled at home. Today's BP is at goal - 110/64. Patient has hx/o pAfib (CHADsVASc 5) only on LD bASA 81 mg.  Patient has had no complaints of any cardiac type chest pain, palpitations, dyspnea / orthopnea / PND, dizziness, claudication, or dependent edema. Patient has hx/o Gout and has been asymptomatic off of her Allopurinol.      Hyperlipidemia is controlled with diet & meds. Patient denies myalgias or other med SE's. Last Lipids were  Lab Results  Component Value Date   CHOL 164 03/07/2018   HDL 57 03/07/2018   LDLCALC 82 03/07/2018   TRIG 153 (H) 03/07/2018   CHOLHDL 2.9 03/07/2018      Also, the patient has history of T2_NIDDM (2007) w/CKD3 (GFR 58) and has had no symptoms of reactive hypoglycemia, diabetic polys, paresthesias or visual blurring. She has attempted control controlled her Diabetes with diet.  She is intolerant of Metformin due to severe diarrhea Last A1c was not at goal: Lab Results  Component Value Date   HGBA1C 6.6 (H) 11/12/2017      Further, the patient also has history of Vitamin D Deficiency ("29" / 2008)  and supplements vitamin D sporadically. Last vitamin D was very low & not at goal: Lab Results  Component Value Date   VD25OH 34 03/07/2018   Current Outpatient Medications on File Prior to Visit  Medication Sig  . allopurinol (ZYLOPRIM) 300 MG tablet Take 0.5 tablets (150 mg total) by mouth daily.  Marland Kitchen aspirin EC 81 MG tablet Take 81 mg by mouth daily.  Marland Kitchen atenolol (TENORMIN) 50 MG tablet TAKE 1 TABLET (50 MG TOTAL) BY MOUTH DAILY.  . cholecalciferol (VITAMIN D) 1000 UNITS tablet Take 5,000 Units by mouth every other day.   . citalopram (CELEXA) 20 MG tablet Take 1 tablet (20 mg total) by mouth daily.  Marland Kitchen diltiazem (CARDIZEM CD) 240 MG 24 hr  capsule TAKE 1 CAPSULE BY MOUTH ONCE DAILY FOR BLOOD PRESSURE  . fish oil-omega-3 fatty acids 1000 MG capsule Take 1 g by mouth daily.  Marland Kitchen FREESTYLE LITE test strip   . indapamide (LOZOL) 1.25 MG tablet TAKE ONE TABLET BY MOUTH TWICE DAILY FOR BLOOD PRESSURE AND FLUID  . Lancets (FREESTYLE) lancets   . lisinopril (PRINIVIL,ZESTRIL) 20 MG tablet TAKE ONE TABLET BY MOUTH ONCE DAILY FOR BLOOD PRESSURE.  . pravastatin (PRAVACHOL) 40 MG tablet TAKE 1 TABLET BY MOUTH DAILY.  . ranitidine (ZANTAC) 300 MG tablet TAKE ONE TABLET BY MOUTH ONCE DAILY AS NEEDED FOR  ACID  INDIGESTION  . triamcinolone cream (KENALOG) 0.1 % as needed.   Marland Kitchen allopurinol (ZYLOPRIM) 300 MG tablet TAKE 1 TABLET BY MOUTH ONCE DAILY TO PREVENT GOUT (Patient not taking: Reported on 03/07/2018)   No current facility-administered medications on file prior to visit.    Allergies  Allergen Reactions  . Buspar [Buspirone]     dysphoria  . Lipitor [Atorvastatin]     Myalgias  . Loop Diuretics     Fatigue/weakness  . Metformin And Related     Gas/bloating  . Nsaids     GI upset  . Sulfa Antibiotics Other (See Comments)    Reaction=throat itching  . Sulfonamide Derivatives    PMHx:  Past Medical History:  Diagnosis Date  . A-fib ([place])   . Anxiety   . Arthritis   . Diabetes mellitus   . Gout   . Hyperlipemia   . Hypertension    Immunization History  Administered Date(s) Administered  . Influenza Split 07/13/2014  . Influenza, High Dose Seasonal PF 04/14/2015, 04/16/2017  . Pneumococcal-Unspecified 06/07/2003  . Td 06/02/2007, 11/12/2017  . Zoster 05/30/2012   Past Surgical History:  Procedure Laterality Date  . ABDOMINAL HYSTERECTOMY    . BIOPSY BREAST     (L) BREAST IN 1993  . KNEE ARTHROSCOPY     FHx:    Reviewed / unchanged  SHx:    Reviewed / unchanged   Systems Review:  Constitutional: Denies fever, chills, wt changes, headaches, insomnia, fatigue, night sweats, change in appetite. Eyes: Denies  redness, blurred vision, diplopia, discharge, itchy, watery eyes.  ENT: Denies discharge, congestion, post nasal drip, epistaxis, sore throat, earache, hearing loss, dental pain, tinnitus, vertigo, sinus pain, snoring.  CV: Denies chest pain, palpitations, irregular heartbeat, syncope, dyspnea, diaphoresis, orthopnea, PND, claudication or edema. Respiratory: denies cough, dyspnea, DOE, pleurisy, hoarseness, laryngitis, wheezing.  Gastrointestinal: Denies dysphagia, odynophagia, heartburn, reflux, water brash, abdominal pain or cramps, nausea, vomiting, bloating, diarrhea, constipation, hematemesis, melena, hematochezia  or hemorrhoids. Genitourinary: Denies dysuria, frequency, urgency, nocturia, hesitancy, discharge, hematuria or flank pain. Musculoskeletal: Denies arthralgias, myalgias, stiffness, jt. swelling, pain, limping or strain/sprain.  Skin: Denies pruritus, rash, hives, warts, acne, eczema or change in skin lesion(s). Neuro: No weakness, tremor, incoordination, spasms, paresthesia or pain. Psychiatric: Denies confusion, memory loss or sensory loss. Endo: Denies change in weight, skin or hair change.  Heme/Lymph: No excessive bleeding, bruising or enlarged lymph nodes.  Physical Exam  BP 110/64   Pulse 66   Temp (!) 96.8 F (36 C)   Ht 5' 4.5" (1.638 m)   Wt 162 lb (73.5 kg)   SpO2 96%   BMI 27.38 kg/m   Appears  well nourished, well groomed  and in no distress.  Eyes: PERRLA, EOMs, conjunctiva no swelling or erythema. Sinuses: No frontal/maxillary tenderness ENT/Mouth: EAC's clear, TM's nl w/o erythema, bulging. Nares clear w/o erythema, swelling, exudates. Oropharynx clear without erythema or exudates. Oral hygiene is good. Tongue normal, non obstructing. Hearing intact.  Neck: Supple. Thyroid not palpable. Car 2+/2+ without bruits, nodes or JVD. Chest: Respirations nl with BS clear & equal w/o rales, rhonchi, wheezing or stridor.  Cor: Heart sounds normal w/ regular rate  and rhythm without sig. murmurs, gallops, clicks or rubs. Peripheral pulses normal and equal  without edema.  Abdomen: Soft & bowel sounds normal. Non-tender w/o guarding, rebound, hernias, masses or organomegaly.  Lymphatics: Unremarkable.  Musculoskeletal: Full ROM all peripheral extremities, joint stability, 5/5 strength and normal gait.  Skin: Warm, dry without exposed rashes, lesions or ecchymosis apparent.  Neuro: Cranial nerves intact, reflexes equal bilaterally. Sensory-motor testing grossly intact. Tendon reflexes grossly intact.  Pysch: Alert & oriented x 3.  Insight and judgement nl & appropriate. No ideations.  Assessment and Plan:  1. Essential hypertension  - Continue medication, monitor blood pressure at home.  - Continue DASH diet.  Reminder to go to the ER if any CP,  SOB, nausea, dizziness, severe HA, changes vision/speech.  - CBC with Differential/Platelet - COMPLETE METABOLIC PANEL WITH GFR - Magnesium - TSH  2. Hyperlipidemia, mixed  - Continue diet/meds, exercise,& lifestyle modifications.  - Continue monitor periodic cholesterol/liver & renal functions   - Lipid panel -  TSH  3. Type 2 diabetes mellitus with stage 3 chronic kidney disease, without long-term current use of insulin (HCC)  - Continue diet, exercise,  - lifestyle modifications.  - Monitor appropriate labs.  - Hemoglobin A1c - Insulin, random  4. Vitamin D deficiency  - Continue supplementation.  - VITAMIN D 25 Hydroxyl  5. Idiopathic gout  - Uric acid  6. Medication management  - CBC with Differential/Platelet - COMPLETE METABOLIC PANEL WITH GFR - Magnesium - Lipid panel - TSH - Hemoglobin A1c - Insulin, random - VITAMIN D 25 Hydroxyl - Uric acid  7. Need for immunization against influenza          Discussed  regular exercise, BP monitoring, weight control to achieve/maintain BMI less than 25 and discussed med and SE's. Recommended labs to assess and monitor clinical  status with further disposition pending results of labs. Over 30 minutes of exam, counseling, chart review was performed.

## 2018-06-15 ENCOUNTER — Encounter: Payer: Self-pay | Admitting: Internal Medicine

## 2018-06-16 ENCOUNTER — Telehealth: Payer: Self-pay | Admitting: Internal Medicine

## 2018-06-16 LAB — LIPID PANEL
Cholesterol: 153 mg/dL (ref <200)
HDL: 58 mg/dL (ref >50)
LDL CHOLESTEROL (CALC): 70 mg/dL
Non-HDL Cholesterol (Calc): 95 mg/dL (calc) (ref <130)
TRIGLYCERIDES: 168 mg/dL — AB (ref <150)
Total CHOL/HDL Ratio: 2.6 (calc) (ref <5.0)

## 2018-06-16 LAB — COMPLETE METABOLIC PANEL WITH GFR
AG Ratio: 1.5 (calc) (ref 1.0–2.5)
ALT: 15 U/L (ref 6–29)
AST: 21 U/L (ref 10–35)
Albumin: 4.1 g/dL (ref 3.6–5.1)
Alkaline phosphatase (APISO): 46 U/L (ref 33–130)
BILIRUBIN TOTAL: 0.6 mg/dL (ref 0.2–1.2)
BUN/Creatinine Ratio: 20 (calc) (ref 6–22)
BUN: 19 mg/dL (ref 7–25)
CALCIUM: 9.3 mg/dL (ref 8.6–10.4)
CHLORIDE: 99 mmol/L (ref 98–110)
CO2: 29 mmol/L (ref 20–32)
Creat: 0.94 mg/dL — ABNORMAL HIGH (ref 0.60–0.88)
GFR, EST AFRICAN AMERICAN: 66 mL/min/{1.73_m2} (ref >=60)
GFR, Est Non African American: 57 mL/min/{1.73_m2} — ABNORMAL LOW (ref >=60)
GLUCOSE: 101 mg/dL — AB (ref 65–99)
Globulin: 2.7 g/dL (calc) (ref 1.9–3.7)
Potassium: 3.9 mmol/L (ref 3.5–5.3)
Sodium: 138 mmol/L (ref 135–146)
TOTAL PROTEIN: 6.8 g/dL (ref 6.1–8.1)

## 2018-06-16 LAB — CBC WITH DIFFERENTIAL/PLATELET
Basophils Absolute: 61 cells/uL (ref 0–200)
Basophils Relative: 0.9 %
EOS PCT: 1.5 %
Eosinophils Absolute: 102 cells/uL (ref 15–500)
HCT: 40.2 % (ref 35.0–45.0)
Hemoglobin: 13.8 g/dL (ref 11.7–15.5)
Lymphs Abs: 2468 cells/uL (ref 850–3900)
MCH: 32.8 pg (ref 27.0–33.0)
MCHC: 34.3 g/dL (ref 32.0–36.0)
MCV: 95.5 fL (ref 80.0–100.0)
MONOS PCT: 6.3 %
MPV: 10.9 fL (ref 7.5–12.5)
Neutro Abs: 3740 cells/uL (ref 1500–7800)
Neutrophils Relative %: 55 %
PLATELETS: 189 10*3/uL (ref 140–400)
RBC: 4.21 10*6/uL (ref 3.80–5.10)
RDW: 12.7 % (ref 11.0–15.0)
Total Lymphocyte: 36.3 %
WBC mixed population: 428 cells/uL (ref 200–950)
WBC: 6.8 10*3/uL (ref 3.8–10.8)

## 2018-06-16 LAB — TSH: TSH: 1.25 mIU/L (ref 0.40–4.50)

## 2018-06-16 LAB — HEMOGLOBIN A1C
HEMOGLOBIN A1C: 6.6 %{Hb} — AB (ref <5.7)
Mean Plasma Glucose: 143 (calc)
eAG (mmol/L): 7.9 (calc)

## 2018-06-16 LAB — INSULIN, RANDOM: Insulin: 3.9 u[IU]/mL (ref 2.0–19.6)

## 2018-06-16 LAB — MAGNESIUM: MAGNESIUM: 1.7 mg/dL (ref 1.5–2.5)

## 2018-06-16 LAB — VITAMIN D 25 HYDROXY (VIT D DEFICIENCY, FRACTURES): VIT D 25 HYDROXY: 35 ng/mL (ref 30–100)

## 2018-06-16 LAB — URIC ACID: URIC ACID, SERUM: 4.5 mg/dL (ref 2.5–7.0)

## 2018-06-16 NOTE — Telephone Encounter (Signed)
returned your lab result call. Please call

## 2018-07-02 ENCOUNTER — Other Ambulatory Visit: Payer: Self-pay

## 2018-07-02 MED ORDER — ATENOLOL 50 MG PO TABS: 50.0000 mg | ORAL_TABLET | Freq: Every day | ORAL | 1 refills | Status: DC

## 2018-07-22 ENCOUNTER — Other Ambulatory Visit: Payer: Self-pay | Admitting: Physician Assistant

## 2018-07-22 ENCOUNTER — Other Ambulatory Visit: Payer: Self-pay | Admitting: Adult Health

## 2018-07-28 DIAGNOSIS — H524 Presbyopia: Secondary | ICD-10-CM | POA: Diagnosis not present

## 2018-07-28 DIAGNOSIS — H11159 Pinguecula, unspecified eye: Secondary | ICD-10-CM | POA: Diagnosis not present

## 2018-07-28 DIAGNOSIS — H5203 Hypermetropia, bilateral: Secondary | ICD-10-CM | POA: Diagnosis not present

## 2018-07-28 DIAGNOSIS — H25099 Other age-related incipient cataract, unspecified eye: Secondary | ICD-10-CM | POA: Diagnosis not present

## 2018-07-28 DIAGNOSIS — H52223 Regular astigmatism, bilateral: Secondary | ICD-10-CM | POA: Diagnosis not present

## 2018-07-28 DIAGNOSIS — I10 Essential (primary) hypertension: Secondary | ICD-10-CM | POA: Diagnosis not present

## 2018-07-28 DIAGNOSIS — E119 Type 2 diabetes mellitus without complications: Secondary | ICD-10-CM | POA: Diagnosis not present

## 2018-08-13 DIAGNOSIS — L819 Disorder of pigmentation, unspecified: Secondary | ICD-10-CM | POA: Diagnosis not present

## 2018-08-13 DIAGNOSIS — D225 Melanocytic nevi of trunk: Secondary | ICD-10-CM | POA: Diagnosis not present

## 2018-08-13 DIAGNOSIS — L821 Other seborrheic keratosis: Secondary | ICD-10-CM | POA: Diagnosis not present

## 2018-08-13 DIAGNOSIS — L570 Actinic keratosis: Secondary | ICD-10-CM | POA: Diagnosis not present

## 2018-09-22 NOTE — Progress Notes (Signed)
FOLLOW UP  Assessment and Plan:   A. Fib NSR today; rate controlled on cardizem without anticoagulant; risks have been discussed with patient- she has declined therapy Emphasized to present to ER for new symptoms   Hypertension Well controlled with current medications Monitor blood pressure at home; patient to call if consistently greater than 130/80 Continue DASH diet.   Reminder to go to the ER if any CP, SOB, nausea, dizziness, severe HA, changes vision/speech, left arm numbness and tingling and jaw pain.  Cholesterol Continue medication: prevastatin Continue low cholesterol diet and exercise.  Check lipid panel.   diabetes Disease risks discussed Continue diet and exercise.  Perform daily foot/skin check, notify office of any concerning changes.  Check A1C  GERD Switched to protonix, will try to take as needed Diet discussed  Overweight She has been losing weight steadily and is nearly within goal range but has gained weight since last visit - has been eating more sweets - will cut back down and cut sugar out again Recommended diet heavy in fruits and veggies and low in animal meats, cheeses, and dairy products, appropriate calorie intake Discussed appropriate weight for height   Continue diet and meds as discussed. Further disposition pending results of labs. Discussed med's effects and SE's.   Over 30 minutes of exam, counseling, chart review, and critical decision making was performed.   Future Appointments  Date Time Provider Council  01/01/2019 11:00 AM Unk Pinto, MD [facility] None  03/17/2019 11:15 AM Liane Comber, NP [facility] None    ----------------------------------------------------------------------------------------------------------------------  HPI 84 y.o. female  presents for 3 month follow up on a. Fib, hypertension, cholesterol, prediabetes, GERD, gout, weight and vitamin D deficiency.   She has a diagnosis of A. Fib rate  controlled by cardizem without episode in over 2 years; CHADSVASC score of 5 and risks have been discussed with the patient in the past; she has declined anticoagulant therapy, and has been instructed to present to ER for new symptoms.   BMI is Body mass index is 27.61 kg/m., she has been working on diet and exercise, but reports she has been splitting a dessert with her sweetheart she is dating the last year. She will stop having dessert so often.  Wt Readings from Last 3 Encounters:  09/23/18 163 lb 6.4 oz (74.1 kg)  06/13/18 162 lb (73.5 kg)  03/07/18 162 lb (73.5 kg)   she has a diagnosis of GERD was managed by ranitidine 300 mg daily but now requesting protonix since zantac was pulled from the market.   Her blood pressure has been controlled at home, today their BP is BP: 122/82  She does workout. She denies chest pain, shortness of breath, dizziness.   She is on cholesterol medication and denies myalgias. Her cholesterol is at goal. The cholesterol last visit was:   Lab Results  Component Value Date   CHOL 153 06/13/2018   HDL 58 06/13/2018   LDLCALC 70 06/13/2018   TRIG 168 (H) 06/13/2018   CHOLHDL 2.6 06/13/2018    She has been working on diet and exercise for diabetes, follows with Dr. Chalmers Cater, off medications, and denies increased appetite, nausea, paresthesia of the feet, polydipsia, polyuria, visual disturbances and vomiting. Last A1C in the office was:  Lab Results  Component Value Date   HGBA1C 6.6 (H) 06/13/2018   Patient is on Vitamin D supplement but remains below goal of 70 at the last visit:    Lab Results  Component Value Date  VD25OH 35 06/13/2018      Current Medications:  Current Outpatient Medications on File Prior to Visit  Medication Sig  . allopurinol (ZYLOPRIM) 300 MG tablet Take 0.5 tablets (150 mg total) by mouth daily.  Marland Kitchen allopurinol (ZYLOPRIM) 300 MG tablet TAKE 1 TABLET BY MOUTH ONCE DAILY TO PREVENT GOUT  . aspirin EC 81 MG tablet Take 81 mg by  mouth daily.  Marland Kitchen atenolol (TENORMIN) 50 MG tablet Take 1 tablet (50 mg total) by mouth daily.  . cholecalciferol (VITAMIN D) 1000 UNITS tablet Take 5,000 Units by mouth every other day.   . citalopram (CELEXA) 20 MG tablet TAKE 1 TABLET (20 MG TOTAL) BY MOUTH DAILY.  Marland Kitchen diltiazem (CARDIZEM CD) 240 MG 24 hr capsule TAKE 1 CAPSULE BY MOUTH ONCE DAILY FOR BLOOD PRESSURE  . fish oil-omega-3 fatty acids 1000 MG capsule Take 1 g by mouth daily.  Marland Kitchen FREESTYLE LITE test strip   . indapamide (LOZOL) 1.25 MG tablet TAKE 1 TABLET BY MOUTH TWICE DAILY FOR BLOOD PRESSURE AND FLUID  . Lancets (FREESTYLE) lancets   . lisinopril (PRINIVIL,ZESTRIL) 20 MG tablet TAKE ONE TABLET BY MOUTH ONCE DAILY FOR BLOOD PRESSURE.  . pravastatin (PRAVACHOL) 40 MG tablet TAKE 1 TABLET BY MOUTH DAILY.  Marland Kitchen triamcinolone cream (KENALOG) 0.1 % as needed.    No current facility-administered medications on file prior to visit.      Allergies:  Allergies  Allergen Reactions  . Buspar [Buspirone]     dysphoria  . Lipitor [Atorvastatin]     Myalgias  . Loop Diuretics     Fatigue/weakness  . Metformin And Related     Gas/bloating  . Nsaids     GI upset  . Sulfa Antibiotics Other (See Comments)    Reaction=throat itching  . Sulfonamide Derivatives      Medical History:  Past Medical History:  Diagnosis Date  . A-fib ([place])   . Anxiety   . Arthritis   . Diabetes mellitus   . Gout   . Hyperlipemia   . Hypertension    Family history- Reviewed and unchanged Social history- Reviewed and unchanged   Review of Systems:  Review of Systems  Constitutional: Negative for malaise/fatigue and weight loss.  HENT: Negative for hearing loss and tinnitus.   Eyes: Negative for blurred vision and double vision.  Respiratory: Negative for cough, shortness of breath and wheezing.   Cardiovascular: Negative for chest pain, palpitations, orthopnea, claudication and leg swelling.  Gastrointestinal: Negative for abdominal pain,  blood in stool, constipation, diarrhea, heartburn, melena, nausea and vomiting.  Genitourinary: Negative.   Musculoskeletal: Negative for joint pain and myalgias.  Skin: Negative for rash.  Neurological: Negative for dizziness, tingling, sensory change, weakness and headaches.  Endo/Heme/Allergies: Negative for polydipsia.  Psychiatric/Behavioral: Negative.   All other systems reviewed and are negative.   Physical Exam: BP 122/82   Pulse 66   Temp (!) 97.3 F (36.3 C)   Wt 163 lb 6.4 oz (74.1 kg)   SpO2 95%   BMI 27.61 kg/m  Wt Readings from Last 3 Encounters:  09/23/18 163 lb 6.4 oz (74.1 kg)  06/13/18 162 lb (73.5 kg)  03/07/18 162 lb (73.5 kg)   General Appearance: Well nourished, in no apparent distress. Eyes: PERRLA, EOMs, conjunctiva no swelling or erythema Sinuses: No Frontal/maxillary tenderness ENT/Mouth: Ext aud canals clear, TMs without erythema, bulging. No erythema, swelling, or exudate on post pharynx.  Tonsils not swollen or erythematous. Hearing normal.  Neck: Supple, thyroid normal.  Respiratory: Respiratory effort normal, BS equal bilaterally without rales, rhonchi, wheezing or stridor.  Cardio: RRR with no MRGs. Brisk peripheral pulses without edema.  Abdomen: Soft, + BS.  Non tender, no guarding, rebound, hernias, masses. Lymphatics: Non tender without lymphadenopathy.  Musculoskeletal: Full ROM, 5/5 strength, Normal gait Skin: Warm, dry without rashes, lesions, ecchymosis.  Neuro: Cranial nerves intact. No cerebellar symptoms.  Psych: Awake and oriented X 3, normal affect, Insight and Judgment appropriate.    Vicie Mutters, PA-C 11:14 AM [facility]

## 2018-09-23 ENCOUNTER — Ambulatory Visit (INDEPENDENT_AMBULATORY_CARE_PROVIDER_SITE_OTHER): Payer: Medicare Other | Admitting: Physician Assistant

## 2018-09-23 VITALS — BP 122/82 | HR 66 | Temp 97.3°F | Wt 163.4 lb

## 2018-09-23 DIAGNOSIS — E782 Mixed hyperlipidemia: Secondary | ICD-10-CM | POA: Diagnosis not present

## 2018-09-23 DIAGNOSIS — I10 Essential (primary) hypertension: Secondary | ICD-10-CM

## 2018-09-23 DIAGNOSIS — E1122 Type 2 diabetes mellitus with diabetic chronic kidney disease: Secondary | ICD-10-CM | POA: Diagnosis not present

## 2018-09-23 DIAGNOSIS — N182 Chronic kidney disease, stage 2 (mild): Secondary | ICD-10-CM | POA: Diagnosis not present

## 2018-09-23 DIAGNOSIS — Z79899 Other long term (current) drug therapy: Secondary | ICD-10-CM

## 2018-09-23 DIAGNOSIS — E559 Vitamin D deficiency, unspecified: Secondary | ICD-10-CM | POA: Diagnosis not present

## 2018-09-23 MED ORDER — PANTOPRAZOLE SODIUM 40 MG PO TBEC: 40.0000 mg | DELAYED_RELEASE_TABLET | Freq: Every day | ORAL | 1 refills | Status: DC

## 2018-09-23 NOTE — Patient Instructions (Addendum)
Stroke Prevention Some medical conditions and behaviors are associated with a higher chance of having a stroke. You can help prevent a stroke by making nutrition, lifestyle, and other changes, including managing any medical conditions you may have. What nutrition changes can be made?  Eat healthy foods. You can do this by: ? Choosing foods high in fiber, such as fresh fruits and vegetables and whole grains. ? Eating at least 5 or more servings of fruits and vegetables a day. Try to fill half of your plate at each meal with fruits and vegetables. ? Choosing lean protein foods, such as lean cuts of meat, poultry without skin, fish, tofu, beans, and nuts. ? Eating low-fat dairy products. ? Avoiding foods that are high in salt (sodium). This can help lower blood pressure. ? Avoiding foods that have saturated fat, trans fat, and cholesterol. This can help prevent high cholesterol. ? Avoiding processed and premade foods.  Follow your health care provider's specific guidelines for losing weight, controlling high blood pressure (hypertension), lowering high cholesterol, and managing diabetes. These may include: ? Reducing your daily calorie intake. ? Limiting your daily sodium intake to 1,500 milligrams (mg). ? Using only healthy fats for cooking, such as olive oil, canola oil, or sunflower oil. ? Counting your daily carbohydrate intake. What lifestyle changes can be made?  Maintain a healthy weight. Talk to your health care provider about your ideal weight.  Get at least 30 minutes of moderate physical activity at least 5 days a week. Moderate activity includes brisk walking, biking, and swimming.  Do not use any products that contain nicotine or tobacco, such as cigarettes and e-cigarettes. If you need help quitting, ask your health care provider. It may also be helpful to avoid exposure to secondhand smoke.  Limit alcohol intake to no more than 1 drink a day for nonpregnant women and 2  drinks a day for men. One drink equals 12 oz of beer, 5 oz of wine, or 1 oz of hard liquor.  Stop any illegal drug use.  Avoid taking birth control pills. Talk to your health care provider about the risks of taking birth control pills if: ? You are over 90 years old. ? You smoke. ? You get migraines. ? You have ever had a blood clot. What other changes can be made?  Manage your cholesterol levels. ? Eating a healthy diet is important for preventing high cholesterol. If cholesterol cannot be managed through diet alone, you may also need to take medicines. ? Take any prescribed medicines to control your cholesterol as told by your health care provider.  Manage your diabetes. ? Eating a healthy diet and exercising regularly are important parts of managing your blood sugar. If your blood sugar cannot be managed through diet and exercise, you may need to take medicines. ? Take any prescribed medicines to control your diabetes as told by your health care provider.  Control your hypertension. ? To reduce your risk of stroke, try to keep your blood pressure below 130/80. ? Eating a healthy diet and exercising regularly are an important part of controlling your blood pressure. If your blood pressure cannot be managed through diet and exercise, you may need to take medicines. ? Take any prescribed medicines to control hypertension as told by your health care provider. ? Ask your health care provider if you should monitor your blood pressure at home. ? Have your blood pressure checked every year, even if your blood pressure is normal. Blood  pressure increases with age and some medical conditions.  Get evaluated for sleep disorders (sleep apnea). Talk to your health care provider about getting a sleep evaluation if you snore a lot or have excessive sleepiness.  Take over-the-counter and prescription medicines only as told by your health care provider. Aspirin or blood thinners (antiplatelets or  anticoagulants) may be recommended to reduce your risk of forming blood clots that can lead to stroke.  Make sure that any other medical conditions you have, such as atrial fibrillation or atherosclerosis, are managed. What are the warning signs of a stroke? The warning signs of a stroke can be easily remembered as BEFAST.  B is for balance. Signs include: ? Dizziness. ? Loss of balance or coordination. ? Sudden trouble walking.  E is for eyes. Signs include: ? A sudden change in vision. ? Trouble seeing.  F is for face. Signs include: ? Sudden weakness or numbness of the face. ? The face or eyelid drooping to one side.  A is for arms. Signs include: ? Sudden weakness or numbness of the arm, usually on one side of the body.  S is for speech. Signs include: ? Trouble speaking (aphasia). ? Trouble understanding.  T is for time. ? These symptoms may represent a serious problem that is an emergency. Do not wait to see if the symptoms will go away. Get medical help right away. Call your local emergency services (911 in the [place] Do not drive yourself to the hospital.  Other signs of stroke may include: ? A sudden, severe headache with no known cause. ? Nausea or vomiting. ? Seizure.  Where to find more information: For more information, visit:  American Stroke Association: www.strokeassociation.org  National Stroke Association: www.stroke.org  Summary  You can prevent a stroke by eating healthy, exercising, not smoking, limiting alcohol intake, and managing any medical conditions you may have.  Do not use any products that contain nicotine or tobacco, such as cigarettes and e-cigarettes. If you need help quitting, ask your health care provider. It may also be helpful to avoid exposure to secondhand smoke.  Remember BEFAST for warning signs of stroke. Get help right away if you or a loved one has any of these signs. This information is not intended to replace advice given  to you by your health care provider. Make sure you discuss any questions you have with your health care provider. Document Released: 08/23/2004 Document Revised: 08/21/2016 Document Reviewed: 08/21/2016 Elsevier Interactive Patient Education  2018 [employer]  Your ears and sinuses are connected by the eustachian tube. When your sinuses are inflamed, this can close off the tube and cause fluid to collect in your middle ear. This can then cause dizziness, popping, clicking, ringing, and echoing in your ears. This is often NOT an infection and does NOT require antibiotics, it is caused by inflammation so the treatments help the inflammation. This can take a long time to get better so please be patient.  Here are things you can do to help with this: - Try the Flonase or Nasonex. Remember to spray each nostril twice towards the outer part of your eye.  Do not sniff but instead pinch your nose and tilt your head back to help the medicine get into your sinuses.  The best time to do this is at bedtime.Stop if you get blurred vision or nose bleeds.  -While drinking fluids, pinch and hold nose close and swallow, to help open eustachian tubes to drain fluid behind ear  drums. -Please pick one of the over the counter allergy medications below and take it once daily for allergies.  It will also help with fluid behind ear drums. Claritin or loratadine cheapest but likely the weakest  Zyrtec or certizine at night because it can make you sleepy The strongest is allegra or fexafinadine  Cheapest at [employer], [employer], [employer] -can use decongestant over the counter, please do not use if you have high blood pressure or certain heart conditions.   if worsening HA, changes vision/speech, imbalance, weakness go to the ER   Silent reflux: Not all heartburn burns...Marland KitchenMarland KitchenMarland Kitchen  What is LPR? Laryngopharyngeal reflux (LPR) or silent reflux is a condition in which acid that is made in the stomach travels up the esophagus  (swallowing tube) and gets to the throat. Not everyone with reflux has a lot of heartburn or indigestion. In fact, many people with LPR never have heartburn. This is why LPR is called SILENT REFLUX, and the terms "Silent reflux" and "LPR" are often used interchangeably. Because LPR is silent, it is sometimes difficult to diagnose.  How can you tell if you have LPR?  Marland Kitchen Chronic hoarseness- Some people have hoarseness that comes and goes . throat clearing  . Cough . It can cause shortness of breath and cause asthma like symptoms. Marland Kitchen a feeling of a lump in the throat  . difficulty swallowing . a problem with too much nose and throat drainage.  . Some people will feel their esophagus spasm which feels like their heart beating hard and fast, this will usually be after a meal, at rest, or lying down at night.    How do I treat this? Treatment for LPR should be individualized, and your doctor will suggest the best treatment for you. Generally there are several treatments for LPR: . changing habits and diet to reduce reflux,  . medications to reduce stomach acid, and  . surgery to prevent reflux. Most people with LPR need to modify how and when they eat, as well as take some medication, to get well. Sometimes, nonprescription liquid antacids, such as Maalox, Gelucil and Mylanta are recommended. When used, these antacids should be taken four times each day - one tablespoon one hour after each meal and before bedtime. Dietary and lifestyle changes alone are not often enough to control LPR - medications that reduce stomach acid are also usually needed. These must be prescribed by our doctor.   TIPS FOR REDUCING REFLUX AND LPR Control your LIFE-STYLE and your DIET! Marland Kitchen If you use tobacco, QUIT.  Marland Kitchen Smoking makes you reflux. After every cigarette you have some LPR.  . Don't wear clothing that is too tight, especially around the waist (trousers, corsets, belts).  . Do not lie down just after eating...in  fact, do not eat within three hours of bedtime.  . You should be on a low-fat diet.  . Limit your intake of red meat.  . Limit your intake of butter.  Marland Kitchen Avoid fried foods.  . Avoid chocolate  . Avoid cheese.  Marland Kitchen Avoid eggs. Marland Kitchen Specifically avoid caffeine (especially coffee and tea), soda pop (especially cola) and mints.  . Avoid alcoholic beverages, particularly in the evening.

## 2018-09-24 LAB — COMPLETE METABOLIC PANEL WITH GFR
AG Ratio: 1.8 (calc) (ref 1.0–2.5)
ALT: 15 U/L (ref 6–29)
AST: 18 U/L (ref 10–35)
Albumin: 4.2 g/dL (ref 3.6–5.1)
Alkaline phosphatase (APISO): 42 U/L (ref 37–153)
BUN/Creatinine Ratio: 18 (calc) (ref 6–22)
BUN: 20 mg/dL (ref 7–25)
CO2: 29 mmol/L (ref 20–32)
Calcium: 9.2 mg/dL (ref 8.6–10.4)
Chloride: 103 mmol/L (ref 98–110)
Creat: 1.13 mg/dL — ABNORMAL HIGH (ref 0.60–0.88)
GFR, Est African American: 53 mL/min/{1.73_m2} — ABNORMAL LOW (ref >=60)
GFR, Est Non African American: 46 mL/min/{1.73_m2} — ABNORMAL LOW (ref >=60)
Globulin: 2.3 g/dL (calc) (ref 1.9–3.7)
Glucose, Bld: 102 mg/dL — ABNORMAL HIGH (ref 65–99)
Potassium: 4.2 mmol/L (ref 3.5–5.3)
Sodium: 141 mmol/L (ref 135–146)
Total Bilirubin: 0.6 mg/dL (ref 0.2–1.2)
Total Protein: 6.5 g/dL (ref 6.1–8.1)

## 2018-09-24 LAB — LIPID PANEL
Cholesterol: 154 mg/dL (ref <200)
HDL: 53 mg/dL (ref >=50)
LDL Cholesterol (Calc): 76 mg/dL (calc)
Non-HDL Cholesterol (Calc): 101 mg/dL (calc) (ref <130)
Total CHOL/HDL Ratio: 2.9 (calc) (ref <5.0)
Triglycerides: 148 mg/dL (ref <150)

## 2018-09-24 LAB — CBC WITH DIFFERENTIAL/PLATELET
Absolute Monocytes: 433 cells/uL (ref 200–950)
Basophils Absolute: 57 cells/uL (ref 0–200)
Basophils Relative: 0.8 %
Eosinophils Absolute: 99 cells/uL (ref 15–500)
Eosinophils Relative: 1.4 %
HCT: 41.3 % (ref 35.0–45.0)
Hemoglobin: 14.1 g/dL (ref 11.7–15.5)
Lymphs Abs: 2315 cells/uL (ref 850–3900)
MCH: 33.2 pg — ABNORMAL HIGH (ref 27.0–33.0)
MCHC: 34.1 g/dL (ref 32.0–36.0)
MCV: 97.2 fL (ref 80.0–100.0)
MPV: 11 fL (ref 7.5–12.5)
Monocytes Relative: 6.1 %
NEUTROS PCT: 59.1 %
Neutro Abs: 4196 cells/uL (ref 1500–7800)
Platelets: 188 10*3/uL (ref 140–400)
RBC: 4.25 10*6/uL (ref 3.80–5.10)
RDW: 12.9 % (ref 11.0–15.0)
TOTAL LYMPHOCYTE: 32.6 %
WBC: 7.1 10*3/uL (ref 3.8–10.8)

## 2018-09-24 LAB — HEMOGLOBIN A1C
Hgb A1c MFr Bld: 6.6 % of total Hgb — ABNORMAL HIGH (ref <5.7)
Mean Plasma Glucose: 143 (calc)
eAG (mmol/L): 7.9 (calc)

## 2018-09-24 LAB — TSH: TSH: 1.23 mIU/L (ref 0.40–4.50)

## 2018-09-30 ENCOUNTER — Telehealth: Payer: Self-pay | Admitting: Physician Assistant

## 2018-09-30 NOTE — Telephone Encounter (Signed)
patient calling to request referral to Cardiologist Dr Dorris Carnes, hx provider, for  follow up on A-Fib and opinion on recommendation to begin blood thinner medication. Patient contacted cardiology to schedule, cardiology requires referral. Per provider, referral entered.

## 2018-10-02 ENCOUNTER — Other Ambulatory Visit: Payer: Self-pay | Admitting: Internal Medicine

## 2018-10-20 ENCOUNTER — Other Ambulatory Visit: Payer: Self-pay | Admitting: Internal Medicine

## 2018-11-19 ENCOUNTER — Other Ambulatory Visit: Payer: Self-pay

## 2018-11-19 ENCOUNTER — Ambulatory Visit (INDEPENDENT_AMBULATORY_CARE_PROVIDER_SITE_OTHER): Payer: Medicare Other | Admitting: Internal Medicine

## 2018-11-19 VITALS — BP 144/70 | HR 60 | Temp 97.7°F | Resp 16 | Ht 64.5 in | Wt 161.8 lb

## 2018-11-19 DIAGNOSIS — I10 Essential (primary) hypertension: Secondary | ICD-10-CM

## 2018-11-19 DIAGNOSIS — I872 Venous insufficiency (chronic) (peripheral): Secondary | ICD-10-CM

## 2018-11-19 MED ORDER — FUROSEMIDE 40 MG PO TABS: ORAL_TABLET | ORAL | 90 refills | Status: DC

## 2018-11-19 NOTE — Progress Notes (Signed)
[facility]  Unk Pinto, M.D.  [name]. [name], P.A.-C Liane Comber, [place] [address] [place], [place] [address] Telephone [phone] Telefax [phone]  History of Present Illness:     This very nice 84 yo WWF with hx/o HTN, HLD, T2_DM / CKD3, Gout, GERD and Vitamin D Deficiency present with c/o insidious onset of dependent edema. She admits probable excess sodium intake. Denies any c/o HA's , postural dizziness, exertional CP, palpitations, SOB or respiratory congestion.   Medications  .  atenolol (TENORMIN) 50 MG tablet, Take 1 tablet (50 mg total) by mouth daily. Marland Kitchen  diltiazem (CARDIZEM CD) 240 MG 24 hr capsule, TAKE 1 CAPSULE BY MOUTH ONCE DAILY FOR BLOOD PRESSURE .  indapamide (LOZOL) 1.25 MG tablet, TAKE 1 TABLET BY MOUTH TWICE DAILY FOR BLOOD PRESSURE AND FLUID .  lisinopril (PRINIVIL,ZESTRIL) 20 MG tablet, TAKE ONE TABLET BY MOUTH ONCE DAILY FOR BLOOD PRESSURE. .  pravastatin (PRAVACHOL) 40 MG tablet, TAKE 1 TABLET BY MOUTH DAILY. Marland Kitchen  allopurinol (ZYLOPRIM) 300 MG tablet, TAKE 1 TABLET BY MOUTH ONCE DAILY TO PREVENT GOUT .  aspirin EC 81 MG tablet, Take 81 mg by mouth daily. .  cholecalciferol (VITAMIN D) 1000 UNITS tablet, Take 5,000 Units by mouth every other day.  .  citalopram (CELEXA) 20 MG tablet, TAKE 1 TABLET (20 MG TOTAL) BY MOUTH DAILY. .  fish oil-omega-3 fatty acids 1000 MG capsule, Take 1 g by mouth daily. Marland Kitchen  FREESTYLE LITE test strip,  .  Lancets (FREESTYLE) lancets,  .  triamcinolone cream (KENALOG) 0.1 %, as needed.   Problem list She has Hypertension; Hyperlipemia; Gout; T2_NIDDM w/CKD2 (GFR 67 ml/min) ; Vitamin D deficiency; Medication management; Esophageal reflux; CKD stage 2 due to type 2 diabetes mellitus ([place]); Overweight (BMI 25.0-29.9); and Osteopenia on their problem list.   Observations/Objective:  BP (!) 144/70   Pulse 60   Temp 97.7 F (36.5 C)    Resp 16   Ht 5' 4.5" (1.638 m)   Wt 161 lb 12.8 oz (73.4 kg)   BMI 27.34 kg/m   HEENT - WNL. Neck - supple.  Chest - Clear equal BS. Cor - Nl HS. RRR w/o sig MGR. PP 1(+).  1+ pretibial & ankle edema. MS- FROM w/o deformities.  Gait Nl. Neuro -  Nl w/o focal abnormalities.  Assessment and Plan:  1. Essential hypertension  2. Edema of both lower extremities due to peripheral venous insufficiency  - furosemide 40 MG ; Take 1/2 to  tablet daily for  Fluid Retention  Disp: 90 tablet; Refill: 90  - Discussed low sodium diet.  Follow Up Instructions:     I discussed the assessment and treatment plan with the patient. The patient was provided an opportunity to ask questions and all were answered. The patient agreed with the plan and demonstrated an understanding of the instructions.  Over 22 minutes of exam, counseling, chart review and critical decision making was performed   Kirtland Bouchard, MD

## 2018-11-23 ENCOUNTER — Encounter: Payer: Self-pay | Admitting: Internal Medicine

## 2018-11-23 NOTE — Patient Instructions (Signed)

## 2018-11-24 ENCOUNTER — Ambulatory Visit: Payer: Medicare Other | Admitting: Internal Medicine

## 2018-12-11 ENCOUNTER — Ambulatory Visit (INDEPENDENT_AMBULATORY_CARE_PROVIDER_SITE_OTHER): Payer: Medicare Other | Admitting: Internal Medicine

## 2018-12-11 ENCOUNTER — Other Ambulatory Visit: Payer: Self-pay

## 2018-12-11 VITALS — BP 142/76 | HR 56 | Temp 97.3°F | Resp 16 | Ht 64.5 in | Wt 163.6 lb

## 2018-12-11 DIAGNOSIS — N182 Chronic kidney disease, stage 2 (mild): Secondary | ICD-10-CM

## 2018-12-11 DIAGNOSIS — I10 Essential (primary) hypertension: Secondary | ICD-10-CM | POA: Diagnosis not present

## 2018-12-11 DIAGNOSIS — I872 Venous insufficiency (chronic) (peripheral): Secondary | ICD-10-CM

## 2018-12-11 DIAGNOSIS — E1122 Type 2 diabetes mellitus with diabetic chronic kidney disease: Secondary | ICD-10-CM

## 2018-12-11 NOTE — Progress Notes (Signed)
Subjective:    Patient ID: Ruth Delgado, female    DOB: 03/19/1935, 84 y.o.   MRN: [id number]  HPI   Patient is a very nice 84 yo WWF with hx/o HTN , T2_DM & venous insufficiency edema who returns for a 3 week f/u after adding Furosemide with her Lozol for worsening dependent edema. Patient also was encouraged to limit sodium in her diet. Patient fel hed dependent has significantly improved.  Wt Readings from Last 3 Encounters:  12/11/18 163 lb 9.6 oz (74.2 kg)  11/19/18 161 lb 12.8 oz (73.4 kg)  09/23/18 163 lb 6.4 oz (74.1 kg)   Medication Sig  . allopurinol  300 MG tablet Take 0.5 tablets (150 mg total) by mouth daily.  Marland Kitchen aspirin EC 81 MG tablet Take 81 mg by mouth daily.  Marland Kitchen atenolol  50 MG tablet Take 1 tablet (50 mg total) by mouth daily.  Marland Kitchen VITAMIN D 1000 UNITS  Take 5,000 Units by mouth every other day.   . Citalopram  20 MG tablet TAKE 1 TABLET (20 MG TOTAL) BY MOUTH DAILY.  Marland Kitchen CARDIZEM CD 240 MG  TAKE 1 CAPSULE BY MOUTH ONCE DAILY FOR BLOOD PRESSURE  . fish oil-omega-3  1000 MG cap Take 1 g by mouth daily.  . furosemide  40 MG tablet Take 1/2 to 1 tablet daily for fluid retention.  . indapamide  1.25 MG tablet TAKE 1 TABLET BY MOUTH TWICE DAILY FOR BLOOD PRESSURE AND FLUID  . lisinopril  20 MG tablet TAKE ONE TABLET BY MOUTH ONCE DAILY FOR BLOOD PRESSURE.  . Pravastatin 40 MG tablet TAKE 1 TABLET BY MOUTH DAILY.  Marland Kitchen triamcinolone crm  0.1 % as needed.    Allergies  Allergen Reactions  . Buspar [Buspirone]     dysphoria  . Lipitor [Atorvastatin]     Myalgias  . Metformin And Related     Gas/bloating  . Nsaids     GI upset  . Sulfa Antibiotics Other (See Comments)    Reaction=throat itching  . Sulfonamide Derivatives    Past Medical History:  Diagnosis Date  . A-fib ([place])   . Anxiety   . Arthritis   . Diabetes mellitus   . Gout   . Hyperlipemia   . Hypertension    Past Surgical History:  Procedure Laterality Date  . ABDOMINAL HYSTERECTOMY    . BIOPSY  BREAST     (L) BREAST IN 1993  . KNEE ARTHROSCOPY      Review of Systems    10 point systems review negative except as above.    Objective:   Physical Exam  BP (!) 142/76   Pulse (!) 56   Temp (!) 97.3 F (36.3 C)   Resp 16   Ht 5' 4.5" (1.638 m)   Wt 163 lb 9.6 oz (74.2 kg)   BMI 27.65 kg/m   HEENT - WNL. Neck - supple.  Chest - Clear equal BS. Cor - Nl HS. RRR w/o sig MGR. PP 1(+). Trace 1+ ankle edema. MS- FROM w/o deformities.  Gait Nl. Neuro -  Nl w/o focal abnormalities.    Assessment & Plan:   1. Essential hypertension  - BASIC METABOLIC PANEL WITH GFR  - continue Lasix & d/c Lozol  2. Type 2 diabetes mellitus with stage 2 chronic kidney disease, without long-term current use of insulin (HCC)  - BASIC METABOLIC PANEL WITH GFR  3. Edema of both lower extremities due to peripheral venous insufficiency  -  BASIC METABOLIC PANEL WITH GFR

## 2018-12-12 LAB — BASIC METABOLIC PANEL WITH GFR
BUN/Creatinine Ratio: 19 (calc) (ref 6–22)
BUN: 17 mg/dL (ref 7–25)
CO2: 33 mmol/L — ABNORMAL HIGH (ref 20–32)
Calcium: 9.8 mg/dL (ref 8.6–10.4)
Chloride: 99 mmol/L (ref 98–110)
Creat: 0.91 mg/dL — ABNORMAL HIGH (ref 0.60–0.88)
GFR, Est African American: 69 mL/min/{1.73_m2} (ref >=60)
GFR, Est Non African American: 59 mL/min/{1.73_m2} — ABNORMAL LOW (ref >=60)
Glucose, Bld: 99 mg/dL (ref 65–99)
Potassium: 3.8 mmol/L (ref 3.5–5.3)
Sodium: 141 mmol/L (ref 135–146)

## 2018-12-14 ENCOUNTER — Encounter: Payer: Self-pay | Admitting: Internal Medicine

## 2018-12-18 ENCOUNTER — Encounter: Payer: Self-pay | Admitting: Internal Medicine

## 2019-01-01 ENCOUNTER — Encounter: Payer: Self-pay | Admitting: Internal Medicine

## 2019-01-01 ENCOUNTER — Ambulatory Visit (INDEPENDENT_AMBULATORY_CARE_PROVIDER_SITE_OTHER): Payer: Medicare Other | Admitting: Internal Medicine

## 2019-01-01 ENCOUNTER — Other Ambulatory Visit: Payer: Self-pay

## 2019-01-01 VITALS — BP 128/64 | HR 64 | Temp 97.0°F | Resp 16 | Ht 64.5 in | Wt 165.0 lb

## 2019-01-01 DIAGNOSIS — Z1211 Encounter for screening for malignant neoplasm of colon: Secondary | ICD-10-CM

## 2019-01-01 DIAGNOSIS — Z8249 Family history of ischemic heart disease and other diseases of the circulatory system: Secondary | ICD-10-CM | POA: Diagnosis not present

## 2019-01-01 DIAGNOSIS — N183 Chronic kidney disease, stage 3 (moderate): Secondary | ICD-10-CM

## 2019-01-01 DIAGNOSIS — E782 Mixed hyperlipidemia: Secondary | ICD-10-CM

## 2019-01-01 DIAGNOSIS — M1000 Idiopathic gout, unspecified site: Secondary | ICD-10-CM | POA: Diagnosis not present

## 2019-01-01 DIAGNOSIS — Z79899 Other long term (current) drug therapy: Secondary | ICD-10-CM | POA: Diagnosis not present

## 2019-01-01 DIAGNOSIS — Z87891 Personal history of nicotine dependence: Secondary | ICD-10-CM

## 2019-01-01 DIAGNOSIS — E559 Vitamin D deficiency, unspecified: Secondary | ICD-10-CM | POA: Diagnosis not present

## 2019-01-01 DIAGNOSIS — E1122 Type 2 diabetes mellitus with diabetic chronic kidney disease: Secondary | ICD-10-CM

## 2019-01-01 DIAGNOSIS — Z136 Encounter for screening for cardiovascular disorders: Secondary | ICD-10-CM | POA: Diagnosis not present

## 2019-01-01 DIAGNOSIS — I10 Essential (primary) hypertension: Secondary | ICD-10-CM

## 2019-01-01 NOTE — Progress Notes (Signed)
[facility] Unk Pinto, M.D.     [name]. [name], P.A.-C [address], [place], [place] [address] Telephone [phone] Telefax [phone]  Comprehensive Evaluation &  Examination  History of Present Illness:     This very nice 84 y.o. WWF  has been followed for HTN, HLD, T2_NIDDM  and Vitamin D Deficiency. She presents for a  comprehensive evaluation and management of multiple medical co-morbidities. Patient has hx/o Gout controlled on her meds.       HTN predates circa 1991.  Patient's BP has been controlled at home and patient denies any cardiac symptoms as chest pain, palpitations, shortness of breath, dizziness or ankle swelling. Today's BP is at goal - 128/64.      Patient's hyperlipidemia is controlled with diet and medications. Patient denies myalgias or other medication SE's. Last lipids were  Lab Results  Component Value Date   CHOL 150 01/01/2019   HDL 57 01/01/2019   LDLCALC 74 01/01/2019   TRIG 108 01/01/2019   CHOLHDL 2.6 01/01/2019      Patient has hx/o T2_NIDDM predating since 2007 and patient denies reactive hypoglycemic symptoms, visual blurring, diabetic polys or paresthesias. Last A1c was not at goal: Lab Results  Component Value Date   HGBA1C 6.4 (H) 01/01/2019      Finally, patient has history of Vitamin D Deficiency and last Vitamin D was still very low: Lab Results  Component Value Date   VD25OH 36 01/01/2019    Current Outpatient Medications on File Prior to Visit  Medication Sig  . aspirin EC 81 MG tablet Take 81 mg by mouth daily.  Marland Kitchen atenolol (TENORMIN) 50 MG tablet Take 1 tablet (50 mg total) by mouth daily.  . cholecalciferol (VITAMIN D) 1000 UNITS tablet Take 5,000 Units by mouth every other day.   . citalopram (CELEXA) 20 MG tablet TAKE 1 TABLET (20 MG TOTAL) BY MOUTH DAILY.  Marland Kitchen diltiazem (CARDIZEM CD) 240 MG 24 hr capsule TAKE 1 CAPSULE  BY MOUTH ONCE DAILY FOR BLOOD PRESSURE  . fish oil-omega-3 fatty acids 1000 MG capsule Take 1 g by mouth daily.  Marland Kitchen FREESTYLE LITE test strip   . furosemide (LASIX) 40 MG tablet Take 1/2 to 1 tablet daily for fluid retention.  . Lancets (FREESTYLE) lancets   . lisinopril (PRINIVIL,ZESTRIL) 20 MG tablet TAKE ONE TABLET BY MOUTH ONCE DAILY FOR BLOOD PRESSURE.  . pravastatin (PRAVACHOL) 40 MG tablet TAKE 1 TABLET BY MOUTH DAILY.  Marland Kitchen triamcinolone cream (KENALOG) 0.1 % as needed.    No current facility-administered medications on file prior to visit.    Allergies  Allergen Reactions  . Buspar [Buspirone]     dysphoria  . Lipitor [Atorvastatin]     Myalgias  . Metformin And Related     Gas/bloating  . Nsaids     GI upset  . Sulfa Antibiotics Other (See Comments)    Reaction=throat itching  . Sulfonamide Derivatives    Past Medical History:  Diagnosis Date  . A-fib ([place])   . Anxiety   . Arthritis   . Diabetes mellitus   . Gout   . Hyperlipemia   . Hypertension    Health Maintenance  Topic Date Due  . OPHTHALMOLOGY EXAM  11/13/2018  . INFLUENZA VACCINE  02/28/2019  . MAMMOGRAM  03/11/2019  . HEMOGLOBIN A1C  07/03/2019  . FOOT EXAM  01/01/2020  . TETANUS/TDAP  11/13/2027  . DEXA SCAN  Completed  . PNA vac Low Risk Adult  Discontinued   Immunization History  Administered Date(s) Administered  . Influenza Split 07/13/2014  . Influenza, High Dose Seasonal PF 04/14/2015, 04/16/2017, 06/13/2018  . Pneumococcal-Unspecified 06/07/2003  . Td 06/02/2007, 11/12/2017  . Zoster 05/30/2012   Last Colon - (+) Positive Cologard  - 11/29/2017 followed by (-) Negative Colonoscopy - 03/10/2018 - Dr Paulita Fujita and No f/u recommended due to age  Last MGM - 03/10/2018  Past Surgical History:  Procedure Laterality Date  . ABDOMINAL HYSTERECTOMY    . BIOPSY BREAST     (L) BREAST IN 1993  . KNEE ARTHROSCOPY     Family History  Problem Relation Age of Onset  . Diabetes Mother   .  Hypertension Father   . CVA Father    Social History   Tobacco Use  . Smoking status: Former Smoker    Last attempt to quit: 07/30/2006    Years since quitting: 12.4  . Smokeless tobacco: Never Used  Substance Use Topics  . Alcohol use: Yes    Alcohol/week: 2.0 standard drinks    Types: 2 Standard drinks or equivalent per week  . Drug use: Not on file    ROS Constitutional: Denies fever, chills, weight loss/gain, headaches, insomnia,  night sweats, and change in appetite. Does c/o fatigue. Eyes: Denies redness, blurred vision, diplopia, discharge, itchy, watery eyes.  ENT: Denies discharge, congestion, post nasal drip, epistaxis, sore throat, earache, hearing loss, dental pain, Tinnitus, Vertigo, Sinus pain, snoring.  Cardio: Denies chest pain, palpitations, irregular heartbeat, syncope, dyspnea, diaphoresis, orthopnea, PND, claudication, edema Respiratory: denies cough, dyspnea, DOE, pleurisy, hoarseness, laryngitis, wheezing.  Gastrointestinal: Denies dysphagia, heartburn, reflux, water brash, pain, cramps, nausea, vomiting, bloating, diarrhea, constipation, hematemesis, melena, hematochezia, jaundice, hemorrhoids Genitourinary: Denies dysuria, frequency, urgency, nocturia, hesitancy, discharge, hematuria, flank pain Breast: Breast lumps, nipple discharge, bleeding.  Musculoskeletal: Denies arthralgia, myalgia, stiffness, Jt. Swelling, pain, limp, and strain/sprain. Denies falls. Skin: Denies puritis, rash, hives, warts, acne, eczema, changing in skin lesion Neuro: No weakness, tremor, incoordination, spasms, paresthesia, pain Psychiatric: Denies confusion, memory loss, sensory loss. Denies Depression. Endocrine: Denies change in weight, skin, hair change, nocturia, and paresthesia, diabetic polys, visual blurring, hyper / hypo glycemic episodes.  Heme/Lymph: No excessive bleeding, bruising, enlarged lymph nodes.  Physical Exam  BP 128/64   Pulse 64   Temp (!) 97 F (36.1 C)    Resp 16   Ht 5' 4.5" (1.638 m)   Wt 165 lb (74.8 kg)   BMI 27.88 kg/m   General Appearance: Well nourished, well groomed and in no apparent distress.  Eyes: PERRLA, EOMs, conjunctiva no swelling or erythema, normal fundi and vessels. Sinuses: No frontal/maxillary tenderness ENT/Mouth: EACs patent / TMs  nl. Nares clear without erythema, swelling, mucoid exudates. Oral hygiene is good. No erythema, swelling, or exudate. Tongue normal, non-obstructing. Tonsils not swollen or erythematous. Hearing normal.  Neck: Supple, thyroid not palpable. No bruits, nodes or JVD. Respiratory: Respiratory effort normal.  BS equal and clear bilateral without rales, rhonci, wheezing or stridor. Cardio: Heart sounds are normal with regular rate and rhythm and no murmurs, rubs or gallops. Peripheral pulses are normal and equal bilaterally without edema. No aortic or femoral bruits. Chest: symmetric with normal excursions and percussion. Breasts: Symmetric, without lumps, nipple discharge, retractions, or fibrocystic changes.  Abdomen: Flat, soft with bowel sounds active. Nontender, no guarding, rebound, hernias, masses, or organomegaly.  Lymphatics: Non tender without lymphadenopathy.  Musculoskeletal: Full ROM all peripheral extremities,  joint stability, 5/5 strength, and normal gait. Skin: Warm and dry without rashes, lesions, cyanosis, clubbing or  ecchymosis.  Neuro: Cranial nerves intact, reflexes equal bilaterally. Normal muscle tone, no cerebellar symptoms. Sensation intact.  Pysch: Alert and oriented X 3, normal affect, Insight and Judgment appropriate.   Assessment and Plan  1. Essential hypertension  - EKG 12-Lead - Urinalysis, Routine w reflex microscopic - Microalbumin / creatinine urine ratio - CBC with Differential/Platelet - COMPLETE METABOLIC PANEL WITH GFR - Magnesium - TSH  2. Hyperlipidemia, mixed  - EKG 12-Lead - Lipid panel - TSH  3. Type 2 diabetes mellitus with stage 3  chronic kidney disease, without long-term current use of insulin (HCC)  - EKG 12-Lead - Urinalysis, Routine w reflex microscopic - Microalbumin / creatinine urine ratio - HM DIABETES FOOT EXAM - LOW EXTREMITY NEUR EXAM DOCUM - PTH, intact and calcium - Hemoglobin A1c - Insulin, random  4. Vitamin D deficiency  - VITAMIN D 25 Hydroxyl  5. Idiopathic gout  - Uric acid  6. Screening for colorectal cancer  - POC Hemoccult Bld/Stl  7. Screening for ischemic heart disease  - EKG 12-Lead  8. FH: hypertension  - EKG 12-Lead  9. Former smoker  - EKG 12-Lead  10. Medication management  - Urinalysis, Routine w reflex microscopic - Microalbumin / creatinine urine ratio - CBC with Differential/Platelet - COMPLETE METABOLIC PANEL WITH GFR - Magnesium - Lipid panel - TSH - Hemoglobin A1c - Insulin, random - VITAMIN D 25 Hydroxyl - Uric acid       Patient was counseled in prudent diet to achieve/maintain BMI less than 25 for weight control, BP monitoring, regular exercise and medications. Discussed med's effects and SE's. Screening labs and tests as requested with regular follow-up as recommended. I discussed the assessment and treatment plan as above with the patient. The patient was provided an opportunity to ask questions and all were answered. The patient agreed with the plan and demonstrated an understanding of the instructions. Over 40 minutes of exam, counseling, chart review and high complex critical decision making was performed.   Kirtland Bouchard, MD

## 2019-01-01 NOTE — Patient Instructions (Signed)
- Vit D  And Vit C 1,000 mg  are recommended to help protect  against the Covid_19 and other Corona viruses.   - Also it's recommended to take Zinc 50 mg to help  protect against the Covid_19  And best place to get  is also on [employer] and don't pay more than 6-8 cents /pill !   ================================ Coronavirus (COVID-19) Are you at risk?  Are you at risk for the Coronavirus (COVID-19)?  To be considered HIGH RISK for Coronavirus (COVID-19), you have to meet the following criteria:  . Traveled to [place], [place], [place], [place] or [place]; or in the [place] to [place], [place], [place]  . or [place]; and have fever, cough, and shortness of breath within the last 2 weeks of travel OR . Been in close contact with a person diagnosed with COVID-19 within the last 2 weeks and have  . fever, cough,and shortness of breath .  . IF YOU DO NOT MEET THESE CRITERIA, YOU ARE CONSIDERED LOW RISK FOR COVID-19.  What to do if you are HIGH RISK for COVID-19?  Marland Kitchen If you are having a medical emergency, call 911. . Seek medical care right away. Before you go to a doctor's office, urgent care or emergency department, .  call ahead and tell them about your recent travel, contact with someone diagnosed with COVID-19  .  and your symptoms.  . You should receive instructions from your physician's office regarding next steps of care.  . When you arrive at healthcare provider, tell the healthcare staff immediately you have returned from  . visiting [place], [place], [place], [place] or [place]; or traveled in the [place] to [place], [place],  . [place] or [place] in the last two weeks or you have been in close contact with a person diagnosed with  . COVID-19 in the last 2 weeks.   . Tell the health care staff about your symptoms: fever, cough and shortness of breath. . After you have been seen by a medical provider, you will be either: o Tested for (COVID-19) and  discharged home on quarantine except to seek medical care if  o symptoms worsen, and asked to  - Stay home and avoid contact with others until you get your results (4-5 days)  - Avoid travel on public transportation if possible (such as bus, train, or airplane) or o Sent to the Emergency Department by EMS for evaluation, COVID-19 testing  and  o possible admission depending on your condition and test results.  What to do if you are LOW RISK for COVID-19?  Reduce your risk of any infection by using the same precautions used for avoiding the common cold or flu:  Marland Kitchen Wash your hands often with soap and warm water for at least 20 seconds.  If soap and water are not readily available,  . use an alcohol-based hand sanitizer with at least 60% alcohol.  . If coughing or sneezing, cover your mouth and nose by coughing or sneezing into the elbow areas of your shirt or coat, .  into a tissue or into your sleeve (not your hands). . Avoid shaking hands with others and consider head nods or verbal greetings only. . Avoid touching your eyes, nose, or mouth with unwashed hands.  . Avoid close contact with people who are sick. . Avoid places or events with large numbers of people in one location, like concerts or sporting events. . Carefully consider travel plans  you have or are making. . If you are planning any travel outside or inside the [place], visit the CDC's Travelers' Health webpage for the latest health notices. . If you have some symptoms but not all symptoms, continue to monitor at home and seek medical attention  . if your symptoms worsen. . If you are having a medical emergency, call 911.   . >>>>>>>>>>>>>>>>>>>>>>>>>>>>>>>>> . We Do NOT Approve of  [facility] [place] Soliciting Our Patients  To Do Home Visits & We Do NOT Approve of [employer] > > > > > > > > > > > > > > > > > > > > > > > > > > > > > > > > > > > > > > >  Preventive Care for Adults  A healthy lifestyle  and preventive care can promote health and wellness. Preventive health guidelines for women include the following key practices.  A routine yearly physical is a good way to check with your health care provider about your health and preventive screening. It is a chance to share any concerns and updates on your health and to receive a thorough exam.  Visit your dentist for a routine exam and preventive care every 6 months. Brush your teeth twice a day and floss once a day. Good oral hygiene prevents tooth decay and gum disease.  The frequency of eye exams is based on your age, health, family medical history, use of contact lenses, and other factors. Follow your health care provider's recommendations for frequency of eye exams.  Eat a healthy diet. Foods like vegetables, fruits, whole grains, low-fat dairy products, and lean protein foods contain the nutrients you need without too many calories. Decrease your intake of foods high in solid fats, added sugars, and salt. Eat the right amount of calories for you. Get information about a proper diet from your health care provider, if necessary.  Regular physical exercise is one of the most important things you can do for your health. Most adults should get at least 150 minutes of moderate-intensity exercise (any activity that increases your heart rate and causes you to sweat) each week. In addition, most adults need muscle-strengthening exercises on 2 or more days a week.  Maintain a healthy weight. The body mass index (BMI) is a screening tool to identify possible weight problems. It provides an estimate of body fat based on height and weight. Your health care provider can find your BMI and can help you achieve or maintain a healthy weight. For adults 20 years and older:  A BMI below 18.5 is considered underweight.  A BMI of 18.5 to 24.9 is normal.  A BMI of 25 to 29.9 is considered overweight.  A BMI of 30 and above is considered obese.  Maintain  normal blood lipids and cholesterol levels by exercising and minimizing your intake of saturated fat. Eat a balanced diet with plenty of fruit and vegetables. If your lipid or cholesterol levels are high, you are over 50, or you are at high risk for heart disease, you may need your cholesterol levels checked more frequently. Ongoing high lipid and cholesterol levels should be treated with medicines if diet and exercise are not working.  If you smoke, find out from your health care provider how to quit. If you do not use tobacco, do not start.  Lung cancer screening is recommended for adults aged 55-80 years who are at high risk for developing lung cancer because of a history   of smoking. A yearly low-dose CT scan of the lungs is recommended for people who have at least a 30-pack-year history of smoking and are a current smoker or have quit within the past 15 years. A pack year of smoking is smoking an average of 1 pack of cigarettes a day for 1 year (for example: 1 pack a day for 30 years or 2 packs a day for 15 years). Yearly screening should continue until the smoker has stopped smoking for at least 15 years. Yearly screening should be stopped for people who develop a health problem that would prevent them from having lung cancer treatment.  Avoid use of street drugs. Do not share needles with anyone. Ask for help if you need support or instructions about stopping the use of drugs.  High blood pressure causes heart disease and increases the risk of stroke.  Ongoing high blood pressure should be treated with medicines if weight loss and exercise do not work.  If you are 27-58 years old, ask your health care provider if you should take aspirin to prevent strokes.  Diabetes screening involves taking a blood sample to check your fasting blood sugar level. This should be done once every 3 years, after age 73, if you are within normal weight and without risk factors for diabetes. Testing should be considered  at a younger age or be carried out more frequently if you are overweight and have at least 1 risk factor for diabetes.  Breast cancer screening is essential preventive care for women. You should practice "breast self-awareness." This means understanding the normal appearance and feel of your breasts and may include breast self-examination. Any changes detected, no matter how small, should be reported to a health care provider. Women in their 43s and 30s should have a clinical breast exam (CBE) by a health care provider as part of a regular health exam every 1 to 3 years. After age 4, women should have a CBE every year. Starting at age 77, women should consider having a mammogram (breast X-ray test) every year. Women who have a family history of breast cancer should talk to their health care provider about genetic screening. Women at a high risk of breast cancer should talk to their health care providers about having an MRI and a mammogram every year.  Breast cancer gene (BRCA)-related cancer risk assessment is recommended for women who have family members with BRCA-related cancers. BRCA-related cancers include breast, ovarian, tubal, and peritoneal cancers. Having family members with these cancers may be associated with an increased risk for harmful changes (mutations) in the breast cancer genes BRCA1 and BRCA2. Results of the assessment will determine the need for genetic counseling and BRCA1 and BRCA2 testing.  Routine pelvic exams to screen for cancer are no longer recommended for nonpregnant women who are considered low risk for cancer of the pelvic organs (ovaries, uterus, and vagina) and who do not have symptoms. Ask your health care provider if a screening pelvic exam is right for you.  If you have had past treatment for cervical cancer or a condition that could lead to cancer, you need Pap tests and screening for cancer for at least 20 years after your treatment. If Pap tests have been discontinued,  your risk factors (such as having a new sexual partner) need to be reassessed to determine if screening should be resumed. Some women have medical problems that increase the chance of getting cervical cancer. In these cases, your health care provider may recommend more  frequent screening and Pap tests.    Colorectal cancer can be detected and often prevented. Most routine colorectal cancer screening begins at the age of 50 years and continues through age 75 years. However, your health care provider may recommend screening at an earlier age if you have risk factors for colon cancer. On a yearly basis, your health care provider may provide home test kits to check for hidden blood in the stool. Use of a small camera at the end of a tube, to directly examine the colon (sigmoidoscopy or colonoscopy), can detect the earliest forms of colorectal cancer. Talk to your health care provider about this at age 50, when routine screening begins.  Direct exam of the colon should be repeated every 5-10 years through age 75 years, unless early forms of pre-cancerous polyps or small growths are found.  Osteoporosis is a disease in which the bones lose minerals and strength with aging. This can result in serious bone fractures or breaks. The risk of osteoporosis can be identified using a bone density scan. Women ages 65 years and over and women at risk for fractures or osteoporosis should discuss screening with their health care providers. Ask your health care provider whether you should take a calcium supplement or vitamin D to reduce the rate of osteoporosis.  Menopause can be associated with physical symptoms and risks. Hormone replacement therapy is available to decrease symptoms and risks. You should talk to your health care provider about whether hormone replacement therapy is right for you.  Use sunscreen. Apply sunscreen liberally and repeatedly throughout the day. You should seek shade when your shadow is shorter  than you. Protect yourself by wearing long sleeves, pants, a wide-brimmed hat, and sunglasses year round, whenever you are outdoors.  Once a month, do a whole body skin exam, using a mirror to look at the skin on your back. Tell your health care provider of new moles, moles that have irregular borders, moles that are larger than a pencil eraser, or moles that have changed in shape or color.  Stay current with required vaccines (immunizations).  Influenza vaccine. All adults should be immunized every year.  Tetanus, diphtheria, and acellular pertussis (Td, Tdap) vaccine. Pregnant women should receive 1 dose of Tdap vaccine during each pregnancy. The dose should be obtained regardless of the length of time since the last dose. Immunization is preferred during the 27th-36th week of gestation. An adult who has not previously received Tdap or who does not know her vaccine status should receive 1 dose of Tdap. This initial dose should be followed by tetanus and diphtheria toxoids (Td) booster doses every 10 years. Adults with an unknown or incomplete history of completing a 3-dose immunization series with Td-containing vaccines should begin or complete a primary immunization series including a Tdap dose. Adults should receive a Td booster every 10 years.    Zoster vaccine. One dose is recommended for adults aged 60 years or older unless certain conditions are present.    Pneumococcal 13-valent conjugate (PCV13) vaccine. When indicated, a person who is uncertain of her immunization history and has no record of immunization should receive the PCV13 vaccine. An adult aged 19 years or older who has certain medical conditions and has not been previously immunized should receive 1 dose of PCV13 vaccine. This PCV13 should be followed with a dose of pneumococcal polysaccharide (PPSV23) vaccine. The PPSV23 vaccine dose should be obtained at least 1 or more year(s) after the dose of PCV13 vaccine. An adult   aged 24  years or older who has certain medical conditions and previously received 1 or more doses of PPSV23 vaccine should receive 1 dose of PCV13. The PCV13 vaccine dose should be obtained 1 or more years after the last PPSV23 vaccine dose.    Pneumococcal polysaccharide (PPSV23) vaccine. When PCV13 is also indicated, PCV13 should be obtained first. All adults aged 74 years and older should be immunized. An adult younger than age 85 years who has certain medical conditions should be immunized. Any person who resides in a nursing home or long-term care facility should be immunized. An adult smoker should be immunized. People with an immunocompromised condition and certain other conditions should receive both PCV13 and PPSV23 vaccines. People with human immunodeficiency virus (HIV) infection should be immunized as soon as possible after diagnosis. Immunization during chemotherapy or radiation therapy should be avoided. Routine use of PPSV23 vaccine is not recommended for American Indians, [place] Natives, or people younger than 65 years unless there are medical conditions that require PPSV23 vaccine. When indicated, people who have unknown immunization and have no record of immunization should receive PPSV23 vaccine. One-time revaccination 5 years after the first dose of PPSV23 is recommended for people aged 19-64 years who have chronic kidney failure, nephrotic syndrome, asplenia, or immunocompromised conditions. People who received 1-2 doses of PPSV23 before age 10 years should receive another dose of PPSV23 vaccine at age 31 years or later if at least 5 years have passed since the previous dose. Doses of PPSV23 are not needed for people immunized with PPSV23 at or after age 76 years.   Preventive Services / Frequency  Ages 30 years and over  Blood pressure check.  Lipid and cholesterol check.  Lung cancer screening. / Every year if you are aged 72-80 years and have a 30-pack-year history of smoking and  currently smoke or have quit within the past 15 years. Yearly screening is stopped once you have quit smoking for at least 15 years or develop a health problem that would prevent you from having lung cancer treatment.  Clinical breast exam.** / Every year after age 25 years.   BRCA-related cancer risk assessment.** / For women who have family members with a BRCA-related cancer (breast, ovarian, tubal, or peritoneal cancers).  Mammogram.** / Every year beginning at age 29 years and continuing for as long as you are in good health. Consult with your health care provider.  Pap test.** / Every 3 years starting at age 79 years through age 74 or 44 years with 3 consecutive normal Pap tests. Testing can be stopped between 65 and 70 years with 3 consecutive normal Pap tests and no abnormal Pap or HPV tests in the past 10 years.  Fecal occult blood test (FOBT) of stool. / Every year beginning at age 43 years and continuing until age 42 years. You may not need to do this test if you get a colonoscopy every 10 years.  Flexible sigmoidoscopy or colonoscopy.** / Every 5 years for a flexible sigmoidoscopy or every 10 years for a colonoscopy beginning at age 34 years and continuing until age 35 years.  Hepatitis C blood test.** / For all people born from [birth year] through 1963 and any individual with known risks for hepatitis C.  Osteoporosis screening.** / A one-time screening for women ages 58 years and over and women at risk for fractures or osteoporosis.  Skin self-exam. / Monthly.  Influenza vaccine. / Every year.  Tetanus, diphtheria, and acellular pertussis (Tdap/Td) vaccine.** /  1 dose of Td every 10 years.  Zoster vaccine.** / 1 dose for adults aged 60 years or older.  Pneumococcal 13-valent conjugate (PCV13) vaccine.** / Consult your health care provider.  Pneumococcal polysaccharide (PPSV23) vaccine.** / 1 dose for all adults aged 65 years and older. Screening for abdominal aortic aneurysm (AAA)   by ultrasound is recommended for people who have history of high blood pressure or who are current or former smokers. ++++++++++++++++++++ Recommend Adult Low Dose Aspirin or  coated  Aspirin 81 mg daily  To reduce risk of Colon Cancer 40 %,  Skin Cancer 26 % ,  Melanoma 46%  and  Pancreatic cancer 60% ++++++++++++++++++++ Vitamin D goal  is between 70-100.  Please make sure that you are taking your Vitamin D as directed.  It is very important as a natural anti-inflammatory  helping hair, skin, and nails, as well as reducing stroke and heart attack risk.  It helps your bones and helps with mood. It also decreases numerous cancer risks so please take it as directed.  Low Vit D is associated with a 200-300% higher risk for CANCER  and 200-300% higher risk for HEART   ATTACK  &  STROKE.   ...................................... It is also associated with higher death rate at younger ages,  autoimmune diseases like Rheumatoid arthritis, Lupus, Multiple Sclerosis.    Also many other serious conditions, like depression, Alzheimer's Dementia, infertility, muscle aches, fatigue, fibromyalgia - just to name a few. ++++++++++++++++++ Recommend the book "The END of DIETING" by Dr Joel Fuhrman  & the book "The END of DIABETES " by Dr Joel Fuhrman At [employer] - get book & Audio CD's    Being diabetic has a  300% increased risk for heart attack, stroke, cancer, and alzheimer- type vascular dementia. It is very important that you work harder with diet by avoiding all foods that are white. Avoid white rice (brown & wild rice is OK), white potatoes (sweetpotatoes in moderation is OK), White bread or wheat bread or anything made out of white flour like bagels, donuts, rolls, buns, biscuits, cakes, pastries, cookies, pizza crust, and pasta (made from white flour & egg whites) - vegetarian pasta or spinach or wheat pasta is OK. Multigrain breads like Arnold's or [employer], or multigrain sandwich thins  or flatbreads.  Diet, exercise and weight loss can reverse and cure diabetes in the early stages.  Diet, exercise and weight loss is very important in the control and prevention of complications of diabetes which affects every system in your body, ie. Brain - dementia/stroke, eyes - glaucoma/blindness, heart - heart attack/heart failure, kidneys - dialysis, stomach - gastric paralysis, intestines - malabsorption, nerves - severe painful neuritis, circulation - gangrene & loss of a leg(s), and finally cancer and Alzheimers.    I recommend avoid fried & greasy foods,  sweets/candy, white rice (brown or wild rice or Quinoa is OK), white potatoes (sweet potatoes are OK) - anything made from white flour - bagels, doughnuts, rolls, buns, biscuits,white and wheat breads, pizza crust and traditional pasta made of white flour & egg white(vegetarian pasta or spinach or wheat pasta is OK).  Multi-grain bread is OK - like multi-grain flat bread or sandwich thins. Avoid alcohol in excess. Exercise is also important.    Eat all the vegetables you want - avoid meat, especially red meat and dairy - especially cheese.  Cheese is the most concentrated form of trans-fats which is the worst thing to clog up our arteries. Veggie   cheese is OK which can be found in the fresh produce section at [employer] or [employer] or [employer]  +++++++++++++++++++ DASH Eating Plan  DASH stands for "Dietary Approaches to Stop Hypertension."   The DASH eating plan is a healthy eating plan that has been shown to reduce high blood pressure (hypertension). Additional health benefits may include reducing the risk of type 2 diabetes mellitus, heart disease, and stroke. The DASH eating plan may also help with weight loss. WHAT DO I NEED TO KNOW ABOUT THE DASH EATING PLAN? For the DASH eating plan, you will follow these general guidelines:  Choose foods with a percent daily value for sodium of less than 5% (as listed on the food  label).  Use salt-free seasonings or herbs instead of table salt or sea salt.  Check with your health care provider or pharmacist before using salt substitutes.  Eat lower-sodium products, often labeled as "lower sodium" or "no salt added."  Eat fresh foods.  Eat more vegetables, fruits, and low-fat dairy products.  Choose whole grains. Look for the word "whole" as the first word in the ingredient list.  Choose fish   Limit sweets, desserts, sugars, and sugary drinks.  Choose heart-healthy fats.  Eat veggie cheese   Eat more home-cooked food and less restaurant, buffet, and fast food.  Limit fried foods.  Forrer foods using methods other than frying.  Limit canned vegetables. If you do use them, rinse them well to decrease the sodium.  When eating at a restaurant, ask that your food be prepared with less salt, or no salt if possible.                      WHAT FOODS CAN I EAT? Read Dr Fara Olden Fuhrman's books on The End of Dieting & The End of Diabetes  Grains Whole grain or whole wheat bread. Brown rice. Whole grain or whole wheat pasta. Quinoa, bulgur, and whole grain cereals. Low-sodium cereals. Corn or whole wheat flour tortillas. Whole grain cornbread. Whole grain crackers. Low-sodium crackers.  Vegetables Fresh or frozen vegetables (raw, steamed, roasted, or grilled). Low-sodium or reduced-sodium tomato and vegetable juices. Low-sodium or reduced-sodium tomato sauce and paste. Low-sodium or reduced-sodium canned vegetables.   Fruits All fresh, canned (in natural juice), or frozen fruits.  Protein Products  All fish and seafood.  Dried beans, peas, or lentils. Unsalted nuts and seeds. Unsalted canned beans.  Dairy Low-fat dairy products, such as skim or 1% milk, 2% or reduced-fat cheeses, low-fat ricotta or cottage cheese, or plain low-fat yogurt. Low-sodium or reduced-sodium cheeses.  Fats and Oils Tub margarines without trans fats. Light or reduced-fat mayonnaise  and salad dressings (reduced sodium). Avocado. Safflower, olive, or canola oils. Natural peanut or almond butter.  Other Unsalted popcorn and pretzels. The items listed above may not be a complete list of recommended foods or beverages. Contact your dietitian for more options.  +++++++++++++++  WHAT FOODS ARE NOT RECOMMENDED? Grains/ White flour or wheat flour White bread. White pasta. White rice. Refined cornbread. Bagels and croissants. Crackers that contain trans fat.  Vegetables  Creamed or fried vegetables. Vegetables in a . Regular canned vegetables. Regular canned tomato sauce and paste. Regular tomato and vegetable juices.  Fruits Dried fruits. Canned fruit in light or heavy syrup. Fruit juice.  Meat and Other Protein Products Meat in general - RED meat & White meat.  Fatty cuts of meat. Ribs, chicken wings, all processed meats as bacon, sausage, bologna, salami,  fatback, hot dogs, bratwurst and packaged luncheon meats.  Dairy Whole or 2% milk, cream, half-and-half, and cream cheese. Whole-fat or sweetened yogurt. Full-fat cheeses or blue cheese. Non-dairy creamers and whipped toppings. Processed cheese, cheese spreads, or cheese curds.  Condiments Onion and garlic salt, seasoned salt, table salt, and sea salt. Canned and packaged gravies. Worcestershire sauce. Tartar sauce. Barbecue sauce. Teriyaki sauce. Soy sauce, including reduced sodium. Steak sauce. Fish sauce. Oyster sauce. Cocktail sauce. Horseradish. Ketchup and mustard. Meat flavorings and tenderizers. Bouillon cubes. Hot sauce. Tabasco sauce. Marinades. Taco seasonings. Relishes.  Fats and Oils Butter, stick margarine, lard, shortening and bacon fat. Coconut, palm kernel, or palm oils. Regular salad dressings.  Pickles and olives. Salted popcorn and pretzels.  The items listed above may not be a complete list of foods and beverages to avoid.    

## 2019-01-02 ENCOUNTER — Other Ambulatory Visit: Payer: Self-pay | Admitting: Internal Medicine

## 2019-01-02 DIAGNOSIS — M1000 Idiopathic gout, unspecified site: Secondary | ICD-10-CM

## 2019-01-02 LAB — CBC WITH DIFFERENTIAL/PLATELET
Absolute Monocytes: 407 cells/uL (ref 200–950)
Basophils Absolute: 47 cells/uL (ref 0–200)
Basophils Relative: 0.8 %
Eosinophils Absolute: 100 cells/uL (ref 15–500)
Eosinophils Relative: 1.7 %
HCT: 40.2 % (ref 35.0–45.0)
Hemoglobin: 13.5 g/dL (ref 11.7–15.5)
Lymphs Abs: 2201 cells/uL (ref 850–3900)
MCH: 33.3 pg — ABNORMAL HIGH (ref 27.0–33.0)
MCHC: 33.6 g/dL (ref 32.0–36.0)
MCV: 99.3 fL (ref 80.0–100.0)
MPV: 10.8 fL (ref 7.5–12.5)
Monocytes Relative: 6.9 %
Neutro Abs: 3145 cells/uL (ref 1500–7800)
Neutrophils Relative %: 53.3 %
Platelets: 171 10*3/uL (ref 140–400)
RBC: 4.05 10*6/uL (ref 3.80–5.10)
RDW: 12.9 % (ref 11.0–15.0)
Total Lymphocyte: 37.3 %
WBC: 5.9 10*3/uL (ref 3.8–10.8)

## 2019-01-02 LAB — URINALYSIS, ROUTINE W REFLEX MICROSCOPIC
Bilirubin Urine: NEGATIVE
Glucose, UA: NEGATIVE
Hgb urine dipstick: NEGATIVE
Ketones, ur: NEGATIVE
Leukocytes,Ua: NEGATIVE
Nitrite: NEGATIVE
Protein, ur: NEGATIVE
Specific Gravity, Urine: 1.017 (ref 1.001–1.03)
pH: 6.5 (ref 5.0–8.0)

## 2019-01-02 LAB — LIPID PANEL
Cholesterol: 150 mg/dL (ref <200)
HDL: 57 mg/dL (ref >=50)
LDL Cholesterol (Calc): 74 mg/dL (calc)
Non-HDL Cholesterol (Calc): 93 mg/dL (calc) (ref <130)
Total CHOL/HDL Ratio: 2.6 (calc) (ref <5.0)
Triglycerides: 108 mg/dL (ref <150)

## 2019-01-02 LAB — COMPLETE METABOLIC PANEL WITH GFR
AG Ratio: 1.8 (calc) (ref 1.0–2.5)
ALT: 15 U/L (ref 6–29)
AST: 18 U/L (ref 10–35)
Albumin: 4 g/dL (ref 3.6–5.1)
Alkaline phosphatase (APISO): 41 U/L (ref 37–153)
BUN: 24 mg/dL (ref 7–25)
CO2: 28 mmol/L (ref 20–32)
Calcium: 9.1 mg/dL (ref 8.6–10.4)
Chloride: 104 mmol/L (ref 98–110)
Creat: 0.83 mg/dL (ref 0.60–0.88)
GFR, Est African American: 77 mL/min/{1.73_m2} (ref >=60)
GFR, Est Non African American: 66 mL/min/{1.73_m2} (ref >=60)
Globulin: 2.2 g/dL (calc) (ref 1.9–3.7)
Glucose, Bld: 104 mg/dL — ABNORMAL HIGH (ref 65–99)
Potassium: 3.9 mmol/L (ref 3.5–5.3)
Sodium: 141 mmol/L (ref 135–146)
Total Bilirubin: 0.5 mg/dL (ref 0.2–1.2)
Total Protein: 6.2 g/dL (ref 6.1–8.1)

## 2019-01-02 LAB — MICROALBUMIN / CREATININE URINE RATIO
Creatinine, Urine: 73 mg/dL (ref 20–275)
Microalb Creat Ratio: 3 mcg/mg creat (ref <30)
Microalb, Ur: 0.2 mg/dL

## 2019-01-02 LAB — INSULIN, RANDOM: Insulin: 9.6 u[IU]/mL

## 2019-01-02 LAB — HEMOGLOBIN A1C
Hgb A1c MFr Bld: 6.4 % of total Hgb — ABNORMAL HIGH (ref <5.7)
Mean Plasma Glucose: 137 (calc)
eAG (mmol/L): 7.6 (calc)

## 2019-01-02 LAB — PTH, INTACT AND CALCIUM
Calcium: 9.1 mg/dL (ref 8.6–10.4)
PTH: 61 pg/mL (ref 14–64)

## 2019-01-02 LAB — VITAMIN D 25 HYDROXY (VIT D DEFICIENCY, FRACTURES): Vit D, 25-Hydroxy: 36 ng/mL (ref 30–100)

## 2019-01-02 LAB — MAGNESIUM: Magnesium: 1.9 mg/dL (ref 1.5–2.5)

## 2019-01-02 LAB — URIC ACID: Uric Acid, Serum: 4 mg/dL (ref 2.5–7.0)

## 2019-01-02 LAB — TSH: TSH: 1.55 mIU/L (ref 0.40–4.50)

## 2019-01-04 ENCOUNTER — Encounter: Payer: Self-pay | Admitting: Internal Medicine

## 2019-01-05 ENCOUNTER — Other Ambulatory Visit: Payer: Self-pay | Admitting: Adult Health

## 2019-01-28 DIAGNOSIS — Z1159 Encounter for screening for other viral diseases: Secondary | ICD-10-CM | POA: Diagnosis not present

## 2019-03-17 ENCOUNTER — Ambulatory Visit: Payer: Self-pay | Admitting: Physician Assistant

## 2019-03-17 ENCOUNTER — Ambulatory Visit: Payer: Self-pay | Admitting: Adult Health

## 2019-03-17 DIAGNOSIS — R29890 Loss of height: Secondary | ICD-10-CM | POA: Diagnosis not present

## 2019-03-17 DIAGNOSIS — Z1231 Encounter for screening mammogram for malignant neoplasm of breast: Secondary | ICD-10-CM | POA: Diagnosis not present

## 2019-03-17 DIAGNOSIS — Z90710 Acquired absence of both cervix and uterus: Secondary | ICD-10-CM | POA: Diagnosis not present

## 2019-03-17 DIAGNOSIS — M8589 Other specified disorders of bone density and structure, multiple sites: Secondary | ICD-10-CM | POA: Diagnosis not present

## 2019-03-17 LAB — HM DEXA SCAN

## 2019-03-17 LAB — HM MAMMOGRAPHY

## 2019-03-18 ENCOUNTER — Encounter: Payer: Self-pay | Admitting: *Deleted

## 2019-04-02 NOTE — Progress Notes (Signed)
MEDICARE ANNUAL WELLNESS VISIT AND FOLLOW UP  Assessment:   Encounter for Medicare annual wellness exam  Essential hypertension - continue medications, DASH diet, exercise and monitor at home. Call if greater than 130/80.  -     CBC with Differential/Platelet -     CMP/GFR -     TSH  Paroxysmal atrial fibrillation (HCC) Rate controlled and in NSR with cardizem, not on anticoagulation with CHADSVASC of 5, discussed with patient, has not had Afib over 2-3 years, understands risk of stroke and declines at this time, continue ASA, will call office or go to ER if any symptoms, continue to monitor EKG in the office.   Gastroesophageal reflux disease, esophagitis presence not specified Continue PPI/H2 blocker, diet discussed  Type 2 diabetes mellitus with stage 2 chronic kidney disease, without long-term current use of insulin ([place]) Discussed general issues about diabetes pathophysiology and management., Educational material distributed., Suggested low cholesterol diet., Encouraged aerobic exercise., Discussed foot care., Reminded to get yearly retinal exam - report verified/abstracted Just had foot exam at CPE Patient requests we defer A1C to Dr. Chalmers Cater  Stage 2 CKD due to T2DM ([place]) Increase fluids, avoid NSAIDS, monitor sugars, will monitor      -      CMP/GFR  Mixed hyperlipidemia -continue medications, check lipids, decrease fatty foods, increase activity.  -     Lipid panel  Idiopathic gout, unspecified chronicity, unspecified site Gout- recheck Uric acid as needed, Diet discussed, continue medications.  Vitamin D deficiency Continue supplementation Check vitamin D level  Osteopenia Continue Vit D and Ca, weight bearing exercises Repeat dexa 2 years - 2022  Medication management -     Magnesium    Over 40 minutes of exam, counseling, chart review and critical decision making was performed Future Appointments  Date Time Provider [place]  07/09/2019  9:45 AM  Unk Pinto, MD [facility] None  02/05/2020 10:00 AM Unk Pinto, MD [facility] None    Plan:   During the course of the visit the patient was educated and counseled about appropriate screening and preventive services including:    Pneumococcal vaccine   Prevnar 13  Influenza vaccine  Td vaccine  Screening electrocardiogram  Bone densitometry screening  Colorectal cancer screening  Diabetes screening  Glaucoma screening  Nutrition counseling   Advanced directives: requested   Subjective:  Ruth Delgado is a 84 y.o. female who presents for Medicare Annual Wellness Visit and 3 month follow up for HTN, chol, T2DM with CKD.   BMI is Body mass index is 27.75 kg/m., she has been working on diet and exercise. Wt Readings from Last 3 Encounters:  04/08/19 164 lb 3.2 oz (74.5 kg)  01/01/19 165 lb (74.8 kg)  12/11/18 163 lb 9.6 oz (74.2 kg)   Her blood pressure has been controlled at home, today their BP is BP: 136/76  She does workout. She denies chest pain, shortness of breath, dizziness. She has a history of Afib, rate controlled, low risk for stroke, patient declines anticoagulation.    She is on cholesterol medication (pravastatin 40 mg daily) and denies myalgias. Her cholesterol is at goal. The cholesterol last visit was:   Lab Results  Component Value Date   CHOL 150 01/01/2019   HDL 57 01/01/2019   LDLCALC 74 01/01/2019   TRIG 108 01/01/2019   CHOLHDL 2.6 01/01/2019   She has been working on diet and exercise for Diabetes with diabetic chronic kidney disease, she is on bASA, she is on  ACE/ARB, follows with Dr. Chalmers Cater follows once a year and denies increased appetite, nausea, paresthesia of the feet and polydipsia. She checks glucose religiously and fasting values run 110s-120s. Last A1C was:  Lab Results  Component Value Date   HGBA1C 6.4 (H) 01/01/2019   Last GFR: Lab Results  Component Value Date   GFRNONAA 66 01/01/2019   Patient is on  Vitamin D supplement.   Lab Results  Component Value Date   VD25OH 36 01/01/2019     Patient is on allopurinol for gout and does not report a recent flare.  Lab Results  Component Value Date   LABURIC 4.0 01/01/2019    Medication Review: Current Outpatient Medications on File Prior to Visit  Medication Sig Dispense Refill  . allopurinol (ZYLOPRIM) 300 MG tablet Take 1 tablet Daily to prevent Gout 90 tablet 3  . aspirin EC 81 MG tablet Take 81 mg by mouth daily.    Marland Kitchen atenolol (TENORMIN) 50 MG tablet TAKE 1 TABLET BY MOUTH DAILY. 90 tablet 1  . cholecalciferol (VITAMIN D) 1000 UNITS tablet Take 5,000 Units by mouth every other day.     . citalopram (CELEXA) 20 MG tablet TAKE 1 TABLET (20 MG TOTAL) BY MOUTH DAILY. 90 tablet 3  . diltiazem (CARDIZEM CD) 240 MG 24 hr capsule TAKE 1 CAPSULE BY MOUTH ONCE DAILY FOR BLOOD PRESSURE 90 capsule 1  . fish oil-omega-3 fatty acids 1000 MG capsule Take 1 g by mouth daily.    Marland Kitchen FREESTYLE LITE test strip   12  . furosemide (LASIX) 40 MG tablet Take 1/2 to 1 tablet daily for fluid retention.    . Lancets (FREESTYLE) lancets   12  . lisinopril (PRINIVIL,ZESTRIL) 20 MG tablet TAKE ONE TABLET BY MOUTH ONCE DAILY FOR BLOOD PRESSURE. 90 tablet 3  . pravastatin (PRAVACHOL) 40 MG tablet TAKE 1 TABLET BY MOUTH DAILY. 90 tablet 1  . triamcinolone cream (KENALOG) 0.1 % as needed.   2   No current facility-administered medications on file prior to visit.     Allergies  Allergen Reactions  . Buspar [Buspirone]     dysphoria  . Lipitor [Atorvastatin]     Myalgias  . Metformin And Related     Gas/bloating  . Nsaids     GI upset  . Sulfa Antibiotics Other (See Comments)    Reaction=throat itching  . Sulfonamide Derivatives     Current Problems (verified) Patient Active Problem List   Diagnosis Date Noted  . CKD stage 2 due to type 2 diabetes mellitus ([place]) 03/06/2018  . Overweight (BMI 25.0-29.9) 03/06/2018  . Osteopenia 03/06/2018  . Esophageal  reflux 09/06/2015  . Vitamin D deficiency 06/08/2013  . Medication management 06/08/2013  . Hypertension   . Hyperlipemia   . Gout   . Type 2 diabetes mellitus with stage 3 chronic kidney disease, without long-term current use of insulin (HCC)     Screening Tests Immunization History  Administered Date(s) Administered  . Influenza Split 07/13/2014  . Influenza, High Dose Seasonal PF 04/14/2015, 04/16/2017, 06/13/2018  . Pneumococcal-Unspecified 06/07/2003  . Td 06/02/2007, 11/12/2017  . Zoster 05/30/2012   Cologuard + 11/2017 results in colonoscopy Last colonoscopy: 12/2017 - does not want another Last mammogram: 02/2019 DEXA:02/2019 osteopenia very close to osteoporosis  Prior vaccinations: TD or Tdap: 2013  Influenza: 2020 TODAY  Pneumococcal: 2008 Prevnar13: declines Shingles/Zostavax: 2013  Names of Other Physician/Practitioners you currently use: 1. [facility] here for primary care 2.  Dr. Einar Gip, eye doctor, last visit 11/12/2017 - report received and abstracted 3. Dr. Donn Pierini, dentist, last visit 2019  Patient Care Team: Unk Pinto, MD as PCP - General (Internal Medicine) Jacelyn Pi, MD as Consulting Physician (Endocrinology)  SURGICAL HISTORY She  has a past surgical history that includes Knee arthroscopy; Abdominal hysterectomy; and Biopsy breast. FAMILY HISTORY Her family history includes CVA in her father; Diabetes in her mother; Hypertension in her father. SOCIAL HISTORY She  reports that she quit smoking about 12 years ago. She has never used smokeless tobacco. She reports current alcohol use of about 2.0 standard drinks of alcohol per week.   MEDICARE WELLNESS OBJECTIVES: Physical activity: Current Exercise Habits: Home exercise routine, Type of exercise: walking(her dog, house work, yard work) Cardiac risk factors: Cardiac Risk Factors include: advanced age (>78men, >9  women);hypertension;dyslipidemia;sedentary lifestyle Depression/mood screen:   Depression screen [facility] 2/9 04/08/2019  Decreased Interest 0  Down, Depressed, Hopeless 0  PHQ - 2 Score 0    ADLs:  In your present state of health, do you have any difficulty performing the following activities: 04/08/2019 01/04/2019  Hearing? N N  Vision? N N  Difficulty concentrating or making decisions? N N  Walking or climbing stairs? N N  Dressing or bathing? N N  Doing errands, shopping? N N  Some recent data might be hidden    Cognitive Testing  Alert? Yes  Normal Appearance?Yes  Oriented to person? Yes  Place? Yes   Time? Yes  Recall of three objects?  Yes  Can perform simple calculations? Yes  Displays appropriate judgment?Yes  Can read the correct time from a watch face?Yes  EOL planning: Does Patient Have a Medical Advance Directive?: No, Yes Type of Advance Directive: Healthcare Power of Attorney, Living will Does patient want to make changes to medical advance directive?: No - Patient declined Copy of [place] in Chart?: No - copy requested  Review of Systems  Constitutional: Negative.   HENT: Negative.   Eyes: Negative.   Respiratory: Negative.   Cardiovascular: Negative.   Gastrointestinal: Negative.   Genitourinary: Negative.   Musculoskeletal: Negative.   Skin: Negative.   Neurological: Negative.   Endo/Heme/Allergies: Negative.   Psychiatric/Behavioral: Negative.      Objective:     Today's Vitals   04/08/19 1141  BP: 136/76  Pulse: 62  Temp: (!) 97.3 F (36.3 C)  SpO2: 97%  Weight: 164 lb 3.2 oz (74.5 kg)  Height: 5' 4.5" (1.638 m)   Body mass index is 27.75 kg/m.  General appearance: alert, no distress, WD/WN, female HEENT: normocephalic, sclerae anicteric, TMs pearly, nares patent, no discharge or erythema, pharynx normal Oral cavity: MMM, no lesions Neck: supple, no lymphadenopathy, no thyromegaly, no masses Heart: RRR, normal S1, S2, no  murmurs Lungs: CTA bilaterally, no wheezes, rhonchi, or rales Abdomen: +bs, soft, non tender, non distended, no masses, no hepatomegaly, no splenomegaly Musculoskeletal: nontender, no swelling, no obvious deformity Extremities: no edema, no cyanosis, no clubbing Pulses: 2+ symmetric, upper and lower extremities, normal cap refill Neurological: alert, oriented x 3, CN2-12 intact, strength normal upper extremities and lower extremities, sensation normal throughout, DTRs 2+ throughout, no cerebellar signs, gait normal Psychiatric: normal affect, behavior normal, pleasant   Medicare Attestation I have personally reviewed: The patient's medical and social history Their use of alcohol, tobacco or illicit drugs Their current medications and supplements The patient's functional ability including ADLs,fall risks, home safety risks, cognitive, and hearing and visual impairment Diet and  physical activities Evidence for depression or mood disorders  The patient's weight, height, BMI, and visual acuity have been recorded in the chart.  I have made referrals, counseling, and provided education to the patient based on review of the above and I have provided the patient with a written personalized care plan for preventive services.      Vicie Mutters, PA-C   04/08/2019

## 2019-04-08 ENCOUNTER — Other Ambulatory Visit: Payer: Self-pay

## 2019-04-08 ENCOUNTER — Encounter: Payer: Self-pay | Admitting: Physician Assistant

## 2019-04-08 ENCOUNTER — Ambulatory Visit (INDEPENDENT_AMBULATORY_CARE_PROVIDER_SITE_OTHER): Payer: Medicare Other | Admitting: Physician Assistant

## 2019-04-08 VITALS — BP 136/76 | HR 62 | Temp 97.3°F | Ht 64.5 in | Wt 164.2 lb

## 2019-04-08 DIAGNOSIS — E663 Overweight: Secondary | ICD-10-CM

## 2019-04-08 DIAGNOSIS — Z79899 Other long term (current) drug therapy: Secondary | ICD-10-CM | POA: Diagnosis not present

## 2019-04-08 DIAGNOSIS — K219 Gastro-esophageal reflux disease without esophagitis: Secondary | ICD-10-CM | POA: Diagnosis not present

## 2019-04-08 DIAGNOSIS — N182 Chronic kidney disease, stage 2 (mild): Secondary | ICD-10-CM | POA: Diagnosis not present

## 2019-04-08 DIAGNOSIS — E559 Vitamin D deficiency, unspecified: Secondary | ICD-10-CM | POA: Diagnosis not present

## 2019-04-08 DIAGNOSIS — E1122 Type 2 diabetes mellitus with diabetic chronic kidney disease: Secondary | ICD-10-CM | POA: Diagnosis not present

## 2019-04-08 DIAGNOSIS — I10 Essential (primary) hypertension: Secondary | ICD-10-CM | POA: Diagnosis not present

## 2019-04-08 DIAGNOSIS — Z0001 Encounter for general adult medical examination with abnormal findings: Secondary | ICD-10-CM | POA: Diagnosis not present

## 2019-04-08 DIAGNOSIS — R6889 Other general symptoms and signs: Secondary | ICD-10-CM

## 2019-04-08 DIAGNOSIS — Z0000 Encounter for general adult medical examination without abnormal findings: Secondary | ICD-10-CM

## 2019-04-08 DIAGNOSIS — M8589 Other specified disorders of bone density and structure, multiple sites: Secondary | ICD-10-CM

## 2019-04-08 DIAGNOSIS — E782 Mixed hyperlipidemia: Secondary | ICD-10-CM | POA: Diagnosis not present

## 2019-04-08 DIAGNOSIS — Z23 Encounter for immunization: Secondary | ICD-10-CM | POA: Diagnosis not present

## 2019-04-08 DIAGNOSIS — N183 Chronic kidney disease, stage 3 (moderate): Secondary | ICD-10-CM

## 2019-04-08 DIAGNOSIS — M1000 Idiopathic gout, unspecified site: Secondary | ICD-10-CM

## 2019-04-08 NOTE — Patient Instructions (Signed)
VARICOSE VEINS Varicose veins are veins that have become enlarged and twisted. CAUSES This condition is the result of valves in the veins not working properly. Valves in the veins help return blood from the leg to the heart. When your calf muscles squeeze, the blood moves up your leg then the valves close and this continues until the blood gets back to your heart.  If these valves are damaged, blood flows backwards and backs up into the veins in the leg near the skin OR if your are sitting/standing for a long time without using your calf muscles the blood will back up into the veins in your legs. This causes the veins to become larger. People who are on their feet a lot, sit a lot without walking (like on a plane, at a desk, or in a car), who are pregnant, or who are overweight are more likely to develop varicose veins. SYMPTOMS   Bulging, twisted-appearing, bluish veins, most commonly found on the legs.  Leg pain or a feeling of heaviness. These symptoms may be worse at the end of the day.  Leg swelling.  Skin color changes. DIAGNOSIS  Varicose veins can usually be diagnosed with an exam of your legs by your caregiver. He or she may recommend an ultrasound of your leg veins. TREATMENT  Most varicose veins can be treated at home. However, other treatments are available for people who have persistent symptoms or who want to treat the cosmetic appearance of the varicose veins. But this is only cosmetic and they will return if not properly treated. These include:  Laser treatment of very small varicose veins.  Medicine that is shot (injected) into the vein. This medicine hardens the walls of the vein and closes off the vein. This treatment is called sclerotherapy. Afterwards, you may need to wear clothing or bandages that apply pressure.  Surgery. HOME CARE INSTRUCTIONS   Do not stand or sit in one position for long periods of time. Do not sit with your legs crossed. Rest with your legs raised  during the day.  Your legs have to be higher than your heart so that gravity will force the valves to open, so please really elevate your legs.   Wear elastic stockings or support hose. Do not wear other tight, encircling garments around the legs, pelvis, or waist.  [employer]  has a wide variety of well priced compression stockings. [address], [place] [address] [phone]  OR THERE ARE COPPER INFUSED COMPRESSION SOCKS AT [facility] OR [facility]  [employer] also has great cheap/afforable stockings or socks- the socks are easier to get on your feet  - can also get a jacob's donner that helps you put on the sock  Walk as much as possible to increase blood flow.  Raise the foot of your bed at night with 2-inch blocks.  If you get a cut in the skin over the vein and the vein bleeds, lie down with your leg raised and press on it with a clean cloth until the bleeding stops. Then place a bandage (dressing) on the cut. See your caregiver if it continues to bleed or needs stitches. SEEK MEDICAL CARE IF:   The skin around your ankle starts to break down.  You have pain, redness, tenderness, or hard swelling developing in your leg over a vein.  You are uncomfortable due to leg pain. Document Released: 04/25/2005 Document Revised: 10/08/2011 Document Reviewed: 09/11/2010 [employer] Patient Information 2014 [employer].   

## 2019-04-09 LAB — COMPLETE METABOLIC PANEL WITH GFR
AG Ratio: 1.7 (calc) (ref 1.0–2.5)
ALT: 14 U/L (ref 6–29)
AST: 17 U/L (ref 10–35)
Albumin: 4 g/dL (ref 3.6–5.1)
Alkaline phosphatase (APISO): 48 U/L (ref 37–153)
BUN: 19 mg/dL (ref 7–25)
CO2: 27 mmol/L (ref 20–32)
Calcium: 9.3 mg/dL (ref 8.6–10.4)
Chloride: 106 mmol/L (ref 98–110)
Creat: 0.79 mg/dL (ref 0.60–0.88)
GFR, Est African American: 81 mL/min/{1.73_m2} (ref >=60)
GFR, Est Non African American: 70 mL/min/{1.73_m2} (ref >=60)
Globulin: 2.3 g/dL (calc) (ref 1.9–3.7)
Glucose, Bld: 109 mg/dL — ABNORMAL HIGH (ref 65–99)
Potassium: 4.1 mmol/L (ref 3.5–5.3)
Sodium: 141 mmol/L (ref 135–146)
Total Bilirubin: 0.4 mg/dL (ref 0.2–1.2)
Total Protein: 6.3 g/dL (ref 6.1–8.1)

## 2019-04-09 LAB — LIPID PANEL
Cholesterol: 168 mg/dL (ref <200)
HDL: 49 mg/dL — ABNORMAL LOW (ref >=50)
LDL Cholesterol (Calc): 88 mg/dL (calc)
Non-HDL Cholesterol (Calc): 119 mg/dL (calc) (ref <130)
Total CHOL/HDL Ratio: 3.4 (calc) (ref <5.0)
Triglycerides: 216 mg/dL — ABNORMAL HIGH (ref <150)

## 2019-04-09 LAB — CBC WITH DIFFERENTIAL/PLATELET
Absolute Monocytes: 450 cells/uL (ref 200–950)
Basophils Absolute: 60 cells/uL (ref 0–200)
Basophils Relative: 1 %
Eosinophils Absolute: 108 cells/uL (ref 15–500)
Eosinophils Relative: 1.8 %
HCT: 38.7 % (ref 35.0–45.0)
Hemoglobin: 13.1 g/dL (ref 11.7–15.5)
Lymphs Abs: 2190 cells/uL (ref 850–3900)
MCH: 33.2 pg — ABNORMAL HIGH (ref 27.0–33.0)
MCHC: 33.9 g/dL (ref 32.0–36.0)
MCV: 98 fL (ref 80.0–100.0)
MPV: 11.1 fL (ref 7.5–12.5)
Monocytes Relative: 7.5 %
Neutro Abs: 3192 cells/uL (ref 1500–7800)
Neutrophils Relative %: 53.2 %
Platelets: 201 10*3/uL (ref 140–400)
RBC: 3.95 10*6/uL (ref 3.80–5.10)
RDW: 12.8 % (ref 11.0–15.0)
Total Lymphocyte: 36.5 %
WBC: 6 10*3/uL (ref 3.8–10.8)

## 2019-04-09 LAB — MAGNESIUM: Magnesium: 2.2 mg/dL (ref 1.5–2.5)

## 2019-04-09 LAB — TSH: TSH: 1.43 mIU/L (ref 0.40–4.50)

## 2019-04-15 ENCOUNTER — Other Ambulatory Visit: Payer: Self-pay | Admitting: Internal Medicine

## 2019-04-23 DIAGNOSIS — E1165 Type 2 diabetes mellitus with hyperglycemia: Secondary | ICD-10-CM | POA: Diagnosis not present

## 2019-04-23 DIAGNOSIS — I868 Varicose veins of other specified sites: Secondary | ICD-10-CM | POA: Diagnosis not present

## 2019-04-23 DIAGNOSIS — I10 Essential (primary) hypertension: Secondary | ICD-10-CM | POA: Diagnosis not present

## 2019-04-23 DIAGNOSIS — E7800 Pure hypercholesterolemia, unspecified: Secondary | ICD-10-CM | POA: Diagnosis not present

## 2019-05-05 DIAGNOSIS — I8311 Varicose veins of right lower extremity with inflammation: Secondary | ICD-10-CM | POA: Diagnosis not present

## 2019-05-05 DIAGNOSIS — I8312 Varicose veins of left lower extremity with inflammation: Secondary | ICD-10-CM | POA: Diagnosis not present

## 2019-05-05 DIAGNOSIS — I83813 Varicose veins of bilateral lower extremities with pain: Secondary | ICD-10-CM | POA: Diagnosis not present

## 2019-05-11 DIAGNOSIS — I8312 Varicose veins of left lower extremity with inflammation: Secondary | ICD-10-CM | POA: Diagnosis not present

## 2019-05-11 DIAGNOSIS — I8311 Varicose veins of right lower extremity with inflammation: Secondary | ICD-10-CM | POA: Diagnosis not present

## 2019-05-11 DIAGNOSIS — I83813 Varicose veins of bilateral lower extremities with pain: Secondary | ICD-10-CM | POA: Diagnosis not present

## 2019-05-14 DIAGNOSIS — I8311 Varicose veins of right lower extremity with inflammation: Secondary | ICD-10-CM | POA: Diagnosis not present

## 2019-05-14 DIAGNOSIS — I8312 Varicose veins of left lower extremity with inflammation: Secondary | ICD-10-CM | POA: Diagnosis not present

## 2019-05-14 DIAGNOSIS — I83813 Varicose veins of bilateral lower extremities with pain: Secondary | ICD-10-CM | POA: Diagnosis not present

## 2019-06-03 ENCOUNTER — Ambulatory Visit (INDEPENDENT_AMBULATORY_CARE_PROVIDER_SITE_OTHER): Payer: Medicare Other | Admitting: Physician Assistant

## 2019-06-03 ENCOUNTER — Other Ambulatory Visit: Payer: Self-pay

## 2019-06-03 VITALS — BP 110/58 | HR 56 | Temp 97.5°F | Ht 64.5 in | Wt 161.2 lb

## 2019-06-03 DIAGNOSIS — W07XXXA Fall from chair, initial encounter: Secondary | ICD-10-CM

## 2019-06-03 DIAGNOSIS — R059 Cough, unspecified: Secondary | ICD-10-CM

## 2019-06-03 DIAGNOSIS — R0781 Pleurodynia: Secondary | ICD-10-CM

## 2019-06-03 DIAGNOSIS — R05 Cough: Secondary | ICD-10-CM | POA: Diagnosis not present

## 2019-06-03 MED ORDER — TRAMADOL HCL 50 MG PO TABS: ORAL_TABLET | ORAL | 0 refills | Status: DC

## 2019-06-03 NOTE — Progress Notes (Signed)
Assessment and Plan:  Fall from chair, initial encounter -     DG Ribs Unilateral Right; Future -     DG Chest 2 View; Future No LOC, no other injury  Rib pain on right side -     DG Ribs Unilateral Right; Future -     DG Chest 2 View; Future - rib fracture versus contusion- suggest taking deep breathes, hug pillow, take tramadol as needed  Cough -     DG Ribs Unilateral Right; Future -     DG Chest 2 View; Future  Other orders -     traMADol (ULTRAM) 50 MG tablet; 1 pill up to 3 times a day as needed for pain, can be sedating, do not drive and take  The patient was advised to call immediately if she has any concerning symptoms in the interval. The patient voices understanding of current treatment options and is in agreement with the current care plan.The patient knows to call the clinic with any problems, questions or concerns or go to the ER if any further progression of symptoms.    HPI 84 y.o.female with history of osteopenia, afib, HTN chol, CKD stage 2 presents after a fall. She was climbing on a chair on her front porch and she had a mechanical fall, she fell onto her right side/flank on concrete porch. She did not hit her head, no LOC.  She is very sore on her right side, pain with deep breath, has cough this AM but no SOB, CP. States he AB feels swollen this AM, but no nausea, vomiting, appetite is okay, no constipation, diarrhea, BM 2 pm, no blood in stool or urine, no pain with urination.   Patient Active Problem List   Diagnosis Date Noted  . CKD stage 2 due to type 2 diabetes mellitus ([place]) 03/06/2018  . Overweight (BMI 25.0-29.9) 03/06/2018  . Osteopenia 03/06/2018  . Esophageal reflux 09/06/2015  . Vitamin D deficiency 06/08/2013  . Medication management 06/08/2013  . Hypertension   . Hyperlipemia   . Gout   . Type 2 diabetes mellitus with stage 3 chronic kidney disease, without long-term current use of insulin ([place])       Current Outpatient Medications  (Cardiovascular):  .  atenolol (TENORMIN) 50 MG tablet, TAKE 1 TABLET BY MOUTH DAILY. Marland Kitchen  diltiazem (CARDIZEM CD) 240 MG 24 hr capsule, Take 1 capsule Daily for BP .  furosemide (LASIX) 40 MG tablet, Take 1/2 to 1 tablet daily for fluid retention. Marland Kitchen  lisinopril (PRINIVIL,ZESTRIL) 20 MG tablet, TAKE ONE TABLET BY MOUTH ONCE DAILY FOR BLOOD PRESSURE. .  pravastatin (PRAVACHOL) 40 MG tablet, TAKE 1 TABLET BY MOUTH DAILY.   Current Outpatient Medications (Analgesics):  .  allopurinol (ZYLOPRIM) 300 MG tablet, Take 1 tablet Daily to prevent Gout .  aspirin EC 81 MG tablet, Take 81 mg by mouth daily.   Current Outpatient Medications (Other):  .  cholecalciferol (VITAMIN D) 1000 UNITS tablet, Take 5,000 Units by mouth every other day.  .  citalopram (CELEXA) 20 MG tablet, TAKE 1 TABLET (20 MG TOTAL) BY MOUTH DAILY. .  fish oil-omega-3 fatty acids 1000 MG capsule, Take 1 g by mouth daily. Marland Kitchen  FREESTYLE LITE test strip,  .  Lancets (FREESTYLE) lancets,  .  triamcinolone cream (KENALOG) 0.1 %, as needed.   Allergies  Allergen Reactions  . Buspar [Buspirone]     dysphoria  . Lipitor [Atorvastatin]     Myalgias  . Metformin And Related  Gas/bloating  . Nsaids     GI upset  . Sulfa Antibiotics Other (See Comments)    Reaction=throat itching  . Sulfonamide Derivatives     ROS: all negative except above.   Physical Exam: There were no vitals filed for this visit. There were no vitals taken for this visit. General appearance: alert, no distress, WD/WN, female HEENT: normocephalic, sclerae anicteric, TMs pearly, nares patent, no discharge or erythema, pharynx normal Oral cavity: MMM, no lesions Neck: supple, no lymphadenopathy, no thyromegaly, no masses Heart: RRR, normal S1, S2, no murmurs Lungs: CTA bilaterally, no wheezes, rhonchi, or rales Abdomen: +bs, soft, non tender, non distended, no masses, no hepatomegaly, no splenomegaly Musculoskeletal: + tenderness right 10-11th right  ribs, no swelling, no obvious deformity Extremities: no edema, no cyanosis, no clubbing Pulses: 2+ symmetric, upper and lower extremities, normal cap refill Neurological: alert, oriented x 3, CN2-12 intact, strength normal upper extremities and lower extremities, sensation normal throughout, DTRs 2+ throughout, no cerebellar signs, gait normal Psychiatric: normal affect, behavior normal, pleasant    Vicie Mutters, PA-C 1:57 PM [facility]

## 2019-06-03 NOTE — Patient Instructions (Signed)
INFORMATION ABOUT YOUR XRAY  Can walk into [address] for an Insurance account manager. They will have the order and take you back. You do not any paper work, I should get the result back today or tomorrow. This order is good for a year.  Can call [phone] to schedule an appointment if you wish.    Rib Fracture  A rib fracture is a break or crack in one of the bones of the ribs. The ribs are long, curved bones that wrap around your chest and attach to your spine and your breastbone. The ribs protect your heart, lungs, and other organs in the chest. A broken or cracked rib is often painful but is not usually serious. Most rib fractures heal on their own over time. However, rib fractures can be more serious if multiple ribs are broken or if broken ribs move out of place and push against other structures or organs. What are the causes? This condition is caused by:  Repetitive movements with high force, such as pitching a baseball or having severe coughing spells.  A direct blow to the chest, such as a sports injury, a car accident, or a fall.  Cancer that has spread to the bones, which can weaken bones and cause them to break. What are the signs or symptoms? Symptoms of this condition include:  Pain when you breathe in or cough.  Pain when someone presses on the injured area.  Feeling short of breath. How is this diagnosed? This condition is diagnosed with a physical exam and medical history. Imaging tests may also be done, such as:  Chest X-ray.  CT scan.  MRI.  Bone scan.  Chest ultrasound. How is this treated? Treatment for this condition depends on the severity of the fracture. Most rib fractures usually heal on their own in 1-3 months. Sometimes healing takes longer if there is a cough that does not stop or if there are other activities that make the injury worse (aggravating factors). While you heal, you will be given medicines to control the pain. You will also be taught deep  breathing exercises. Severe injuries may require hospitalization or surgery. Follow these instructions at home: Managing pain, stiffness, and swelling  If directed, apply ice to the injured area. ? Put ice in a plastic bag. ? Place a towel between your skin and the bag. ? Leave the ice on for 20 minutes, 2-3 times a day.  Take over-the-counter and prescription medicines only as told by your health care provider. Activity  Avoid a lot of activity and any activities or movements that cause pain. Be careful during activities and avoid bumping the injured rib.  Slowly increase your activity as told by your health care provider. General instructions  Do deep breathing exercises as told by your health care provider. This helps prevent pneumonia, which is a common complication of a broken rib. Your health care provider may instruct you to: ? Take deep breaths several times a day. ? Try to cough several times a day, holding a pillow against the injured area. ? Use a device called incentive spirometer to practice deep breathing several times a day.  Drink enough fluid to keep your urine pale yellow.  Do not wear a rib belt or binder. These restrict breathing, which can lead to pneumonia.  Keep all follow-up visits as told by your health care provider. This is important. Contact a health care provider if:  You have a fever. Get help right away if:  You have difficulty breathing or you are short of breath.  You develop a cough that does not stop, or you cough up thick or bloody sputum.  You have nausea, vomiting, or pain in your abdomen.  Your pain gets worse and medicine does not help. Summary  A rib fracture is a break or crack in one of the bones of the ribs.  A broken or cracked rib is often painful but is not usually serious.  Most rib fractures heal on their own over time.  Treatment for this condition depends on the severity of the fracture.  Avoid a lot of activity and  any activities or movements that cause pain. This information is not intended to replace advice given to you by your health care provider. Make sure you discuss any questions you have with your health care provider. Document Released: 07/16/2005 Document Revised: 06/28/2017 Document Reviewed: 10/15/2016 Elsevier Patient Education  [place].

## 2019-06-04 ENCOUNTER — Ambulatory Visit
Admission: RE | Admit: 2019-06-04 | Discharge: 2019-06-04 | Disposition: A | Discharge status: Home / Self Care | Payer: Medicare Other | Source: Ambulatory Visit | Attending: Physician Assistant | Admitting: Physician Assistant

## 2019-06-04 DIAGNOSIS — W07XXXA Fall from chair, initial encounter: Secondary | ICD-10-CM

## 2019-06-04 DIAGNOSIS — R079 Chest pain, unspecified: Secondary | ICD-10-CM | POA: Diagnosis not present

## 2019-06-04 DIAGNOSIS — S299XXA Unspecified injury of thorax, initial encounter: Secondary | ICD-10-CM | POA: Diagnosis not present

## 2019-06-04 DIAGNOSIS — R0781 Pleurodynia: Secondary | ICD-10-CM | POA: Diagnosis not present

## 2019-06-04 DIAGNOSIS — R05 Cough: Secondary | ICD-10-CM

## 2019-06-04 DIAGNOSIS — R059 Cough, unspecified: Secondary | ICD-10-CM

## 2019-06-05 NOTE — Progress Notes (Signed)
Patient is aware of results. Ruth Delgado

## 2019-06-13 ENCOUNTER — Other Ambulatory Visit: Payer: Self-pay | Admitting: Physician Assistant

## 2019-06-13 ENCOUNTER — Other Ambulatory Visit: Payer: Self-pay | Admitting: Internal Medicine

## 2019-06-15 ENCOUNTER — Other Ambulatory Visit: Payer: Self-pay | Admitting: Adult Health

## 2019-06-16 DIAGNOSIS — I83813 Varicose veins of bilateral lower extremities with pain: Secondary | ICD-10-CM | POA: Diagnosis not present

## 2019-06-16 DIAGNOSIS — I8312 Varicose veins of left lower extremity with inflammation: Secondary | ICD-10-CM | POA: Diagnosis not present

## 2019-06-16 DIAGNOSIS — I8311 Varicose veins of right lower extremity with inflammation: Secondary | ICD-10-CM | POA: Diagnosis not present

## 2019-07-03 DIAGNOSIS — I8311 Varicose veins of right lower extremity with inflammation: Secondary | ICD-10-CM | POA: Diagnosis not present

## 2019-07-06 DIAGNOSIS — I83891 Varicose veins of right lower extremities with other complications: Secondary | ICD-10-CM | POA: Diagnosis not present

## 2019-07-06 DIAGNOSIS — I83811 Varicose veins of right lower extremities with pain: Secondary | ICD-10-CM | POA: Diagnosis not present

## 2019-07-06 DIAGNOSIS — I8311 Varicose veins of right lower extremity with inflammation: Secondary | ICD-10-CM | POA: Diagnosis not present

## 2019-07-08 DIAGNOSIS — I83811 Varicose veins of right lower extremities with pain: Secondary | ICD-10-CM | POA: Diagnosis not present

## 2019-07-08 DIAGNOSIS — I83891 Varicose veins of right lower extremities with other complications: Secondary | ICD-10-CM | POA: Diagnosis not present

## 2019-07-08 DIAGNOSIS — I8311 Varicose veins of right lower extremity with inflammation: Secondary | ICD-10-CM | POA: Diagnosis not present

## 2019-07-09 ENCOUNTER — Ambulatory Visit: Payer: Medicare Other | Admitting: Internal Medicine

## 2019-07-10 DIAGNOSIS — I8312 Varicose veins of left lower extremity with inflammation: Secondary | ICD-10-CM | POA: Diagnosis not present

## 2019-07-10 DIAGNOSIS — I83812 Varicose veins of left lower extremities with pain: Secondary | ICD-10-CM | POA: Diagnosis not present

## 2019-07-13 DIAGNOSIS — I8312 Varicose veins of left lower extremity with inflammation: Secondary | ICD-10-CM | POA: Diagnosis not present

## 2019-07-13 DIAGNOSIS — I83892 Varicose veins of left lower extremities with other complications: Secondary | ICD-10-CM | POA: Diagnosis not present

## 2019-08-05 DIAGNOSIS — E119 Type 2 diabetes mellitus without complications: Secondary | ICD-10-CM | POA: Diagnosis not present

## 2019-08-05 DIAGNOSIS — H11159 Pinguecula, unspecified eye: Secondary | ICD-10-CM | POA: Diagnosis not present

## 2019-08-05 DIAGNOSIS — H5203 Hypermetropia, bilateral: Secondary | ICD-10-CM | POA: Diagnosis not present

## 2019-08-05 DIAGNOSIS — H524 Presbyopia: Secondary | ICD-10-CM | POA: Diagnosis not present

## 2019-08-05 DIAGNOSIS — I10 Essential (primary) hypertension: Secondary | ICD-10-CM | POA: Diagnosis not present

## 2019-08-05 DIAGNOSIS — H52223 Regular astigmatism, bilateral: Secondary | ICD-10-CM | POA: Diagnosis not present

## 2019-08-05 DIAGNOSIS — H25099 Other age-related incipient cataract, unspecified eye: Secondary | ICD-10-CM | POA: Diagnosis not present

## 2019-08-10 ENCOUNTER — Encounter: Payer: Self-pay | Admitting: Internal Medicine

## 2019-08-10 NOTE — Progress Notes (Signed)
History of Present Illness:      This very nice 85 y.o. WWF presents for 6 month follow up with HTN, HLD, T2_NIDDM and Vitamin D Deficiency. Patient has hx/o Gout controlled on her Allopurinol.      Patient is treated for HTN (1991) & BP has been controlled at home. Today's BP is at goal - 136/68. Patient has had no complaints of any cardiac type chest pain, palpitations, dyspnea / orthopnea / PND, dizziness, claudication, or dependent edema.      Hyperlipidemia is controlled with diet & meds. Patient denies myalgias or other med SE's. Last Lipids were at goal except elevated Trig's:  Lab Results  Component Value Date   CHOL 168 04/08/2019   HDL 49 (L) 04/08/2019   LDLCALC 88 04/08/2019   TRIG 216 (H) 04/08/2019   CHOLHDL 3.4 04/08/2019    Also, the patient has history of T2_NIDDM (2007) w /CKD2 and has had no symptoms of reactive hypoglycemia, diabetic polys, paresthesias or visual blurring.  Last A1c was not at goal:  Lab Results  Component Value Date   HGBA1C 6.4 (H) 01/01/2019        Further, the patient also has history of Vitamin D Deficiency ("30" / Apr 2019)  and supplements vitamin D without any suspected side-effects. Last vitamin D was still very low:  Lab Results  Component Value Date   VD25OH 36 01/01/2019    Current Outpatient Medications on File Prior to Visit  Medication Sig  . allopurinol (ZYLOPRIM) 300 MG tablet Take 1 tablet Daily to prevent Gout  . aspirin EC 81 MG tablet Take 81 mg by mouth daily.  Marland Kitchen atenolol (TENORMIN) 50 MG tablet Take 1 tablet Daily for BP  . cholecalciferol (VITAMIN D) 1000 UNITS tablet Take 5,000 Units by mouth every other day.   . citalopram (CELEXA) 20 MG tablet Take 1 tablet Daily for Mood  . diltiazem (CARDIZEM CD) 240 MG 24 hr capsule Take 1 capsule Daily for BP  . fish oil-omega-3 fatty acids 1000 MG capsule Take 1 g by mouth daily.  Marland Kitchen FREESTYLE LITE test strip   . furosemide (LASIX) 40 MG tablet Take 1/2 to 1 tablet  daily for fluid retention.  . Lancets (FREESTYLE) lancets   . lisinopril (ZESTRIL) 20 MG tablet Take 1 tablet Daily for BP  . pravastatin (PRAVACHOL) 40 MG tablet Take 1 tablet at Bedtime for Cholesterol  . triamcinolone cream (KENALOG) 0.1 % as needed.    No current facility-administered medications on file prior to visit.    Allergies  Allergen Reactions  . Buspar [Buspirone]     dysphoria  . Lipitor [Atorvastatin]     Myalgias  . Metformin And Related     Gas/bloating  . Nsaids     GI upset  . Sulfa Antibiotics Other (See Comments)    Reaction=throat itching  . Sulfonamide Derivatives     PMHx:   Past Medical History:  Diagnosis Date  . A-fib ([place])   . Anxiety   . Arthritis   . Diabetes mellitus   . Gout   . Hyperlipemia   . Hypertension    Immunization History  Administered Date(s) Administered  . Influenza Split 07/13/2014  . Influenza, High Dose Seasonal PF 04/14/2015, 04/16/2017, 06/13/2018, 04/08/2019  . Pneumococcal-Unspecified 06/07/2003  . Td 06/02/2007, 11/12/2017  . Zoster 05/30/2012   Past Surgical History:  Procedure Laterality Date  . ABDOMINAL HYSTERECTOMY    . BIOPSY BREAST     (  L) BREAST IN 1993  . KNEE ARTHROSCOPY      FHx:    Reviewed / unchanged  SHx:    Reviewed / unchanged   Systems Review:  Constitutional: Denies fever, chills, wt changes, headaches, insomnia, fatigue, night sweats, change in appetite. Eyes: Denies redness, blurred vision, diplopia, discharge, itchy, watery eyes.  ENT: Denies discharge, congestion, post nasal drip, epistaxis, sore throat, earache, hearing loss, dental pain, tinnitus, vertigo, sinus pain, snoring.  CV: Denies chest pain, palpitations, irregular heartbeat, syncope, dyspnea, diaphoresis, orthopnea, PND, claudication or edema. Respiratory: denies cough, dyspnea, DOE, pleurisy, hoarseness, laryngitis, wheezing.  Gastrointestinal: Denies dysphagia, odynophagia, heartburn, reflux, water brash, abdominal  pain or cramps, nausea, vomiting, bloating, diarrhea, constipation, hematemesis, melena, hematochezia  or hemorrhoids. Genitourinary: Denies dysuria, frequency, urgency, nocturia, hesitancy, discharge, hematuria or flank pain. Musculoskeletal: Denies arthralgias, myalgias, stiffness, jt. swelling, pain, limping or strain/sprain.  Skin: Denies pruritus, rash, hives, warts, acne, eczema or change in skin lesion(s). Neuro: No weakness, tremor, incoordination, spasms, paresthesia or pain. Psychiatric: Denies confusion, memory loss or sensory loss. Endo: Denies change in weight, skin or hair change.  Heme/Lymph: No excessive bleeding, bruising or enlarged lymph nodes.  Physical Exam  BP 136/68   Pulse 60   Temp (!) 96.9 F (36.1 C)   Resp 16   Ht 5' 4.5" (1.638 m)   Wt 164 lb (74.4 kg)   BMI 27.72 kg/m   Appears  well nourished, well groomed  and in no distress.  Eyes: PERRLA, EOMs, conjunctiva no swelling or erythema. Sinuses: No frontal/maxillary tenderness ENT/Mouth: EAC's clear, TM's nl w/o erythema, bulging. Nares clear w/o erythema, swelling, exudates. Oropharynx clear without erythema or exudates. Oral hygiene is good. Tongue normal, non obstructing. Hearing intact.  Neck: Supple. Thyroid not palpable. Car 2+/2+ without bruits, nodes or JVD. Chest: Respirations nl with BS clear & equal w/o rales, rhonchi, wheezing or stridor.  Cor: Heart sounds normal w/ regular rate and rhythm without sig. murmurs, gallops, clicks or rubs. Peripheral pulses normal and equal  without edema.  Abdomen: Soft & bowel sounds normal. Non-tender w/o guarding, rebound, hernias, masses or organomegaly.  Lymphatics: Unremarkable.  Musculoskeletal: Full ROM all peripheral extremities, joint stability, 5/5 strength and normal gait.  Skin: Warm, dry without exposed rashes, lesions or ecchymosis apparent.  Neuro: Cranial nerves intact, reflexes equal bilaterally. Sensory-motor testing grossly intact. Tendon  reflexes grossly intact.  Pysch: Alert & oriented x 3.  Insight and judgement nl & appropriate. No ideations.  Assessment and Plan:  1. Essential hypertension  - Continue medication, monitor blood pressure at home.  - Continue DASH diet.  Reminder to go to the ER if any CP,  SOB, nausea, dizziness, severe HA, changes vision/speech.   - CBC with Diff - COMPLETE METABOLIC PANEL WITH GFR - Magnesium - TSH  2. Hyperlipidemia associated with type 2 diabetes mellitus ([place])  - Continue diet/meds, exercise,& lifestyle modifications.  - Continue monitor periodic cholesterol/liver & renal functions   - Lipid Profile - TSH  3. Diabetes mellitus due to underlying condition with stage 2 chronic  kidney disease, without long-term current use of insulin ([facility])  - Continue diet, exercise  - Lifestyle modifications.  - Monitor appropriate labs.  - Hemoglobin A1c ([facility]) - Insulin, random  4. Vitamin D deficiency  - Continue supplementation.  - Vitamin D (25 hydroxy)  5. Idiopathic gout, unspecified chronicity, unspecified site  - Uric acid  6. Medication management  - CBC with Diff - COMPLETE METABOLIC PANEL WITH  GFR - Magnesium - Lipid Profile - TSH - Hemoglobin A1c ([facility]) - Insulin, random - Vitamin D (25 hydroxy) - Uric acid        Discussed  regular exercise, BP monitoring, weight control to achieve/maintain BMI less than 25 and discussed med and SE's. Recommended labs to assess and monitor clinical status with further disposition pending results of labs.  I discussed the assessment and treatment plan with the patient. The patient was provided an opportunity to ask questions and all were answered. The patient agreed with the plan and demonstrated an understanding of the instructions.  I provided over 30 minutes of exam, counseling, chart review and  complex critical decision making.  Kirtland Bouchard, MD

## 2019-08-10 NOTE — Patient Instructions (Signed)
- Link to sign up at [place] Web site for the Covid[place]19 vaccine   [email or web address]   Or call [phone]  ++++++++++++++++++++++++++++++++++  Vit D  & Vit C 1,000 mg   are recommended to help protect  against the Covid-19 and other Corona viruses.    Also it's recommended  to take  Zinc 50 mg  to help  protect against the Covid-19   and best place to get  is also on [employer].com  and don't pay more than 6-8 cents /pill !   ===================================== Coronavirus (COVID-19) Are you at risk?  Are you at risk for the Coronavirus (COVID-19)?  To be considered HIGH RISK for Coronavirus (COVID-19), you have to meet the following criteria:  . Traveled to [place], [place], [place], [place] or [place]; or in the [place] to [place], [place], [place]  . or [place]; and have fever, cough, and shortness of breath within the last 2 weeks of travel OR . Been in close contact with a person diagnosed with COVID-19 within the last 2 weeks and have  . fever, cough,and shortness of breath .  . IF YOU DO NOT MEET THESE CRITERIA, YOU ARE CONSIDERED LOW RISK FOR COVID-19.  What to do if you are HIGH RISK for COVID-19?  . If you are having a medical emergency, call 911. . Seek medical care right away. Before you go to a doctor's office, urgent care or emergency department, .  call ahead and tell them about your recent travel, contact with someone diagnosed with COVID-19  .  and your symptoms.  . You should receive instructions from your physician's office regarding next steps of care.  . When you arrive at healthcare provider, tell the healthcare staff immediately you have returned from  . visiting [place], [place], [place], [place] or [place]; or traveled in the [place] to [place], [place],  . [place] or [place] in the last  two weeks or you have been in close contact with a person diagnosed with  . COVID-19 in the last 2 weeks.   . Tell the health care staff about your symptoms: fever, cough and shortness of breath. . After you have been seen by a medical provider, you will be either: o Tested for (COVID-19) and discharged home on quarantine except to seek medical care if  o symptoms worsen, and asked to  - Stay home and avoid contact with others until you get your results (4-5 days)  - Avoid travel on public transportation if possible (such as bus, train, or airplane) or o Sent to the Emergency Department by EMS for evaluation, COVID-19 testing  and  o possible admission depending on your condition and test results.  What to do if you are LOW RISK for COVID-19?  Reduce your risk of any infection by using the same precautions used for avoiding the common cold or flu:  . Wash your hands often with soap and warm water for at least 20 seconds.  If soap and water are not readily available,  . use an alcohol-based hand sanitizer with at least 60% alcohol.  . If coughing or sneezing, cover your mouth and nose by coughing or sneezing into the elbow areas of your shirt or coat, .  into a tissue or into your sleeve (not your hands). . Avoid shaking hands with others and consider head nods or verbal greetings only. . Avoid touching your eyes, nose, or mouth with unwashed   hands.  . Avoid close contact with people who are sick. . Avoid places or events with large numbers of people in one location, like concerts or sporting events. . Carefully consider travel plans you have or are making. . If you are planning any travel outside or inside the [place], visit the CDC's Travelers' Health webpage for the latest health notices. . If you have some symptoms but not all symptoms, continue to monitor at home and seek medical attention  . if your symptoms worsen. . If you are having a medical emergency, call  911.   ++++++++++++++++++++++++++++++++ Recommend Adult Low Dose Aspirin or  coated  Aspirin 81 mg daily  To reduce risk of Colon Cancer 40 %,  Skin Cancer 26 % ,  Melanoma 46%  and  Pancreatic cancer 60% ++++++++++++++++++++++++++++++++ Vitamin D goal  is between 70-100.  Please make sure that you are taking your Vitamin D as directed.  It is very important as a natural anti-inflammatory  helping hair, skin, and nails, as well as reducing stroke and heart attack risk.  It helps your bones and helps with mood. It also decreases numerous cancer risks so please take it as directed.  Low Vit D is associated with a 200-300% higher risk for CANCER  and 200-300% higher risk for HEART   ATTACK  &  STROKE.   ...................................... It is also associated with higher death rate at younger ages,  autoimmune diseases like Rheumatoid arthritis, Lupus, Multiple Sclerosis.    Also many other serious conditions, like depression, Alzheimer's Dementia, infertility, muscle aches, fatigue, fibromyalgia - just to name a few. ++++++++++++++++++++ Recommend the book "The END of DIETING" by Dr Joel Fuhrman  & the book "The END of DIABETES " by Dr Joel Fuhrman At [employer].com - get book & Audio CD's    Being diabetic has a  300% increased risk for heart attack, stroke, cancer, and alzheimer- type vascular dementia. It is very important that you work harder with diet by avoiding all foods that are white. Avoid white rice (brown & wild rice is OK), white potatoes (sweetpotatoes in moderation is OK), White bread or wheat bread or anything made out of white flour like bagels, donuts, rolls, buns, biscuits, cakes, pastries, cookies, pizza crust, and pasta (made from white flour & egg whites) - vegetarian pasta or spinach or wheat pasta is OK. Multigrain breads like Arnold's or Pepperidge Farm, or multigrain sandwich thins or flatbreads.  Diet, exercise and weight loss can reverse and cure diabetes in  the early stages.  Diet, exercise and weight loss is very important in the control and prevention of complications of diabetes which affects every system in your body, ie. Brain - dementia/stroke, eyes - glaucoma/blindness, heart - heart attack/heart failure, kidneys - dialysis, stomach - gastric paralysis, intestines - malabsorption, nerves - severe painful neuritis, circulation - gangrene & loss of a leg(s), and finally cancer and Alzheimers.    I recommend avoid fried & greasy foods,  sweets/candy, white rice (brown or wild rice or Quinoa is OK), white potatoes (sweet potatoes are OK) - anything made from white flour - bagels, doughnuts, rolls, buns, biscuits,white and wheat breads, pizza crust and traditional pasta made of white flour & egg white(vegetarian pasta or spinach or wheat pasta is OK).  Multi-grain bread is OK - like multi-grain flat bread or sandwich thins. Avoid alcohol in excess. Exercise is also important.    Eat all the vegetables you want - avoid meat, especially red meat and   dairy - especially cheese.  Cheese is the most concentrated form of trans-fats which is the worst thing to clog up our arteries. Veggie cheese is OK which can be found in the fresh produce section at [employer] or [employer] or [employer]  +++++++++++++++++++++ DASH Eating Plan  DASH stands for "Dietary Approaches to Stop Hypertension."   The DASH eating plan is a healthy eating plan that has been shown to reduce high blood pressure (hypertension). Additional health benefits may include reducing the risk of type 2 diabetes mellitus, heart disease, and stroke. The DASH eating plan may also help with weight loss. WHAT DO I NEED TO KNOW ABOUT THE DASH EATING PLAN? For the DASH eating plan, you will follow these general guidelines:  Choose foods with a percent daily value for sodium of less than 5% (as listed on the food label).  Use salt-free seasonings or herbs instead of table salt or sea salt.  Check  with your health care provider or pharmacist before using salt substitutes.  Eat lower-sodium products, often labeled as "lower sodium" or "no salt added."  Eat fresh foods.  Eat more vegetables, fruits, and low-fat dairy products.  Choose whole grains. Look for the word "whole" as the first word in the ingredient list.  Choose fish   Limit sweets, desserts, sugars, and sugary drinks.  Choose heart-healthy fats.  Eat veggie cheese   Eat more home-cooked food and less restaurant, buffet, and fast food.  Limit fried foods.  [place] foods using methods other than frying.  Limit canned vegetables. If you do use them, rinse them well to decrease the sodium.  When eating at a restaurant, ask that your food be prepared with less salt, or no salt if possible.                      WHAT FOODS CAN I EAT? Read Dr Joel Fuhrman's books on The End of Dieting & The End of Diabetes  Grains Whole grain or whole wheat bread. Brown rice. Whole grain or whole wheat pasta. Quinoa, bulgur, and whole grain cereals. Low-sodium cereals. Corn or whole wheat flour tortillas. Whole grain cornbread. Whole grain crackers. Low-sodium crackers.  Vegetables Fresh or frozen vegetables (raw, steamed, roasted, or grilled). Low-sodium or reduced-sodium tomato and vegetable juices. Low-sodium or reduced-sodium tomato sauce and paste. Low-sodium or reduced-sodium canned vegetables.   Fruits All fresh, canned (in natural juice), or frozen fruits.  Protein Products  All fish and seafood.  Dried beans, peas, or lentils. Unsalted nuts and seeds. Unsalted canned beans.  Dairy Low-fat dairy products, such as skim or 1% milk, 2% or reduced-fat cheeses, low-fat ricotta or cottage cheese, or plain low-fat yogurt. Low-sodium or reduced-sodium cheeses.  Fats and Oils Tub margarines without trans fats. Light or reduced-fat mayonnaise and salad dressings (reduced sodium). Avocado. Safflower, olive, or canola oils. Natural  peanut or almond butter.  Other Unsalted popcorn and pretzels. The items listed above may not be a complete list of recommended foods or beverages. Contact your dietitian for more options.  +++++++++++++++  WHAT FOODS ARE NOT RECOMMENDED? Grains/ White flour or wheat flour White bread. White pasta. White rice. Refined cornbread. Bagels and croissants. Crackers that contain trans fat.  Vegetables  Creamed or fried vegetables. Vegetables in a . Regular canned vegetables. Regular canned tomato sauce and paste. Regular tomato and vegetable juices.  Fruits Dried fruits. Canned fruit in light or heavy syrup. Fruit juice.  Meat and Other Protein Products Meat   in general - RED meat & White meat.  Fatty cuts of meat. Ribs, chicken wings, all processed meats as bacon, sausage, bologna, salami, fatback, hot dogs, bratwurst and packaged luncheon meats.  Dairy Whole or 2% milk, cream, half-and-half, and cream cheese. Whole-fat or sweetened yogurt. Full-fat cheeses or blue cheese. Non-dairy creamers and whipped toppings. Processed cheese, cheese spreads, or cheese curds.  Condiments Onion and garlic salt, seasoned salt, table salt, and sea salt. Canned and packaged gravies. Worcestershire sauce. Tartar sauce. Barbecue sauce. Teriyaki sauce. Soy sauce, including reduced sodium. Steak sauce. Fish sauce. Oyster sauce. Cocktail sauce. Horseradish. Ketchup and mustard. Meat flavorings and tenderizers. Bouillon cubes. Hot sauce. Tabasco sauce. Marinades. Taco seasonings. Relishes.  Fats and Oils Butter, stick margarine, lard, shortening and bacon fat. Coconut, palm kernel, or palm oils. Regular salad dressings.  Pickles and olives. Salted popcorn and pretzels.  The items listed above may not be a complete list of foods and beverages to avoid.   

## 2019-08-11 ENCOUNTER — Ambulatory Visit (INDEPENDENT_AMBULATORY_CARE_PROVIDER_SITE_OTHER): Payer: Medicare Other | Admitting: Internal Medicine

## 2019-08-11 ENCOUNTER — Other Ambulatory Visit: Payer: Self-pay

## 2019-08-11 VITALS — BP 136/68 | HR 60 | Temp 96.9°F | Resp 16 | Ht 64.5 in | Wt 164.0 lb

## 2019-08-11 DIAGNOSIS — I10 Essential (primary) hypertension: Secondary | ICD-10-CM | POA: Diagnosis not present

## 2019-08-11 DIAGNOSIS — N182 Chronic kidney disease, stage 2 (mild): Secondary | ICD-10-CM | POA: Diagnosis not present

## 2019-08-11 DIAGNOSIS — E1169 Type 2 diabetes mellitus with other specified complication: Secondary | ICD-10-CM

## 2019-08-11 DIAGNOSIS — M1000 Idiopathic gout, unspecified site: Secondary | ICD-10-CM

## 2019-08-11 DIAGNOSIS — E559 Vitamin D deficiency, unspecified: Secondary | ICD-10-CM

## 2019-08-11 DIAGNOSIS — E785 Hyperlipidemia, unspecified: Secondary | ICD-10-CM | POA: Diagnosis not present

## 2019-08-11 DIAGNOSIS — E0822 Diabetes mellitus due to underlying condition with diabetic chronic kidney disease: Secondary | ICD-10-CM | POA: Diagnosis not present

## 2019-08-11 DIAGNOSIS — Z79899 Other long term (current) drug therapy: Secondary | ICD-10-CM | POA: Diagnosis not present

## 2019-08-12 LAB — CBC WITH DIFFERENTIAL/PLATELET
Absolute Monocytes: 442 cells/uL (ref 200–950)
Basophils Absolute: 67 cells/uL (ref 0–200)
Basophils Relative: 1 %
Eosinophils Absolute: 80 cells/uL (ref 15–500)
Eosinophils Relative: 1.2 %
HCT: 38.2 % (ref 35.0–45.0)
Hemoglobin: 12.9 g/dL (ref 11.7–15.5)
Lymphs Abs: 2258 cells/uL (ref 850–3900)
MCH: 32.5 pg (ref 27.0–33.0)
MCHC: 33.8 g/dL (ref 32.0–36.0)
MCV: 96.2 fL (ref 80.0–100.0)
MPV: 10.8 fL (ref 7.5–12.5)
Monocytes Relative: 6.6 %
Neutro Abs: 3853 cells/uL (ref 1500–7800)
Neutrophils Relative %: 57.5 %
Platelets: 185 10*3/uL (ref 140–400)
RBC: 3.97 10*6/uL (ref 3.80–5.10)
RDW: 13 % (ref 11.0–15.0)
Total Lymphocyte: 33.7 %
WBC: 6.7 10*3/uL (ref 3.8–10.8)

## 2019-08-12 LAB — COMPLETE METABOLIC PANEL WITH GFR
AG Ratio: 1.8 (calc) (ref 1.0–2.5)
ALT: 14 U/L (ref 6–29)
AST: 17 U/L (ref 10–35)
Albumin: 4.1 g/dL (ref 3.6–5.1)
Alkaline phosphatase (APISO): 52 U/L (ref 37–153)
BUN/Creatinine Ratio: 29 (calc) — ABNORMAL HIGH (ref 6–22)
BUN: 26 mg/dL — ABNORMAL HIGH (ref 7–25)
CO2: 29 mmol/L (ref 20–32)
Calcium: 9.6 mg/dL (ref 8.6–10.4)
Chloride: 105 mmol/L (ref 98–110)
Creat: 0.9 mg/dL — ABNORMAL HIGH (ref 0.60–0.88)
GFR, Est African American: 69 mL/min/{1.73_m2} (ref >=60)
GFR, Est Non African American: 60 mL/min/{1.73_m2} (ref >=60)
Globulin: 2.3 g/dL (calc) (ref 1.9–3.7)
Glucose, Bld: 125 mg/dL — ABNORMAL HIGH (ref 65–99)
Potassium: 4.6 mmol/L (ref 3.5–5.3)
Sodium: 141 mmol/L (ref 135–146)
Total Bilirubin: 0.4 mg/dL (ref 0.2–1.2)
Total Protein: 6.4 g/dL (ref 6.1–8.1)

## 2019-08-12 LAB — HEMOGLOBIN A1C
Hgb A1c MFr Bld: 6.5 % of total Hgb — ABNORMAL HIGH (ref <5.7)
Mean Plasma Glucose: 140 (calc)
eAG (mmol/L): 7.7 (calc)

## 2019-08-12 LAB — TSH: TSH: 1.94 mIU/L (ref 0.40–4.50)

## 2019-08-12 LAB — INSULIN, RANDOM: Insulin: 35 u[IU]/mL — ABNORMAL HIGH

## 2019-08-12 LAB — VITAMIN D 25 HYDROXY (VIT D DEFICIENCY, FRACTURES): Vit D, 25-Hydroxy: 33 ng/mL (ref 30–100)

## 2019-08-12 LAB — LIPID PANEL
Cholesterol: 162 mg/dL (ref <200)
HDL: 57 mg/dL (ref >=50)
LDL Cholesterol (Calc): 82 mg/dL (calc)
Non-HDL Cholesterol (Calc): 105 mg/dL (calc) (ref <130)
Total CHOL/HDL Ratio: 2.8 (calc) (ref <5.0)
Triglycerides: 136 mg/dL (ref <150)

## 2019-08-12 LAB — URIC ACID: Uric Acid, Serum: 4.6 mg/dL (ref 2.5–7.0)

## 2019-08-12 LAB — MAGNESIUM: Magnesium: 2.1 mg/dL (ref 1.5–2.5)

## 2019-08-13 DIAGNOSIS — I83891 Varicose veins of right lower extremities with other complications: Secondary | ICD-10-CM | POA: Diagnosis not present

## 2019-08-13 DIAGNOSIS — I8311 Varicose veins of right lower extremity with inflammation: Secondary | ICD-10-CM | POA: Diagnosis not present

## 2019-08-17 DIAGNOSIS — D229 Melanocytic nevi, unspecified: Secondary | ICD-10-CM | POA: Diagnosis not present

## 2019-08-17 DIAGNOSIS — L570 Actinic keratosis: Secondary | ICD-10-CM | POA: Diagnosis not present

## 2019-08-17 DIAGNOSIS — D1801 Hemangioma of skin and subcutaneous tissue: Secondary | ICD-10-CM | POA: Diagnosis not present

## 2019-08-17 DIAGNOSIS — L821 Other seborrheic keratosis: Secondary | ICD-10-CM | POA: Diagnosis not present

## 2019-08-17 DIAGNOSIS — L84 Corns and callosities: Secondary | ICD-10-CM | POA: Diagnosis not present

## 2019-08-17 DIAGNOSIS — L814 Other melanin hyperpigmentation: Secondary | ICD-10-CM | POA: Diagnosis not present

## 2019-08-17 DIAGNOSIS — L819 Disorder of pigmentation, unspecified: Secondary | ICD-10-CM | POA: Diagnosis not present

## 2019-08-25 DIAGNOSIS — I83811 Varicose veins of right lower extremities with pain: Secondary | ICD-10-CM | POA: Diagnosis not present

## 2019-08-25 DIAGNOSIS — M7981 Nontraumatic hematoma of soft tissue: Secondary | ICD-10-CM | POA: Diagnosis not present

## 2019-08-25 DIAGNOSIS — I8311 Varicose veins of right lower extremity with inflammation: Secondary | ICD-10-CM | POA: Diagnosis not present

## 2019-09-09 DIAGNOSIS — I8312 Varicose veins of left lower extremity with inflammation: Secondary | ICD-10-CM | POA: Diagnosis not present

## 2019-09-09 DIAGNOSIS — I83892 Varicose veins of left lower extremities with other complications: Secondary | ICD-10-CM | POA: Diagnosis not present

## 2019-09-28 DIAGNOSIS — I83892 Varicose veins of left lower extremities with other complications: Secondary | ICD-10-CM | POA: Diagnosis not present

## 2019-09-28 DIAGNOSIS — I8312 Varicose veins of left lower extremity with inflammation: Secondary | ICD-10-CM | POA: Diagnosis not present

## 2019-10-12 DIAGNOSIS — I8312 Varicose veins of left lower extremity with inflammation: Secondary | ICD-10-CM | POA: Diagnosis not present

## 2019-10-12 DIAGNOSIS — M7981 Nontraumatic hematoma of soft tissue: Secondary | ICD-10-CM | POA: Diagnosis not present

## 2019-11-09 NOTE — Progress Notes (Signed)
FOLLOW UP 3 MONTH  Assessment:   Ruth Delgado was seen today for follow-up.  Diagnoses and all orders for this visit:  Essential hypertension Continue current medications: Atenolol 50mg , half tab in am & 1/2 tab in pm. Cardizem 240mg  daily, lasix one tablet, lisinopril 20mg  daily  Monitor blood pressure at home; call if consistently over 130/80 Continue DASH diet.   Reminder to go to the ER if any CP, SOB, nausea, dizziness, severe HA, changes vision/speech, left arm numbness and tingling and jaw pain. -     CBC with Differential/Platelet -     COMPLETE METABOLIC PANEL WITH GFR  Vitamin D deficiency Continue supplementation Taking Vitamin D 5,000IU daily  Diabetes mellitus due to underlying condition with stage 2 chronic kidney disease, without long-term current use of insulin (HCC) Controlled by lifestyle modification Follow with Dr Chalmers Cater for this  Idiopathic gout, unspecified chronicity, unspecified site Continue allopurinol 300mg  daily No recent flares Discussed dietary modifications Continue to monitor  Hyperlipidemia associated with type 2 diabetes mellitus ([place]) Continue medications: Pravachol 1/2 tab nightly & Fish Oil 1,000mg  daily Discussed dietary and exercise modifications Low fat diet -     Lipid panel  Paroxysmal atrial fibrillation (HCC)  Rate controlled on cardizem asymptomatic  CHADSVASC of 5,  No anticoagulation, bASA Discussed risks of stroke and hospital precautions Will continue to monitor  Gastroesophageal reflux disease without esophagitis Doing well at this time Controlled with lifestyle modifications Continue to monitor  CKD stage 2 due to type 2 diabetes mellitus (HCC) Increase fluids  Avoid NSAIDS Blood pressure control Monitor sugars  Will continue to monitor  Osteopenia of multiple sites DEXA UTD, next 2022 Continue Calcium and Vitamin D Weight bearing exercises Medication management Continued   Over 30 minutes of face to face  interview, exam, counseling, chart review and critical decision making was performed.  Future Appointments  Date Time Provider Jal  02/11/2020 11:00 AM Unk Pinto, MD [facility] None  05/09/2020 11:15 AM Vicie Mutters, PA-C [facility] None    Subjective:  Ruth Delgado is a 85 y.o. female who presents for  3 month follow up for HTN, HLD, GERD, DMII lifestyle controlled with CKD, vitamin D defciency and weight   She has varicose veins and she is seeing Vascular for this.  She does where compression stockings.  Last OV was November.  Some seasonal allergies but not enough to take medications.  Use OTC products if needed.   BMI is Body mass index is 28.05 kg/m., she has been working on diet and exercise. Wt Readings from Last 3 Encounters:  11/10/19 166 lb (75.3 kg)  08/11/19 164 lb (74.4 kg)  06/03/19 161 lb 3.2 oz (73.1 kg)   Her blood pressure has been controlled at home, today their BP is BP: 138/72  She does workout by means of walking 44min a day with her dog.. She denies chest pain, shortness of breath, dizziness. She has a history of Afib, rate controlled, low risk for stroke, patient declines anticoagulation.    She is on cholesterol medication (pravastatin 40 mg, half tab daily) and denies myalgias. Her cholesterol is at goal. The cholesterol last visit was:   Lab Results  Component Value Date   CHOL 162 08/11/2019   HDL 57 08/11/2019   LDLCALC 82 08/11/2019   TRIG 136 08/11/2019   CHOLHDL 2.8 08/11/2019   She has been working on diet and exercise for Diabetes with diabetic chronic kidney disease, she is on bASA, she is on  ACE/ARB, follows with Dr. Chalmers Cater follows once a year and denies increased appetite, nausea, paresthesia of the feet and polydipsia. She checks glucose religiously and fasting values run 110s-120s. Last A1C was:  Lab Results  Component Value Date   HGBA1C 6.5 (H) 08/11/2019   Last GFR: Lab Results  Component Value Date    GFRNONAA 60 08/11/2019   Patient is on Vitamin D supplement.   Lab Results  Component Value Date   VD25OH 33 08/11/2019     Patient is on allopurinol for gout and does not report a recent flare.  Lab Results  Component Value Date   LABURIC 4.6 08/11/2019    Medication Review: Current Outpatient Medications on File Prior to Visit  Medication Sig Dispense Refill  . allopurinol (ZYLOPRIM) 300 MG tablet Take 1 tablet Daily to prevent Gout 90 tablet 3  . aspirin EC 81 MG tablet Take 81 mg by mouth daily.    Marland Kitchen atenolol (TENORMIN) 50 MG tablet Take 1 tablet Daily for BP 90 tablet 3  . cholecalciferol (VITAMIN D) 1000 UNITS tablet Take 5,000 Units by mouth every other day.     . citalopram (CELEXA) 20 MG tablet Take 1 tablet Daily for Mood 90 tablet 3  . diltiazem (CARDIZEM CD) 240 MG 24 hr capsule Take 1 capsule Daily for BP 90 capsule 3  . fish oil-omega-3 fatty acids 1000 MG capsule Take 1 g by mouth daily.    Marland Kitchen FREESTYLE LITE test strip   12  . furosemide (LASIX) 40 MG tablet Take 1/2 to 1 tablet daily for fluid retention.    . Lancets (FREESTYLE) lancets   12  . lisinopril (ZESTRIL) 20 MG tablet Take 1 tablet Daily for BP 90 tablet 3  . pravastatin (PRAVACHOL) 40 MG tablet Take 1 tablet at Bedtime for Cholesterol 90 tablet 3  . triamcinolone cream (KENALOG) 0.1 % as needed.   2   No current facility-administered medications on file prior to visit.    Allergies  Allergen Reactions  . Buspar [Buspirone]     dysphoria  . Lipitor [Atorvastatin]     Myalgias  . Metformin And Related     Gas/bloating  . Nsaids     GI upset  . Sulfa Antibiotics Other (See Comments)    Reaction=throat itching  . Sulfonamide Derivatives     Current Problems (verified) Patient Active Problem List   Diagnosis Date Noted  . CKD stage 2 due to type 2 diabetes mellitus ([place]) 03/06/2018  . Overweight (BMI 25.0-29.9) 03/06/2018  . Osteopenia 03/06/2018  . Esophageal reflux 09/06/2015  . Vitamin D  deficiency 06/08/2013  . Medication management 06/08/2013  . Hypertension   . Hyperlipemia   . Gout   . Type 2 diabetes mellitus with stage 3 chronic kidney disease, without long-term current use of insulin (HCC)     Screening Tests Immunization History  Administered Date(s) Administered  . Influenza Split 07/13/2014  . Influenza, High Dose Seasonal PF 04/14/2015, 04/16/2017, 06/13/2018, 04/08/2019  . Pneumococcal-Unspecified 06/07/2003  . Td 06/02/2007, 11/12/2017  . Zoster 05/30/2012   Cologuard + 11/2017 results in colonoscopy Last mammogram: 02/2019 DEXA:02/2019 osteopenia very close to osteoporosis  Prior vaccinations: TD or Tdap: 2013  Influenza: Due for 2021 Pneumococcal: 2008 Prevnar13: declines Shingles/Zostavax: 2013 [place]  08/21/2019 & 09/11/2019   Names of Other Physician/Practitioners you currently use: 1. [facility] here for primary care 2. Dr. Einar Gip, eye doctor, last visit 11/12/2017, Due 3. Dr.  Civils, dentist, last visit 2019, Due  Patient Care Team: Unk Pinto, MD as PCP - General (Internal Medicine) Jacelyn Pi, MD as Consulting Physician (Endocrinology)  SURGICAL HISTORY She  has a past surgical history that includes Knee arthroscopy; Abdominal hysterectomy; and Biopsy breast. FAMILY HISTORY Her family history includes CVA in her father; Diabetes in her mother; Hypertension in her father. SOCIAL HISTORY She  reports that she quit smoking about 13 years ago. She has never used smokeless tobacco. She reports current alcohol use of about 2.0 standard drinks of alcohol per week.     Objective:     Today's Vitals   11/10/19 1051  BP: 138/72  Pulse: 62  Temp: 97.7 F (36.5 C)  SpO2: 99%  Weight: 166 lb (75.3 kg)  Height: 5' 4.5" (1.638 m)   Body mass index is 28.05 kg/m.  General appearance: alert, no distress, WD/WN, female HEENT: normocephalic, sclerae anicteric, TMs  pearly, nares patent, no discharge or erythema, pharynx normal Oral cavity: MMM, no lesions Neck: supple, no lymphadenopathy, no thyromegaly, no masses Heart: RRR, normal S1, S2, no murmurs Lungs: CTA bilaterally, no wheezes, rhonchi, or rales Abdomen: +bs, soft, non tender, non distended, no masses, no hepatomegaly, no splenomegaly Musculoskeletal: nontender, no swelling, no obvious deformity Extremities: no edema, no cyanosis, no clubbing Pulses: 2+ symmetric, upper and lower extremities, normal cap refill Neurological: alert, oriented x 3, CN2-12 intact, strength normal upper extremities and lower extremities, sensation normal throughout, DTRs 2+ throughout, no cerebellar signs, gait normal Psychiatric: normal affect, behavior normal, pleasant     Garnet Sierras, NP   11/10/2019

## 2019-11-10 ENCOUNTER — Ambulatory Visit (INDEPENDENT_AMBULATORY_CARE_PROVIDER_SITE_OTHER): Payer: Medicare Other | Admitting: Adult Health Nurse Practitioner

## 2019-11-10 ENCOUNTER — Other Ambulatory Visit: Payer: Self-pay

## 2019-11-10 ENCOUNTER — Encounter: Payer: Self-pay | Admitting: Adult Health Nurse Practitioner

## 2019-11-10 VITALS — BP 138/72 | HR 62 | Temp 97.7°F | Ht 64.5 in | Wt 166.0 lb

## 2019-11-10 DIAGNOSIS — N182 Chronic kidney disease, stage 2 (mild): Secondary | ICD-10-CM | POA: Diagnosis not present

## 2019-11-10 DIAGNOSIS — Z79899 Other long term (current) drug therapy: Secondary | ICD-10-CM | POA: Diagnosis not present

## 2019-11-10 DIAGNOSIS — E559 Vitamin D deficiency, unspecified: Secondary | ICD-10-CM

## 2019-11-10 DIAGNOSIS — I10 Essential (primary) hypertension: Secondary | ICD-10-CM | POA: Diagnosis not present

## 2019-11-10 DIAGNOSIS — I480 Paroxysmal atrial fibrillation: Secondary | ICD-10-CM | POA: Diagnosis not present

## 2019-11-10 DIAGNOSIS — E1122 Type 2 diabetes mellitus with diabetic chronic kidney disease: Secondary | ICD-10-CM

## 2019-11-10 DIAGNOSIS — M1000 Idiopathic gout, unspecified site: Secondary | ICD-10-CM | POA: Diagnosis not present

## 2019-11-10 DIAGNOSIS — E0822 Diabetes mellitus due to underlying condition with diabetic chronic kidney disease: Secondary | ICD-10-CM

## 2019-11-10 DIAGNOSIS — E785 Hyperlipidemia, unspecified: Secondary | ICD-10-CM

## 2019-11-10 DIAGNOSIS — E1169 Type 2 diabetes mellitus with other specified complication: Secondary | ICD-10-CM | POA: Diagnosis not present

## 2019-11-10 DIAGNOSIS — K219 Gastro-esophageal reflux disease without esophagitis: Secondary | ICD-10-CM

## 2019-11-10 DIAGNOSIS — M8589 Other specified disorders of bone density and structure, multiple sites: Secondary | ICD-10-CM | POA: Diagnosis not present

## 2019-11-10 LAB — CBC WITH DIFFERENTIAL/PLATELET
Absolute Monocytes: 471 cells/uL (ref 200–950)
Basophils Absolute: 37 cells/uL (ref 0–200)
Basophils Relative: 0.6 %
Eosinophils Absolute: 99 cells/uL (ref 15–500)
Eosinophils Relative: 1.6 %
HCT: 40.1 % (ref 35.0–45.0)
Hemoglobin: 13.3 g/dL (ref 11.7–15.5)
Lymphs Abs: 2089 cells/uL (ref 850–3900)
MCH: 32.4 pg (ref 27.0–33.0)
MCHC: 33.2 g/dL (ref 32.0–36.0)
MCV: 97.8 fL (ref 80.0–100.0)
MPV: 10.8 fL (ref 7.5–12.5)
Monocytes Relative: 7.6 %
Neutro Abs: 3503 cells/uL (ref 1500–7800)
Neutrophils Relative %: 56.5 %
Platelets: 192 10*3/uL (ref 140–400)
RBC: 4.1 10*6/uL (ref 3.80–5.10)
RDW: 13.5 % (ref 11.0–15.0)
Total Lymphocyte: 33.7 %
WBC: 6.2 10*3/uL (ref 3.8–10.8)

## 2019-11-10 LAB — COMPLETE METABOLIC PANEL WITH GFR
AG Ratio: 1.8 (calc) (ref 1.0–2.5)
ALT: 12 U/L (ref 6–29)
AST: 16 U/L (ref 10–35)
Albumin: 4 g/dL (ref 3.6–5.1)
Alkaline phosphatase (APISO): 48 U/L (ref 37–153)
BUN: 19 mg/dL (ref 7–25)
CO2: 31 mmol/L (ref 20–32)
Calcium: 9.4 mg/dL (ref 8.6–10.4)
Chloride: 104 mmol/L (ref 98–110)
Creat: 0.8 mg/dL (ref 0.60–0.88)
GFR, Est African American: 80 mL/min/{1.73_m2} (ref >=60)
GFR, Est Non African American: 69 mL/min/{1.73_m2} (ref >=60)
Globulin: 2.2 g/dL (calc) (ref 1.9–3.7)
Glucose, Bld: 108 mg/dL — ABNORMAL HIGH (ref 65–99)
Potassium: 4.9 mmol/L (ref 3.5–5.3)
Sodium: 140 mmol/L (ref 135–146)
Total Bilirubin: 0.4 mg/dL (ref 0.2–1.2)
Total Protein: 6.2 g/dL (ref 6.1–8.1)

## 2019-11-10 LAB — LIPID PANEL
Cholesterol: 162 mg/dL (ref <200)
HDL: 54 mg/dL (ref >=50)
LDL Cholesterol (Calc): 84 mg/dL (calc)
Non-HDL Cholesterol (Calc): 108 mg/dL (calc) (ref <130)
Total CHOL/HDL Ratio: 3 (calc) (ref <5.0)
Triglycerides: 147 mg/dL (ref <150)

## 2019-11-24 DIAGNOSIS — M7981 Nontraumatic hematoma of soft tissue: Secondary | ICD-10-CM | POA: Diagnosis not present

## 2019-12-16 ENCOUNTER — Other Ambulatory Visit: Payer: Self-pay | Admitting: Internal Medicine

## 2019-12-16 DIAGNOSIS — M1000 Idiopathic gout, unspecified site: Secondary | ICD-10-CM

## 2020-02-05 ENCOUNTER — Encounter: Payer: Medicare Other | Admitting: Internal Medicine

## 2020-02-10 ENCOUNTER — Encounter: Payer: Self-pay | Admitting: Internal Medicine

## 2020-02-10 NOTE — Progress Notes (Signed)
Annual Screening/Preventative Visit & Comprehensive Evaluation &  Examination     This very nice 85 y.o.  WWF presents for a Screening /Preventative Visit & comprehensive evaluation and management of multiple medical co-morbidities.  Patient has been followed for HTN, HLD, T2_NIDDM  and Vitamin D Deficiency.      HTN predates since 66. Patient's BP has been controlled at home and patient denies any cardiac symptoms as chest pain, palpitations, shortness of breath, dizziness or ankle swelling. Today's BP is at goal -  132/60      Patient's hyperlipidemia is controlled with diet and Pravastatin. Patient denies myalgias or other medication SE's. Last lipids were at goal:  Lab Results  Component Value Date   CHOL 162 11/10/2019   HDL 54 11/10/2019   LDLCALC 84 11/10/2019   TRIG 147 11/10/2019   CHOLHDL 3.0 11/10/2019       Patient has hx/o T2_NIDDM (2007) which she attempts to control with diet     and patient denies reactive hypoglycemic symptoms, visual blurring, diabetic polys or paresthesias. Last A1c was not at goal::  Lab Results  Component Value Date   HGBA1C 6.5 (H) 08/11/2019       Finally, patient has history of Vitamin D Deficiency and last Vitamin D was very low (goal is 70-100):  Lab Results  Component Value Date   VD25OH 33 08/11/2019    Current Outpatient Medications on File Prior to Visit  Medication Sig  . allopurinol (ZYLOPRIM) 300 MG tablet TAKE 1 TABLET BY MOUTH DAILY TO PREVENT GOUT  . aspirin EC 81 MG tablet Take 81 mg by mouth daily.  Marland Kitchen atenolol (TENORMIN) 50 MG tablet Take 1 tablet Daily for BP  . cholecalciferol (VITAMIN D) 1000 UNITS tablet Take 5,000 Units by mouth every other day.   . citalopram (CELEXA) 20 MG tablet Take 1 tablet Daily for Mood  . diltiazem (CARDIZEM CD) 240 MG 24 hr capsule Take 1 capsule Daily for BP  . fish oil-omega-3 fatty acids 1000 MG capsule Take 1 g by mouth daily.  Marland Kitchen FREESTYLE LITE test strip   . furosemide (LASIX) 40  MG tablet TAKE 1/2 TO 1 TABLET BY MOUTH ONCE DAILY FOR FLUID RETENTION  . Lancets (FREESTYLE) lancets   . lisinopril (ZESTRIL) 20 MG tablet Take 1 tablet Daily for BP  . pravastatin (PRAVACHOL) 40 MG tablet Take 1 tablet at Bedtime for Cholesterol  . triamcinolone cream (KENALOG) 0.1 % as needed.    No current facility-administered medications on file prior to visit.   Allergies  Allergen Reactions  . Buspar [Buspirone]     dysphoria  . Lipitor [Atorvastatin]     Myalgias  . Metformin And Related     Gas/bloating  . Nsaids     GI upset  . Sulfa Antibiotics Other (See Comments)    Reaction=throat itching  . Sulfonamide Derivatives    Past Medical History:  Diagnosis Date  . A-fib ([place])   . Anxiety   . Arthritis   . Diabetes mellitus   . Gout   . Hyperlipemia   . Hypertension    Health Maintenance  Topic Date Due  . OPHTHALMOLOGY EXAM  11/13/2018  . HEMOGLOBIN A1C  02/08/2020  . INFLUENZA VACCINE  02/28/2020  . MAMMOGRAM  03/16/2020  . FOOT EXAM  02/09/2021  . TETANUS/TDAP  11/13/2027  . DEXA SCAN  Completed  . COVID-19 Vaccine  Completed  . PNA vac Low Risk Adult  Discontinued   Immunization History  Administered Date(s) Administered  . Influenza Split 07/13/2014  . Influenza, High Dose Seasonal PF 04/14/2015, 04/16/2017, 06/13/2018, 04/08/2019  . PFIZER SARS-COV-2 Vaccination 08/21/2019, 09/11/2019  . Pneumococcal-Unspecified 06/07/2003  . Td 06/02/2007, 11/12/2017  . Zoster 05/30/2012    Last Colon -  (+) Positive Cologard  - 11/29/2017  followed by (-) Negative Colonoscopy - 03/10/2018 - Dr Paulita Fujita and No f/u recommended due to age  Last MGM - 03/17/2019  Last dexaBMD - 03/17/2019 - Osteopenia improved  Past Surgical History:  Procedure Laterality Date  . ABDOMINAL HYSTERECTOMY    . BIOPSY BREAST     (L) BREAST IN 1993  . KNEE ARTHROSCOPY     Family History  Problem Relation Age of Onset  . Diabetes Mother   . Hypertension Father   . CVA Father     Social History   Tobacco Use  . Smoking status: Former Smoker    Quit date: 07/30/2006    Years since quitting: 13.5  . Smokeless tobacco: Never Used  Substance Use Topics  . Alcohol use: Yes    Alcohol/week: 2.0 standard drinks    Types: 2 Standard drinks or equivalent per week  . Drug use: Not on file    ROS Constitutional: Denies fever, chills, weight loss/gain, headaches, insomnia,  night sweats, and change in appetite. Does c/o fatigue. Eyes: Denies redness, blurred vision, diplopia, discharge, itchy, watery eyes.  ENT: Denies discharge, congestion, post nasal drip, epistaxis, sore throat, earache, hearing loss, dental pain, Tinnitus, Vertigo, Sinus pain, snoring.  Cardio: Denies chest pain, palpitations, irregular heartbeat, syncope, dyspnea, diaphoresis, orthopnea, PND, claudication, edema Respiratory: denies cough, dyspnea, DOE, pleurisy, hoarseness, laryngitis, wheezing.  Gastrointestinal: Denies dysphagia, heartburn, reflux, water brash, pain, cramps, nausea, vomiting, bloating, diarrhea, constipation, hematemesis, melena, hematochezia, jaundice, hemorrhoids Genitourinary: Denies dysuria, frequency, urgency, nocturia, hesitancy, discharge, hematuria, flank pain Breast: Breast lumps, nipple discharge, bleeding.  Musculoskeletal: Denies arthralgia, myalgia, stiffness, Jt. Swelling, pain, limp, and strain/sprain. Denies falls. Skin: Denies puritis, rash, hives, warts, acne, eczema, changing in skin lesion Neuro: No weakness, tremor, incoordination, spasms, paresthesia, pain Psychiatric: Denies confusion, memory loss, sensory loss. Denies Depression. Endocrine: Denies change in weight, skin, hair change, nocturia, and paresthesia, diabetic polys, visual blurring, hyper / hypo glycemic episodes.  Heme/Lymph: No excessive bleeding, bruising, enlarged lymph nodes.  Physical Exam  BP 132/60   Pulse (!) 56   Temp (!) 97.2 F (36.2 C)   Resp 16   Ht 5' 4.5" (1.638 m)   Wt 168  lb 3.2 oz (76.3 kg)   BMI 28.43 kg/m   General Appearance: Well nourished, well groomed and in no apparent distress.  Eyes: PERRLA, EOMs, conjunctiva no swelling or erythema, normal fundi and vessels. Sinuses: No frontal/maxillary tenderness ENT/Mouth: EACs patent / TMs  nl. Nares clear without erythema, swelling, mucoid exudates. Oral hygiene is good. No erythema, swelling, or exudate. Tongue normal, non-obstructing. Tonsils not swollen or erythematous. Hearing normal.  Neck: Supple, thyroid not palpable. No bruits, nodes or JVD. Respiratory: Respiratory effort normal.  BS equal and clear bilateral without rales, rhonci, wheezing or stridor. Cardio: Heart sounds are normal with regular rate and rhythm and no murmurs, rubs or gallops. Peripheral pulses are normal and equal bilaterally without edema. No aortic or femoral bruits. Chest: symmetric with normal excursions and percussion. Breasts: Symmetric, without lumps, nipple discharge, retractions, or fibrocystic changes.  Abdomen: Flat, soft with bowel sounds active. Nontender, no guarding, rebound, hernias, masses, or organomegaly.  Lymphatics: Non tender without lymphadenopathy.  Musculoskeletal:  Full ROM all peripheral extremities, joint stability, 5/5 strength, and normal gait. Skin: Warm and dry without rashes, lesions, cyanosis, clubbing or  ecchymosis.  Neuro: Cranial nerves intact, reflexes equal bilaterally. Normal muscle tone, no cerebellar symptoms. Sensation intact.  Pysch: Alert and oriented X 3, normal affect, Insight and Judgment appropriate.   Assessment and Plan  1. Essential hypertension  - EKG 12-Lead - Urinalysis, Routine w reflex microscopic - Microalbumin / creatinine urine ratio - CBC with Differential/Platelet - COMPLETE METABOLIC PANEL WITH GFR - Magnesium - TSH  2. Hyperlipidemia associated with type 2 diabetes mellitus ([place])  - EKG 12-Lead - Lipid panel - TSH  3. Diabetes mellitus due to underlying  condition with stage 2 chronic  kidney disease, without long-term current use of insulin (HCC)  - EKG 12-Lead - HM DIABETES FOOT EXAM - LOW EXTREMITY NEUR EXAM DOCUM - Insulin, random - Hemoglobin A1c  4. Vitamin D deficiency  - VITAMIN D 25 Hydroxy   5. Idiopathic gout  - Uric acid  6. Gastroesophageal reflux disease    7. Screening for ischemic heart disease  - EKG 12-Lead  8. Family history of cardiovascular disease  - EKG 12-Lead  9. Former smoker  - EKG 12-Lead  10. Medication management  - Urinalysis, Routine w reflex microscopic - Microalbumin / creatinine urine ratio - Uric acid - CBC with Differential/Platelet - COMPLETE METABOLIC PANEL WITH GFR - Magnesium - Lipid panel - TSH - Insulin, random - VITAMIN D 25 Hydroxy          Patient was counseled in prudent diet to achieve/maintain BMI less than 25 for weight control, BP monitoring, regular exercise and medications. Discussed med's effects and SE's. Screening labs and tests as requested with regular follow-up as recommended. Over 40 minutes of exam, counseling, chart review and high complex critical decision making was performed.   Kirtland Bouchard, MD

## 2020-02-10 NOTE — Patient Instructions (Signed)
Due to recent changes in healthcare laws, you may see the results of your imaging and laboratory studies on MyChart before your provider has had a chance to review them.  We understand that in some cases there may be results that are confusing or concerning to you. Not all laboratory results come back in the same time frame and the provider may be waiting for multiple results in order to interpret others.  Please give [place] 48 hours in order for your provider to thoroughly review all the results before contacting the office for clarification of your results.   +++++++++++++++++++++++++  Vit D  & Vit C 1,000 mg   are recommended to help protect  against the Covid-19 and other Corona viruses.    Also it's recommended  to take  Zinc 50 mg  to help  protect against the Covid-19   and best place to get  is also on [email or web address]  and don't pay more than 6-8 cents /pill !  ================================ Coronavirus (COVID-19) Are you at risk?  Are you at risk for the Coronavirus (COVID-19)?  To be considered HIGH RISK for Coronavirus (COVID-19), you have to meet the following criteria:  . Traveled to [place], [place], [place], [place] or [place]; or in the [place] to [place], [place], [place]  . or [place]; and have fever, cough, and shortness of breath within the last 2 weeks of travel OR . Been in close contact with a person diagnosed with COVID-19 within the last 2 weeks and have  . fever, cough,and shortness of breath .  . IF YOU DO NOT MEET THESE CRITERIA, YOU ARE CONSIDERED LOW RISK FOR COVID-19.  What to do if you are HIGH RISK for COVID-19?  Marland Kitchen If you are having a medical emergency, call 911. . Seek medical care right away. Before you go to a doctor's office, urgent care or emergency department, .  call ahead and tell them about your recent travel, contact with someone diagnosed with COVID-19  .  and your symptoms.  . You should receive instructions from your physician's  office regarding next steps of care.  . When you arrive at healthcare provider, tell the healthcare staff immediately you have returned from  . visiting [place], [place], [place], [place] or [place]; or traveled in the [place] to [place], [place],  . [place] or [place] in the last two weeks or you have been in close contact with a person diagnosed with  . COVID-19 in the last 2 weeks.   . Tell the health care staff about your symptoms: fever, cough and shortness of breath. . After you have been seen by a medical provider, you will be either: o Tested for (COVID-19) and discharged home on quarantine except to seek medical care if  o symptoms worsen, and asked to  - Stay home and avoid contact with others until you get your results (4-5 days)  - Avoid travel on public transportation if possible (such as bus, train, or airplane) or o Sent to the Emergency Department by EMS for evaluation, COVID-19 testing  and  o possible admission depending on your condition and test results.  What to do if you are LOW RISK for COVID-19?  Reduce your risk of any infection by using the same precautions used for avoiding the common cold or flu:  Marland Kitchen Wash your hands often with soap and warm water for at least 20 seconds.  If soap and water are not readily  available,  . use an alcohol-based hand sanitizer with at least 60% alcohol.  . If coughing or sneezing, cover your mouth and nose by coughing or sneezing into the elbow areas of your shirt or coat, .  into a tissue or into your sleeve (not your hands). . Avoid shaking hands with others and consider head nods or verbal greetings only. . Avoid touching your eyes, nose, or mouth with unwashed hands.  . Avoid close contact with people who are sick. . Avoid places or events with large numbers of people in one location, like concerts or sporting events. . Carefully consider travel plans you have or are making. . If you are planning any travel outside or  inside the [place], visit the CDC's Travelers' Health webpage for the latest health notices. . If you have some symptoms but not all symptoms, continue to monitor at home and seek medical attention  . if your symptoms worsen. . If you are having a medical emergency, call 911.   . >>>>>>>>>>>>>>>>>>>>>>>>>>>>>>>>> . We Do NOT Approve of  [facility], [employer]  Our Patients  To Do Home Visits & We Do NOT Approve of [employer] > > > > > > > > > > > > > > > > > > > > > > > > > > > > > > > > > > > > > > >  Preventive Care for Adults  A healthy lifestyle and preventive care can promote health and wellness. Preventive health guidelines for women include the following key practices.  A routine yearly physical is a good way to check with your health care provider about your health and preventive screening. It is a chance to share any concerns and updates on your health and to receive a thorough exam.  Visit your dentist for a routine exam and preventive care every 6 months. Brush your teeth twice a day and floss once a day. Good oral hygiene prevents tooth decay and gum disease.  The frequency of eye exams is based on your age, health, family medical history, use of contact lenses, and other factors. Follow your health care provider's recommendations for frequency of eye exams.  Eat a healthy diet. Foods like vegetables, fruits, whole grains, low-fat dairy products, and lean protein foods contain the nutrients you need without too many calories. Decrease your intake of foods high in solid fats, added sugars, and salt. Eat the right amount of calories for you. Get information about a proper diet from your health care provider, if necessary.  Regular physical exercise is one of the most important things you can do for your health. Most adults should get at least 150 minutes of moderate-intensity exercise (any activity that increases your heart rate and causes you to sweat) each  week. In addition, most adults need muscle-strengthening exercises on 2 or more days a week.  Maintain a healthy weight. The body mass index (BMI) is a screening tool to identify possible weight problems. It provides an estimate of body fat based on height and weight. Your health care provider can find your BMI and can help you achieve or maintain a healthy weight. For adults 20 years and older:  A BMI below 18.5 is considered underweight.  A BMI of 18.5 to 24.9 is normal.  A BMI of 25 to 29.9 is considered overweight.  A BMI of 30 and above is considered obese.  Maintain normal blood lipids and cholesterol levels by exercising and minimizing your intake of  saturated fat. Eat a balanced diet with plenty of fruit and vegetables. If your lipid or cholesterol levels are high, you are over 50, or you are at high risk for heart disease, you may need your cholesterol levels checked more frequently. Ongoing high lipid and cholesterol levels should be treated with medicines if diet and exercise are not working.  If you smoke, find out from your health care provider how to quit. If you do not use tobacco, do not start.  Lung cancer screening is recommended for adults aged 28-80 years who are at high risk for developing lung cancer because of a history of smoking. A yearly low-dose CT scan of the lungs is recommended for people who have at least a 30-pack-year history of smoking and are a current smoker or have quit within the past 15 years. A pack year of smoking is smoking an average of 1 pack of cigarettes a day for 1 year (for example: 1 pack a day for 30 years or 2 packs a day for 15 years). Yearly screening should continue until the smoker has stopped smoking for at least 15 years. Yearly screening should be stopped for people who develop a health problem that would prevent them from having lung cancer treatment.  Avoid use of street drugs. Do not share needles with anyone. Ask for help if you need  support or instructions about stopping the use of drugs.  High blood pressure causes heart disease and increases the risk of stroke.  Ongoing high blood pressure should be treated with medicines if weight loss and exercise do not work.  If you are 84-20 years old, ask your health care provider if you should take aspirin to prevent strokes.  Diabetes screening involves taking a blood sample to check your fasting blood sugar level. This should be done once every 3 years, after age 54, if you are within normal weight and without risk factors for diabetes. Testing should be considered at a younger age or be carried out more frequently if you are overweight and have at least 1 risk factor for diabetes.  Breast cancer screening is essential preventive care for women. You should practice "breast self-awareness." This means understanding the normal appearance and feel of your breasts and may include breast self-examination. Any changes detected, no matter how small, should be reported to a health care provider. Women in their 76s and 30s should have a clinical breast exam (CBE) by a health care provider as part of a regular health exam every 1 to 3 years. After age 57, women should have a CBE every year. Starting at age 37, women should consider having a mammogram (breast X-ray test) every year. Women who have a family history of breast cancer should talk to their health care provider about genetic screening. Women at a high risk of breast cancer should talk to their health care providers about having an MRI and a mammogram every year.  Breast cancer gene (BRCA)-related cancer risk assessment is recommended for women who have family members with BRCA-related cancers. BRCA-related cancers include breast, ovarian, tubal, and peritoneal cancers. Having family members with these cancers may be associated with an increased risk for harmful changes (mutations) in the breast cancer genes BRCA1 and BRCA2. Results of the  assessment will determine the need for genetic counseling and BRCA1 and BRCA2 testing.  Routine pelvic exams to screen for cancer are no longer recommended for nonpregnant women who are considered low risk for cancer of the pelvic organs (ovaries,  uterus, and vagina) and who do not have symptoms. Ask your health care provider if a screening pelvic exam is right for you.  If you have had past treatment for cervical cancer or a condition that could lead to cancer, you need Pap tests and screening for cancer for at least 20 years after your treatment. If Pap tests have been discontinued, your risk factors (such as having a new sexual partner) need to be reassessed to determine if screening should be resumed. Some women have medical problems that increase the chance of getting cervical cancer. In these cases, your health care provider may recommend more frequent screening and Pap tests.    Colorectal cancer can be detected and often prevented. Most routine colorectal cancer screening begins at the age of 62 years and continues through age 28 years. However, your health care provider may recommend screening at an earlier age if you have risk factors for colon cancer. On a yearly basis, your health care provider may provide home test kits to check for hidden blood in the stool. Use of a small camera at the end of a tube, to directly examine the colon (sigmoidoscopy or colonoscopy), can detect the earliest forms of colorectal cancer. Talk to your health care provider about this at age 23, when routine screening begins.  Direct exam of the colon should be repeated every 5-10 years through age 31 years, unless early forms of pre-cancerous polyps or small growths are found.  Osteoporosis is a disease in which the bones lose minerals and strength with aging. This can result in serious bone fractures or breaks. The risk of osteoporosis can be identified using a bone density scan. Women ages 83 years and over and women  at risk for fractures or osteoporosis should discuss screening with their health care providers. Ask your health care provider whether you should take a calcium supplement or vitamin D to reduce the rate of osteoporosis.  Menopause can be associated with physical symptoms and risks. Hormone replacement therapy is available to decrease symptoms and risks. You should talk to your health care provider about whether hormone replacement therapy is right for you.  Use sunscreen. Apply sunscreen liberally and repeatedly throughout the day. You should seek shade when your shadow is shorter than you. Protect yourself by wearing long sleeves, pants, a wide-brimmed hat, and sunglasses year round, whenever you are outdoors.  Once a month, do a whole body skin exam, using a mirror to look at the skin on your back. Tell your health care provider of new moles, moles that have irregular borders, moles that are larger than a pencil eraser, or moles that have changed in shape or color.  Stay current with required vaccines (immunizations).  Influenza vaccine. All adults should be immunized every year.  Tetanus, diphtheria, and acellular pertussis (Td, Tdap) vaccine. Pregnant women should receive 1 dose of Tdap vaccine during each pregnancy. The dose should be obtained regardless of the length of time since the last dose. Immunization is preferred during the 27th-36th week of gestation. An adult who has not previously received Tdap or who does not know her vaccine status should receive 1 dose of Tdap. This initial dose should be followed by tetanus and diphtheria toxoids (Td) booster doses every 10 years. Adults with an unknown or incomplete history of completing a 3-dose immunization series with Td-containing vaccines should begin or complete a primary immunization series including a Tdap dose. Adults should receive a Td booster every 10 years.  Zoster vaccine. One dose is recommended for adults aged 64 years or  older unless certain conditions are present.    Pneumococcal 13-valent conjugate (PCV13) vaccine. When indicated, a person who is uncertain of her immunization history and has no record of immunization should receive the PCV13 vaccine. An adult aged 61 years or older who has certain medical conditions and has not been previously immunized should receive 1 dose of PCV13 vaccine. This PCV13 should be followed with a dose of pneumococcal polysaccharide (PPSV23) vaccine. The PPSV23 vaccine dose should be obtained at least 1 or more year(s) after the dose of PCV13 vaccine. An adult aged 46 years or older who has certain medical conditions and previously received 1 or more doses of PPSV23 vaccine should receive 1 dose of PCV13. The PCV13 vaccine dose should be obtained 1 or more years after the last PPSV23 vaccine dose.    Pneumococcal polysaccharide (PPSV23) vaccine. When PCV13 is also indicated, PCV13 should be obtained first. All adults aged 58 years and older should be immunized. An adult younger than age 1 years who has certain medical conditions should be immunized. Any person who resides in a nursing home or long-term care facility should be immunized. An adult smoker should be immunized. People with an immunocompromised condition and certain other conditions should receive both PCV13 and PPSV23 vaccines. People with human immunodeficiency virus (HIV) infection should be immunized as soon as possible after diagnosis. Immunization during chemotherapy or radiation therapy should be avoided. Routine use of PPSV23 vaccine is not recommended for American Indians, [place] Natives, or people younger than 65 years unless there are medical conditions that require PPSV23 vaccine. When indicated, people who have unknown immunization and have no record of immunization should receive PPSV23 vaccine. One-time revaccination 5 years after the first dose of PPSV23 is recommended for people aged 19-64 years who have chronic  kidney failure, nephrotic syndrome, asplenia, or immunocompromised conditions. People who received 1-2 doses of PPSV23 before age 11 years should receive another dose of PPSV23 vaccine at age 70 years or later if at least 5 years have passed since the previous dose. Doses of PPSV23 are not needed for people immunized with PPSV23 at or after age 65 years.   Preventive Services / Frequency  Ages 56 years and over  Blood pressure check.  Lipid and cholesterol check.  Lung cancer screening. / Every year if you are aged 16-80 years and have a 30-pack-year history of smoking and currently smoke or have quit within the past 15 years. Yearly screening is stopped once you have quit smoking for at least 15 years or develop a health problem that would prevent you from having lung cancer treatment.  Clinical breast exam.** / Every year after age 59 years.   BRCA-related cancer risk assessment.** / For women who have family members with a BRCA-related cancer (breast, ovarian, tubal, or peritoneal cancers).  Mammogram.** / Every year beginning at age 70 years and continuing for as long as you are in good health. Consult with your health care provider.  Pap test.** / Every 3 years starting at age 32 years through age 28 or 9 years with 3 consecutive normal Pap tests. Testing can be stopped between 65 and 70 years with 3 consecutive normal Pap tests and no abnormal Pap or HPV tests in the past 10 years.  Fecal occult blood test (FOBT) of stool. / Every year beginning at age 57 years and continuing until age 29 years. You may not need to  do this test if you get a colonoscopy every 10 years.  Flexible sigmoidoscopy or colonoscopy.** / Every 5 years for a flexible sigmoidoscopy or every 10 years for a colonoscopy beginning at age 21 years and continuing until age 56 years.  Hepatitis C blood test.** / For all people born from [birth year] through 1963 and any individual with known risks for hepatitis  C.  Osteoporosis screening.** / A one-time screening for women ages 53 years and over and women at risk for fractures or osteoporosis.  Skin self-exam. / Monthly.  Influenza vaccine. / Every year.  Tetanus, diphtheria, and acellular pertussis (Tdap/Td) vaccine.** / 1 dose of Td every 10 years.  Zoster vaccine.** / 1 dose for adults aged 32 years or older.  Pneumococcal 13-valent conjugate (PCV13) vaccine.** / Consult your health care provider.  Pneumococcal polysaccharide (PPSV23) vaccine.** / 1 dose for all adults aged 67 years and older. Screening for abdominal aortic aneurysm (AAA)  by ultrasound is recommended for people who have history of high blood pressure or who are current or former smokers. ++++++++++++++++++++ Recommend Adult Low Dose Aspirin or  coated  Aspirin 81 mg daily  To reduce risk of Colon Cancer 40 %,  Skin Cancer 26 % ,  Melanoma 46%  and  Pancreatic cancer 60% ++++++++++++++++++++ Vitamin D goal  is between 70-100.  Please make sure that you are taking your Vitamin D as directed.  It is very important as a natural anti-inflammatory  helping hair, skin, and nails, as well as reducing stroke and heart attack risk.  It helps your bones and helps with mood. It also decreases numerous cancer risks so please take it as directed.  Low Vit D is associated with a 200-300% higher risk for CANCER  and 200-300% higher risk for HEART   ATTACK  &  STROKE.   .....................................Marland Kitchen It is also associated with higher death rate at younger ages,  autoimmune diseases like Rheumatoid arthritis, Lupus, Multiple Sclerosis.    Also many other serious conditions, like depression, Alzheimer's Dementia, infertility, muscle aches, fatigue, fibromyalgia - just to name a few. ++++++++++++++++++ Recommend the book "The END of DIETING" by Dr Excell Seltzer  & the book "The END of DIABETES " by Dr Excell Seltzer At [email or web address] - get book & Audio CD's    Being diabetic has  a  300% increased risk for heart attack, stroke, cancer, and alzheimer- type vascular dementia. It is very important that you work harder with diet by avoiding all foods that are white. Avoid white rice (brown & wild rice is OK), white potatoes (sweetpotatoes in moderation is OK), White bread or wheat bread or anything made out of white flour like bagels, donuts, rolls, buns, biscuits, cakes, pastries, cookies, pizza crust, and pasta (made from white flour & egg whites) - vegetarian pasta or spinach or wheat pasta is OK. Multigrain breads like Arnold's or [employer], or multigrain sandwich thins or flatbreads.  Diet, exercise and weight loss can reverse and cure diabetes in the early stages.  Diet, exercise and weight loss is very important in the control and prevention of complications of diabetes which affects every system in your body, ie. Brain - dementia/stroke, eyes - glaucoma/blindness, heart - heart attack/heart failure, kidneys - dialysis, stomach - gastric paralysis, intestines - malabsorption, nerves - severe painful neuritis, circulation - gangrene & loss of a leg(s), and finally cancer and Alzheimers.    I recommend avoid fried & greasy foods,  sweets/candy, white rice (brown or wild  rice or Quinoa is OK), white potatoes (sweet potatoes are OK) - anything made from white flour - bagels, doughnuts, rolls, buns, biscuits,white and wheat breads, pizza crust and traditional pasta made of white flour & egg white(vegetarian pasta or spinach or wheat pasta is OK).  Multi-grain bread is OK - like multi-grain flat bread or sandwich thins. Avoid alcohol in excess. Exercise is also important.    Eat all the vegetables you want - avoid meat, especially red meat and dairy - especially cheese.  Cheese is the most concentrated form of trans-fats which is the worst thing to clog up our arteries. Veggie cheese is OK which can be found in the fresh produce section at [employer] or [employer] or  [employer]  +++++++++++++++++++ DASH Eating Plan  DASH stands for "Dietary Approaches to Stop Hypertension."   The DASH eating plan is a healthy eating plan that has been shown to reduce high blood pressure (hypertension). Additional health benefits may include reducing the risk of type 2 diabetes mellitus, heart disease, and stroke. The DASH eating plan may also help with weight loss. WHAT DO I NEED TO KNOW ABOUT THE DASH EATING PLAN? For the DASH eating plan, you will follow these general guidelines:  Choose foods with a percent daily value for sodium of less than 5% (as listed on the food label).  Use salt-free seasonings or herbs instead of table salt or sea salt.  Check with your health care provider or pharmacist before using salt substitutes.  Eat lower-sodium products, often labeled as "lower sodium" or "no salt added."  Eat fresh foods.  Eat more vegetables, fruits, and low-fat dairy products.  Choose whole grains. Look for the word "whole" as the first word in the ingredient list.  Choose fish   Limit sweets, desserts, sugars, and sugary drinks.  Choose heart-healthy fats.  Eat veggie cheese   Eat more home-cooked food and less restaurant, buffet, and fast food.  Limit fried foods.  [place] foods using methods other than frying.  Limit canned vegetables. If you do use them, rinse them well to decrease the sodium.  When eating at a restaurant, ask that your food be prepared with less salt, or no salt if possible.                      WHAT FOODS CAN I EAT? Read Dr Fara Olden Fuhrman's books on The End of Dieting & The End of Diabetes  Grains Whole grain or whole wheat bread. Brown rice. Whole grain or whole wheat pasta. Quinoa, bulgur, and whole grain cereals. Low-sodium cereals. Corn or whole wheat flour tortillas. Whole grain cornbread. Whole grain crackers. Low-sodium crackers.  Vegetables Fresh or frozen vegetables (raw, steamed, roasted, or grilled). Low-sodium or  reduced-sodium tomato and vegetable juices. Low-sodium or reduced-sodium tomato sauce and paste. Low-sodium or reduced-sodium canned vegetables.   Fruits All fresh, canned (in natural juice), or frozen fruits.  Protein Products  All fish and seafood.  Dried beans, peas, or lentils. Unsalted nuts and seeds. Unsalted canned beans.  Dairy Low-fat dairy products, such as skim or 1% milk, 2% or reduced-fat cheeses, low-fat ricotta or cottage cheese, or plain low-fat yogurt. Low-sodium or reduced-sodium cheeses.  Fats and Oils Tub margarines without trans fats. Light or reduced-fat mayonnaise and salad dressings (reduced sodium). Avocado. Safflower, olive, or canola oils. Natural peanut or almond butter.  Other Unsalted popcorn and pretzels. The items listed above may not be a complete list of recommended foods  or beverages. Contact your dietitian for more options.  +++++++++++++++  WHAT FOODS ARE NOT RECOMMENDED? Grains/ White flour or wheat flour White bread. White pasta. White rice. Refined cornbread. Bagels and croissants. Crackers that contain trans fat.  Vegetables  Creamed or fried vegetables. Vegetables in a . Regular canned vegetables. Regular canned tomato sauce and paste. Regular tomato and vegetable juices.  Fruits Dried fruits. Canned fruit in light or heavy syrup. Fruit juice.  Meat and Other Protein Products Meat in general - RED meat & White meat.  Fatty cuts of meat. Ribs, chicken wings, all processed meats as bacon, sausage, bologna, salami, fatback, hot dogs, bratwurst and packaged luncheon meats.  Dairy Whole or 2% milk, cream, half-and-half, and cream cheese. Whole-fat or sweetened yogurt. Full-fat cheeses or blue cheese. Non-dairy creamers and whipped toppings. Processed cheese, cheese spreads, or cheese curds.  Condiments Onion and garlic salt, seasoned salt, table salt, and sea salt. Canned and packaged gravies. Worcestershire sauce. Tartar sauce. Barbecue  sauce. Teriyaki sauce. Soy sauce, including reduced sodium. Steak sauce. Fish sauce. Oyster sauce. Cocktail sauce. Horseradish. Ketchup and mustard. Meat flavorings and tenderizers. Bouillon cubes. Hot sauce. Tabasco sauce. Marinades. Taco seasonings. Relishes.  Fats and Oils Butter, stick margarine, lard, shortening and bacon fat. Coconut, palm kernel, or palm oils. Regular salad dressings.  Pickles and olives. Salted popcorn and pretzels.  The items listed above may not be a complete list of foods and beverages to avoid.

## 2020-02-11 ENCOUNTER — Ambulatory Visit (INDEPENDENT_AMBULATORY_CARE_PROVIDER_SITE_OTHER): Payer: Medicare Other | Admitting: Internal Medicine

## 2020-02-11 ENCOUNTER — Other Ambulatory Visit: Payer: Self-pay

## 2020-02-11 VITALS — BP 132/60 | HR 56 | Temp 97.2°F | Resp 16 | Ht 64.5 in | Wt 168.2 lb

## 2020-02-11 DIAGNOSIS — E785 Hyperlipidemia, unspecified: Secondary | ICD-10-CM | POA: Diagnosis not present

## 2020-02-11 DIAGNOSIS — M1000 Idiopathic gout, unspecified site: Secondary | ICD-10-CM

## 2020-02-11 DIAGNOSIS — E0822 Diabetes mellitus due to underlying condition with diabetic chronic kidney disease: Secondary | ICD-10-CM

## 2020-02-11 DIAGNOSIS — Z87891 Personal history of nicotine dependence: Secondary | ICD-10-CM | POA: Diagnosis not present

## 2020-02-11 DIAGNOSIS — I10 Essential (primary) hypertension: Secondary | ICD-10-CM

## 2020-02-11 DIAGNOSIS — E1169 Type 2 diabetes mellitus with other specified complication: Secondary | ICD-10-CM | POA: Diagnosis not present

## 2020-02-11 DIAGNOSIS — E559 Vitamin D deficiency, unspecified: Secondary | ICD-10-CM | POA: Diagnosis not present

## 2020-02-11 DIAGNOSIS — Z79899 Other long term (current) drug therapy: Secondary | ICD-10-CM | POA: Diagnosis not present

## 2020-02-11 DIAGNOSIS — N182 Chronic kidney disease, stage 2 (mild): Secondary | ICD-10-CM

## 2020-02-11 DIAGNOSIS — Z8249 Family history of ischemic heart disease and other diseases of the circulatory system: Secondary | ICD-10-CM

## 2020-02-11 DIAGNOSIS — K219 Gastro-esophageal reflux disease without esophagitis: Secondary | ICD-10-CM

## 2020-02-11 DIAGNOSIS — Z136 Encounter for screening for cardiovascular disorders: Secondary | ICD-10-CM

## 2020-02-12 LAB — URINALYSIS, ROUTINE W REFLEX MICROSCOPIC
Bilirubin Urine: NEGATIVE
Glucose, UA: NEGATIVE
Hgb urine dipstick: NEGATIVE
Ketones, ur: NEGATIVE
Leukocytes,Ua: NEGATIVE
Nitrite: NEGATIVE
Protein, ur: NEGATIVE
Specific Gravity, Urine: 1.02 (ref 1.001–1.03)
pH: 8 (ref 5.0–8.0)

## 2020-02-12 LAB — MICROALBUMIN / CREATININE URINE RATIO
Creatinine, Urine: 104 mg/dL (ref 20–275)
Microalb Creat Ratio: 2 mcg/mg creat (ref <30)
Microalb, Ur: 0.2 mg/dL

## 2020-02-12 LAB — COMPLETE METABOLIC PANEL WITH GFR
AG Ratio: 1.6 (calc) (ref 1.0–2.5)
ALT: 12 U/L (ref 6–29)
AST: 16 U/L (ref 10–35)
Albumin: 4 g/dL (ref 3.6–5.1)
Alkaline phosphatase (APISO): 48 U/L (ref 37–153)
BUN/Creatinine Ratio: 21 (calc) (ref 6–22)
BUN: 19 mg/dL (ref 7–25)
CO2: 28 mmol/L (ref 20–32)
Calcium: 9.2 mg/dL (ref 8.6–10.4)
Chloride: 105 mmol/L (ref 98–110)
Creat: 0.89 mg/dL — ABNORMAL HIGH (ref 0.60–0.88)
GFR, Est African American: 70 mL/min/{1.73_m2} (ref >=60)
GFR, Est Non African American: 60 mL/min/{1.73_m2} (ref >=60)
Globulin: 2.5 g/dL (calc) (ref 1.9–3.7)
Glucose, Bld: 109 mg/dL — ABNORMAL HIGH (ref 65–99)
Potassium: 4.7 mmol/L (ref 3.5–5.3)
Sodium: 141 mmol/L (ref 135–146)
Total Bilirubin: 0.5 mg/dL (ref 0.2–1.2)
Total Protein: 6.5 g/dL (ref 6.1–8.1)

## 2020-02-12 LAB — LIPID PANEL
Cholesterol: 173 mg/dL (ref <200)
HDL: 58 mg/dL (ref >=50)
LDL Cholesterol (Calc): 94 mg/dL (calc)
Non-HDL Cholesterol (Calc): 115 mg/dL (calc) (ref <130)
Total CHOL/HDL Ratio: 3 (calc) (ref <5.0)
Triglycerides: 117 mg/dL (ref <150)

## 2020-02-12 LAB — HEMOGLOBIN A1C
Hgb A1c MFr Bld: 6.6 % of total Hgb — ABNORMAL HIGH (ref <5.7)
Mean Plasma Glucose: 143 (calc)
eAG (mmol/L): 7.9 (calc)

## 2020-02-12 LAB — URIC ACID: Uric Acid, Serum: 4 mg/dL (ref 2.5–7.0)

## 2020-02-12 LAB — CBC WITH DIFFERENTIAL/PLATELET
Absolute Monocytes: 499 cells/uL (ref 200–950)
Basophils Absolute: 58 cells/uL (ref 0–200)
Basophils Relative: 0.9 %
Eosinophils Absolute: 192 cells/uL (ref 15–500)
Eosinophils Relative: 3 %
HCT: 39.1 % (ref 35.0–45.0)
Hemoglobin: 13.1 g/dL (ref 11.7–15.5)
Lymphs Abs: 2240 cells/uL (ref 850–3900)
MCH: 33 pg (ref 27.0–33.0)
MCHC: 33.5 g/dL (ref 32.0–36.0)
MCV: 98.5 fL (ref 80.0–100.0)
MPV: 11.2 fL (ref 7.5–12.5)
Monocytes Relative: 7.8 %
Neutro Abs: 3411 cells/uL (ref 1500–7800)
Neutrophils Relative %: 53.3 %
Platelets: 178 10*3/uL (ref 140–400)
RBC: 3.97 10*6/uL (ref 3.80–5.10)
RDW: 13.1 % (ref 11.0–15.0)
Total Lymphocyte: 35 %
WBC: 6.4 10*3/uL (ref 3.8–10.8)

## 2020-02-12 LAB — MAGNESIUM: Magnesium: 2.2 mg/dL (ref 1.5–2.5)

## 2020-02-12 LAB — INSULIN, RANDOM: Insulin: 8.6 u[IU]/mL

## 2020-02-12 LAB — VITAMIN D 25 HYDROXY (VIT D DEFICIENCY, FRACTURES): Vit D, 25-Hydroxy: 29 ng/mL — ABNORMAL LOW (ref 30–100)

## 2020-02-12 LAB — TSH: TSH: 1.75 mIU/L (ref 0.40–4.50)

## 2020-02-12 NOTE — Progress Notes (Signed)
=========================================================  -    Uric Acid  / Gout test Normal & OK  =========================================================  -  Total Chol = 173 -  Excellent   - Very low risk for Heart Attack  / Stroke =============================================================  - Vitamin D= 29 - is very very low -   - Vitamin D goal is between 70-100.   - Please make sure that you are taking your Vitamin D 5,000 units EVERY day ! as directed.   - It is very important as a natural anti-inflammatory and helping the  immune system protect against viral infections, like the Covid-19    helping hair, skin, and nails, as well as reducing stroke and heart attack risk.   - It helps your bones and helps with mood.  - It also decreases numerous cancer risks so please take it as directed.   - Low Vit D is associated with a 200-300% higher risk for CANCER   and 200-300% higher risk for HEART   ATTACK  &  STROKE.    - It is also associated with higher death rate at younger ages,   autoimmune diseases like Rheumatoid arthritis, Lupus, Multiple Sclerosis.     - Also many other serious conditions, like depression, Alzheimer's  Dementia, infertility, muscle aches, fatigue, fibromyalgia - just to name a few.  ==========================================================  - A1c = 6.6%  is slightly elevated  - average glucose = 143 mg%   -  Recommend take Cinnamon 1,000 mgh capsules 2 x /day to  lower blood sugar  =========================================================  -  All Else - CBC - Kidneys - Electrolytes - Liver - Magnesium & Thyroid    - all  Normal / OK ==========================================================

## 2020-03-17 ENCOUNTER — Other Ambulatory Visit: Payer: Self-pay | Admitting: Internal Medicine

## 2020-03-17 DIAGNOSIS — I8311 Varicose veins of right lower extremity with inflammation: Secondary | ICD-10-CM | POA: Diagnosis not present

## 2020-03-17 DIAGNOSIS — I83893 Varicose veins of bilateral lower extremities with other complications: Secondary | ICD-10-CM | POA: Diagnosis not present

## 2020-03-17 DIAGNOSIS — I83813 Varicose veins of bilateral lower extremities with pain: Secondary | ICD-10-CM | POA: Diagnosis not present

## 2020-03-17 DIAGNOSIS — I8312 Varicose veins of left lower extremity with inflammation: Secondary | ICD-10-CM | POA: Diagnosis not present

## 2020-03-24 DIAGNOSIS — Z1231 Encounter for screening mammogram for malignant neoplasm of breast: Secondary | ICD-10-CM | POA: Diagnosis not present

## 2020-03-24 LAB — HM MAMMOGRAPHY

## 2020-04-14 DIAGNOSIS — I8311 Varicose veins of right lower extremity with inflammation: Secondary | ICD-10-CM | POA: Diagnosis not present

## 2020-04-14 DIAGNOSIS — I83812 Varicose veins of left lower extremities with pain: Secondary | ICD-10-CM | POA: Diagnosis not present

## 2020-04-14 DIAGNOSIS — I83811 Varicose veins of right lower extremities with pain: Secondary | ICD-10-CM | POA: Diagnosis not present

## 2020-04-14 DIAGNOSIS — I8312 Varicose veins of left lower extremity with inflammation: Secondary | ICD-10-CM | POA: Diagnosis not present

## 2020-04-26 DIAGNOSIS — Z23 Encounter for immunization: Secondary | ICD-10-CM | POA: Diagnosis not present

## 2020-04-26 DIAGNOSIS — E7800 Pure hypercholesterolemia, unspecified: Secondary | ICD-10-CM | POA: Diagnosis not present

## 2020-04-26 DIAGNOSIS — I10 Essential (primary) hypertension: Secondary | ICD-10-CM | POA: Diagnosis not present

## 2020-04-26 DIAGNOSIS — E1165 Type 2 diabetes mellitus with hyperglycemia: Secondary | ICD-10-CM | POA: Diagnosis not present

## 2020-04-27 ENCOUNTER — Encounter: Payer: Self-pay | Admitting: *Deleted

## 2020-05-03 DIAGNOSIS — I8311 Varicose veins of right lower extremity with inflammation: Secondary | ICD-10-CM | POA: Diagnosis not present

## 2020-05-03 DIAGNOSIS — I83813 Varicose veins of bilateral lower extremities with pain: Secondary | ICD-10-CM | POA: Diagnosis not present

## 2020-05-03 DIAGNOSIS — I8312 Varicose veins of left lower extremity with inflammation: Secondary | ICD-10-CM | POA: Diagnosis not present

## 2020-05-09 ENCOUNTER — Ambulatory Visit: Payer: Medicare Other | Admitting: Physician Assistant

## 2020-05-23 ENCOUNTER — Ambulatory Visit: Payer: Medicare Other | Admitting: Adult Health Nurse Practitioner

## 2020-05-23 DIAGNOSIS — I8311 Varicose veins of right lower extremity with inflammation: Secondary | ICD-10-CM | POA: Diagnosis not present

## 2020-05-23 DIAGNOSIS — I83811 Varicose veins of right lower extremities with pain: Secondary | ICD-10-CM | POA: Diagnosis not present

## 2020-05-30 ENCOUNTER — Ambulatory Visit (INDEPENDENT_AMBULATORY_CARE_PROVIDER_SITE_OTHER): Payer: Medicare Other | Admitting: Adult Health Nurse Practitioner

## 2020-05-30 ENCOUNTER — Other Ambulatory Visit: Payer: Self-pay

## 2020-05-30 ENCOUNTER — Encounter: Payer: Self-pay | Admitting: Adult Health Nurse Practitioner

## 2020-05-30 VITALS — BP 110/86 | HR 67 | Temp 97.5°F | Ht 64.5 in | Wt 165.0 lb

## 2020-05-30 DIAGNOSIS — M8589 Other specified disorders of bone density and structure, multiple sites: Secondary | ICD-10-CM

## 2020-05-30 DIAGNOSIS — N182 Chronic kidney disease, stage 2 (mild): Secondary | ICD-10-CM

## 2020-05-30 DIAGNOSIS — E785 Hyperlipidemia, unspecified: Secondary | ICD-10-CM

## 2020-05-30 DIAGNOSIS — E0822 Diabetes mellitus due to underlying condition with diabetic chronic kidney disease: Secondary | ICD-10-CM | POA: Diagnosis not present

## 2020-05-30 DIAGNOSIS — E1169 Type 2 diabetes mellitus with other specified complication: Secondary | ICD-10-CM

## 2020-05-30 DIAGNOSIS — I480 Paroxysmal atrial fibrillation: Secondary | ICD-10-CM

## 2020-05-30 DIAGNOSIS — E559 Vitamin D deficiency, unspecified: Secondary | ICD-10-CM

## 2020-05-30 DIAGNOSIS — E1122 Type 2 diabetes mellitus with diabetic chronic kidney disease: Secondary | ICD-10-CM | POA: Diagnosis not present

## 2020-05-30 DIAGNOSIS — Z79899 Other long term (current) drug therapy: Secondary | ICD-10-CM | POA: Diagnosis not present

## 2020-05-30 DIAGNOSIS — M1000 Idiopathic gout, unspecified site: Secondary | ICD-10-CM | POA: Diagnosis not present

## 2020-05-30 DIAGNOSIS — E663 Overweight: Secondary | ICD-10-CM

## 2020-05-30 DIAGNOSIS — K219 Gastro-esophageal reflux disease without esophagitis: Secondary | ICD-10-CM

## 2020-05-30 DIAGNOSIS — I10 Essential (primary) hypertension: Secondary | ICD-10-CM | POA: Diagnosis not present

## 2020-05-30 DIAGNOSIS — Z0001 Encounter for general adult medical examination with abnormal findings: Secondary | ICD-10-CM

## 2020-05-30 DIAGNOSIS — Z0000 Encounter for general adult medical examination without abnormal findings: Secondary | ICD-10-CM

## 2020-05-30 DIAGNOSIS — R6889 Other general symptoms and signs: Secondary | ICD-10-CM | POA: Diagnosis not present

## 2020-05-30 NOTE — Progress Notes (Signed)
MEDICARE ANNUAL WELLNESS VISIT AND FOLLOW UP  Assessment:   Encounter for Medicare annual wellness exam Yearly  Essential hypertension Continue current medications: atenolol 50mg , cardizem 240mg , furosemide 40mg  daily Monitor blood pressure at home; call if consistently over 130/80 Continue DASH diet.   Reminder to go to the ER if any CP, SOB, nausea, dizziness, severe HA, changes vision/speech, left arm numbness and tingling and jaw pain.  Paroxysmal atrial fibrillation (HCC) Rate controlled and in NSR with cardizem, not on anticoagulation with CHADSVASC of 5, discussed with patient, has not had Afib over 2-3 years, understands risk of stroke and declines at this time, continue ASA, will call office or go to ER if any symptoms, continue to monitor EKG in the office.   Gastroesophageal reflux disease, esophagitis presence not specified Continue PPI/H2 blocker, diet discussed  Type 2 diabetes mellitus with stage 2 chronic kidney disease, without long-term current use of insulin (HCC) Discussed general issues about diabetes pathophysiology and management., Educational material distributed., Suggested low cholesterol diet., Encouraged aerobic exercise., Discussed foot care., Reminded to get yearly retinal exam - report verified/abstracted Just had foot exam at CPE Patient requests we defer A1C to Dr. Chalmers Cater  Stage 2 CKD due to T2DM ([place]) Increase fluids, avoid NSAIDS, monitor sugars, will monitor      -      CMP/GFR  Hyperlipidemia associated with DM ([place]) Continue medications: pravastatin 40mg  nightly Discussed dietary and exercise modifications Low fat diet   Idiopathic gout, unspecified chronicity, unspecified site Gout- recheck Uric acid as needed, Diet discussed, continue medications.  Vitamin D deficiency Continue supplementation Check vitamin D level  Osteopenia Continue Vit D and Ca, weight bearing exercises Repeat dexa 2 years - 2022  BMI 25.0-29.9 Discussed  dietary and exercise modifications   Medication management Continued   Over 40 minutes of face to face interview, exam, counseling, chart review and critical decision making was performed Future Appointments  Date Time Provider [place]  09/02/2020 10:30 AM Unk Pinto, MD [facility] None  02/13/2021 11:00 AM Unk Pinto, MD [facility] None  05/30/2021 11:30 AM Garnet Sierras, NP [facility] None    Plan:   During the course of the visit the patient was educated and counseled about appropriate screening and preventive services including:    Pneumococcal vaccine   Prevnar 13  Influenza vaccine  Td vaccine  Screening electrocardiogram  Bone densitometry screening  Colorectal cancer screening  Diabetes screening  Glaucoma screening  Nutrition counseling   Advanced directives: requested   Subjective:  Ruth Delgado is a 85 y.o. female who presents for Medicare Annual Wellness Visit and 3 month follow up for HTN, HLD, T2DM with CKD.   [facility] for treatments that have improved her varicose veins.  She is walking every day about 49min.   BMI is Body mass index is 27.88 kg/m., she has been working on diet and exercise. Wt Readings from Last 3 Encounters:  05/30/20 165 lb (74.8 kg)  02/11/20 168 lb 3.2 oz (76.3 kg)  11/10/19 166 lb (75.3 kg)   Her blood pressure has been controlled at home, today their BP is BP: 110/86  She does workout. She denies chest pain, shortness of breath, dizziness. She has a history of Afib, rate controlled, low risk for stroke, patient declines anticoagulation.    She is on cholesterol medication (pravastatin 40 mg daily) and denies myalgias. Her cholesterol is at goal. The cholesterol last visit was:   Lab Results  Component Value Date  CHOL 173 02/11/2020   HDL 58 02/11/2020   LDLCALC 94 02/11/2020   TRIG 117 02/11/2020   CHOLHDL 3.0 02/11/2020   She has been working on diet and exercise for Diabetes  with diabetic chronic kidney disease, she is on bASA, she is on ACE/ARB, follows with Dr. Chalmers Cater follows once a year and denies increased appetite, nausea, paresthesia of the feet and polydipsia. She checks glucose religiously and fasting values run 110s-120s. Last A1C was:  Lab Results  Component Value Date   HGBA1C 6.6 (H) 02/11/2020   Last GFR: Lab Results  Component Value Date   GFRNONAA 60 02/11/2020   Patient is on Vitamin D supplement 5,000IU daily. Last visit she was not to goal.   Lab Results  Component Value Date   VD25OH 29 (L) 02/11/2020     Patient is on allopurinol for gout and does not report a recent flare.  Lab Results  Component Value Date   LABURIC 4.0 02/11/2020    Medication Review: Current Outpatient Medications on File Prior to Visit  Medication Sig Dispense Refill   allopurinol (ZYLOPRIM) 300 MG tablet TAKE 1 TABLET BY MOUTH DAILY TO PREVENT GOUT 90 tablet 3   aspirin EC 81 MG tablet Take 81 mg by mouth daily.     atenolol (TENORMIN) 50 MG tablet Take 1 tablet Daily for BP 90 tablet 3   cholecalciferol (VITAMIN D) 1000 UNITS tablet Take 5,000 Units by mouth every other day.      citalopram (CELEXA) 20 MG tablet Take 1 tablet Daily for Mood 90 tablet 3   diltiazem (CARDIZEM CD) 240 MG 24 hr capsule TAKE 1 CAPSULE BY MOUTH DAILY FOR BLOOD PRESSURE 90 capsule 3   fish oil-omega-3 fatty acids 1000 MG capsule Take 1 g by mouth daily.     FREESTYLE LITE test strip   12   furosemide (LASIX) 40 MG tablet TAKE 1/2 TO 1 TABLET BY MOUTH ONCE DAILY FOR FLUID RETENTION 90 tablet 3   Lancets (FREESTYLE) lancets   12   lisinopril (ZESTRIL) 20 MG tablet Take 1 tablet Daily for BP 90 tablet 3   pravastatin (PRAVACHOL) 40 MG tablet Take 1 tablet at Bedtime for Cholesterol 90 tablet 3   triamcinolone cream (KENALOG) 0.1 % as needed.   2   No current facility-administered medications on file prior to visit.    Allergies  Allergen Reactions   Buspar  [Buspirone]     dysphoria   Lipitor [Atorvastatin]     Myalgias   Metformin And Related     Gas/bloating   Nsaids     GI upset   Sulfa Antibiotics Other (See Comments)    Reaction=throat itching   Sulfonamide Derivatives     Current Problems (verified) Patient Active Problem List   Diagnosis Date Noted   CKD stage 2 due to type 2 diabetes mellitus ([place]) 03/06/2018   Overweight (BMI 25.0-29.9) 03/06/2018   Osteopenia 03/06/2018   Esophageal reflux 09/06/2015   Vitamin D deficiency 06/08/2013   Medication management 06/08/2013   Hypertension    Hyperlipemia    Gout    Type 2 diabetes mellitus with stage 3 chronic kidney disease, without long-term current use of insulin ([place])     Screening Tests Immunization History  Administered Date(s) Administered   Influenza Split 07/13/2014   Influenza, High Dose Seasonal PF 04/14/2015, 04/16/2017, 06/13/2018, 04/08/2019   PFIZER SARS-COV-2 Vaccination 08/21/2019, 09/11/2019, 05/19/2020   Pneumococcal-Unspecified 06/07/2003   Td 06/02/2007, 11/12/2017   Zoster  05/30/2012   Cologuard + 11/2017 results in colonoscopy Last colonoscopy: 12/2017 - does not want another Last mammogram: 02/2020 DEXA:02/2019 osteopenia very close to osteoporosis  Prior vaccinations: TD or Tdap: 2013  Influenza: 04/2020  Pneumococcal: 2008 Prevnar13: declines Shingles/Zostavax: 2013   Names of Other Physician/Practitioners you currently use: 1. [facility] here for primary care 2. Dr. Einar Gip, eye doctor, last visit 2021 - report received and abstracted 3. Dr. Donn Pierini, dentist, last visit 2021  Patient Care Team: Unk Pinto, MD as PCP - General (Internal Medicine) Jacelyn Pi, MD as Consulting Physician (Endocrinology)  SURGICAL HISTORY She  has a past surgical history that includes Knee arthroscopy; Abdominal hysterectomy; and Biopsy breast. FAMILY HISTORY Her family history  includes CVA in her father; Diabetes in her mother; Hypertension in her father. SOCIAL HISTORY She  reports that she quit smoking about 13 years ago. She has never used smokeless tobacco. She reports current alcohol use of about 2.0 standard drinks of alcohol per week.   MEDICARE WELLNESS OBJECTIVES: Physical activity: Current Exercise Habits: Home exercise routine, Type of exercise: walking, Time (Minutes): 30, Frequency (Times/Week): 7, Weekly Exercise (Minutes/Week): 210, Exercise limited by: None identified Cardiac risk factors: Cardiac Risk Factors include: advanced age (>56men, >89 women);hypertension;dyslipidemia;obesity (BMI >30kg/m2) Depression/mood screen:   Depression screen [facility] 2/9 05/30/2020  Decreased Interest 0  Down, Depressed, Hopeless 0  PHQ - 2 Score 0    ADLs:  In your present state of health, do you have any difficulty performing the following activities: 05/30/2020 02/10/2020  Hearing? N N  Vision? N N  Difficulty concentrating or making decisions? N N  Walking or climbing stairs? N N  Dressing or bathing? N N  Doing errands, shopping? N N  Preparing Food and eating ? N -  Using the Toilet? N -  In the past six months, have you accidently leaked urine? N -  Do you have problems with loss of bowel control? N -  Managing your Medications? N -  Managing your Finances? N -  Housekeeping or managing your Housekeeping? N -  Some recent data might be hidden    Cognitive Testing  Alert? Yes  Normal Appearance?Yes  Oriented to person? Yes  Place? Yes   Time? Yes  Recall of three objects?  Yes  Can perform simple calculations? Yes  Displays appropriate judgment?Yes  Can read the correct time from a watch face?Yes  EOL planning: Does Patient Have a Medical Advance Directive?: Yes Type of Advance Directive: Healthcare Power of Attorney, Living will Does patient want to make changes to medical advance directive?: No - Patient declined Would patient like information on  creating a medical advance directive?: No - Patient declined  Review of Systems  Constitutional: Negative for chills, fever and weight loss.  HENT: Negative for ear discharge, ear pain, hearing loss and tinnitus.   Eyes: Negative for blurred vision, double vision and photophobia.  Respiratory: Negative.  Negative for cough and hemoptysis.   Cardiovascular: Negative.  Negative for chest pain, palpitations, orthopnea and claudication.  Gastrointestinal: Negative.  Negative for abdominal pain, diarrhea, heartburn, nausea and vomiting.  Genitourinary: Negative.  Negative for dysuria, frequency and urgency.  Musculoskeletal: Negative.  Negative for back pain, falls, joint pain, myalgias and neck pain.  Skin: Negative for itching and rash.  Neurological: Negative.  Negative for dizziness, tingling, tremors, sensory change, speech change and headaches.  Endo/Heme/Allergies: Negative.  Negative for environmental allergies and polydipsia. Does not bruise/bleed easily.  Psychiatric/Behavioral: Negative.  Negative for depression, hallucinations, substance abuse and suicidal ideas. The patient is not nervous/anxious and does not have insomnia.      Objective:     Today's Vitals   05/30/20 1141  BP: 110/86  Pulse: 67  Temp: (!) 97.5 F (36.4 C)  SpO2: 95%  Weight: 165 lb (74.8 kg)  Height: 5' 4.5" (1.638 m)   Body mass index is 27.88 kg/m.  General appearance: alert, no distress, WD/WN, female HEENT: normocephalic, sclerae anicteric, TMs pearly, nares patent, no discharge or erythema, pharynx normal Oral cavity: MMM, no lesions Neck: supple, no lymphadenopathy, no thyromegaly, no masses Heart: RRR, normal S1, S2, no murmurs Lungs: CTA bilaterally, no wheezes, rhonchi, or rales Abdomen: +bs, soft, non tender, non distended, no masses, no hepatomegaly, no splenomegaly Musculoskeletal: nontender, no swelling, no obvious deformity Extremities: no edema, no cyanosis, no clubbing Pulses: 2+  symmetric, upper and lower extremities, normal cap refill Neurological: alert, oriented x 3, CN2-12 intact, strength normal upper extremities and lower extremities, sensation normal throughout, DTRs 2+ throughout, no cerebellar signs, gait normal Psychiatric: normal affect, behavior normal, pleasant   Medicare Attestation I have personally reviewed: The patient's medical and social history Their use of alcohol, tobacco or illicit drugs Their current medications and supplements The patient's functional ability including ADLs,fall risks, home safety risks, cognitive, and hearing and visual impairment Diet and physical activities Evidence for depression or mood disorders  The patient's weight, height, BMI, and visual acuity have been recorded in the chart.  I have made referrals, counseling, and provided education to the patient based on review of the above and I have provided the patient with a written personalized care plan for preventive services.      Garnet Sierras, NP   05/30/2020

## 2020-05-30 NOTE — Patient Instructions (Addendum)
Ms. Cumby , Thank you for taking time to come for your Medicare Wellness Visit. I appreciate your ongoing commitment to your health goals. Please review the following plan we discussed and let me know if I can assist you in the future.   These are the goals we discussed: Goals   Increase walking and physical activity     This is a list of the screening recommended for you and due dates:  Health Maintenance  Topic Date Due  . Eye exam for diabetics  11/13/2018  . Flu Shot  02/28/2020  . Hemoglobin A1C  08/13/2020  . Complete foot exam   02/09/2021  . Mammogram  03/24/2021  . Tetanus Vaccine  11/13/2027  . DEXA scan (bone density measurement)  Completed  . COVID-19 Vaccine  Completed  . Pneumonia vaccines  Discontinued   All of your health maintenance items are up to date.  We discussed taking a baby aspirin every other day.  You have had the zosatvax vaccination for the shingles.  Shingrix is recommended to help decrease severity of this virus should you get it.  Here is some information about this vaccination.   Ask insurance and pharmacy about shingrix - it is a 2 part shot that we will not be getting in the office.   Suggest getting AFTER covid vaccines, have to wait at least a month This shot can make you feel bad due to such good immune response it can trigger some inflammation so take tylenol or aleve day of or day after and plan on resting.   Can go to [email or web address] for more information  Shingrix Vaccination  Two vaccines are licensed and recommended to prevent shingles in the [place] Zoster vaccine live (ZVL, Zostavax) has been in use since 2006. Recombinant zoster vaccine (RZV, Shingrix), has been in use since 2017 and is recommended by AC[employer] as the preferred shingles vaccine.  What Everyone Should Know about Shingles Vaccine (Shingrix) One of the Recommended Vaccines by Disease Shingles vaccination is the only  way to protect against shingles and postherpetic neuralgia (PHN), the most common complication from shingles. CDC recommends that healthy adults 50 years and older get two doses of the shingles vaccine called Shingrix (recombinant zoster vaccine), separated by 2 to 6 months, to prevent shingles and the complications from the disease. Your doctor or pharmacist can give you Shingrix as a shot in your upper arm. Shingrix provides strong protection against shingles and PHN. Two doses of Shingrix is more than 90% effective at preventing shingles and PHN. Protection stays above 85% for at least the first four years after you get vaccinated. Shingrix is the preferred vaccine, over Zostavax (zoster vaccine live), a shingles vaccine in use since 2006. Zostavax may still be used to prevent shingles in healthy adults 60 years and older. For example, you could use Zostavax if a person is allergic to Shingrix, prefers Zostavax, or requests immediate vaccination and Shingrix is unavailable. Who Should Get Shingrix? Healthy adults 50 years and older should get two doses of Shingrix, separated by 2 to 6 months. You should get Shingrix even if in the past you . had shingles  . received Zostavax  . are not sure if you had chickenpox There is no maximum age for getting Shingrix. If you had shingles in the past, you can get Shingrix to help prevent future occurrences of the disease. There is no specific length of time that you need to wait after having shingles before you can  receive Shingrix, but generally you should make sure the shingles rash has gone away before getting vaccinated. You can get Shingrix whether or not you remember having had chickenpox in the past. Studies show that more than 99% of Americans 40 years and older have had chickenpox, even if they don't remember having the disease. Chickenpox and shingles are related because they are caused by the same virus (varicella zoster virus). After a person recovers  from chickenpox, the virus stays dormant (inactive) in the body. It can reactivate years later and cause shingles. If you had Zostavax in the recent past, you should wait at least eight weeks before getting Shingrix. Talk to your healthcare provider to determine the best time to get Shingrix. Shingrix is available in [facility] and pharmacies. To find doctor's offices or pharmacies near you that offer the vaccine, visit [employer] Vaccine FinderExternal. If you have questions about Shingrix, talk with your healthcare provider. Vaccine for Those 1 Years and Older  Shingrix reduces the risk of shingles and PHN by more than 90% in people 22 and older. CDC recommends the vaccine for healthy adults 17 and older.  Who Should Not Get Shingrix? You should not get Shingrix if you: . have ever had a severe allergic reaction to any component of the vaccine or after a dose of Shingrix  . tested negative for immunity to varicella zoster virus. If you test negative, you should get chickenpox vaccine.  . currently have shingles  . currently are pregnant or breastfeeding. Women who are pregnant or breastfeeding should wait to get Shingrix.  Marland Kitchen receive specific antiviral drugs (acyclovir, famciclovir, or valacyclovir) 24 hours before vaccination (avoid use of these antiviral drugs for 14 days after vaccination)- zoster vaccine live only If you have a minor acute (starts suddenly) illness, such as a cold, you may get Shingrix. But if you have a moderate or severe acute illness, you should usually wait until you recover before getting the vaccine. This includes anyone with a temperature of 101.55F or higher. The side effects of the Shingrix are temporary, and usually last 2 to 3 days. While you may experience pain for a few days after getting Shingrix, the pain will be less severe than having shingles and the complications from the disease. How Well Does Shingrix Work? Two doses of Shingrix provides strong  protection against shingles and postherpetic neuralgia (PHN), the most common complication of shingles. . In adults 32 to 83 years old who got two doses, Shingrix was 97% effective in preventing shingles; among adults 70 years and older, Shingrix was 91% effective.  . In adults 48 to 83 years old who got two doses, Shingrix was 91% effective in preventing PHN; among adults 70 years and older, Shingrix was 89% effective. Shingrix protection remained high (more than 85%) in people 70 years and older throughout the four years following vaccination. Since your risk of shingles and PHN increases as you get older, it is important to have strong protection against shingles in your older years. Top of Page  What Are the Possible Side Effects of Shingrix? Studies show that Shingrix is safe. The vaccine helps your body create a strong defense against shingles. As a result, you are likely to have temporary side effects from getting the shots. The side effects may affect your ability to do normal daily activities for 2 to 3 days. Most people got a sore arm with mild or moderate pain after getting Shingrix, and some also had redness and swelling where  they got the shot. Some people felt tired, had muscle pain, a headache, shivering, fever, stomach pain, or nausea. About 1 out of 6 people who got Shingrix experienced side effects that prevented them from doing regular activities. Symptoms went away on their own in about 2 to 3 days. Side effects were more common in younger people. You might have a reaction to the first or second dose of Shingrix, or both doses. If you experience side effects, you may choose to take over-the-counter pain medicine such as ibuprofen or acetaminophen. If you experience side effects from Shingrix, you should report them to the Vaccine Adverse Event Reporting System (VAERS). Your doctor might file this report, or you can do it yourself through the VAERS websiteExternal, or by calling  [phone]. If you have any questions about side effects from Shingrix, talk with your doctor. The shingles vaccine does not contain thimerosal (a preservative containing mercury). Top of Page  When Should I See a Doctor Because of the Side Effects I Experience From Shingrix? In clinical trials, Shingrix was not associated with serious adverse events. In fact, serious side effects from vaccines are extremely rare. For example, for every 1 million doses of a vaccine given, only one or two people may have a severe allergic reaction. Signs of an allergic reaction happen within minutes or hours after vaccination and include hives, swelling of the face and throat, difficulty breathing, a fast heartbeat, dizziness, or weakness. If you experience these or any other life-threatening symptoms, see a doctor right away. Shingrix causes a strong response in your immune system, so it may produce short-term side effects more intense than you are used to from other vaccines. These side effects can be uncomfortable, but they are expected and usually go away on their own in 2 or 3 days. Top of Page  How Can I Pay For Shingrix? There are several ways shingles vaccine may be paid for: Medicare . Medicare Part D plans cover the shingles vaccine, but there may be a cost to you depending on your plan. There may be a copay for the vaccine, or you may need to pay in full then get reimbursed for a certain amount.  . Medicare Part B does not cover the shingles vaccine. Medicaid . Medicaid may or may not cover the vaccine. Contact your insurer to find out. Private health insurance . Many private health insurance plans will cover the vaccine, but there may be a cost to you depending on your plan. Contact your insurer to find out. Vaccine assistance programs . Some pharmaceutical companies provide vaccines to eligible adults who cannot afford them. You may want to check with the vaccine manufacturer, [employer], about  Shingrix. If you do not currently have health insurance, learn more about affordable health coverage optionsExternal. To find doctor's offices or pharmacies near you that offer the vaccine, visit HealthMap Vaccine FinderExternal.

## 2020-05-31 LAB — CBC WITH DIFFERENTIAL/PLATELET
Absolute Monocytes: 535 cells/uL (ref 200–950)
Basophils Absolute: 57 cells/uL (ref 0–200)
Basophils Relative: 0.7 %
Eosinophils Absolute: 138 cells/uL (ref 15–500)
Eosinophils Relative: 1.7 %
HCT: 40.1 % (ref 35.0–45.0)
Hemoglobin: 13.7 g/dL (ref 11.7–15.5)
Lymphs Abs: 2722 cells/uL (ref 850–3900)
MCH: 33.6 pg — ABNORMAL HIGH (ref 27.0–33.0)
MCHC: 34.2 g/dL (ref 32.0–36.0)
MCV: 98.3 fL (ref 80.0–100.0)
MPV: 10.8 fL (ref 7.5–12.5)
Monocytes Relative: 6.6 %
Neutro Abs: 4649 cells/uL (ref 1500–7800)
Neutrophils Relative %: 57.4 %
Platelets: 197 10*3/uL (ref 140–400)
RBC: 4.08 10*6/uL (ref 3.80–5.10)
RDW: 13.3 % (ref 11.0–15.0)
Total Lymphocyte: 33.6 %
WBC: 8.1 10*3/uL (ref 3.8–10.8)

## 2020-05-31 LAB — COMPLETE METABOLIC PANEL WITH GFR
AG Ratio: 1.6 (calc) (ref 1.0–2.5)
ALT: 12 U/L (ref 6–29)
AST: 15 U/L (ref 10–35)
Albumin: 4.1 g/dL (ref 3.6–5.1)
Alkaline phosphatase (APISO): 51 U/L (ref 37–153)
BUN: 17 mg/dL (ref 7–25)
CO2: 31 mmol/L (ref 20–32)
Calcium: 9.4 mg/dL (ref 8.6–10.4)
Chloride: 104 mmol/L (ref 98–110)
Creat: 0.86 mg/dL (ref 0.60–0.88)
GFR, Est African American: 72 mL/min/{1.73_m2} (ref >=60)
GFR, Est Non African American: 62 mL/min/{1.73_m2} (ref >=60)
Globulin: 2.5 g/dL (calc) (ref 1.9–3.7)
Glucose, Bld: 93 mg/dL (ref 65–99)
Potassium: 4.3 mmol/L (ref 3.5–5.3)
Sodium: 141 mmol/L (ref 135–146)
Total Bilirubin: 0.5 mg/dL (ref 0.2–1.2)
Total Protein: 6.6 g/dL (ref 6.1–8.1)

## 2020-05-31 LAB — LIPID PANEL
Cholesterol: 173 mg/dL (ref <200)
HDL: 54 mg/dL (ref >=50)
LDL Cholesterol (Calc): 83 mg/dL (calc)
Non-HDL Cholesterol (Calc): 119 mg/dL (calc) (ref <130)
Total CHOL/HDL Ratio: 3.2 (calc) (ref <5.0)
Triglycerides: 270 mg/dL — ABNORMAL HIGH (ref <150)

## 2020-06-06 DIAGNOSIS — M7981 Nontraumatic hematoma of soft tissue: Secondary | ICD-10-CM | POA: Diagnosis not present

## 2020-06-06 DIAGNOSIS — I8311 Varicose veins of right lower extremity with inflammation: Secondary | ICD-10-CM | POA: Diagnosis not present

## 2020-06-06 DIAGNOSIS — I83811 Varicose veins of right lower extremities with pain: Secondary | ICD-10-CM | POA: Diagnosis not present

## 2020-06-17 ENCOUNTER — Other Ambulatory Visit: Payer: Self-pay | Admitting: Internal Medicine

## 2020-06-20 DIAGNOSIS — I8311 Varicose veins of right lower extremity with inflammation: Secondary | ICD-10-CM | POA: Diagnosis not present

## 2020-06-20 DIAGNOSIS — M7981 Nontraumatic hematoma of soft tissue: Secondary | ICD-10-CM | POA: Diagnosis not present

## 2020-06-20 DIAGNOSIS — I83811 Varicose veins of right lower extremities with pain: Secondary | ICD-10-CM | POA: Diagnosis not present

## 2020-07-05 DIAGNOSIS — I8311 Varicose veins of right lower extremity with inflammation: Secondary | ICD-10-CM | POA: Diagnosis not present

## 2020-07-05 DIAGNOSIS — M7981 Nontraumatic hematoma of soft tissue: Secondary | ICD-10-CM | POA: Diagnosis not present

## 2020-07-11 ENCOUNTER — Other Ambulatory Visit: Payer: Self-pay | Admitting: Internal Medicine

## 2020-07-18 DIAGNOSIS — I824Z1 Acute embolism and thrombosis of unspecified deep veins of right distal lower extremity: Secondary | ICD-10-CM | POA: Diagnosis not present

## 2020-09-01 NOTE — Progress Notes (Signed)
History of Present Illness:      This very nice 86 y.o.  WWF presents for 6 month follow up with HTN, HLD, Pre-Diabetes and Vitamin D Deficiency.       Patient is treated for HTN & BP has been controlled at home. Today's BP is at goal - 130/70. Patient has had no complaints of any cardiac type chest pain, palpitations, dyspnea Vertell Limber /PND, dizziness, claudication, or dependent edema.      Hyperlipidemia is controlled with diet & Pravastatin. Patient denies myalgias or other med SE's. Last Lipids were at goal except elevated Trig's:  Lab Results  Component Value Date   CHOL 173 05/30/2020   HDL 54 05/30/2020   LDLCALC 83 05/30/2020   TRIG 270 (H) 05/30/2020   CHOLHDL 3.2 05/30/2020    Also, the patient has history of T2_NIDDM  (2007) w/CKD2  (GFR 62) and which she attempts to control by diet and has had no symptoms of reactive hypoglycemia, diabetic polys, paresthesias or visual blurring.  Last A1c was not at goal:  Lab Results  Component Value Date   HGBA1C 6.6 (H) 02/11/2020           Further, the patient also has history of Vitamin D Deficiency ("30" /2019) and supplements vitamin D without any suspected side-effects. Last vitamin D was still very low (goal 70-100):  Lab Results  Component Value Date   VD25OH 29 (L) 02/11/2020    Medications  Medication Sig  . allopurinol  300 MG tablet TAKE 1 TABLET  DAILY   . aspirin EC 81 MG tablet Take  daily.  Marland Kitchen atenolol  50 MG  Take  1 tablet Daily   . VITAMIN D 1000 UNITS Take 5,000 Units    every other    day.   . citalopram 20 MG tablet TAKE 1 TABLET DAILY FOR MOOD  . diltiazem CD 240 MG 24 hr  TAKE 1 CAPSULE  DAILY   . fish oil-omega-3  1000 MG cap Take daily.  . furosemide 40 MG tablet TAKE 1/2 TO 1 TABLET  DAILY   . lisinopril  20 MG tablet Take 1 tablet Daily     . pravastatin  40 MG tablet Take  1 tablet at Bedtime  . triamcinolone crm  0.1 % as needed.     Allergies  Allergen Reactions  . Buspar  [Buspirone]     dysphoria  . Lipitor [Atorvastatin]     Myalgias  . Metformin And Related     Gas/bloating  . Nsaids     GI upset  . Sulfa Antibiotics Other (See Comments)    Reaction=throat itching  . Sulfonamide Derivatives     PMHx:   Past Medical History:  Diagnosis Date  . A-fib ([place])   . Anxiety   . Arthritis   . Diabetes mellitus   . Gout   . Hyperlipemia   . Hypertension     Immunization History  Administered Date(s) Administered  . Influenza Split 07/13/2014  . Influenza, HD Seasonal PF 04/14/2015, 04/16/2017, 06/13/2018, 04/08/2019  . PFIZER  SARS-COV-2 Vacc 08/21/2019, 09/11/2019, 05/19/2020  . Pneumococcal-23 06/07/2003  . Td 06/02/2007, 11/12/2017  . Zoster 05/30/2012    FHx:    Reviewed / unchanged  SHx:    Reviewed / unchanged   Systems Review:  Constitutional: Denies fever, chills, wt changes, headaches, insomnia, fatigue, night sweats, change in appetite. Eyes: Denies redness, blurred vision, diplopia, discharge, itchy, watery eyes.  ENT: Denies  discharge, congestion, post nasal drip, epistaxis, sore throat, earache, hearing loss, dental pain, tinnitus, vertigo, sinus pain, snoring.  CV: Denies chest pain, palpitations, irregular heartbeat, syncope, dyspnea, diaphoresis, orthopnea, PND, claudication or edema. Respiratory: denies cough, dyspnea, DOE, pleurisy, hoarseness, laryngitis, wheezing.  Gastrointestinal: Denies dysphagia, odynophagia, heartburn, reflux, water brash, abdominal pain or cramps, nausea, vomiting, bloating, diarrhea, constipation, hematemesis, melena, hematochezia  or hemorrhoids. Genitourinary: Denies dysuria, frequency, urgency, nocturia, hesitancy, discharge, hematuria or flank pain. Musculoskeletal: Denies arthralgias, myalgias, stiffness, jt. swelling, pain, limping or strain/sprain.  Skin: Denies pruritus, rash, hives, warts, acne, eczema or change in skin lesion(s). Neuro: No weakness, tremor, incoordination, spasms,  paresthesia or pain. Psychiatric: Denies confusion, memory loss or sensory loss. Endo: Denies change in weight, skin or hair change.  Heme/Lymph: No excessive bleeding, bruising or enlarged lymph nodes.  Physical Exam  BP 130/70   Pulse 63   Temp (!) 97.5 F (36.4 C)   Wt 160 lb (72.6 kg)   SpO2 96%   BMI 27.04 kg/m   Appears  well nourished, well groomed  and in no distress.  Eyes: PERRLA, EOMs, conjunctiva no swelling or erythema. Sinuses: No frontal/maxillary tenderness ENT/Mouth: EAC's clear, TM's nl w/o erythema, bulging. Nares clear w/o erythema, swelling, exudates. Oropharynx clear without erythema or exudates. Oral hygiene is good. Tongue normal, non obstructing. Hearing intact.  Neck: Supple. Thyroid not palpable. Car 2+/2+ without bruits, nodes or JVD. Chest: Respirations nl with BS clear & equal w/o rales, rhonchi, wheezing or stridor.  Cor: Heart sounds normal w/ regular rate and rhythm without sig. murmurs, gallops, clicks or rubs. Peripheral pulses normal and equal  without edema.  Abdomen: Soft & bowel sounds normal. Non-tender w/o guarding, rebound, hernias, masses or organomegaly.  Lymphatics: Unremarkable.  Musculoskeletal: Full ROM all peripheral extremities, joint stability, 5/5 strength and normal gait.  Skin: Warm, dry without exposed rashes, lesions or ecchymosis apparent.  Neuro: Cranial nerves intact, reflexes equal bilaterally. Sensory-motor testing grossly intact. Tendon reflexes grossly intact.  Pysch: Alert & oriented x 3.  Insight and judgement nl & appropriate. No ideations.  Assessment and Plan:  1. Essential hypertension  - Continue medication, monitor blood pressure at home.  - Continue DASH diet.  Reminder to go to the ER if any CP,  SOB, nausea, dizziness, severe HA, changes vision/speech.  - CBC with Differential/Platelet - COMPLETE METABOLIC PANEL WITH GFR - Magnesium - TSH  2. Hyperlipidemia associated with type 2 diabetes mellitus  ([place])  - Continue diet/meds, exercise,& lifestyle modifications.  - Continue monitor periodic cholesterol/liver & renal functions   - Lipid panel - TSH  3. Type 2 diabetes mellitus with stage 2 chronic kidney  disease, without long-term current use of insulin (HCC)  - Continue diet, exercise  - Lifestyle modifications.  - Monitor appropriate labs.  - Hemoglobin A1c - Insulin, random  4. Vitamin D deficiency  - Continue supplementation.  - VITAMIN D 25 Hydroxy  5. Idiopathic gout  - Uric acid  6. Medication managem - CBC with Differential/Platelet - COMPLETE METABOLIC PANEL WITH GFR - Magnesium - Lipid panel - TSH - Hemoglobin A1c - Insulin, random - VITAMIN D 25 Hydroxy - Uric acid       Discussed  regular exercise, BP monitoring, weight control to achieve/maintain BMI less than 25 and discussed med and SE's. Recommended labs to assess and monitor clinical status with further disposition pending results of labs.  I discussed the assessment and treatment plan with the patient. The patient was  provided an opportunity to ask questions and all were answered. The patient agreed with the plan and demonstrated an understanding of the instructions.  I provided over 30 minutes of exam, counseling, chart review and  complex critical decision making.         The patient was advised to call back or seek an in-person evaluation if the symptoms worsen or if the condition fails to improve as anticipated.   Kirtland Bouchard, MD

## 2020-09-02 ENCOUNTER — Other Ambulatory Visit: Payer: Self-pay

## 2020-09-02 ENCOUNTER — Ambulatory Visit: Payer: Medicare Other | Admitting: Internal Medicine

## 2020-09-02 ENCOUNTER — Encounter: Payer: Self-pay | Admitting: Internal Medicine

## 2020-09-02 VITALS — BP 130/70 | HR 63 | Temp 97.5°F | Ht 64.5 in | Wt 160.0 lb

## 2020-09-02 DIAGNOSIS — E1169 Type 2 diabetes mellitus with other specified complication: Secondary | ICD-10-CM

## 2020-09-02 DIAGNOSIS — E1122 Type 2 diabetes mellitus with diabetic chronic kidney disease: Secondary | ICD-10-CM | POA: Diagnosis not present

## 2020-09-02 DIAGNOSIS — I10 Essential (primary) hypertension: Secondary | ICD-10-CM

## 2020-09-02 DIAGNOSIS — E559 Vitamin D deficiency, unspecified: Secondary | ICD-10-CM | POA: Diagnosis not present

## 2020-09-02 DIAGNOSIS — Z79899 Other long term (current) drug therapy: Secondary | ICD-10-CM

## 2020-09-02 DIAGNOSIS — N182 Chronic kidney disease, stage 2 (mild): Secondary | ICD-10-CM

## 2020-09-02 DIAGNOSIS — E785 Hyperlipidemia, unspecified: Secondary | ICD-10-CM

## 2020-09-02 DIAGNOSIS — M1000 Idiopathic gout, unspecified site: Secondary | ICD-10-CM

## 2020-09-02 NOTE — Patient Instructions (Signed)

## 2020-09-03 ENCOUNTER — Encounter: Payer: Self-pay | Admitting: Internal Medicine

## 2020-09-03 NOTE — Progress Notes (Signed)
========================================================== ==========================================================  -    Total Chol = 179   and   LDL Chol = 99  - Both   Great   - Very low risk for Heart Attack  / Stroke ========================================================  - But Triglycerides (   270   ) or fats in blood are too high  (goal is less than 150)    - Recommend avoid fried & greasy foods,  sweets / candy,   - Avoid white rice  (brown or wild rice or Quinoa is OK),   - Avoid white potatoes  (sweet potatoes are OK)   - Avoid anything made from white flour  - bagels, doughnuts, rolls, buns, biscuits, white and   wheat breads, pizza crust and traditional  pasta made of white flour & egg white  - (vegetarian pasta or spinach or wheat pasta is OK).    - Multi-grain bread is OK - like multi-grain flat bread or  sandwich thins.   - Avoid alcohol in excess.   - Exercise is also important. ========================================================== ==========================================================  -  A1c 6.6% - still about the same over the last 7-8 years   - Recommend take Cinnamon 1,000 mg capsules                                          2 to 3 x /day with Meals to help lower Blood sugars ========================================================== ==========================================================  -  Vitamin D = 41 - too Low   - Vitamin D goal is between 70-100.   - Please increase your  Vita D 5,000 unit capsules to 1 capsule EVERY day  - It is very important as a natural anti-inflammatory and helping the  immune system protect against viral infections, like the Covid-19    helping hair, skin, and nails, as well as reducing stroke and  heart attack risk.   - It helps your bones and helps with mood.  - It also decreases numerous cancer risks so please  take it as directed.   - Low Vit D is associated with a 200-300% higher risk  for  CANCER   and 200-300% higher risk for HEART   ATTACK  &  STROKE.    - It is also associated with higher death rate at younger ages,   autoimmune diseases like Rheumatoid arthritis, Lupus,  Multiple Sclerosis.     - Also many other serious conditions, like depression, Alzheimer's  Dementia, infertility, muscle aches, fatigue, fibromyalgia   - just to name a few. ========================================================== ==========================================================  -  Uric Acid /Gout test is Normal, So please continue Allopurinol ========================================================== ==========================================================  - All Else - CBC - Kidneys - Electrolytes - Liver - Magnesium & Thyroid    - all  Normal / OK ========================================================== ==========================================================

## 2020-09-05 LAB — COMPLETE METABOLIC PANEL WITH GFR
AG Ratio: 1.8 (calc) (ref 1.0–2.5)
ALT: 9 U/L (ref 6–29)
AST: 15 U/L (ref 10–35)
Albumin: 4.2 g/dL (ref 3.6–5.1)
Alkaline phosphatase (APISO): 49 U/L (ref 37–153)
BUN: 18 mg/dL (ref 7–25)
CO2: 31 mmol/L (ref 20–32)
Calcium: 9.6 mg/dL (ref 8.6–10.4)
Chloride: 103 mmol/L (ref 98–110)
Creat: 0.78 mg/dL (ref 0.60–0.88)
GFR, Est African American: 81 mL/min/{1.73_m2} (ref >=60)
GFR, Est Non African American: 70 mL/min/{1.73_m2} (ref >=60)
Globulin: 2.3 g/dL (calc) (ref 1.9–3.7)
Glucose, Bld: 84 mg/dL (ref 65–99)
Potassium: 3.9 mmol/L (ref 3.5–5.3)
Sodium: 141 mmol/L (ref 135–146)
Total Bilirubin: 0.6 mg/dL (ref 0.2–1.2)
Total Protein: 6.5 g/dL (ref 6.1–8.1)

## 2020-09-05 LAB — CBC WITH DIFFERENTIAL/PLATELET
Absolute Monocytes: 561 cells/uL (ref 200–950)
Basophils Absolute: 50 cells/uL (ref 0–200)
Basophils Relative: 0.7 %
Eosinophils Absolute: 114 cells/uL (ref 15–500)
Eosinophils Relative: 1.6 %
HCT: 40.7 % (ref 35.0–45.0)
Hemoglobin: 13.9 g/dL (ref 11.7–15.5)
Lymphs Abs: 2506 cells/uL (ref 850–3900)
MCH: 32.8 pg (ref 27.0–33.0)
MCHC: 34.2 g/dL (ref 32.0–36.0)
MCV: 96 fL (ref 80.0–100.0)
MPV: 11.2 fL (ref 7.5–12.5)
Monocytes Relative: 7.9 %
Neutro Abs: 3870 cells/uL (ref 1500–7800)
Neutrophils Relative %: 54.5 %
Platelets: 184 10*3/uL (ref 140–400)
RBC: 4.24 10*6/uL (ref 3.80–5.10)
RDW: 13.3 % (ref 11.0–15.0)
Total Lymphocyte: 35.3 %
WBC: 7.1 10*3/uL (ref 3.8–10.8)

## 2020-09-05 LAB — LIPID PANEL
Cholesterol: 179 mg/dL (ref <200)
HDL: 56 mg/dL (ref >=50)
LDL Cholesterol (Calc): 99 mg/dL (calc)
Non-HDL Cholesterol (Calc): 123 mg/dL (calc) (ref <130)
Total CHOL/HDL Ratio: 3.2 (calc) (ref <5.0)
Triglycerides: 142 mg/dL (ref <150)

## 2020-09-05 LAB — URIC ACID: Uric Acid, Serum: 3.8 mg/dL (ref 2.5–7.0)

## 2020-09-05 LAB — HEMOGLOBIN A1C
Hgb A1c MFr Bld: 6.6 % of total Hgb — ABNORMAL HIGH (ref <5.7)
Mean Plasma Glucose: 143 mg/dL
eAG (mmol/L): 7.9 mmol/L

## 2020-09-05 LAB — MAGNESIUM: Magnesium: 2.1 mg/dL (ref 1.5–2.5)

## 2020-09-05 LAB — TSH: TSH: 2.04 mIU/L (ref 0.40–4.50)

## 2020-09-05 LAB — INSULIN, RANDOM: Insulin: 14.9 u[IU]/mL

## 2020-09-05 LAB — VITAMIN D 25 HYDROXY (VIT D DEFICIENCY, FRACTURES): Vit D, 25-Hydroxy: 41 ng/mL (ref 30–100)

## 2020-09-18 NOTE — Progress Notes (Signed)
   History of Present Illness:      This very nice 86 y.o.  [place]  with HTN, HLD, Pre-Diabetes and Vitamin D Deficiency returns for 10 day f/u of  "swelling" of  Rt thigh. In November 2021, patient had ablations of varicosities by Dr Renaldo Reel and in December was discovered to have and apparently developed DVT of the RLE gastrocnemius veins and was switched from low dose ASA to Eliquis 2.5 mg bid.   Medications  .  atenolol (TENORMIN) 50 MG tablet, Take      1 tablet     Daily       for BP .  diltiazem (CARDIZEM CD) 240 MG 24 hr capsule, TAKE 1 CAPSULE BY MOUTH DAILY FOR BLOOD PRESSURE .  furosemide (LASIX) 40 MG tablet, TAKE 1/2 TO 1 TABLET BY MOUTH ONCE DAILY FOR FLUID RETENTION .  lisinopril (ZESTRIL) 20 MG tablet, Take      1 tablet      Daily       for BP .  pravastatin (PRAVACHOL) 40 MG tablet, Take      1 tablet        at Bedtime       for Cholesterol  .  allopurinol (ZYLOPRIM) 300 MG tablet, TAKE 1 TABLET BY MOUTH DAILY TO PREVENT GOUT .  aspirin EC 81 MG tablet, Take 81 mg by mouth daily.  .  cholecalciferol (VITAMIN D) 1000 UNITS tablet, Take 5,000 Units by mouth every other day.  .  citalopram (CELEXA) 20 MG tablet, TAKE 1 TABLET DAILY FOR MOOD .  fish oil-omega-3 fatty acids 1000 MG capsule, Take 1 g by mouth daily. Marland Kitchen  triamcinolone cream (KENALOG) 0.1 %, as needed.   Problem list She has Hypertension; Hyperlipemia; Gout; Type 2 diabetes mellitus with stage 3 chronic kidney disease, without long-term current use of insulin ([place]); Vitamin D deficiency; Medication management; Esophageal reflux; CKD stage 2 due to type 2 diabetes mellitus ([place]); Overweight (BMI 25.0-29.9); and Osteopenia on their problem list.   Observations/Objective:   BP 128/64   P 60   T97 F    R 16   Ht 5' 4.5"    Wt 161 lb 12.8 oz   SpO2 97%   BMI 27.34   HEENT - WNL. Neck - supple.  Chest - Clear equal BS. Cor - Nl HS. RRR w/o sig MGR. PP 1(+). No edema. No thigh or calf pais to compression.   MS- FROM w/o deformities.  Gait Nl. Neuro -  Nl w/o focal abnormalities.  Assessment and Plan:  1. Acute deep vein thrombosis (DVT) of right lower extremity, unspecified vein (HCC)  - advised to continue Eliquis per Dr Angelina Pih recommendations.  Follow Up Instructions:        I discussed the assessment and treatment plan with the patient. The patient was provided an opportunity to ask questions and all were answered. The patient agreed with the plan and demonstrated an understanding of the instructions.       The patient was advised to call back or seek an in-person evaluation if the symptoms worsen or if the condition fails to improve as anticipated.   Kirtland Bouchard, MD

## 2020-09-19 ENCOUNTER — Encounter: Payer: Self-pay | Admitting: Internal Medicine

## 2020-09-19 ENCOUNTER — Ambulatory Visit: Payer: Medicare Other | Admitting: Internal Medicine

## 2020-09-19 ENCOUNTER — Other Ambulatory Visit: Payer: Self-pay

## 2020-09-19 VITALS — BP 128/64 | HR 60 | Temp 97.0°F | Resp 16 | Ht 64.5 in | Wt 161.8 lb

## 2020-09-19 DIAGNOSIS — I82401 Acute embolism and thrombosis of unspecified deep veins of right lower extremity: Secondary | ICD-10-CM

## 2020-09-23 ENCOUNTER — Other Ambulatory Visit: Payer: Self-pay | Admitting: Internal Medicine

## 2020-10-13 ENCOUNTER — Other Ambulatory Visit: Payer: Self-pay

## 2020-10-13 ENCOUNTER — Ambulatory Visit: Payer: Medicare Other | Admitting: Internal Medicine

## 2020-10-13 ENCOUNTER — Encounter: Payer: Self-pay | Admitting: Internal Medicine

## 2020-10-13 VITALS — BP 124/72 | HR 86 | Temp 97.7°F | Resp 16 | Ht 64.5 in | Wt 157.8 lb

## 2020-10-13 DIAGNOSIS — K529 Noninfective gastroenteritis and colitis, unspecified: Secondary | ICD-10-CM

## 2020-10-13 MED ORDER — HYOSCYAMINE SULFATE 0.125 MG SL SUBL: SUBLINGUAL_TABLET | SUBLINGUAL | 0 refills | Status: DC

## 2020-10-13 MED ORDER — CIPROFLOXACIN HCL 250 MG PO TABS: ORAL_TABLET | ORAL | 0 refills | Status: DC

## 2020-10-13 MED ORDER — ONDANSETRON 8 MG PO TBDP: ORAL_TABLET | ORAL | 0 refills | Status: DC

## 2020-10-13 MED ORDER — METRONIDAZOLE 250 MG PO TABS
ORAL_TABLET | ORAL | 0 refills | Status: DC
Start: 2020-10-13 — End: 2020-11-15

## 2020-10-13 NOTE — Progress Notes (Signed)
   History of Present Illness:      Patient is a very nice 86 yo WWF with HTN, HLD, diet T2_DM and Vitamin D Deficiency who presents today with c/o N/V and profuse water diarrhea beginning about 11 pm last night after eating "shrip" in a restaurant last night. Symptoms abated during the night , but after awakening this morning her  N/V & watery diarrhea restarted. Denies fever, chills, sweats, rash or blood in BMs. Reports BM's are consistency of "brown water" .   Medications  .  atenolol (TENORMIN) 50 MG tablet, Take  1 tablet  Daily  for BP  .  diltiazem CD 240 MG , TAKE 1 CAPSULE DAILY FOR BLOOD PRESSURE  .  furosemide  40 MG tablet, TAKE 1/2 TO 1 TABLET ONCE DAILY FOR FLUID RETENTION  .  lisinopril  20 MG tablet, Take  1 tablet  Daily  for BP  .  pravastatin 40 MG tablet, Take  1 tablet  at Bedtime  for Cholesterol  .  allopurinol 300 MG tablet, TAKE 1 TABLET DAILY TO PREVENT GOUT  .  apixaban (ELIQUIS) 2.5 MG , Take  2 times daily.  Marland Kitchen VITAMIN D, Take 5,000 Units b every other day.   .  citalopram (20 MG tablet, TAKE 1 TABLET DAILY FOR MOOD  .  triamcinolone cream  0.1 %, as needed.   Problem list She has Hypertension; Hyperlipemia; Gout; Type 2 diabetes mellitus with stage 3 chronic kidney disease, without long-term current use of insulin ([place]); Vitamin D deficiency; Medication management; Esophageal reflux; CKD stage 2 due to type 2 diabetes mellitus ([place]); Overweight (BMI 25.0-29.9); and Osteopenia on their problem list.   Observations/Objective:   BP 124/72   Pulse 86   Temp 97.7 F (36.5 C)   Resp 16   Ht 5' 4.5" (1.638 m)   Wt 157 lb 12.8 oz (71.6 kg)   SpO2 99%   BMI 26.67 kg/m   HEENT - WNL. Neck - supple.  Chest - Clear equal BS. Cor - Nl HS. RRR w/o sig MGR. PP 1(+).  Abd - Sot, sl distended with hyperactive BS. No point tenderness or guarding.  MS- FROM w/o deformities.  Gait Nl. Neuro -  Nl w/o focal abnormalities.  Assessment and Plan:  1.  Gastroenteritis  - apixaban (ELIQUIS) 2.5 MG TABS tablet; Take by mouth 2 (two) times daily.  - ondansetron  ODT 8 MG d; Dissolve 1 tab under tongue  every 6 hours for  nausea  or vomitting  Disp: 30 tab  - hyoscyamine SL 0.125 MG; Dissolve 1 to 2 tab Under tongue 3 to 4 x /day  if needed for Nausea, vomiting, cramping or diarrhea  Disp: 30 tab  - ciprofloxacin  250 MG; Take  1 tablet  2 x /day : Disp -14 tablet; - metroNIDAZOLE  250 MG tablet; Take  1 tablet  3 x /day  Dispense: 21 tab  Discussed meds. SE's and Diet and RV or ER precautions  Follow Up Instructions:       I discussed the assessment and treatment plan with the patient. The patient was provided an opportunity to ask questions and all were answered. The patient agreed with the plan and demonstrated an understanding of the instructions.       The patient was advised to call back or seek an in-person evaluation if the symptoms worsen or if the condition fails to improve as anticipated.   Kirtland Bouchard, MD

## 2020-10-19 ENCOUNTER — Other Ambulatory Visit: Payer: Self-pay | Admitting: Internal Medicine

## 2020-10-19 ENCOUNTER — Telehealth: Payer: Self-pay | Admitting: *Deleted

## 2020-10-19 DIAGNOSIS — A09 Infectious gastroenteritis and colitis, unspecified: Secondary | ICD-10-CM

## 2020-10-19 NOTE — Telephone Encounter (Signed)
Returned call to patient regarding diarrhea. Patient states she continues to have watery diarrhea, even though she is taking Cipro, Flagly and Levsin. Per Dr Melford Aase, it is OK to take Imodium, up to 12 tablets daily, and Pepto Bismol. An order for stool cultures has been entered, per Dr Melford Aase. Patient is aware.

## 2020-10-21 LAB — GASTROINTESTINAL PATHOGEN PANEL PCR
C. difficile Tox A/B, PCR: NOT DETECTED
Campylobacter, PCR: NOT DETECTED
Cryptosporidium, PCR: NOT DETECTED
E coli (ETEC) LT/ST PCR: NOT DETECTED
E coli (STEC) stx1/stx2, PCR: NOT DETECTED
E coli 0157, PCR: NOT DETECTED
Giardia lamblia, PCR: NOT DETECTED
Norovirus, PCR: NOT DETECTED
Rotavirus A, PCR: NOT DETECTED
Salmonella, PCR: NOT DETECTED
Shigella, PCR: NOT DETECTED

## 2020-10-21 NOTE — Progress Notes (Signed)
============================================================ ============================================================  -    GI Pathogen Panel for Diarrhea infections - returned NEGATIVE  ============================================================ ============================================================

## 2020-11-15 ENCOUNTER — Other Ambulatory Visit: Payer: Self-pay

## 2020-11-15 ENCOUNTER — Emergency Department (HOSPITAL_COMMUNITY): Payer: Medicare Other

## 2020-11-15 ENCOUNTER — Encounter (HOSPITAL_COMMUNITY): Payer: Self-pay | Admitting: Emergency Medicine

## 2020-11-15 ENCOUNTER — Inpatient Hospital Stay (HOSPITAL_COMMUNITY)
Admission: EM | Admit: 2020-11-15 | Discharge: 2020-11-18 | DRG: 309 | Disposition: A | Discharge status: Home / Self Care | Payer: Medicare Other | Attending: Internal Medicine | Admitting: Internal Medicine

## 2020-11-15 DIAGNOSIS — Z66 Do not resuscitate: Secondary | ICD-10-CM | POA: Diagnosis present

## 2020-11-15 DIAGNOSIS — Z87891 Personal history of nicotine dependence: Secondary | ICD-10-CM

## 2020-11-15 DIAGNOSIS — Z79899 Other long term (current) drug therapy: Secondary | ICD-10-CM

## 2020-11-15 DIAGNOSIS — N183 Chronic kidney disease, stage 3 unspecified: Secondary | ICD-10-CM | POA: Diagnosis present

## 2020-11-15 DIAGNOSIS — Z7901 Long term (current) use of anticoagulants: Secondary | ICD-10-CM

## 2020-11-15 DIAGNOSIS — Z882 Allergy status to sulfonamides status: Secondary | ICD-10-CM

## 2020-11-15 DIAGNOSIS — E1122 Type 2 diabetes mellitus with diabetic chronic kidney disease: Secondary | ICD-10-CM | POA: Diagnosis present

## 2020-11-15 DIAGNOSIS — A0472 Enterocolitis due to Clostridium difficile, not specified as recurrent: Secondary | ICD-10-CM | POA: Diagnosis present

## 2020-11-15 DIAGNOSIS — I10 Essential (primary) hypertension: Secondary | ICD-10-CM | POA: Diagnosis present

## 2020-11-15 DIAGNOSIS — Z888 Allergy status to other drugs, medicaments and biological substances status: Secondary | ICD-10-CM

## 2020-11-15 DIAGNOSIS — Z833 Family history of diabetes mellitus: Secondary | ICD-10-CM

## 2020-11-15 DIAGNOSIS — Z886 Allergy status to analgesic agent status: Secondary | ICD-10-CM

## 2020-11-15 DIAGNOSIS — I4891 Unspecified atrial fibrillation: Secondary | ICD-10-CM | POA: Diagnosis not present

## 2020-11-15 DIAGNOSIS — E876 Hypokalemia: Secondary | ICD-10-CM | POA: Diagnosis present

## 2020-11-15 DIAGNOSIS — A020 Salmonella enteritis: Secondary | ICD-10-CM | POA: Diagnosis present

## 2020-11-15 DIAGNOSIS — E785 Hyperlipidemia, unspecified: Secondary | ICD-10-CM | POA: Diagnosis present

## 2020-11-15 DIAGNOSIS — F419 Anxiety disorder, unspecified: Secondary | ICD-10-CM | POA: Diagnosis present

## 2020-11-15 DIAGNOSIS — I129 Hypertensive chronic kidney disease with stage 1 through stage 4 chronic kidney disease, or unspecified chronic kidney disease: Secondary | ICD-10-CM | POA: Diagnosis present

## 2020-11-15 DIAGNOSIS — M109 Gout, unspecified: Secondary | ICD-10-CM | POA: Diagnosis present

## 2020-11-15 DIAGNOSIS — R112 Nausea with vomiting, unspecified: Secondary | ICD-10-CM | POA: Diagnosis present

## 2020-11-15 DIAGNOSIS — Z90710 Acquired absence of both cervix and uterus: Secondary | ICD-10-CM

## 2020-11-15 DIAGNOSIS — Z8249 Family history of ischemic heart disease and other diseases of the circulatory system: Secondary | ICD-10-CM

## 2020-11-15 DIAGNOSIS — A082 Adenoviral enteritis: Secondary | ICD-10-CM | POA: Diagnosis present

## 2020-11-15 DIAGNOSIS — K219 Gastro-esophageal reflux disease without esophagitis: Secondary | ICD-10-CM | POA: Diagnosis present

## 2020-11-15 DIAGNOSIS — R197 Diarrhea, unspecified: Secondary | ICD-10-CM | POA: Diagnosis present

## 2020-11-15 DIAGNOSIS — I480 Paroxysmal atrial fibrillation: Principal | ICD-10-CM | POA: Diagnosis present

## 2020-11-15 DIAGNOSIS — Z20822 Contact with and (suspected) exposure to covid-19: Secondary | ICD-10-CM | POA: Diagnosis present

## 2020-11-15 DIAGNOSIS — I482 Chronic atrial fibrillation, unspecified: Secondary | ICD-10-CM | POA: Insufficient documentation

## 2020-11-15 DIAGNOSIS — E1165 Type 2 diabetes mellitus with hyperglycemia: Secondary | ICD-10-CM | POA: Diagnosis present

## 2020-11-15 DIAGNOSIS — M1990 Unspecified osteoarthritis, unspecified site: Secondary | ICD-10-CM | POA: Diagnosis present

## 2020-11-15 DIAGNOSIS — F32A Depression, unspecified: Secondary | ICD-10-CM | POA: Diagnosis present

## 2020-11-15 LAB — CBC WITH DIFFERENTIAL/PLATELET
Abs Immature Granulocytes: 0.02 10*3/uL (ref 0.00–0.07)
Basophils Absolute: 0 10*3/uL (ref 0.0–0.1)
Basophils Relative: 0 %
Eosinophils Absolute: 0 10*3/uL (ref 0.0–0.5)
Eosinophils Relative: 0 %
HCT: 37.9 % (ref 36.0–46.0)
Hemoglobin: 12.7 g/dL (ref 12.0–15.0)
Immature Granulocytes: 0 %
Lymphocytes Relative: 6 %
Lymphs Abs: 0.7 10*3/uL (ref 0.7–4.0)
MCH: 33.3 pg (ref 26.0–34.0)
MCHC: 33.5 g/dL (ref 30.0–36.0)
MCV: 99.5 fL (ref 80.0–100.0)
Monocytes Absolute: 0.7 10*3/uL (ref 0.1–1.0)
Monocytes Relative: 7 %
Neutro Abs: 9.3 10*3/uL — ABNORMAL HIGH (ref 1.7–7.7)
Neutrophils Relative %: 87 %
Platelets: 149 10*3/uL — ABNORMAL LOW (ref 150–400)
RBC: 3.81 MIL/uL — ABNORMAL LOW (ref 3.87–5.11)
RDW: 14.2 % (ref 11.5–15.5)
WBC: 10.7 10*3/uL — ABNORMAL HIGH (ref 4.0–10.5)
nRBC: 0 % (ref 0.0–0.2)

## 2020-11-15 LAB — COMPREHENSIVE METABOLIC PANEL
ALT: 14 U/L (ref 0–44)
AST: 19 U/L (ref 15–41)
Albumin: 3.7 g/dL (ref 3.5–5.0)
Alkaline Phosphatase: 38 U/L (ref 38–126)
Anion gap: 13 (ref 5–15)
BUN: 15 mg/dL (ref 8–23)
CO2: 19 mmol/L — ABNORMAL LOW (ref 22–32)
Calcium: 9.2 mg/dL (ref 8.9–10.3)
Chloride: 106 mmol/L (ref 98–111)
Creatinine, Ser: 0.84 mg/dL (ref 0.44–1.00)
GFR, Estimated: >60 mL/min (ref >60)
Glucose, Bld: 187 mg/dL — ABNORMAL HIGH (ref 70–99)
Potassium: 3.7 mmol/L (ref 3.5–5.1)
Sodium: 138 mmol/L (ref 135–145)
Total Bilirubin: 0.9 mg/dL (ref 0.3–1.2)
Total Protein: 6.4 g/dL — ABNORMAL LOW (ref 6.5–8.1)

## 2020-11-15 LAB — RESP PANEL BY RT-PCR (FLU A&B, COVID) ARPGX2
Influenza A by PCR: NEGATIVE
Influenza B by PCR: NEGATIVE
SARS Coronavirus 2 by RT PCR: NEGATIVE

## 2020-11-15 LAB — C DIFFICILE QUICK SCREEN W PCR REFLEX
C Diff antigen: POSITIVE — AB
C Diff toxin: NEGATIVE

## 2020-11-15 LAB — LIPASE, BLOOD: Lipase: 24 U/L (ref 11–51)

## 2020-11-15 LAB — CLOSTRIDIUM DIFFICILE BY PCR, REFLEXED: Toxigenic C. Difficile by PCR: NEGATIVE

## 2020-11-15 MED ORDER — DICYCLOMINE HCL 10 MG/ML IM SOLN
20.0000 mg | Freq: Once | INTRAMUSCULAR | Status: AC
  Administered 2020-11-15: 20 mg via INTRAMUSCULAR
  Filled 2020-11-15: qty 2

## 2020-11-15 MED ORDER — DILTIAZEM HCL-DEXTROSE 125-5 MG/125ML-% IV SOLN (PREMIX)
5.0000 mg/h | INTRAVENOUS | Status: DC
  Administered 2020-11-15: 5 mg/h via INTRAVENOUS
  Filled 2020-11-15 (×2): qty 125

## 2020-11-15 MED ORDER — DILTIAZEM HCL-DEXTROSE 125-5 MG/125ML-% IV SOLN (PREMIX)
5.0000 mg/h | INTRAVENOUS | Status: DC
  Administered 2020-11-15 – 2020-11-16 (×2): 5 mg/h via INTRAVENOUS
  Filled 2020-11-15: qty 125

## 2020-11-15 MED ORDER — CITALOPRAM HYDROBROMIDE 20 MG PO TABS
20.0000 mg | ORAL_TABLET | Freq: Every day | ORAL | Status: DC
  Administered 2020-11-16 – 2020-11-18 (×3): 20 mg via ORAL
  Filled 2020-11-15 (×3): qty 1

## 2020-11-15 MED ORDER — ONDANSETRON HCL 4 MG/2ML IJ SOLN
4.0000 mg | Freq: Once | INTRAMUSCULAR | Status: AC
  Administered 2020-11-15: 4 mg via INTRAVENOUS
  Filled 2020-11-15: qty 2

## 2020-11-15 MED ORDER — ONDANSETRON HCL 4 MG/2ML IJ SOLN
4.0000 mg | Freq: Four times a day (QID) | INTRAMUSCULAR | Status: DC | PRN
  Administered 2020-11-15 – 2020-11-16 (×2): 4 mg via INTRAVENOUS
  Filled 2020-11-15 (×2): qty 2

## 2020-11-15 MED ORDER — ACETAMINOPHEN 500 MG PO TABS
1000.0000 mg | ORAL_TABLET | Freq: Once | ORAL | Status: AC
  Administered 2020-11-15: 1000 mg via ORAL
  Filled 2020-11-15: qty 2

## 2020-11-15 MED ORDER — IOHEXOL 300 MG/ML  SOLN
100.0000 mL | Freq: Once | INTRAMUSCULAR | Status: AC | PRN
  Administered 2020-11-15: 100 mL via INTRAVENOUS

## 2020-11-15 MED ORDER — ALLOPURINOL 300 MG PO TABS
300.0000 mg | ORAL_TABLET | Freq: Every day | ORAL | Status: DC
  Administered 2020-11-16 – 2020-11-18 (×3): 300 mg via ORAL
  Filled 2020-11-15 (×3): qty 1

## 2020-11-15 MED ORDER — ACETAMINOPHEN 325 MG PO TABS: 650.0000 mg | ORAL_TABLET | ORAL | Status: DC | PRN

## 2020-11-15 MED ORDER — SODIUM CHLORIDE 0.9 % IV BOLUS
1000.0000 mL | Freq: Once | INTRAVENOUS | Status: AC
  Administered 2020-11-15: 1000 mL via INTRAVENOUS

## 2020-11-15 MED ORDER — APIXABAN 2.5 MG PO TABS
2.5000 mg | ORAL_TABLET | Freq: Two times a day (BID) | ORAL | Status: DC
  Administered 2020-11-15 – 2020-11-16 (×2): 2.5 mg via ORAL
  Filled 2020-11-15 (×2): qty 1

## 2020-11-15 MED ORDER — METOCLOPRAMIDE HCL 5 MG/ML IJ SOLN: 5.0000 mg | Freq: Once | INTRAMUSCULAR | Status: DC

## 2020-11-15 MED ORDER — DILTIAZEM HCL ER COATED BEADS 240 MG PO CP24
240.0000 mg | ORAL_CAPSULE | Freq: Every day | ORAL | Status: DC
  Administered 2020-11-15 – 2020-11-18 (×4): 240 mg via ORAL
  Filled 2020-11-15 (×4): qty 1

## 2020-11-15 MED ORDER — PRAVASTATIN SODIUM 40 MG PO TABS
40.0000 mg | ORAL_TABLET | Freq: Every day | ORAL | Status: DC
  Administered 2020-11-16 – 2020-11-18 (×3): 40 mg via ORAL
  Filled 2020-11-15 (×3): qty 1

## 2020-11-15 MED ORDER — DILTIAZEM LOAD VIA INFUSION
10.0000 mg | Freq: Once | INTRAVENOUS | Status: AC
  Administered 2020-11-15: 10 mg via INTRAVENOUS
  Filled 2020-11-15: qty 10

## 2020-11-15 NOTE — ED Notes (Signed)
Admitting at bedside 

## 2020-11-15 NOTE — ED Notes (Signed)
EDP notified of pt elevated hr. Pt has been to RR twice in a few minutes and some nausea.

## 2020-11-15 NOTE — ED Triage Notes (Signed)
Pt here with c/o n/v/d that started sound 2 am this morning , pt had same thing about 3 weeks

## 2020-11-15 NOTE — ED Provider Notes (Signed)
[facility] EMERGENCY DEPARTMENT Provider Note   CSN: [id number] Arrival date & time: 11/15/20  6759     History No chief complaint on file.   Ruth Delgado is a 86 y.o. female.  Ruth Delgado is a 86 y.o. female with a history of hypertension, hyperlipidemia, diabetes, gout, arthritis, CKD, who presents to the ED for evaluation of diarrhea, nausea and vomiting.  Symptoms started suddenly around 2 AM last night and woke her from sleep.  She reports she has had numerous bowel movements and feels like she cannot seem to stay out of the bathroom.  She has not seen any blood in her stools.  She is also had a few episodes of vomiting this morning that has been nonbloody.  Reports cramping abdominal pain associated with the symptoms.  Reports just a few weeks ago she had a bout of diarrhea as well, although this was a bit less severe, was seen by her primary care doctor regarding this and treated with Cipro and Flagyl and after a week of antibiotics diarrhea finally resolved.  She reports this morning after all these episodes of diarrhea she was feeling lightheaded and did fall against a wall hitting her head, she is on anticoagulation, but denies headache, vision changes, numbness weakness.  No other injuries from the fall.  Did not completely lose consciousness.  Also reports that this morning when diarrhea started she started having some palpitations intermittently.  No chest pain or shortness of breath.  No meds prior to arrival.        Past Medical History:  Diagnosis Date  . A-fib ([place])   . Anxiety   . Arthritis   . Diabetes mellitus   . Gout   . Hyperlipemia   . Hypertension     Patient Active Problem List   Diagnosis Date Noted  . CKD stage 2 due to type 2 diabetes mellitus ([place]) 03/06/2018  . Overweight (BMI 25.0-29.9) 03/06/2018  . Osteopenia 03/06/2018  . Esophageal reflux 09/06/2015  . Vitamin D deficiency 06/08/2013  . Medication management  06/08/2013  . Hypertension   . Hyperlipemia   . Gout   . Type 2 diabetes mellitus with stage 3 chronic kidney disease, without long-term current use of insulin ([place])     Past Surgical History:  Procedure Laterality Date  . ABDOMINAL HYSTERECTOMY    . BIOPSY BREAST     (L) BREAST IN 1993  . KNEE ARTHROSCOPY       OB History   No obstetric history on file.     Family History  Problem Relation Age of Onset  . Diabetes Mother   . Hypertension Father   . CVA Father     Social History   Tobacco Use  . Smoking status: Former Smoker    Quit date: 07/30/2006    Years since quitting: 14.3  . Smokeless tobacco: Never Used  Substance Use Topics  . Alcohol use: Yes    Alcohol/week: 2.0 standard drinks    Types: 2 Standard drinks or equivalent per week    Home Medications Prior to Admission medications   Medication Sig Start Date End Date Taking? Authorizing Provider  allopurinol (ZYLOPRIM) 300 MG tablet TAKE 1 TABLET BY MOUTH DAILY TO PREVENT GOUT 12/16/19   Unk Pinto, MD  apixaban (ELIQUIS) 2.5 MG TABS tablet Take by mouth 2 (two) times daily.    [provider]  atenolol (TENORMIN) 50 MG tablet Take  1 tablet  Daily  for  BP 09/23/20   Unk Pinto, MD  cholecalciferol (VITAMIN D) 1000 UNITS tablet Take 5,000 Units by mouth every other day.     [provider]  ciprofloxacin (CIPRO) 250 MG tablet Take  1 tablet  2 x /day  with Food for Infection 10/13/20   Unk Pinto, MD  citalopram (CELEXA) 20 MG tablet TAKE 1 TABLET DAILY FOR MOOD 07/11/20   Unk Pinto, MD  diltiazem (CARDIZEM CD) 240 MG 24 hr capsule TAKE 1 CAPSULE BY MOUTH DAILY FOR BLOOD PRESSURE 03/17/20   Liane Comber, NP  furosemide (LASIX) 40 MG tablet TAKE 1/2 TO 1 TABLET BY MOUTH ONCE DAILY FOR FLUID RETENTION 12/16/19   Unk Pinto, MD  hyoscyamine (LEVSIN SL) 0.125 MG SL tablet Dissolve  1 to 2 tablets  Under tongue  3 to 4 x /day  if needed for Nausea, vomiting, cramping  or diarrhea 10/13/20   Unk Pinto, MD  Lancets (FREESTYLE) lancets  02/10/16   [provider]  lisinopril (ZESTRIL) 20 MG tablet Take  1 tablet  Daily  for BP 09/23/20   Unk Pinto, MD  metroNIDAZOLE (FLAGYL) 250 MG tablet Take  1 tablet  3 x /day  with Food for Infection 10/13/20   Unk Pinto, MD  ondansetron (ZOFRAN ODT) 8 MG disintegrating tablet Dissolve  1 tablet  under tongue  every 6 hours for  nausea  or vomitting 10/13/20   Unk Pinto, MD  pravastatin (PRAVACHOL) 40 MG tablet Take  1 tablet  at Bedtime  for Cholesterol 09/23/20   Unk Pinto, MD  triamcinolone cream (KENALOG) 0.1 % as needed.  10/02/16   [provider]    Allergies    Buspar [buspirone], Lipitor [atorvastatin], Metformin and related, Nsaids, Sulfa antibiotics, and Sulfonamide derivatives  Review of Systems   Review of Systems  Constitutional: Positive for fatigue. Negative for chills and fever.  HENT: Negative.   Respiratory: Negative for cough and shortness of breath.   Cardiovascular: Positive for palpitations. Negative for chest pain.  Gastrointestinal: Positive for abdominal pain, diarrhea, nausea and vomiting. Negative for blood in stool.  Genitourinary: Negative for dysuria and frequency.  Musculoskeletal: Negative for arthralgias and myalgias.  Skin: Negative for color change and rash.  Neurological: Positive for weakness (Generalized) and light-headedness. Negative for dizziness, syncope, numbness and headaches.  All other systems reviewed and are negative.   Physical Exam Updated Vital Signs BP (!) 158/81   Pulse 94   Temp 99.7 F (37.6 C)   Resp 17   SpO2 100%   Physical Exam Vitals and nursing note reviewed.  Constitutional:      General: She is not in acute distress.    Appearance: She is well-developed. She is not diaphoretic.     Comments: Elderly female, alert and pleasant, ill-appearing but no acute distress  HENT:     Head: Normocephalic and  atraumatic.     Mouth/Throat:     Mouth: Mucous membranes are moist.     Pharynx: Oropharynx is clear.  Eyes:     General:        Right eye: No discharge.        Left eye: No discharge.  Cardiovascular:     Rate and Rhythm: Normal rate and regular rhythm.     Pulses: Normal pulses.     Heart sounds: Normal heart sounds. No murmur heard. No friction rub.  Pulmonary:     Effort: Pulmonary effort is normal. No respiratory distress.  Breath sounds: Normal breath sounds. No wheezing or rales.     Comments: Respirations equal and unlabored, patient able to speak in full sentences, lungs clear to auscultation bilaterally Abdominal:     General: Bowel sounds are normal. There is no distension.     Palpations: Abdomen is soft. There is no mass.     Tenderness: There is abdominal tenderness. There is no guarding.     Comments: Abdomen is soft, nondistended, bowel sounds hyperactive, no focal area, no guarding or rebound tenderness.  Musculoskeletal:        General: No deformity.     Cervical back: Neck supple.     Right lower leg: No edema.     Left lower leg: No edema.  Skin:    General: Skin is warm and dry.     Capillary Refill: Capillary refill takes less than 2 seconds.  Neurological:     Mental Status: She is alert and oriented to person, place, and time.     Coordination: Coordination normal.     Comments: Speech is clear, able to follow commands Moves extremities without ataxia, coordination intact  Psychiatric:        Mood and Affect: Mood normal.        Behavior: Behavior normal.     ED Results / Procedures / Treatments   Labs (all labs ordered are listed, but only abnormal results are displayed) Labs Reviewed  COMPREHENSIVE METABOLIC PANEL - Abnormal; Notable for the following components:      Result Value   CO2 19 (*)    Glucose, Bld 187 (*)    Total Protein 6.4 (*)    All other components within normal limits  CBC WITH DIFFERENTIAL/PLATELET - Abnormal; Notable  for the following components:   WBC 10.7 (*)    RBC 3.81 (*)    Platelets 149 (*)    Neutro Abs 9.3 (*)    All other components within normal limits  GASTROINTESTINAL PANEL BY PCR, STOOL (REPLACES STOOL CULTURE)  C DIFFICILE QUICK SCREEN W PCR REFLEX  LIPASE, BLOOD  CBC WITH DIFFERENTIAL/PLATELET  URINALYSIS, ROUTINE W REFLEX MICROSCOPIC    EKG EKG Interpretation  Date/Time:  Tuesday November 15 2020 14:19:26 EDT Ventricular Rate:  140 PR Interval:    QRS Duration: 77 QT Interval:  285 QTC Calculation: 435 R Axis:   76 Text Interpretation: Atrial fibrillation with rapid V-rate Repolarization abnormality, prob rate related Since last tracing now in rapid Atrial fibrillation Confirmed by Daleen Bo [phone]) on 11/15/2020 4:00:48 PM   Radiology CT Head Wo Contrast  Result Date: 11/15/2020 CLINICAL DATA:  Dizziness, fall. EXAM: CT HEAD WITHOUT CONTRAST TECHNIQUE: Contiguous axial images were obtained from the base of the skull through the vertex without intravenous contrast. COMPARISON:  September 07, 2011. FINDINGS: Brain: No evidence of acute infarction, hemorrhage, hydrocephalus, extra-axial collection or mass lesion/mass effect. Vascular: No hyperdense vessel or unexpected calcification. Skull: Normal. Negative for fracture or focal lesion. Sinuses/Orbits: No acute finding. Other: None. IMPRESSION: No acute intracranial abnormality seen. Electronically Signed   By: Marijo Conception M.D.   On: 11/15/2020 12:29   CT ABDOMEN PELVIS W CONTRAST  Result Date: 11/15/2020 CLINICAL DATA:  Dizziness, abdominal pain EXAM: CT ABDOMEN AND PELVIS WITH CONTRAST TECHNIQUE: Multidetector CT imaging of the abdomen and pelvis was performed using the standard protocol following bolus administration of intravenous contrast. CONTRAST:  155mL OMNIPAQUE IOHEXOL 300 MG/ML  SOLN COMPARISON:  None. FINDINGS: Lower chest: Lung bases are essentially clear. Hepatobiliary:  Subcentimeter cyst segment 5 (series  3/image 23). Gallbladder is unremarkable. No intrahepatic or extrahepatic ductal dilatation. Pancreas: Within normal limits. Spleen: Within normal limits. Adrenals/Urinary Tract: Adrenal glands are within normal limits. Kidneys are within normal limits.  No hydronephrosis. Bladder is within normal limits. Stomach/Bowel: Stomach is notable for a moderate hiatal hernia. No evidence of bowel obstruction. Appendix is not discretely visualized. Ascending and transverse colon is decompressed. Sigmoid diverticulosis, without evidence of diverticulitis. Vascular/Lymphatic: No evidence of abdominal aortic aneurysm. Atherosclerotic calcifications of the abdominal aorta and branch vessels. No suspicious abdominopelvic lymphadenopathy. Reproductive: Status post hysterectomy. Left ovary is notable for a 2.0 cm cyst/follicle (series 3/image 62). No right adnexal mass. Other: No abdominopelvic ascites. Musculoskeletal: Mild degenerative changes of the visualized thoracolumbar spine. Grade 1 anterolisthesis of L4 on L5. IMPRESSION: Sigmoid diverticulosis, without evidence of diverticulitis. Status post hysterectomy. No CT findings to account for the patient's abdominal pain. Electronically Signed   By: Julian Hy M.D.   On: 11/15/2020 12:41     Procedures .Critical Care Performed by: Jacqlyn Larsen, PA-C Authorized by: Jacqlyn Larsen, PA-C   Critical care provider statement:    Critical care time (minutes):  45   Critical care was necessary to treat or prevent imminent or life-threatening deterioration of the following conditions:  Cardiac failure (A. fib with RVR requiring Cardizem drip)   Critical care was time spent personally by me on the following activities:  Discussions with consultants, evaluation of patient's response to treatment, examination of patient, ordering and performing treatments and interventions, ordering and review of laboratory studies, ordering and review of radiographic studies, pulse  oximetry, re-evaluation of patient's condition, obtaining history from patient or surrogate and review of old charts     Medications Ordered in ED Medications  ondansetron (ZOFRAN) injection 4 mg (has no administration in time range)  dicyclomine (BENTYL) injection 20 mg (has no administration in time range)  sodium chloride 0.9 % bolus 1,000 mL (1,000 mLs Intravenous New Bag/Given (Non-Interop) 11/15/20 1050)    ED Course  I have reviewed the triage vital signs and the nursing notes.  Pertinent labs & imaging results that were available during my care of the patient were reviewed by me and considered in my medical decision making (see chart for details).    MDM Rules/Calculators/A&P                         86 year old female presents with vomiting and diarrhea that started overnight associated with some generalized abdominal pain, no fevers, did have a similar episode a few weeks ago and was treated with antibiotics.  On arrival vitals are normal, patient is nauseated but not actively vomiting, mild generalized tenderness on exam.  Also reported some palpitations overnight.  Will evaluate with labs, CT abdomen pelvis, and will get GI pathogen panel as well as C. difficile stool studies given recent antibiotic treatment.  IV fluids and symptomatic treatment for nausea and diarrhea given here in the ED.  Given fall on blood thinners we will also get CT head  Additional history obtained from patient's neighbor who accompanied her to the ED.  I have also reviewed patient's electronic medical record  I have independently ordered, reviewed and interpreted all labs and imaging:  CBC: Minimal leukocytosis, normal hemoglobin CMP: Despite significant diarrhea patient fortunately has normal potassium, CO2 of 19 and glucose of 187, no other electrolyte derangements, normal renal and liver function Lipase: WNL   Stool studies  pending  CT head is unremarkable, no bleeding or other acute intracranial  abnormality  CT abdomen pelvis overall reassuring, no acute abnormalities noted, diverticuli but no evidence of diverticulitis  Called to room by nursing staff as after getting up to use the bathroom multiple times patient became very tachycardic with heart rate in the 140s-150s on closer examination it appears patient is in A. fib with RVR.  She reports she was told she had A. fib once previously but her primary care doctor did not think so, she has not followed with a cardiologist.  Suspect this was likely what was causing her palpitations overnight as well.  Patient persistently with heart rate greater than 140.  Will start on Cardizem load and infusion  Given diarrheal illness with continued numerous bowel movements and vomiting requiring multiple antiemetics now with A. fib RVR as well patient will require hospital admission for better control of heart rate continued hydration and further evaluation of persistent diarrhea and vomiting  Case discussed with Dr. Lorin Mercy with [facility] hospitalist will see and admit the patient.  Final Clinical Impression(s) / ED Diagnoses Final diagnoses:  Atrial fibrillation with RVR ([facility])  Nausea vomiting and diarrhea    Rx / DC Orders ED Discharge Orders    None       Janet Berlin 11/19/20 0211    Daleen Bo, MD 11/19/20 [phone]

## 2020-11-15 NOTE — ED Notes (Addendum)
Pt was prescribed zofran, hyoscyamine for similar condition in mid March. Dx food poisoning. Pt reports stool was checked as well, tests were negative.  Pt reports dizziness and fall this morning.

## 2020-11-15 NOTE — ED Notes (Signed)
Report attempted 

## 2020-11-15 NOTE — H&P (Addendum)
History and Physical    Ruth Delgado OHY:[id number] DOB: [birth year] DOA: 11/15/2020  PCP: Unk Pinto, MD Consultants:  Chalmers Cater - endocrinology Patient coming from:  Home - lives alone; NOK: Daughter, Ruth Delgado, [phone], [phone]  Chief Complaint: n/v/d  HPI: Ruth Delgado is a 86 y.o. female with medical history significant of HTN; HLD; DM; and afib presenting with n/v/d.  She had similar symptoms on 3/17 and was treated with Cipro; GI panel was negative at that time. On 3/17, she had a spell just like this - so sick.  She went to her PCP and had a stool sample and was started on Cipro/Flagyl with improvement.  She was fine a few days later.  Last night after dinner, she sat down and felt nauseated - similar to last experience.  She woke up at 2AM with n/v/d.  She drank sips of gatorade overnight.  This AM, she was lightheaded on standing and fell into the wall.  She called her PCP office and was told to come to the ER.  Vomiting x 5-6, dry heaves here in the ER as well but resolved after medication.  Diarrhea with ongoing stool upon arrival but not in the last 2 hours or so.  She occasionally feels chest tightening upon awakening but it improves with walking around.  She saw a PA a year ago and was diagnosed with afib; her PCP told her she didn't have it and canceled her cardiology appt.  No chest discomfort today.    ED Course:  N/V/D since 0200.  Similar episode 3 weeks ago, treated with Cipro/Flagyl and resolved.  Symptoms here and then converted to afib with RVR.  Started on Cardizem drip.  CT unremarkable.  C diff and GI pathogen panel pending.  Ongoing emesis.  Head CT negative.  Review of Systems: As per HPI; otherwise review of systems reviewed and negative.   Ambulatory Status:  Ambulates without assistance  COVID Vaccine Status:   Complete plus booster  Past Medical History:  Diagnosis Date  . A-fib ([place])   . Anxiety   . Arthritis   . Diabetes mellitus    . Gout   . Hyperlipemia   . Hypertension     Past Surgical History:  Procedure Laterality Date  . ABDOMINAL HYSTERECTOMY    . BIOPSY BREAST     (L) BREAST IN 1993  . KNEE ARTHROSCOPY      Social History   Socioeconomic History  . Marital status: Married    Spouse name: Not on file  . Number of children: Not on file  . Years of education: Not on file  . Highest education level: Not on file  Occupational History  . Occupation: retired  Tobacco Use  . Smoking status: Former Smoker    Packs/day: 0.50    Years: 25.00    Pack years: 12.50    Quit date: 07/30/2006    Years since quitting: 14.3  . Smokeless tobacco: Never Used  Substance and Sexual Activity  . Alcohol use: Yes    Alcohol/week: 2.0 standard drinks    Types: 2 Standard drinks or equivalent per week    Comment: occasional, none in 3 months  . Drug use: Never  . Sexual activity: Not on file  Other Topics Concern  . Not on file  Social History Narrative  . Not on file   Social Determinants of Health   Financial Resource Strain: Not on file  Food Insecurity: Not on file  Transportation Needs:  Not on file  Physical Activity: Not on file  Stress: Not on file  Social Connections: Not on file  Intimate Partner Violence: Not on file    Allergies  Allergen Reactions  . Buspar [Buspirone]     dysphoria  . Lipitor [Atorvastatin]     Myalgias  . Metformin And Related     Gas/bloating  . Nsaids     GI upset  . Sulfa Antibiotics Other (See Comments)    Reaction=throat itching  . Sulfonamide Derivatives     Family History  Problem Relation Age of Onset  . Diabetes Mother        suicide at age 25  . Hypertension Father   . CVA Father     Prior to Admission medications   Medication Sig Start Date End Date Taking? Authorizing Provider  allopurinol (ZYLOPRIM) 300 MG tablet TAKE 1 TABLET BY MOUTH DAILY TO PREVENT GOUT 12/16/19   Unk Pinto, MD  apixaban (ELIQUIS) 2.5 MG TABS tablet Take by mouth  2 (two) times daily.    [provider]  atenolol (TENORMIN) 50 MG tablet Take  1 tablet  Daily  for BP 09/23/20   Unk Pinto, MD  cholecalciferol (VITAMIN D) 1000 UNITS tablet Take 5,000 Units by mouth every other day.     [provider]  ciprofloxacin (CIPRO) 250 MG tablet Take  1 tablet  2 x /day  with Food for Infection 10/13/20   Unk Pinto, MD  citalopram (CELEXA) 20 MG tablet TAKE 1 TABLET DAILY FOR MOOD 07/11/20   Unk Pinto, MD  diltiazem (CARDIZEM CD) 240 MG 24 hr capsule TAKE 1 CAPSULE BY MOUTH DAILY FOR BLOOD PRESSURE 03/17/20   Liane Comber, NP  furosemide (LASIX) 40 MG tablet TAKE 1/2 TO 1 TABLET BY MOUTH ONCE DAILY FOR FLUID RETENTION 12/16/19   Unk Pinto, MD  hyoscyamine (LEVSIN SL) 0.125 MG SL tablet Dissolve  1 to 2 tablets  Under tongue  3 to 4 x /day  if needed for Nausea, vomiting, cramping or diarrhea 10/13/20   Unk Pinto, MD  Lancets (FREESTYLE) lancets  02/10/16   [provider]  lisinopril (ZESTRIL) 20 MG tablet Take  1 tablet  Daily  for BP 09/23/20   Unk Pinto, MD  metroNIDAZOLE (FLAGYL) 250 MG tablet Take  1 tablet  3 x /day  with Food for Infection 10/13/20   Unk Pinto, MD  ondansetron (ZOFRAN ODT) 8 MG disintegrating tablet Dissolve  1 tablet  under tongue  every 6 hours for  nausea  or vomitting 10/13/20   Unk Pinto, MD  pravastatin (PRAVACHOL) 40 MG tablet Take  1 tablet  at Bedtime  for Cholesterol 09/23/20   Unk Pinto, MD  triamcinolone cream (KENALOG) 0.1 % as needed.  10/02/16   [provider]    Physical Exam: Vitals:   11/15/20 1545 11/15/20 1615 11/15/20 1616 11/15/20 1646  BP: (!) 129/56 121/63  119/62  Pulse: 83  81 81  Resp: 16  17 20   Temp:    98.4 F (36.9 C)  TempSrc:    Oral  SpO2: 94%  98% 97%  Weight:    70.3 kg  Height:    5\' 5"  (1.651 m)     . General:  Appears calm and comfortable and is in NAD . Eyes:  PERRL, EOMI, normal lids, iris . ENT:   grossly normal hearing, lips & tongue, mmm . Neck:  no LAD, masses or thyromegaly . Cardiovascular:  RRR, no  m/r/g. 1+ LE edema.  Marland Kitchen Respiratory:   CTA bilaterally with no wheezes/rales/rhonchi.  Normal respiratory effort. . Abdomen:  soft, NT, ND, NABS . Skin:  no rash or induration seen on limited exam, compression stockings in place . Musculoskeletal:  grossly normal tone BUE/BLE, good ROM, no bony abnormality . Psychiatric:  grossly normal mood and affect, speech fluent and appropriate, AOx3 . Neurologic:  CN 2-12 grossly intact, moves all extremities in coordinated fashion    Radiological Exams on Admission: Independently reviewed - see discussion in A/P where applicable  CT Head Wo Contrast  Result Date: 11/15/2020 CLINICAL DATA:  Dizziness, fall. EXAM: CT HEAD WITHOUT CONTRAST TECHNIQUE: Contiguous axial images were obtained from the base of the skull through the vertex without intravenous contrast. COMPARISON:  September 07, 2011. FINDINGS: Brain: No evidence of acute infarction, hemorrhage, hydrocephalus, extra-axial collection or mass lesion/mass effect. Vascular: No hyperdense vessel or unexpected calcification. Skull: Normal. Negative for fracture or focal lesion. Sinuses/Orbits: No acute finding. Other: None. IMPRESSION: No acute intracranial abnormality seen. Electronically Signed   By: Marijo Conception M.D.   On: 11/15/2020 12:29   CT ABDOMEN PELVIS W CONTRAST  Result Date: 11/15/2020 CLINICAL DATA:  Dizziness, abdominal pain EXAM: CT ABDOMEN AND PELVIS WITH CONTRAST TECHNIQUE: Multidetector CT imaging of the abdomen and pelvis was performed using the standard protocol following bolus administration of intravenous contrast. CONTRAST:  153mL OMNIPAQUE IOHEXOL 300 MG/ML  SOLN COMPARISON:  None. FINDINGS: Lower chest: Lung bases are essentially clear. Hepatobiliary: Subcentimeter cyst segment 5 (series 3/image 23). Gallbladder is unremarkable. No intrahepatic or extrahepatic ductal  dilatation. Pancreas: Within normal limits. Spleen: Within normal limits. Adrenals/Urinary Tract: Adrenal glands are within normal limits. Kidneys are within normal limits.  No hydronephrosis. Bladder is within normal limits. Stomach/Bowel: Stomach is notable for a moderate hiatal hernia. No evidence of bowel obstruction. Appendix is not discretely visualized. Ascending and transverse colon is decompressed. Sigmoid diverticulosis, without evidence of diverticulitis. Vascular/Lymphatic: No evidence of abdominal aortic aneurysm. Atherosclerotic calcifications of the abdominal aorta and branch vessels. No suspicious abdominopelvic lymphadenopathy. Reproductive: Status post hysterectomy. Left ovary is notable for a 2.0 cm cyst/follicle (series 3/image 62). No right adnexal mass. Other: No abdominopelvic ascites. Musculoskeletal: Mild degenerative changes of the visualized thoracolumbar spine. Grade 1 anterolisthesis of L4 on L5. IMPRESSION: Sigmoid diverticulosis, without evidence of diverticulitis. Status post hysterectomy. No CT findings to account for the patient's abdominal pain. Electronically Signed   By: Julian Hy M.D.   On: 11/15/2020 12:41    EKG: Independently reviewed.   1052 - NSR with rate 87; no evidence of acute ischemia 1419 - Afib with rate 140; ST changes that appear to be rate-related   Labs on Admission: I have personally reviewed the available labs and imaging studies at the time of the admission.  Pertinent labs:   WBC 10.7 Glucose 187 GI panel on 3/23 negative   Assessment/Plan Principal Problem:   Atrial fibrillation with RVR (HCC) Active Problems:   Hypertension   Hyperlipemia   Type 2 diabetes mellitus with stage 3 chronic kidney disease, without long-term current use of insulin (HCC)   Nausea, vomiting and diarrhea   Afib with RVR  -Patient presenting with acute GI illness, but went into afib with RVR in the ER -She had a similar episode 1 month ago and this  begs the question of whether the afib is leading to diminished GI circulation and causing her symptoms; or whether the current GI symptoms led to the  current afib -She does report h/o afib and PA McClanahan noted this in 10/2019 but there is no EKG available from that time; the patient reports that Dr. Melford Aase subsequently told her that she does not have afib -Regardless of whether it was present before, it is present now. -Since the afib onset is unknown, will focus on rate control  -Will plan to place in observation status in SDU for Diltiazem drip as per protocol with plan to transition to PO Diltiazem once heart rate is controlled (resting HR 110 or lower). She has already converted to NSR and so can likely transition back to her PO Dilt at this time. -Heart rate is now well controlled. -CHA2DS2-VASc Score is >2 and so patient would benefit from oral anticoagulation. There is evidence of net benefit even in the extremely elderly population. -Continue home Eliquis. -Since she converted spontaneously and is already on both Dilt and Eliquis at home - do not feel strongly that she needs inpatient cardiology consultation; if not done as inpatient should be referred for outpatient f/u.  N/V/D -Her symptoms have resolved in the ER; while this may be related to symptomatic treatment in the ER, it also may be indicative of afib with RVR causing transient ischemia as the cause of her symptoms -Will observe overnight as above -GI pathogen panel previously negative last month; she was treated with Cipro/Flagyl -Recheck panel and C diff ordered by EDP and are pending  HTN -Resume home Cardizem when ready to transition off drip -Hold Atenolol and Lisinopril for now, may be ready to resume in AM  HLD -Continue Pravachol  DM -Last A1c was 6.6 on 09/02/20 -She is not on home medications -Will not plan to start medications at this time    Note: This patient has been tested and is negative for the novel  coronavirus COVID-19. She has been fully vaccinated against COVID-19.    DVT prophylaxis: Eliquis  Code Status:  DNR - confirmed with patient/family Family Communication: Family friend was present throughout evaluation Disposition Plan:  The patient is from: home  Anticipated d/c is to: home without [facility] services   Anticipated d/c date will depend on clinical response to treatment, but possibly as early as tomorrow if she has excellent response to treatment  Patient is currently: acutely ill Consults called: None  Admission status: It is my clinical opinion that referral for OBSERVATION is reasonable and necessary in this patient based on the above information provided. The aforementioned taken together are felt to place the patient at high risk for further clinical deterioration. However it is anticipated that the patient may be medically stable for discharge from the hospital within 24 to 48 hours.     Karmen Bongo MD [facility]   How to contact the [facility] Attending or Consulting provider [place] or covering provider during after hours [place], for this patient?  1. Check the care team in [facility] and look for a) attending/consulting [facility] provider listed and b) the [facility] team listed 2. Log into [email or web address] and use [place]'s universal password to access. If you do not have the password, please contact the hospital operator. 3. Locate the [facility] provider you are looking for under [facility] and page to a number that you can be directly reached. 4. If you still have difficulty reaching the provider, please page the [facility] (Director on Call) for the Hospitalists listed on amion for assistance.   11/15/2020, 6:26 PM

## 2020-11-16 DIAGNOSIS — F419 Anxiety disorder, unspecified: Secondary | ICD-10-CM | POA: Diagnosis present

## 2020-11-16 DIAGNOSIS — A020 Salmonella enteritis: Secondary | ICD-10-CM | POA: Diagnosis present

## 2020-11-16 DIAGNOSIS — N183 Chronic kidney disease, stage 3 unspecified: Secondary | ICD-10-CM | POA: Diagnosis present

## 2020-11-16 DIAGNOSIS — E876 Hypokalemia: Secondary | ICD-10-CM | POA: Diagnosis present

## 2020-11-16 DIAGNOSIS — E1122 Type 2 diabetes mellitus with diabetic chronic kidney disease: Secondary | ICD-10-CM | POA: Diagnosis present

## 2020-11-16 DIAGNOSIS — Z886 Allergy status to analgesic agent status: Secondary | ICD-10-CM | POA: Diagnosis not present

## 2020-11-16 DIAGNOSIS — M1990 Unspecified osteoarthritis, unspecified site: Secondary | ICD-10-CM | POA: Diagnosis present

## 2020-11-16 DIAGNOSIS — Z79899 Other long term (current) drug therapy: Secondary | ICD-10-CM | POA: Diagnosis not present

## 2020-11-16 DIAGNOSIS — Z8249 Family history of ischemic heart disease and other diseases of the circulatory system: Secondary | ICD-10-CM | POA: Diagnosis not present

## 2020-11-16 DIAGNOSIS — I129 Hypertensive chronic kidney disease with stage 1 through stage 4 chronic kidney disease, or unspecified chronic kidney disease: Secondary | ICD-10-CM | POA: Diagnosis present

## 2020-11-16 DIAGNOSIS — Z90710 Acquired absence of both cervix and uterus: Secondary | ICD-10-CM | POA: Diagnosis not present

## 2020-11-16 DIAGNOSIS — E1165 Type 2 diabetes mellitus with hyperglycemia: Secondary | ICD-10-CM | POA: Diagnosis present

## 2020-11-16 DIAGNOSIS — A0472 Enterocolitis due to Clostridium difficile, not specified as recurrent: Secondary | ICD-10-CM | POA: Diagnosis present

## 2020-11-16 DIAGNOSIS — Z20822 Contact with and (suspected) exposure to covid-19: Secondary | ICD-10-CM | POA: Diagnosis present

## 2020-11-16 DIAGNOSIS — F32A Depression, unspecified: Secondary | ICD-10-CM | POA: Diagnosis present

## 2020-11-16 DIAGNOSIS — K219 Gastro-esophageal reflux disease without esophagitis: Secondary | ICD-10-CM | POA: Diagnosis present

## 2020-11-16 DIAGNOSIS — Z833 Family history of diabetes mellitus: Secondary | ICD-10-CM | POA: Diagnosis not present

## 2020-11-16 DIAGNOSIS — E785 Hyperlipidemia, unspecified: Secondary | ICD-10-CM | POA: Diagnosis present

## 2020-11-16 DIAGNOSIS — Z7901 Long term (current) use of anticoagulants: Secondary | ICD-10-CM | POA: Diagnosis not present

## 2020-11-16 DIAGNOSIS — I4891 Unspecified atrial fibrillation: Secondary | ICD-10-CM | POA: Diagnosis not present

## 2020-11-16 DIAGNOSIS — Z66 Do not resuscitate: Secondary | ICD-10-CM | POA: Diagnosis present

## 2020-11-16 DIAGNOSIS — Z87891 Personal history of nicotine dependence: Secondary | ICD-10-CM | POA: Diagnosis not present

## 2020-11-16 DIAGNOSIS — I480 Paroxysmal atrial fibrillation: Secondary | ICD-10-CM | POA: Diagnosis present

## 2020-11-16 DIAGNOSIS — M109 Gout, unspecified: Secondary | ICD-10-CM | POA: Diagnosis present

## 2020-11-16 DIAGNOSIS — A082 Adenoviral enteritis: Secondary | ICD-10-CM | POA: Diagnosis present

## 2020-11-16 LAB — GASTROINTESTINAL PANEL BY PCR, STOOL (REPLACES STOOL CULTURE)
Adenovirus F40/41: DETECTED — AB
Astrovirus: NOT DETECTED
Campylobacter species: NOT DETECTED
Cryptosporidium: NOT DETECTED
Cyclospora cayetanensis: NOT DETECTED
Entamoeba histolytica: NOT DETECTED
Enteroaggregative E coli (EAEC): NOT DETECTED
Enteropathogenic E coli (EPEC): NOT DETECTED
Enterotoxigenic E coli (ETEC): NOT DETECTED
Giardia lamblia: NOT DETECTED
Norovirus GI/GII: NOT DETECTED
Plesimonas shigelloides: NOT DETECTED
Rotavirus A: NOT DETECTED
Salmonella species: DETECTED — AB
Sapovirus (I, II, IV, and V): NOT DETECTED
Shiga like toxin producing E coli (STEC): NOT DETECTED
Shigella/Enteroinvasive E coli (EIEC): NOT DETECTED
Vibrio cholerae: NOT DETECTED
Vibrio species: NOT DETECTED
Yersinia enterocolitica: NOT DETECTED

## 2020-11-16 MED ORDER — SODIUM CHLORIDE 0.9 % IV SOLN: INTRAVENOUS | Status: DC

## 2020-11-16 MED ORDER — SACCHAROMYCES BOULARDII 250 MG PO CAPS
250.0000 mg | ORAL_CAPSULE | Freq: Two times a day (BID) | ORAL | Status: DC
  Administered 2020-11-16 – 2020-11-18 (×4): 250 mg via ORAL
  Filled 2020-11-16 (×5): qty 1

## 2020-11-16 MED ORDER — CIPROFLOXACIN HCL 500 MG PO TABS
500.0000 mg | ORAL_TABLET | Freq: Two times a day (BID) | ORAL | Status: DC
  Administered 2020-11-16 – 2020-11-18 (×4): 500 mg via ORAL
  Filled 2020-11-16 (×5): qty 1

## 2020-11-16 MED ORDER — APIXABAN 5 MG PO TABS
5.0000 mg | ORAL_TABLET | Freq: Two times a day (BID) | ORAL | Status: DC
  Administered 2020-11-16 – 2020-11-18 (×4): 5 mg via ORAL
  Filled 2020-11-16 (×4): qty 1

## 2020-11-16 MED ORDER — METOPROLOL TARTRATE 5 MG/5ML IV SOLN: 2.5000 mg | INTRAVENOUS | Status: DC | PRN

## 2020-11-16 MED ORDER — METOPROLOL TARTRATE 12.5 MG HALF TABLET
12.5000 mg | ORAL_TABLET | Freq: Two times a day (BID) | ORAL | Status: DC
  Administered 2020-11-16 – 2020-11-18 (×4): 12.5 mg via ORAL
  Filled 2020-11-16 (×5): qty 1

## 2020-11-16 NOTE — Progress Notes (Signed)
   11/16/20 1402  Assess: MEWS Score  Temp 98.4 F (36.9 C)  BP 129/61  Pulse Rate 81  ECG Heart Rate (!) 124  Resp 18  Level of Consciousness Alert  SpO2 98 %  O2 Device Room Air  Assess: MEWS Score  MEWS Temp 0 (Simultaneous filing. User may not have seen previous data.)  MEWS Systolic 0 (Simultaneous filing. User may not have seen previous data.)  MEWS Pulse 2 (Simultaneous filing. User may not have seen previous data.)  MEWS RR 0 (Simultaneous filing. User may not have seen previous data.)  MEWS LOC 0 (Simultaneous filing. User may not have seen previous data.)  MEWS Score 2 (Simultaneous filing. User may not have seen previous data.)  MEWS Score Color Yellow (Simultaneous filing. User may not have seen previous data.)  Assess: if the MEWS score is Yellow or Red  Were vital signs taken at a resting state? Yes  Focused Assessment Change from prior assessment (see assessment flowsheet)  Early Detection of Sepsis Score *See Row Information* Low  MEWS guidelines implemented *See Row Information* Yes  Treat  MEWS Interventions Escalated (See documentation below)  Take Vital Signs  Increase Vital Sign Frequency  Yellow: Q 2hr X 2 then Q 4hr X 2, if remains yellow, continue Q 4hrs  Escalate  MEWS: Escalate Yellow: discuss with charge nurse/RN and consider discussing with provider and RRT  Notify: Charge Nurse/RN  Name of Charge Nurse/RN Notified Abigail, RN  Date Charge Nurse/RN Notified 11/16/20  Time Charge Nurse/RN Notified 1511  Document  Patient Outcome Other (Comment) (stabel in room)  Progress note created (see row info) Yes

## 2020-11-16 NOTE — Progress Notes (Deleted)
   11/16/20 1402  Assess: MEWS Score  Temp 98.4 F (36.9 C)  BP 129/61  Pulse Rate 81  ECG Heart Rate (!) 124  Resp 18  SpO2 98 %  O2 Device Room Air  Assess: MEWS Score  MEWS Temp 0  MEWS Systolic 0  MEWS Pulse 2  MEWS RR 0  MEWS LOC 0  MEWS Score 2  MEWS Score Color Yellow  Assess: if the MEWS score is Yellow or Red  Were vital signs taken at a resting state? Yes  Focused Assessment Change from prior assessment (see assessment flowsheet)  Early Detection of Sepsis Score *See Row Information* Low  MEWS guidelines implemented *See Row Information* Yes  Take Vital Signs  Increase Vital Sign Frequency  Yellow: Q 2hr X 2 then Q 4hr X 2, if remains yellow, continue Q 4hrs  Escalate  MEWS: Escalate Yellow: discuss with charge nurse/RN and consider discussing with provider and RRT  Notify: Charge Nurse/RN  Name of Charge Nurse/RN Notified Abigail, RN  Date Charge Nurse/RN Notified 11/16/20  Time Charge Nurse/RN Notified 1511  Document  Patient Outcome Other (Comment) (stabel in room)  Progress note created (see row info) Yes

## 2020-11-16 NOTE — Progress Notes (Addendum)
PROGRESS NOTE  Ruth Delgado VZD:[id number] DOB: 23-Sep-1934 DOA: 11/15/2020 PCP: Unk Pinto, MD  HPI/Recap of past 24 hours: Ruth Delgado is a 86 y.o. female with medical history significant of HTN; HLD; DM2; and paroxysmal afib presenting with sudden onset nausea vomiting and diarrhea after dinner on 11/14/2020.  She had similar symptoms on 10/13/20; GI panel was negative at that time.  She went to her PCP and had a stool sample and was started on Cipro/Flagyl with improvement.  Symptoms persist while in the hospital.  While in the ED she converted to A. fib with RVR and was started on Cardizem drip.  CT abdomen and pelvis was unrevealing.  Stool studies positive for C. difficile antigen.  GI panel is positive for Salmonella and adenovirus.  Heart rate is currently controlled, she is back on her p.o. Cardizem, will DC Cardizem drip.  She is on Eliquis for CVA prevention.  11/16/20: Patient was seen and examined at her bedside.  She reports persistent nausea and diarrhea x3 since waking up this morning.  Her abdomen is mildly distended but nontender with palpation.   Assessment/Plan: Principal Problem:   Atrial fibrillation with RVR (HCC) Active Problems:   Hypertension   Hyperlipemia   Type 2 diabetes mellitus with stage 3 chronic kidney disease, without long-term current use of insulin (HCC)   Nausea, vomiting and diarrhea  Acute intractable nausea vomiting and diarrhea, infective. Stool study positive for Salmonella, adenovirus. Continue supportive care with IV fluids and IV antiemetics. Monitor electrolytes and volume status. Oral intake as tolerated.  Newly diagnosed Salmonella gastroenteritis Presented with acute diarrhea of 1 day duration prior to presentation Diarrhea is persistent Due to persistent symptomatology and advanced age we will treat with oral antibiotics ciprofloxacin 500 mg twice daily x7 days Add Florastor 250 mg twice daily Continue supportive care  with IV fluid hydration and electrolytes monitoring and replacement.  Type 2 diabetes with hyperglycemia Obtain hemoglobin A1c Insulin sign scale  Paroxysmal A. fib with RVR Cardizem drip DC'd on 11/16/2020 as her rate is currently controlled. Continue oral Cardizem to 40 mg daily. Continue Eliquis for CVA prevention.  Chronic anxiety/depression Stable Resume home Celexa.  Hyperlipidemia Resume home paroxetine.  Gout Stable Resume home allopurinol.  Code Status: DNR.  Family Communication: None at bedside.  Disposition Plan: Likely will discharge to home on 11/17/2020 once able to tolerate an oral intake.   Consultants:  None.  Procedures:  None.  Antimicrobials:  None.  DVT prophylaxis: Eliquis.  Status is: Observation   Dispo:  Patient From: Home  Planned Disposition: Home likely on 11/17/2020 oh when able to tolerate oral intake.  Medically stable for discharge: No, ongoing management of intractable nausea vomiting and diarrhea       Objective: Vitals:   11/15/20 1616 11/15/20 1646 11/15/20 2020 11/16/20 0655  BP:  119/62 (!) 149/60 (!) 122/59  Pulse: 81 81 90 86  Resp: 17 20 20    Temp:  98.4 F (36.9 C) 98.5 F (36.9 C) 98.4 F (36.9 C)  TempSrc:  Oral Oral Oral  SpO2: 98% 97% 94%   Weight:  70.3 kg  67.9 kg  Height:  5\' 5"  (1.651 m)      Intake/Output Summary (Last 24 hours) at 11/16/2020 1314 Last data filed at 11/15/2020 1727 Gross per 24 hour  Intake 2031.5 ml  Output --  Net 2031.5 ml   Filed Weights   11/15/20 1646 11/16/20 0655  Weight: 70.3 kg 67.9 kg  Exam:  . General: 86 y.o. year-old female well developed well nourished in no acute distress.  Alert and oriented x3. . Cardiovascular: Regular rate and rhythm with no rubs or gallops.  No thyromegaly or JVD noted.   Marland Kitchen Respiratory: Clear to auscultation with no wheezes or rales. Good inspiratory effort. . Abdomen: Mildly distended.  Nontender with palpation with normal  bowel sounds x4 quadrants. . Musculoskeletal: No lower extremity edema. 2/4 pulses in all 4 extremities. . Skin: No ulcerative lesions noted or rashes, . Psychiatry: Mood is appropriate for condition and setting   Data Reviewed: CBC: Recent Labs  Lab 11/15/20 1100  WBC 10.7*  NEUTROABS 9.3*  HGB 12.7  HCT 37.9  MCV 99.5  PLT 275*   Basic Metabolic Panel: Recent Labs  Lab 11/15/20 1015  NA 138  K 3.7  CL 106  CO2 19*  GLUCOSE 187*  BUN 15  CREATININE 0.84  CALCIUM 9.2   GFR: Estimated Creatinine Clearance: 45.7 mL/min (by C-G formula based on SCr of 0.84 mg/dL). Liver Function Tests: Recent Labs  Lab 11/15/20 1015  AST 19  ALT 14  ALKPHOS 38  BILITOT 0.9  PROT 6.4*  ALBUMIN 3.7   Recent Labs  Lab 11/15/20 1015  LIPASE 24   No results for input(s): AMMONIA in the last 168 hours. Coagulation Profile: No results for input(s): INR, PROTIME in the last 168 hours. Cardiac Enzymes: No results for input(s): CKTOTAL, CKMB, CKMBINDEX, TROPONINI in the last 168 hours. BNP (last 3 results) No results for input(s): PROBNP in the last 8760 hours. HbA1C: No results for input(s): HGBA1C in the last 72 hours. CBG: No results for input(s): GLUCAP in the last 168 hours. Lipid Profile: No results for input(s): CHOL, HDL, LDLCALC, TRIG, CHOLHDL, LDLDIRECT in the last 72 hours. Thyroid Function Tests: No results for input(s): TSH, T4TOTAL, FREET4, T3FREE, THYROIDAB in the last 72 hours. Anemia Panel: No results for input(s): VITAMINB12, FOLATE, FERRITIN, TIBC, IRON, RETICCTPCT in the last 72 hours. Urine analysis:    Component Value Date/Time   COLORURINE YELLOW 02/11/2020 1033   APPEARANCEUR CLEAR 02/11/2020 1033   LABSPEC 1.020 02/11/2020 1033   PHURINE 8.0 02/11/2020 1033   GLUCOSEU NEGATIVE 02/11/2020 1033   HGBUR NEGATIVE 02/11/2020 1033   BILIRUBINUR NEGATIVE 10/11/2016 1547   KETONESUR NEGATIVE 02/11/2020 1033   PROTEINUR NEGATIVE 02/11/2020 1033    UROBILINOGEN 0.2 09/07/2011 1257   NITRITE NEGATIVE 02/11/2020 1033   LEUKOCYTESUR NEGATIVE 02/11/2020 1033   Sepsis Labs: @LABRCNTIP (procalcitonin:4,lacticidven:4)  ) Recent Results (from the past 240 hour(s))  Gastrointestinal Panel by PCR , Stool     Status: Abnormal   Collection Time: 11/15/20  2:20 PM   Specimen: Stool  Result Value Ref Range Status   Campylobacter species NOT DETECTED NOT DETECTED Final   Plesimonas shigelloides NOT DETECTED NOT DETECTED Final   Salmonella species DETECTED (A) NOT DETECTED Final    Comment: RESULT CALLED TO, READ BACK BY AND VERIFIED WITH:  [facility] AT 1153 11/16/20 SDR    Yersinia enterocolitica NOT DETECTED NOT DETECTED Final   Vibrio species NOT DETECTED NOT DETECTED Final   Vibrio cholerae NOT DETECTED NOT DETECTED Final   Enteroaggregative E coli (EAEC) NOT DETECTED NOT DETECTED Final   Enteropathogenic E coli (EPEC) NOT DETECTED NOT DETECTED Final   Enterotoxigenic E coli (ETEC) NOT DETECTED NOT DETECTED Final   Shiga like toxin producing E coli (STEC) NOT DETECTED NOT DETECTED Final   Shigella/Enteroinvasive E coli (EIEC) NOT DETECTED NOT  DETECTED Final   Cryptosporidium NOT DETECTED NOT DETECTED Final   Cyclospora cayetanensis NOT DETECTED NOT DETECTED Final   Entamoeba histolytica NOT DETECTED NOT DETECTED Final   Giardia lamblia NOT DETECTED NOT DETECTED Final   Adenovirus F40/41 DETECTED (A) NOT DETECTED Final   Astrovirus NOT DETECTED NOT DETECTED Final   Norovirus GI/GII NOT DETECTED NOT DETECTED Final   Rotavirus A NOT DETECTED NOT DETECTED Final   Sapovirus (I, II, IV, and V) NOT DETECTED NOT DETECTED Final    Comment: Performed at [facility], [place], [place] [address]  C Difficile Quick Screen w PCR reflex     Status: Abnormal   Collection Time: 11/15/20  2:20 PM   Specimen: Stool  Result Value Ref Range Status   C Diff antigen POSITIVE (A) NEGATIVE Final   C Diff toxin NEGATIVE NEGATIVE  Final   C Diff interpretation Results are indeterminate. See PCR results.  Final    Comment: Performed at [facility], [place] [address], [place], [place] [address]  C. Diff by PCR, Reflexed     Status: None   Collection Time: 11/15/20  2:20 PM  Result Value Ref Range Status   Toxigenic C. Difficile by PCR NEGATIVE NEGATIVE Final    Comment: Patient is colonized with non toxigenic C. difficile. May not need treatment unless significant symptoms are present. Performed at [facility], [place] [address] [place],  [address]   Resp Panel by RT-PCR (Flu A&B, Covid) Nasopharyngeal Swab     Status: None   Collection Time: 11/15/20  3:35 PM   Specimen: Nasopharyngeal Swab; Nasopharyngeal(NP) swabs in vial transport medium  Result Value Ref Range Status   SARS Coronavirus 2 by RT PCR NEGATIVE NEGATIVE Final    Comment: (NOTE) SARS-CoV-2 target nucleic acids are NOT DETECTED.  The SARS-CoV-2 RNA is generally detectable in upper respiratory specimens during the acute phase of infection. The lowest concentration of SARS-CoV-2 viral copies this assay can detect is 138 copies/mL. A negative result does not preclude SARS-Cov-2 infection and should not be used as the sole basis for treatment or other patient management decisions. A negative result may occur with  improper specimen collection/handling, submission of specimen other than nasopharyngeal swab, presence of viral mutation(s) within the areas targeted by this assay, and inadequate number of viral copies(<138 copies/mL). A negative result must be combined with clinical observations, patient history, and epidemiological information. The expected result is Negative.  Fact Sheet for Patients:  EntrepreneurPulse.com.au  Fact Sheet for Healthcare Providers:  IncredibleEmployment.be  This test is no t yet approved or cleared by the [place] FDA and  has been authorized for detection  and/or diagnosis of SARS-CoV-2 by FDA under an Emergency Use Authorization (EUA). This EUA will remain  in effect (meaning this test can be used) for the duration of the COVID-19 declaration under Section 564(b)(1) of the Act, 21 U.S.C.section 360bbb-3(b)(1), unless the authorization is terminated  or revoked sooner.       Influenza A by PCR NEGATIVE NEGATIVE Final   Influenza B by PCR NEGATIVE NEGATIVE Final    Comment: (NOTE) The Xpert Xpress SARS-CoV-2/FLU/RSV plus assay is intended as an aid in the diagnosis of influenza from Nasopharyngeal swab specimens and should not be used as a sole basis for treatment. Nasal washings and aspirates are unacceptable for Xpert Xpress SARS-CoV-2/FLU/RSV testing.  Fact Sheet for Patients: EntrepreneurPulse.com.au  Fact Sheet for Healthcare Providers: IncredibleEmployment.be  This test is not  yet approved or cleared by the [place] and has been authorized for detection and/or diagnosis of SARS-CoV-2 by FDA under an Emergency Use Authorization (EUA). This EUA will remain in effect (meaning this test can be used) for the duration of the COVID-19 declaration under Section 564(b)(1) of the Act, 21 U.S.C. section 360bbb-3(b)(1), unless the authorization is terminated or revoked.  Performed at [facility], [place] [address] [place], [place] [address]       Studies: No results found.  Scheduled Meds: . allopurinol  300 mg Oral Daily  . apixaban  5 mg Oral BID  . citalopram  20 mg Oral Daily  . diltiazem  240 mg Oral Daily  . pravastatin  40 mg Oral Daily    Continuous Infusions:   LOS: 0 days     Kayleen Memos, MD [facility] Pager [phone]  If 7PM-7AM, please contact night-coverage [email or web address] Password [email or web address] 11/16/2020, 1:14 PM

## 2020-11-17 MED ORDER — ORAL CARE MOUTH RINSE
15.0000 mL | Freq: Two times a day (BID) | OROMUCOSAL | Status: DC
  Administered 2020-11-18: 15 mL via OROMUCOSAL

## 2020-11-17 MED ORDER — ORAL CARE MOUTH RINSE: 15.0000 mL | Freq: Two times a day (BID) | OROMUCOSAL | Status: DC

## 2020-11-17 NOTE — Progress Notes (Signed)
PROGRESS NOTE  Ruth Delgado EYC:[id number] DOB: 12-Dec-1934 DOA: 11/15/2020 PCP: Unk Pinto, MD  HPI/Recap of past 24 hours: Ruth Delgado is a 86 y.o. female with medical history significant of HTN; HLD; DM2; and paroxysmal afib presenting with sudden onset nausea vomiting and diarrhea after dinner on 11/14/2020.  She had similar symptoms on 10/13/20; GI panel was negative at that time.  She went to her PCP and had a stool sample and was started on Cipro/Flagyl with improvement.  Symptoms persist while in the hospital.  While in the ED she converted to A. fib with RVR and was started on Cardizem drip.  CT abdomen and pelvis was unrevealing.  Stool studies positive for C. difficile antigen.  GI panel is positive for Salmonella and adenovirus.  Heart rate is currently controlled, she is back on her p.o. Cardizem, DCed Cardizem drip on 11/16/20.  She is on Eliquis for CVA prevention.  11/17/20: Seen and examined at bedside.  She reports persistent diarrhea.  Intermittent nausea with no vomiting.   Assessment/Plan: Principal Problem:   Atrial fibrillation with RVR (HCC) Active Problems:   Hypertension   Hyperlipemia   Type 2 diabetes mellitus with stage 3 chronic kidney disease, without long-term current use of insulin (HCC)   Nausea, vomiting and diarrhea  Acute intractable nausea vomiting and diarrhea, infective. Stool study positive for Salmonella, adenovirus. Continue supportive care with IV fluids and IV antiemetics. Monitor electrolytes and volume status. Oral intake as tolerated. Advance diet today as tolerated.  Newly diagnosed Salmonella gastroenteritis Presented with acute diarrhea of 1 day duration prior to presentation Diarrhea is persistent Due to persistent symptomatology and advanced age we will treat with oral antibiotics ciprofloxacin 500 mg twice daily x7 days Continue Florastor 250 mg twice daily Continue supportive care with IV fluid hydration and  electrolytes monitoring and replacement.  Type 2 diabetes with hyperglycemia Hemoglobin A1c 6.6 on 09/02/2020. Continue insulin sign scale  Paroxysmal A. fib with RVR Cardizem drip DC'd on 11/16/2020 as her rate is currently controlled. Continue oral Cardizem to 40 mg daily. Continue Eliquis for CVA prevention.  Chronic anxiety/depression Stable Continue home Celexa.  Hyperlipidemia Continue home paroxetine.  Gout Stable Continue home allopurinol.  Code Status: DNR.  Family Communication: None at bedside.  Disposition Plan: Likely will discharge to home on 11/18/2020 once able to tolerate an oral intake.   Consultants:  None.  Procedures:  None.  Antimicrobials:  None.  DVT prophylaxis: Eliquis.  Status is: Observation   Dispo:  Patient From: Home  Planned Disposition: Home likely on 11/18/2020 oh when able to tolerate oral intake.  Medically stable for discharge: No, ongoing management of intractable nausea vomiting and diarrhea       Objective: Vitals:   11/16/20 2259 11/17/20 0643 11/17/20 0826 11/17/20 1449  BP: (!) 127/91 (!) 149/67 134/65 137/63  Pulse: 99 85 70 79  Resp: 19 16 16 16   Temp:  98.5 F (36.9 C) 97.8 F (36.6 C) 98.2 F (36.8 C)  TempSrc:  Oral Oral Oral  SpO2: 98% 95% 98% 99%  Weight:  68.8 kg    Height:        Intake/Output Summary (Last 24 hours) at 11/17/2020 1648 Last data filed at 11/17/2020 0000 Gross per 24 hour  Intake 335.88 ml  Output --  Net 335.88 ml   Filed Weights   11/15/20 1646 11/16/20 0655 11/17/20 0643  Weight: 70.3 kg 67.9 kg 68.8 kg    Exam:  . General:  86 y.o. year-old female frail-appearing no acute distress.  She is alert and noted x3.   . Cardiovascular: Irregular rate and rhythm no rubs or gallops.   Marland Kitchen Respiratory: Clear to auscultation no wheezes or rales.   . Abdomen: Mildly distended nontender bowel sounds present. . Musculoskeletal: No extremity edema bilaterally. . Skin: Ulcerative  lesions noted.  No rashes noted.   Marland Kitchen Psychiatry: Mood is appropriate for condition and setting.   Data Reviewed: CBC: Recent Labs  Lab 11/15/20 1100  WBC 10.7*  NEUTROABS 9.3*  HGB 12.7  HCT 37.9  MCV 99.5  PLT 353*   Basic Metabolic Panel: Recent Labs  Lab 11/15/20 1015  NA 138  K 3.7  CL 106  CO2 19*  GLUCOSE 187*  BUN 15  CREATININE 0.84  CALCIUM 9.2   GFR: Estimated Creatinine Clearance: 49.4 mL/min (by C-G formula based on SCr of 0.84 mg/dL). Liver Function Tests: Recent Labs  Lab 11/15/20 1015  AST 19  ALT 14  ALKPHOS 38  BILITOT 0.9  PROT 6.4*  ALBUMIN 3.7   Recent Labs  Lab 11/15/20 1015  LIPASE 24   No results for input(s): AMMONIA in the last 168 hours. Coagulation Profile: No results for input(s): INR, PROTIME in the last 168 hours. Cardiac Enzymes: No results for input(s): CKTOTAL, CKMB, CKMBINDEX, TROPONINI in the last 168 hours. BNP (last 3 results) No results for input(s): PROBNP in the last 8760 hours. HbA1C: No results for input(s): HGBA1C in the last 72 hours. CBG: No results for input(s): GLUCAP in the last 168 hours. Lipid Profile: No results for input(s): CHOL, HDL, LDLCALC, TRIG, CHOLHDL, LDLDIRECT in the last 72 hours. Thyroid Function Tests: No results for input(s): TSH, T4TOTAL, FREET4, T3FREE, THYROIDAB in the last 72 hours. Anemia Panel: No results for input(s): VITAMINB12, FOLATE, FERRITIN, TIBC, IRON, RETICCTPCT in the last 72 hours. Urine analysis:    Component Value Date/Time   COLORURINE YELLOW 02/11/2020 1033   APPEARANCEUR CLEAR 02/11/2020 1033   LABSPEC 1.020 02/11/2020 1033   PHURINE 8.0 02/11/2020 1033   GLUCOSEU NEGATIVE 02/11/2020 1033   HGBUR NEGATIVE 02/11/2020 1033   BILIRUBINUR NEGATIVE 10/11/2016 1547   KETONESUR NEGATIVE 02/11/2020 1033   PROTEINUR NEGATIVE 02/11/2020 1033   UROBILINOGEN 0.2 09/07/2011 1257   NITRITE NEGATIVE 02/11/2020 1033   LEUKOCYTESUR NEGATIVE 02/11/2020 1033   Sepsis  Labs: @LABRCNTIP (procalcitonin:4,lacticidven:4)  ) Recent Results (from the past 240 hour(s))  Gastrointestinal Panel by PCR , Stool     Status: Abnormal   Collection Time: 11/15/20  2:20 PM   Specimen: Stool  Result Value Ref Range Status   Campylobacter species NOT DETECTED NOT DETECTED Final   Plesimonas shigelloides NOT DETECTED NOT DETECTED Final   Salmonella species DETECTED (A) NOT DETECTED Final    Comment: RESULT CALLED TO, READ BACK BY AND VERIFIED WITH:  [facility] AT 1153 11/16/20 SDR    Yersinia enterocolitica NOT DETECTED NOT DETECTED Final   Vibrio species NOT DETECTED NOT DETECTED Final   Vibrio cholerae NOT DETECTED NOT DETECTED Final   Enteroaggregative E coli (EAEC) NOT DETECTED NOT DETECTED Final   Enteropathogenic E coli (EPEC) NOT DETECTED NOT DETECTED Final   Enterotoxigenic E coli (ETEC) NOT DETECTED NOT DETECTED Final   Shiga like toxin producing E coli (STEC) NOT DETECTED NOT DETECTED Final   Shigella/Enteroinvasive E coli (EIEC) NOT DETECTED NOT DETECTED Final   Cryptosporidium NOT DETECTED NOT DETECTED Final   Cyclospora cayetanensis NOT DETECTED NOT DETECTED Final   Entamoeba  histolytica NOT DETECTED NOT DETECTED Final   Giardia lamblia NOT DETECTED NOT DETECTED Final   Adenovirus F40/41 DETECTED (A) NOT DETECTED Final   Astrovirus NOT DETECTED NOT DETECTED Final   Norovirus GI/GII NOT DETECTED NOT DETECTED Final   Rotavirus A NOT DETECTED NOT DETECTED Final   Sapovirus (I, II, IV, and V) NOT DETECTED NOT DETECTED Final    Comment: Performed at [facility], [address], [place], [place] [address]  C Difficile Quick Screen w PCR reflex     Status: Abnormal   Collection Time: 11/15/20  2:20 PM   Specimen: Stool  Result Value Ref Range Status   C Diff antigen POSITIVE (A) NEGATIVE Final   C Diff toxin NEGATIVE NEGATIVE Final   C Diff interpretation Results are indeterminate. See PCR results.  Final    Comment: Performed at [facility], [place] [address] [place], [place] [address]  C. Diff by PCR, Reflexed     Status: None   Collection Time: 11/15/20  2:20 PM  Result Value Ref Range Status   Toxigenic C. Difficile by PCR NEGATIVE NEGATIVE Final    Comment: Patient is colonized with non toxigenic C. difficile. May not need treatment unless significant symptoms are present. Performed at [facility], [place] [address] [place], [place] [address]   Resp Panel by RT-PCR (Flu A&B, Covid) Nasopharyngeal Swab     Status: None   Collection Time: 11/15/20  3:35 PM   Specimen: Nasopharyngeal Swab; Nasopharyngeal(NP) swabs in vial transport medium  Result Value Ref Range Status   SARS Coronavirus 2 by RT PCR NEGATIVE NEGATIVE Final    Comment: (NOTE) SARS-CoV-2 target nucleic acids are NOT DETECTED.  The SARS-CoV-2 RNA is generally detectable in upper respiratory specimens during the acute phase of infection. The lowest concentration of SARS-CoV-2 viral copies this assay can detect is 138 copies/mL. A negative result does not preclude SARS-Cov-2 infection and should not be used as the sole basis for treatment or other patient management decisions. A negative result may occur with  improper specimen collection/handling, submission of specimen other than nasopharyngeal swab, presence of viral mutation(s) within the areas targeted by this assay, and inadequate number of viral copies(<138 copies/mL). A negative result must be combined with clinical observations, patient history, and epidemiological information. The expected result is Negative.  Fact Sheet for Patients:  EntrepreneurPulse.com.au  Fact Sheet for Healthcare Providers:  IncredibleEmployment.be  This test is no t yet approved or cleared by the [place] FDA and  has been authorized for detection and/or diagnosis of SARS-CoV-2 by FDA under an Emergency Use Authorization (EUA). This EUA will remain  in effect (meaning  this test can be used) for the duration of the COVID-19 declaration under Section 564(b)(1) of the Act, 21 U.S.C.section 360bbb-3(b)(1), unless the authorization is terminated  or revoked sooner.       Influenza A by PCR NEGATIVE NEGATIVE Final   Influenza B by PCR NEGATIVE NEGATIVE Final    Comment: (NOTE) The Xpert Xpress SARS-CoV-2/FLU/RSV plus assay is intended as an aid in the diagnosis of influenza from Nasopharyngeal swab specimens and should not be used as a sole basis for treatment. Nasal washings and aspirates are unacceptable for Xpert Xpress SARS-CoV-2/FLU/RSV testing.  Fact Sheet for Patients: EntrepreneurPulse.com.au  Fact Sheet for Healthcare Providers: IncredibleEmployment.be  This test is not yet approved or cleared by the [place] FDA and has been authorized for detection and/or diagnosis of SARS-CoV-2 by FDA under  an Emergency Use Authorization (EUA). This EUA will remain in effect (meaning this test can be used) for the duration of the COVID-19 declaration under Section 564(b)(1) of the Act, 21 U.S.C. section 360bbb-3(b)(1), unless the authorization is terminated or revoked.  Performed at [facility], [place] [address], [place], [place] [address]       Studies: No results found.  Scheduled Meds: . allopurinol  300 mg Oral Daily  . apixaban  5 mg Oral BID  . ciprofloxacin  500 mg Oral BID  . citalopram  20 mg Oral Daily  . diltiazem  240 mg Oral Daily  . mouth rinse  15 mL Mouth Rinse BID  . metoprolol tartrate  12.5 mg Oral BID  . pravastatin  40 mg Oral Daily  . saccharomyces boulardii  250 mg Oral BID    Continuous Infusions: . sodium chloride 50 mL/hr at 11/16/20 2204     LOS: 1 day     Kayleen Memos, MD [facility] Pager [phone]  If 7PM-7AM, please contact night-coverage [email or web address] Password [facility] 11/17/2020, 4:48 PM

## 2020-11-18 LAB — CBC WITH DIFFERENTIAL/PLATELET
Abs Immature Granulocytes: 0.03 10*3/uL (ref 0.00–0.07)
Basophils Absolute: 0 10*3/uL (ref 0.0–0.1)
Basophils Relative: 1 %
Eosinophils Absolute: 0.1 10*3/uL (ref 0.0–0.5)
Eosinophils Relative: 1 %
HCT: 36.7 % (ref 36.0–46.0)
Hemoglobin: 12.3 g/dL (ref 12.0–15.0)
Immature Granulocytes: 1 %
Lymphocytes Relative: 24 %
Lymphs Abs: 1.2 10*3/uL (ref 0.7–4.0)
MCH: 33 pg (ref 26.0–34.0)
MCHC: 33.5 g/dL (ref 30.0–36.0)
MCV: 98.4 fL (ref 80.0–100.0)
Monocytes Absolute: 0.6 10*3/uL (ref 0.1–1.0)
Monocytes Relative: 12 %
Neutro Abs: 3 10*3/uL (ref 1.7–7.7)
Neutrophils Relative %: 61 %
Platelets: 142 10*3/uL — ABNORMAL LOW (ref 150–400)
RBC: 3.73 MIL/uL — ABNORMAL LOW (ref 3.87–5.11)
RDW: 13.9 % (ref 11.5–15.5)
WBC: 4.9 10*3/uL (ref 4.0–10.5)
nRBC: 0 % (ref 0.0–0.2)

## 2020-11-18 LAB — COMPREHENSIVE METABOLIC PANEL
ALT: 16 U/L (ref 0–44)
AST: 20 U/L (ref 15–41)
Albumin: 2.9 g/dL — ABNORMAL LOW (ref 3.5–5.0)
Alkaline Phosphatase: 34 U/L — ABNORMAL LOW (ref 38–126)
Anion gap: 8 (ref 5–15)
BUN: 10 mg/dL (ref 8–23)
CO2: 24 mmol/L (ref 22–32)
Calcium: 8.5 mg/dL — ABNORMAL LOW (ref 8.9–10.3)
Chloride: 107 mmol/L (ref 98–111)
Creatinine, Ser: 0.79 mg/dL (ref 0.44–1.00)
GFR, Estimated: >60 mL/min (ref >60)
Glucose, Bld: 91 mg/dL (ref 70–99)
Potassium: 2.6 mmol/L — CL (ref 3.5–5.1)
Sodium: 139 mmol/L (ref 135–145)
Total Bilirubin: 0.6 mg/dL (ref 0.3–1.2)
Total Protein: 5.6 g/dL — ABNORMAL LOW (ref 6.5–8.1)

## 2020-11-18 LAB — PHOSPHORUS: Phosphorus: 2.1 mg/dL — ABNORMAL LOW (ref 2.5–4.6)

## 2020-11-18 LAB — POTASSIUM: Potassium: 2.7 mmol/L — CL (ref 3.5–5.1)

## 2020-11-18 LAB — MAGNESIUM: Magnesium: 1.9 mg/dL (ref 1.7–2.4)

## 2020-11-18 MED ORDER — POTASSIUM CHLORIDE CRYS ER 20 MEQ PO TBCR
40.0000 meq | EXTENDED_RELEASE_TABLET | Freq: Two times a day (BID) | ORAL | Status: AC
  Administered 2020-11-18: 40 meq via ORAL
  Filled 2020-11-18: qty 2

## 2020-11-18 MED ORDER — APIXABAN 5 MG PO TABS
5.0000 mg | ORAL_TABLET | Freq: Two times a day (BID) | ORAL | 0 refills | Status: DC
Start: 2020-11-18 — End: 2021-01-05

## 2020-11-18 MED ORDER — SACCHAROMYCES BOULARDII 250 MG PO CAPS: 250.0000 mg | ORAL_CAPSULE | Freq: Two times a day (BID) | ORAL | 0 refills | Status: AC

## 2020-11-18 MED ORDER — POTASSIUM CHLORIDE CRYS ER 20 MEQ PO TBCR
40.0000 meq | EXTENDED_RELEASE_TABLET | Freq: Two times a day (BID) | ORAL | Status: DC
  Administered 2020-11-18: 40 meq via ORAL
  Filled 2020-11-18: qty 2

## 2020-11-18 MED ORDER — CIPROFLOXACIN HCL 500 MG PO TABS: 500.0000 mg | ORAL_TABLET | Freq: Two times a day (BID) | ORAL | 0 refills | Status: AC

## 2020-11-18 MED ORDER — POTASSIUM CHLORIDE CRYS ER 20 MEQ PO TBCR: 20.0000 meq | EXTENDED_RELEASE_TABLET | Freq: Every day | ORAL | 0 refills | Status: DC

## 2020-11-18 NOTE — TOC Progression Note (Signed)
Transition of Care [facility]) - Progression Note    Patient Details  Name: Ruth Delgado MRN: [id number] Date of Birth: 11/28/1934  Transition of Care [facility]) CM/SW Contact  Zenon Mayo, RN Phone Number: 11/18/2020, 12:43 PM  Clinical Narrative:    NCM received consult for PCP that patient requested, NCM scheduled follow up with [facility] on 6/2 at 2:30.        Expected Discharge Plan and Services           Expected Discharge Date: 11/18/20                                     Social Determinants of Health (SDOH) Interventions    Readmission Risk Interventions No flowsheet data found.

## 2020-11-18 NOTE — Discharge Instructions (Signed)
Atrial Fibrillation  Atrial fibrillation is a type of heartbeat that is irregular or fast. If you have this condition, your heart beats without any order. This makes it hard for your heart to pump blood in a normal way. Atrial fibrillation may come and go, or it may become a long-lasting problem. If this condition is not treated, it can put you at higher risk for stroke, heart failure, and other heart problems. What are the causes? This condition may be caused by diseases that damage the heart. They include:  High blood pressure.  Heart failure.  Heart valve disease.  Heart surgery. Other causes include:  Diabetes.  Thyroid disease.  Being overweight.  Kidney disease. Sometimes the cause is not known. What increases the risk? You are more likely to develop this condition if:  You are older.  You smoke.  You exercise often and very hard.  You have a family history of this condition.  You are a man.  You use drugs.  You drink a lot of alcohol.  You have lung conditions, such as emphysema, pneumonia, or COPD.  You have sleep apnea. What are the signs or symptoms? Common symptoms of this condition include:  A feeling that your heart is beating very fast.  Chest pain or discomfort.  Feeling short of breath.  Suddenly feeling light-headed or weak.  Getting tired easily during activity.  Fainting.  Sweating. In some cases, there are no symptoms. How is this treated? Treatment for this condition depends on underlying conditions and how you feel when you have atrial fibrillation. They include:  Medicines to: ? Prevent blood clots. ? Treat heart rate or heart rhythm problems.  Using devices, such as a pacemaker, to correct heart rhythm problems.  Doing surgery to remove the part of the heart that sends bad signals.  Closing an area where clots can form in the heart (left atrial appendage). In some cases, your doctor will treat other underlying  conditions. Follow these instructions at home: Medicines  Take over-the-counter and prescription medicines only as told by your doctor.  Do not take any new medicines without first talking to your doctor.  If you are taking blood thinners: ? Talk with your doctor before you take any medicines that have aspirin or NSAIDs, such as ibuprofen, in them. ? Take your medicine exactly as told by your doctor. Take it at the same time each day. ? Avoid activities that could hurt or bruise you. Follow instructions about how to prevent falls. ? Wear a bracelet that says you are taking blood thinners. Or, carry a card that lists what medicines you take. Lifestyle  Do not use any products that have nicotine or tobacco in them. These include cigarettes, e-cigarettes, and chewing tobacco. If you need help quitting, ask your doctor.  Eat heart-healthy foods. Talk with your doctor about the right eating plan for you.  Exercise regularly as told by your doctor.  Do not drink alcohol.  Lose weight if you are overweight.  Do not use drugs, including cannabis.      General instructions  If you have a condition that causes breathing to stop for a short period of time (apnea), treat it as told by your doctor.  Keep a healthy weight. Do not use diet pills unless your doctor says they are safe for you. Diet pills may make heart problems worse.  Keep all follow-up visits as told by your doctor. This is important. Contact a doctor if:  You notice a   change in the speed, rhythm, or strength of your heartbeat. °· You are taking a blood-thinning medicine and you get more bruising. °· You get tired more easily when you move or exercise. °· You have a sudden change in weight. °Get help right away if: °· You have pain in your chest or your belly (abdomen). °· You have trouble breathing. °· You have side effects of blood thinners, such as blood in your vomit, poop (stool), or pee (urine), or bleeding that cannot  stop. °· You have any signs of a stroke. "BE FAST" is an easy way to remember the main warning signs: °? B - Balance. Signs are dizziness, sudden trouble walking, or loss of balance. °? E - Eyes. Signs are trouble seeing or a change in how you see. °? F - Face. Signs are sudden weakness or loss of feeling in the face, or the face or eyelid drooping on one side. °? A - Arms. Signs are weakness or loss of feeling in an arm. This happens suddenly and usually on one side of the body. °? S - Speech. Signs are sudden trouble speaking, slurred speech, or trouble understanding what people say. °? T - Time. Time to call emergency services. Write down what time symptoms started. °· You have other signs of a stroke, such as: °? A sudden, very bad headache with no known cause. °? Feeling like you may vomit (nausea). °? Vomiting. °? A seizure. °These symptoms may be an emergency. Do not wait to see if the symptoms will go away. Get medical help right away. Call your local emergency services (911 in the [place] Do not drive yourself to the hospital.   °Summary °· Atrial fibrillation is a type of heartbeat that is irregular or fast. °· You are at higher risk of this condition if you smoke, are older, have diabetes, or are overweight. °· Follow your doctor's instructions about medicines, diet, exercise, and follow-up visits. °· Get help right away if you have signs or symptoms of a stroke. °· Get help right away if you cannot catch your breath, or you have chest pain or discomfort. °This information is not intended to replace advice given to you by your health care provider. Make sure you discuss any questions you have with your health care provider. °Document Revised: 01/07/2019 Document Reviewed: 01/07/2019 °Elsevier Patient Education © 2021 [employer] ° ° °Preventing Atrial Fibrillation-Related Stroke °Atrial fibrillation (AFib) is a common type of irregular or rapid heartbeat (arrhythmia) that increases the risk for a stroke.  In AFib, the top portions of the heart (atria) beat out of sync with the lower portions of the heart. When the muscles of the atria tighten in an uncoordinated way (fibrillating), blood can pool in the heart and form clots. °A stroke can be caused by a blood clot that travels to the brain. This type of stroke is preventable. Understanding AFib and knowing how to properly manage it can prevent a stroke from happening. °How can this condition affect me? °Having AFib can increase your risk for a stroke. A stroke is a medical emergency. It can lead to brain damage and can sometimes be life-threatening. A stroke can be a life-changing event. °What can increase my risk? °Having AFib can increase your risk of stroke. Other risk factors include: °· Having heart failure. °· Having high blood pressure. °· Being older than age 65. °· Having diabetes. °· Having a history of vascular disease, such as heart attack or stroke. °· Being   female. °· Having a history of transient ischemic attacks (TIAs). This is sometimes called a ministroke. °· Having a family history of stroke. °Risk factors that you can change include: °· Smoking. °· High cholesterol. °· Being inactive (sedentary lifestyle). °· Eating a diet that is high in fat, cholesterol, and salt. °What actions can I take to prevent this? °· Maintain a healthy weight. °· Get regular exercise. This can include at least 40 minutes of moderate exercise on most days, such as walking at a fast pace, or vigorous exercise, such as jogging. °· Get evaluated for obstructive sleep apnea. Talk to your health care provider about getting a sleep evaluation if you snore a lot or have excessive sleepiness. °· Manage any other medical conditions you have, such as hypertension or diabetes. °Medicines °· Take over-the-counter and prescription medicines only as told by your health care provider. °· If your health care provider prescribed an anticoagulant, take it exactly as told. Taking too much  blood-thinning medicine can cause bleeding. Taking too little may not give you the protection that you need against stroke and other problems. °Eating and drinking °· Eat healthy foods, including at least 5 servings of fruits and vegetables a day. °· Do not drink alcohol. °· Do not drink beverages that have caffeine, such as coffee, soda, and tea. °· Follow instructions about your diet as told by your health care provider. °Questions to ask your health care provider °Contact a health care provider if: °· You notice a change in the rate, rhythm, or strength of your heartbeat. °· You feel dizzy. °· You are taking an anticoagulant and you have more bruises than usual. °· You get tired more easily when you exercise or do similar activities. °Get help right away if: °· You have chest pain. °· You have trouble breathing. °· You have pain in your abdomen. °· You experience unusual sweating or weakness. °· You take anticoagulants and you: °? Have severe headaches or confusion. °? Have blood in your vomit, bowel movement, or urine. °? Have bleeding that will not stop. °? Fall or injure your head. °· You have any symptoms of a stroke. "BE FAST" is an easy way to remember the main warning signs of a stroke: °? B - Balance. Signs are dizziness, trouble walking, or loss of balance. °? E - Eyes. Signs are trouble seeing or a sudden change in vision. °? F - Face. Signs are sudden weakness or numbness of the face, or the face or eyelid drooping on one side. °? A - Arms. Signs are weakness or numbness in an arm. This happens suddenly and usually on one side of the body. °? S - Speech. Signs are sudden trouble speaking, slurred speech, or trouble understanding what people say. °? T - Time. Time to call emergency services. Write down what time symptoms started. °· You have other signs of a stroke, such as: °? A sudden, severe headache with no known cause. °? Nausea or vomiting. °? Seizure. °These symptoms may represent a serious  problem that is an emergency. Do not wait to see if the symptoms will go away. Get medical help right away. Call your local emergency services (911 in the [place] Do not drive yourself to the hospital.   °Summary °· Having atrial fibrillation (AFib) can increase the risk for a stroke. Talk with your health care provider about what symptoms to watch for. °· AFib-related stroke is preventable. Proper management of AFib can prevent you from having a stroke. °·   Talk with your health care provider about whether anticoagulant medicine is right for you.  Learn the warning signs of a stroke and remember "BE FAST." This information is not intended to replace advice given to you by your health care provider. Make sure you discuss any questions you have with your health care provider. Document Revised: 03/24/2020 Document Reviewed: 03/24/2020 Elsevier Patient Education  2021 [place] Gastroenteritis, Adult Salmonella gastroenteritis is an infection of the intestines. It can cause nausea, vomiting, and other symptoms. Fever usually lasts for 2-3 days, and diarrhea lasts 4-10 days. Most people recover completely, but some people may develop lasting problems, such as arthritis, irritation of the eyes, or painful urination. What are the causes? This condition is caused by salmonella bacteria. These bacteria can spread through food that is not cooked properly, contact with animals that have the bacteria, or contact with a person's stool. You can get this infection by:  Eating food or drinking liquids that have the bacteria.  Drinking polluted standing water.  Coming into contact with an animal that is carrying the bacteria, such as a turtle, bird, snake, or iguana. What increases the risk? This condition is more likely to develop in:  Elderly adults.  People with a weakened disease-fighting system (immune system).  People with poor personal or kitchen hygiene.  People who have contact  with animals that are known to carry the bacteria. What are the signs or symptoms? Symptoms of this condition include:  Diarrhea, which may be bloody.  Abdominal pain or cramps.  Fever.  Chills.  Nausea.  Vomiting.  Headache. How is this diagnosed? This condition may be diagnosed based on:  Your symptoms.  Your medical history.  A physical exam.  A blood test.  A stool test. How is this treated? This condition may be managed by:  Drinking plenty of fluids. This is important because this infection can make you lose a lot of fluid (dehydrated).  Taking antibiotic medicines. These may be given if your condition is severe. Medicines may help shorten your illness. Follow these instructions at home: Medicines  Take over-the-counter and prescription medicines only as told by your health care provider.  Do not take medicines to help with diarrhea. These medicines can make the infection worse.  If you were prescribed an antibiotic medicine, take it as told by your health care provider. Do not stop taking the antibiotic even if you start to feel better. Eating and drinking  Drink enough fluid to keep your urine pale yellow. This helps prevent dehydration.  Drink only clear liquids until your diarrhea, nausea, or vomiting is under control. Clear liquids are liquids you can see through, such as water, broth, fruit juice with added water (diluted fruit juice), low-calorie sports drinks, or non-caffeinated tea.  Take an oral rehydration solution (ORS) as told by your health care provider. This drink is sold at pharmacies and retail stores.  If you are not hungry, do not force yourself to eat.  Eat bland, easy-to-digest foods in small amounts as you are able. These foods include bananas, applesauce, rice, lean meats, toast and crackers.  Avoid: ? Fluids that contain a lot of sugar or caffeine, such as energy drinks, high-calorie sports drinks, and soda. ? Alcohol. ? Foods  that are greasy or contain a lot of fat or sugar. ? Spicy foods.  Do not prepare food for others if you have diarrhea.      Food safety  Use separate food preparation surfaces and  storage spaces for raw meat and for fruits and vegetables.  Keep refrigerated foods colder than 14F (5C).  Serve hot foods immediately or keep them heated above 114F (60C).  Always [place] meat, eggs, seafood, and poultry thoroughly.  Do not eat or drink unpasteurized dairy products.  Wash your hands thoroughly after handling or preparing meat, eggs, seafood, and poultry.  Wash your hands, food preparation surfaces, and utensils thoroughly before and after you handle raw foods.  Wash your hands thoroughly before eating.   General instructions  Wash your hands often with soap and water. If soap and water are not available, use hand sanitizer. This helps keep the bacteria from spreading to others.  Keep track of changes in your weight. Losing a lot of weight can be a sign of a serious problem. Ask your health care provider how much weight loss should concern you.  Stay home from work or school as told by your health care provider.  Keep all follow-up visits as told by your health care provider. This is important. Contact a health care provider if you:  Have a fever.  Have diarrhea that has blood or mucus in it.  Feel weak or dizzy.  Have a headache.  Have urinated only a small amount of very dark urine over 6-8 hours.  Have weight loss.  Have redness, irritation, or pain in your eyes.  Have pain when urinating.  Have swelling or a feeling of warmth in a joint. Get help right away if you:  Cannot keep fluids down.  Cannot stop vomiting or having diarrhea.  Have pain in the abdomen, and the pain gets worse.  Have any symptoms of severe dehydration. These include: ? Not urinating in 6-8 hours. ? Cold and clammy skin. ? Extreme thirst. ? Confusion. ? Rapid breathing. ? Difficulty  waking from sleep.  Have changes in vision or loss of vision. Summary  Salmonella gastroenteritis is an infection of the intestines. It can cause nausea, vomiting, and other symptoms. It is caused by salmonella bacteria.  People can get this condition by eating foods or drinking liquids that have the bacteria, or by coming into contact with an animal that is carrying the bacteria.  People with the condition need to drink plenty of fluids to avoid dehydration. Treatment for a severe case may include antibiotic medicines.  Follow your health care provider's instructions for taking medicines, eating and drinking, handling food in a safe way, and calling for help. This information is not intended to replace advice given to you by your health care provider. Make sure you discuss any questions you have with your health care provider. Document Revised: 08/26/2018 Document Reviewed: 08/29/2017 Elsevier Patient Education  2021 [employer].

## 2020-11-18 NOTE — Progress Notes (Signed)
Critical Value Alert Potassium 2.6  Notified by lab. Notified MD [place] Crane 08:36  Orders to add KCl 40mg  BID x 2 doses.

## 2020-11-18 NOTE — Discharge Summary (Signed)
Discharge Summary  Ruth Delgado GGY:[id number] DOB: May 27, 1935  PCP: Unk Pinto, MD  Admit date: 11/15/2020 Discharge date: 11/18/2020  Time spent: 35 minutes.  Recommendations for Outpatient Follow-up:  1. Follow-up with cardiology 2. Follow-up with your primary care provider 3. Take your medications as instructed 4. Repeat BMP on Wednesday, 11/23/2020.  Discharge Diagnoses:  Active Hospital Problems   Diagnosis Date Noted  . Atrial fibrillation with RVR ([place]) 11/15/2020  . Nausea, vomiting and diarrhea 11/15/2020  . Hypertension   . Hyperlipemia   . Type 2 diabetes mellitus with stage 3 chronic kidney disease, without long-term current use of insulin [facility])     Resolved Hospital Problems  No resolved problems to display.    Discharge Condition: Stable  Diet recommendation: Resume previous diet. Vitals:   11/18/20 0413 11/18/20 0946  BP: (!) 148/68 (!) 166/72  Pulse: 67 67  Resp: 16   Temp: 98 F (36.7 C)   SpO2:      History of present illness:  Ruth Papania Cookis a 86 y.o.femalewith medical history significant ofHTN; HLD; DM2; and paroxysmal afib presenting with sudden onset nausea vomiting and diarrhea after dinner on 11/14/2020. She had similar symptoms on 10/13/20; GI panel was negative at that time. She went to her PCP and had a stool sample and was started on Cipro/Flagyl with improvement.  Symptoms persist while in the hospital.  While in the ED she converted to A. fib with RVR and was started on Cardizem drip.  CT abdomen and pelvis was unrevealing.  Stool studies positive for C. difficile antigen.  GI panel is positive for Salmonella and adenovirus.  Heart rate is currently controlled, she is back on her p.o. Cardizem, DCed Cardizem drip on 11/16/20.  She is on Eliquis for CVA prevention.  11/18/20:  States she feels a lot better today.  States she is starting to have some formed stool.  Denies any nausea.  She is tolerating her diet well.  She is  eager to go home.  Hospital Course:  Principal Problem:   Atrial fibrillation with RVR (HCC) Active Problems:   Hypertension   Hyperlipemia   Type 2 diabetes mellitus with stage 3 chronic kidney disease, without long-term current use of insulin (HCC)   Nausea, vomiting and diarrhea  Acute intractable nausea vomiting and diarrhea, infective. Stool study positive for Salmonella, adenovirus. C. difficile antigen positive, C. difficile toxin and toxigenic C. difficile negative Nausea and vomiting have resolved. Diarrhea is improving  Hypokalemia Serum potassium 2.7 Repleted with p.o. KCl 40 mEq x 2 doses. Continue p.o. KCl 20 mEq daily x5 days. Repeat BMP on Wednesday, 11/23/2020. Magnesium 1.9 on 11/18/2020.  Newly diagnosed Salmonella gastroenteritis Presented with acute diarrhea of 1 day duration prior to presentation Diarrhea is improving. Continue p.o. ciprofloxacin 500 mg twice daily x5 days at home. Continue probiotics Florastor 250 mg twice daily x10 days at home Continue potassium supplement. Avoid dehydration.  Type 2 diabetes with hyperglycemia Hemoglobin A1c 6.6 on 09/02/2020. Follow-up with your PCP.  Paroxysmal A. fib with RVR Resume home Cardizem and home atenolol Continue Eliquis 5 mg twice daily for CVA prevention. Follow-up with cardiology Dr. Percival Spanish  Chronic anxiety/depression Stable Continue home Celexa.  Gout Stable Continue home allopurinol.  Code Status: DNR.  Family Communication:  Updated her daughter via phone on 11/18/2020.   Consultants:  Contacted cardiology to arrange for follow-up outpatient.  Procedures:  None.  Antimicrobials:  None.  DVT prophylaxis: Eliquis.   Discharge Exam: BP Marland Kitchen)  166/72   Pulse 67   Temp 98 F (36.7 C) (Oral)   Resp 16   Ht 5\' 5"  (1.651 m)   Wt 69.9 kg   SpO2 98%   BMI 25.63 kg/m  . General: 86 y.o. year-old female well developed well nourished in no acute distress.  Alert and  oriented x3. . Cardiovascular: Regular rate and rhythm with no rubs or gallops.  No thyromegaly or JVD noted.   Marland Kitchen Respiratory: Clear to auscultation with no wheezes or rales. Good inspiratory effort. . Abdomen: Soft nontender nondistended with normal bowel sounds x4 quadrants. . Musculoskeletal: No lower extremity edema. 2/4 pulses in all 4 extremities. . Skin: No ulcerative lesions noted or rashes, . Psychiatry: Mood is appropriate for condition and setting  Discharge Instructions You were cared for by a hospitalist during your hospital stay. If you have any questions about your discharge medications or the care you received while you were in the hospital after you are discharged, you can call the unit and asked to speak with the hospitalist on call if the hospitalist that took care of you is not available. Once you are discharged, your primary care physician will handle any further medical issues. Please note that NO REFILLS for any discharge medications will be authorized once you are discharged, as it is imperative that you return to your primary care physician (or establish a relationship with a primary care physician if you do not have one) for your aftercare needs so that they can reassess your need for medications and monitor your lab values.  Discharge Instructions    Amb referral to AFIB Clinic   Complete by: As directed      Allergies as of 11/18/2020      Reactions   Buspar [buspirone]    dysphoria   Lipitor [atorvastatin]    Myalgias   Metformin And Related    Gas/bloating   Nsaids    GI upset   Sulfa Antibiotics Other (See Comments)   Reaction=throat itching   Sulfonamide Derivatives       Medication List    STOP taking these medications   furosemide 40 MG tablet Commonly known as: LASIX   lisinopril 20 MG tablet Commonly known as: ZESTRIL     TAKE these medications   allopurinol 300 MG tablet Commonly known as: ZYLOPRIM TAKE 1 TABLET BY MOUTH DAILY TO PREVENT  GOUT What changed:   how much to take  how to take this  when to take this  additional instructions   apixaban 5 MG Tabs tablet Commonly known as: ELIQUIS Take 1 tablet (5 mg total) by mouth 2 (two) times daily. What changed:   medication strength  how much to take   atenolol 50 MG tablet Commonly known as: TENORMIN Take  1 tablet  Daily  for BP What changed:   how much to take  how to take this  when to take this  additional instructions   cholecalciferol 1000 units tablet Commonly known as: VITAMIN D Take 5,000 Units by mouth every other day.   ciprofloxacin 500 MG tablet Commonly known as: CIPRO Take 1 tablet (500 mg total) by mouth 2 (two) times daily for 5 days.   citalopram 20 MG tablet Commonly known as: CELEXA TAKE 1 TABLET DAILY FOR MOOD What changed: See the new instructions.   diltiazem 240 MG 24 hr capsule Commonly known as: CARDIZEM CD TAKE 1 CAPSULE BY MOUTH DAILY FOR BLOOD PRESSURE What changed:   how much  to take  how to take this  when to take this  additional instructions   Fish Oil 1000 MG Caps Take 1,000 mg by mouth daily.   freestyle lancets   GAS-X PO Take 2 tablets by mouth daily as needed (bloating).   hyoscyamine 0.125 MG SL tablet Commonly known as: LEVSIN SL Dissolve  1 to 2 tablets  Under tongue  3 to 4 x /day  if needed for Nausea, vomiting, cramping or diarrhea What changed:   how much to take  how to take this  when to take this  reasons to take this  additional instructions   ondansetron 8 MG disintegrating tablet Commonly known as: Zofran ODT Dissolve  1 tablet  under tongue  every 6 hours for  nausea  or vomitting What changed:   how much to take  how to take this  when to take this  reasons to take this  additional instructions   potassium chloride SA 20 MEQ tablet Commonly known as: KLOR-CON Take 1 tablet (20 mEq total) by mouth daily for 5 days.   pravastatin 40 MG  tablet Commonly known as: PRAVACHOL Take  1 tablet  at Bedtime  for Cholesterol What changed:   how much to take  how to take this  when to take this  additional instructions   saccharomyces boulardii 250 MG capsule Commonly known as: FLORASTOR Take 1 capsule (250 mg total) by mouth 2 (two) times daily for 10 days.   triamcinolone cream 0.1 % Commonly known as: KENALOG Apply 1 application topically daily as needed (itching).      Allergies  Allergen Reactions  . Buspar [Buspirone]     dysphoria  . Lipitor [Atorvastatin]     Myalgias  . Metformin And Related     Gas/bloating  . Nsaids     GI upset  . Sulfa Antibiotics Other (See Comments)    Reaction=throat itching  . Sulfonamide Derivatives     Follow-up Information    Unk Pinto, MD .   Specialty: Internal Medicine Contact information: [address] [place] [address] [phone]        [place]. Call on 01/05/2021.   Why: 2:30 for follow up with Greig Castilla information: [address] [place] [phone]       Minus Breeding, MD. Call in 1 day(s).   Specialty: Cardiology Why: Please call for a post hospital follow-up appointment. Contact information: [place] [address] [phone]                The results of significant diagnostics from this hospitalization (including imaging, microbiology, ancillary and laboratory) are listed below for reference.    Significant Diagnostic Studies: CT Head Wo Contrast  Result Date: 11/15/2020 CLINICAL DATA:  Dizziness, fall. EXAM: CT HEAD WITHOUT CONTRAST TECHNIQUE: Contiguous axial images were obtained from the base of the skull through the vertex without intravenous contrast. COMPARISON:  September 07, 2011. FINDINGS: Brain: No evidence of acute infarction, hemorrhage, hydrocephalus, extra-axial collection or mass  lesion/mass effect. Vascular: No hyperdense vessel or unexpected calcification. Skull: Normal. Negative for fracture or focal lesion. Sinuses/Orbits: No acute finding. Other: None. IMPRESSION: No acute intracranial abnormality seen. Electronically Signed   By: Marijo Conception M.D.   On: 11/15/2020 12:29   CT ABDOMEN PELVIS W CONTRAST  Result Date: 11/15/2020 CLINICAL DATA:  Dizziness, abdominal pain EXAM: CT ABDOMEN AND PELVIS WITH CONTRAST  TECHNIQUE: Multidetector CT imaging of the abdomen and pelvis was performed using the standard protocol following bolus administration of intravenous contrast. CONTRAST:  167mL OMNIPAQUE IOHEXOL 300 MG/ML  SOLN COMPARISON:  None. FINDINGS: Lower chest: Lung bases are essentially clear. Hepatobiliary: Subcentimeter cyst segment 5 (series 3/image 23). Gallbladder is unremarkable. No intrahepatic or extrahepatic ductal dilatation. Pancreas: Within normal limits. Spleen: Within normal limits. Adrenals/Urinary Tract: Adrenal glands are within normal limits. Kidneys are within normal limits.  No hydronephrosis. Bladder is within normal limits. Stomach/Bowel: Stomach is notable for a moderate hiatal hernia. No evidence of bowel obstruction. Appendix is not discretely visualized. Ascending and transverse colon is decompressed. Sigmoid diverticulosis, without evidence of diverticulitis. Vascular/Lymphatic: No evidence of abdominal aortic aneurysm. Atherosclerotic calcifications of the abdominal aorta and branch vessels. No suspicious abdominopelvic lymphadenopathy. Reproductive: Status post hysterectomy. Left ovary is notable for a 2.0 cm cyst/follicle (series 3/image 62). No right adnexal mass. Other: No abdominopelvic ascites. Musculoskeletal: Mild degenerative changes of the visualized thoracolumbar spine. Grade 1 anterolisthesis of L4 on L5. IMPRESSION: Sigmoid diverticulosis, without evidence of diverticulitis. Status post hysterectomy. No CT findings to account for the patient's  abdominal pain. Electronically Signed   By: Julian Hy M.D.   On: 11/15/2020 12:41    Microbiology: Recent Results (from the past 240 hour(s))  Gastrointestinal Panel by PCR , Stool     Status: Abnormal   Collection Time: 11/15/20  2:20 PM   Specimen: Stool  Result Value Ref Range Status   Campylobacter species NOT DETECTED NOT DETECTED Final   Plesimonas shigelloides NOT DETECTED NOT DETECTED Final   Salmonella species DETECTED (A) NOT DETECTED Final    Comment: RESULT CALLED TO, READ BACK BY AND VERIFIED WITH:  [facility] AT 1153 11/16/20 SDR    Yersinia enterocolitica NOT DETECTED NOT DETECTED Final   Vibrio species NOT DETECTED NOT DETECTED Final   Vibrio cholerae NOT DETECTED NOT DETECTED Final   Enteroaggregative E coli (EAEC) NOT DETECTED NOT DETECTED Final   Enteropathogenic E coli (EPEC) NOT DETECTED NOT DETECTED Final   Enterotoxigenic E coli (ETEC) NOT DETECTED NOT DETECTED Final   Shiga like toxin producing E coli (STEC) NOT DETECTED NOT DETECTED Final   Shigella/Enteroinvasive E coli (EIEC) NOT DETECTED NOT DETECTED Final   Cryptosporidium NOT DETECTED NOT DETECTED Final   Cyclospora cayetanensis NOT DETECTED NOT DETECTED Final   Entamoeba histolytica NOT DETECTED NOT DETECTED Final   Giardia lamblia NOT DETECTED NOT DETECTED Final   Adenovirus F40/41 DETECTED (A) NOT DETECTED Final   Astrovirus NOT DETECTED NOT DETECTED Final   Norovirus GI/GII NOT DETECTED NOT DETECTED Final   Rotavirus A NOT DETECTED NOT DETECTED Final   Sapovirus (I, II, IV, and V) NOT DETECTED NOT DETECTED Final    Comment: Performed at [facility], [address] [place], [place] [address]  C Difficile Quick Screen w PCR reflex     Status: Abnormal   Collection Time: 11/15/20  2:20 PM   Specimen: Stool  Result Value Ref Range Status   C Diff antigen POSITIVE (A) NEGATIVE Final   C Diff toxin NEGATIVE NEGATIVE Final   C Diff interpretation Results are indeterminate. See PCR  results.  Final    Comment: Performed at [facility], [place] [address], [place], [place] [address]  C. Diff by PCR, Reflexed     Status: None   Collection Time: 11/15/20  2:20 PM  Result Value Ref Range Status   Toxigenic C. Difficile by PCR  NEGATIVE NEGATIVE Final    Comment: Patient is colonized with non toxigenic C. difficile. May not need treatment unless significant symptoms are present. Performed at [facility], [place] [address], [place], [place] [address]   Resp Panel by RT-PCR (Flu A&B, Covid) Nasopharyngeal Swab     Status: None   Collection Time: 11/15/20  3:35 PM   Specimen: Nasopharyngeal Swab; Nasopharyngeal(NP) swabs in vial transport medium  Result Value Ref Range Status   SARS Coronavirus 2 by RT PCR NEGATIVE NEGATIVE Final    Comment: (NOTE) SARS-CoV-2 target nucleic acids are NOT DETECTED.  The SARS-CoV-2 RNA is generally detectable in upper respiratory specimens during the acute phase of infection. The lowest concentration of SARS-CoV-2 viral copies this assay can detect is 138 copies/mL. A negative result does not preclude SARS-Cov-2 infection and should not be used as the sole basis for treatment or other patient management decisions. A negative result may occur with  improper specimen collection/handling, submission of specimen other than nasopharyngeal swab, presence of viral mutation(s) within the areas targeted by this assay, and inadequate number of viral copies(<138 copies/mL). A negative result must be combined with clinical observations, patient history, and epidemiological information. The expected result is Negative.  Fact Sheet for Patients:  EntrepreneurPulse.com.au  Fact Sheet for Healthcare Providers:  IncredibleEmployment.be  This test is no t yet approved or cleared by the [place] FDA and  has been authorized for detection and/or diagnosis of SARS-CoV-2 by FDA under an Emergency Use  Authorization (EUA). This EUA will remain  in effect (meaning this test can be used) for the duration of the COVID-19 declaration under Section 564(b)(1) of the Act, 21 U.S.C.section 360bbb-3(b)(1), unless the authorization is terminated  or revoked sooner.       Influenza A by PCR NEGATIVE NEGATIVE Final   Influenza B by PCR NEGATIVE NEGATIVE Final    Comment: (NOTE) The Xpert Xpress SARS-CoV-2/FLU/RSV plus assay is intended as an aid in the diagnosis of influenza from Nasopharyngeal swab specimens and should not be used as a sole basis for treatment. Nasal washings and aspirates are unacceptable for Xpert Xpress SARS-CoV-2/FLU/RSV testing.  Fact Sheet for Patients: EntrepreneurPulse.com.au  Fact Sheet for Healthcare Providers: IncredibleEmployment.be  This test is not yet approved or cleared by the [place] FDA and has been authorized for detection and/or diagnosis of SARS-CoV-2 by FDA under an Emergency Use Authorization (EUA). This EUA will remain in effect (meaning this test can be used) for the duration of the COVID-19 declaration under Section 564(b)(1) of the Act, 21 U.S.C. section 360bbb-3(b)(1), unless the authorization is terminated or revoked.  Performed at [facility], [place] [address], [place], [place] [address]      Labs: Basic Metabolic Panel: Recent Labs  Lab 11/15/20 1015 11/18/20 0621 11/18/20 1251  NA 138 139  --   K 3.7 2.6* 2.7*  CL 106 107  --   CO2 19* 24  --   GLUCOSE 187* 91  --   BUN 15 10  --   CREATININE 0.84 0.79  --   CALCIUM 9.2 8.5*  --   MG  --  1.9  --   PHOS  --  2.1*  --    Liver Function Tests: Recent Labs  Lab 11/15/20 1015 11/18/20 0621  AST 19 20  ALT 14 16  ALKPHOS 38 34*  BILITOT 0.9 0.6  PROT 6.4* 5.6*  ALBUMIN 3.7 2.9*   Recent Labs  Lab 11/15/20 1015  LIPASE 24   No results for input(s): AMMONIA in the last 168 hours. CBC: Recent Labs  Lab 11/15/20 1100  11/18/20 0621  WBC 10.7* 4.9  NEUTROABS 9.3* 3.0  HGB 12.7 12.3  HCT 37.9 36.7  MCV 99.5 98.4  PLT 149* 142*   Cardiac Enzymes: No results for input(s): CKTOTAL, CKMB, CKMBINDEX, TROPONINI in the last 168 hours. BNP: BNP (last 3 results) No results for input(s): BNP in the last 8760 hours.  ProBNP (last 3 results) No results for input(s): PROBNP in the last 8760 hours.  CBG: No results for input(s): GLUCAP in the last 168 hours.     Signed:  Kayleen Memos, MD [facility] 11/18/2020, 7:05 PM

## 2020-11-18 NOTE — Evaluation (Signed)
Occupational Therapy Evaluation Patient Details Name: Ruth Delgado MRN: [id number] DOB: [birth year] Today's Date: 11/18/2020    History of Present Illness 86 y.o. female with medical history significant of HTN; HLD; DM2; and paroxysmal afib presenting with sudden onset nausea vomiting and diarrhea after dinner on 11/14/2020.  She had similar symptoms on 10/13/20; GI panel was negative at that time.  She went to her PCP and had a stool sample and was started on Cipro/Flagyl with improvement.  Symptoms persist while in the hospital.  While in the ED she converted to A. fib with RVR and was started on Cardizem drip.  CT abdomen and pelvis was unrevealing.  Stool studies positive for C. difficile antigen.  GI panel is positive for Salmonella and adenovirus.   Clinical Impression   Pt admitted to the ED for concerns listed above. PTA pt reported being very active, Independent with all ADL's and functional mobility, and loves spending time with all of her animals and her neighbors animals. Pt demonstrated continued independence with all ADL's and functional mobility during OT evaluation. Pt does not need OT services at this time and will be discharged from acute OT.    Follow Up Recommendations  No OT follow up    Equipment Recommendations  None recommended by OT    Recommendations for Other Services       Precautions / Restrictions Precautions Precautions: Other (comment) Precaution Comments: Cdiff Restrictions Weight Bearing Restrictions: No      Mobility Bed Mobility Overal bed mobility: Independent             General bed mobility comments: Pt able to complete all bed mobility from a flattened surface with no assistance    Transfers Overall transfer level: Independent Equipment used: None             General transfer comment: Pt stood from lowest bed setting and BSC with no difficulties    Balance Overall balance assessment: Independent                                          ADL either performed or assessed with clinical judgement   ADL Overall ADL's : Independent                                       General ADL Comments: Pt completed dressing EOB, simulated bathing while standing, completed a toilet transfer, and grooming standing at the sink. Pt has no difficulties with ADL's     Vision Baseline Vision/History: Wears glasses Wears Glasses: Reading only Patient Visual Report: No change from baseline Vision Assessment?: No apparent visual deficits     Perception Perception Perception Tested?: No   Praxis Praxis Praxis tested?: Not tested    Pertinent Vitals/Pain Pain Assessment: No/denies pain     Hand Dominance Right   Extremity/Trunk Assessment Upper Extremity Assessment Upper Extremity Assessment: Overall WFL for tasks assessed   Lower Extremity Assessment Lower Extremity Assessment: Defer to PT evaluation   Cervical / Trunk Assessment Cervical / Trunk Assessment: Normal   Communication Communication Communication: No difficulties   Cognition Arousal/Alertness: Awake/alert Behavior During Therapy: WFL for tasks assessed/performed Overall Cognitive Status: Within Functional Limits for tasks assessed  General Comments  VSS on RA. HR maintained in the 70's    Exercises     Shoulder Instructions      Home Living Family/patient expects to be discharged to:: Private residence Living Arrangements: Alone Available Help at Discharge: Neighbor;Available PRN/intermittently;Family Type of Home: House Home Access: Stairs to enter Technical brewer of Steps: 2 Entrance Stairs-Rails: Right Home Layout: One level     Bathroom Shower/Tub: Teacher, early years/pre: Standard Bathroom Accessibility: Yes How Accessible: Accessible via walker Home Equipment: [place] - single point          Prior Functioning/Environment  Level of Independence: Independent                 OT Problem List: Decreased strength;Decreased activity tolerance      OT Treatment/Interventions:      OT Goals(Current goals can be found in the care plan section) Acute Rehab OT Goals Patient Stated Goal: To go home to see her dog OT Goal Formulation: All assessment and education complete, DC therapy  OT Frequency:     Barriers to D/C:            Co-evaluation              AM-PAC OT "6 Clicks" Daily Activity     Outcome Measure Help from another person eating meals?: None Help from another person taking care of personal grooming?: None Help from another person toileting, which includes using toliet, bedpan, or urinal?: None Help from another person bathing (including washing, rinsing, drying)?: None Help from another person to put on and taking off regular upper body clothing?: None Help from another person to put on and taking off regular lower body clothing?: None 6 Click Score: 24   End of Session Equipment Utilized During Treatment: Gait belt Nurse Communication: Mobility status  Activity Tolerance: Patient tolerated treatment well Patient left: in bed;with call bell/phone within reach  OT Visit Diagnosis: Muscle weakness (generalized) (M62.81)                Time: 9528-4132 OT Time Calculation (min): 32 min Charges:  OT General Charges $OT Visit: 1 Visit OT Evaluation $OT Eval Low Complexity: 1 Low OT Treatments $Self Care/Home Management : 8-22 mins  Iyanna Drummer H., OTR/L Acute Rehabilitation  Shaqueta Casady Elane Shiquan Mathieu 11/18/2020, 11:40 AM

## 2020-11-21 ENCOUNTER — Telehealth: Payer: Self-pay | Admitting: Internal Medicine

## 2020-11-21 ENCOUNTER — Telehealth: Payer: Self-pay | Admitting: *Deleted

## 2020-11-21 NOTE — Telephone Encounter (Signed)
Copied from [place] [phone]. Topic: General - Other >> Nov 21, 2020  1:30 PM Leward Quan A wrote: Reason for CRM: Patient along with friend Nilda Riggs called in asking Dr Wynetta Emery to please place an order for blood work for patient per instructions from [place] on 6 N at [facility] since they requested that she have repeated blood work and patient will not be seen till June. Asking for a call back at  Ph# [phone]

## 2020-11-21 NOTE — Telephone Encounter (Signed)
Called patient on 11/21/2020 , 4:56 PM in an attempt to reach the patient for a hospital follow up. Spoke with patient. Patient states she is feeling better, but is weak.  Admit date: 11/15/20 Discharge: 11/18/20   She does not have any questions or concerns about medications from the hospital admission. The patient's medications were reviewed over the phone, they were counseled to bring in all current medications to the hospital follow up visit.   I advised the patient to call if any questions or concerns arise about the hospital admission or medications    Home health was not started in the hospital.  All questions were answered and a follow up appointment was made. Hospital follow up appointment on 05/05/202 with Dr Melford Aase.  Prior to Admission medications   Medication Sig Start Date End Date Taking? Authorizing Provider  allopurinol (ZYLOPRIM) 300 MG tablet TAKE 1 TABLET BY MOUTH DAILY TO PREVENT GOUT Patient taking differently: Take 300 mg by mouth daily. 12/16/19   Unk Pinto, MD  apixaban (ELIQUIS) 5 MG TABS tablet Take 1 tablet (5 mg total) by mouth 2 (two) times daily. 11/18/20 02/16/21  Kayleen Memos, DO  atenolol (TENORMIN) 50 MG tablet Take  1 tablet  Daily  for BP Patient taking differently: Take 50 mg by mouth daily. 09/23/20   Unk Pinto, MD  cholecalciferol (VITAMIN D) 1000 UNITS tablet Take 5,000 Units by mouth every other day.     [provider]  ciprofloxacin (CIPRO) 500 MG tablet Take 1 tablet (500 mg total) by mouth 2 (two) times daily for 5 days. 11/18/20 11/23/20  Kayleen Memos, DO  citalopram (CELEXA) 20 MG tablet TAKE 1 TABLET DAILY FOR MOOD Patient taking differently: Take 20 mg by mouth daily. 07/11/20   Unk Pinto, MD  diltiazem (CARDIZEM CD) 240 MG 24 hr capsule TAKE 1 CAPSULE BY MOUTH DAILY FOR BLOOD PRESSURE Patient taking differently: Take 240 mg by mouth daily. 03/17/20   Liane Comber, NP  hyoscyamine (LEVSIN SL) 0.125 MG SL tablet  Dissolve  1 to 2 tablets  Under tongue  3 to 4 x /day  if needed for Nausea, vomiting, cramping or diarrhea Patient taking differently: Take 0.125 mg by mouth every 6 (six) hours as needed for cramping. 10/13/20   Unk Pinto, MD  Lancets (FREESTYLE) lancets  02/10/16   [provider]  Omega-3 Fatty Acids (FISH OIL) 1000 MG CAPS Take 1,000 mg by mouth daily.    [provider]  ondansetron (ZOFRAN ODT) 8 MG disintegrating tablet Dissolve  1 tablet  under tongue  every 6 hours for  nausea  or vomitting Patient taking differently: Take 8 mg by mouth every 8 (eight) hours as needed for nausea or vomiting. 10/13/20   Unk Pinto, MD  potassium chloride SA (KLOR-CON) 20 MEQ tablet Take 1 tablet (20 mEq total) by mouth daily for 5 days. 11/18/20 11/23/20  Kayleen Memos, DO  pravastatin (PRAVACHOL) 40 MG tablet Take  1 tablet  at Bedtime  for Cholesterol Patient taking differently: Take 40 mg by mouth daily. 09/23/20   Unk Pinto, MD  saccharomyces boulardii (FLORASTOR) 250 MG capsule Take 1 capsule (250 mg total) by mouth 2 (two) times daily for 10 days. 11/18/20 11/28/20  Kayleen Memos, DO  Simethicone (GAS-X PO) Take 2 tablets by mouth daily as needed (bloating).    [provider]  triamcinolone cream (KENALOG) 0.1 % Apply 1 application topically daily as needed (itching). 10/02/16   [provider]

## 2020-11-21 NOTE — Telephone Encounter (Signed)
I left a message for the patient to return my call.

## 2020-11-22 ENCOUNTER — Other Ambulatory Visit: Payer: Medicare Other

## 2020-11-27 NOTE — Progress Notes (Deleted)
Cardiology Office Note:    Date:  11/27/2020   ID:  Ruth Delgado, DOB October 14, 1934, MRN [id number]  PCP:  Unk Pinto, MD  Cardiologist:  No primary care provider on file.  Electrophysiologist:  None   Referring MD: Unk Pinto, MD   No chief complaint on file. ***  History of Present Illness:    Ruth Delgado is a 86 y.o. female with a hx of atrial fibrillation, T2DM, hypertension, hyperlipidemia, gout who is referred by Dr. Melford Aase for evaluation of atrial fibrillation.  She was admitted to [facility] from 4/19 through 11/18/2020.  She presented with sudden onset nausea/vomiting/diarrhea.  On the ED she went into A. fib with RVR and was started on diltiazem drip.  This was transitioned to p.o. diltiazem and she was started on Eliquis 5 mg twice daily.  Wt Readings from Last 3 Encounters:  11/28/20 145 lb 12.8 oz (66.1 kg)  11/18/20 154 lb (69.9 kg)  10/13/20 157 lb 12.8 oz (71.6 kg)    BP Readings from Last 3 Encounters:  11/28/20 (!) 134/58  11/18/20 (!) 166/72  10/13/20 124/72     Past Medical History:  Diagnosis Date  . A-fib ([place])   . Anxiety   . Arthritis   . Diabetes mellitus   . Gout   . Hyperlipemia   . Hypertension     Past Surgical History:  Procedure Laterality Date  . ABDOMINAL HYSTERECTOMY    . BIOPSY BREAST     (L) BREAST IN 1993  . KNEE ARTHROSCOPY      Current Medications: No outpatient medications have been marked as taking for the 11/28/20 encounter (Appointment) with Donato Heinz, MD.     Allergies:   Buspar [buspirone], Lipitor [atorvastatin], Metformin and related, Nsaids, Sulfa antibiotics, and Sulfonamide derivatives   Social History   Socioeconomic History  . Marital status: Married    Spouse name: Not on file  . Number of children: Not on file  . Years of education: Not on file  . Highest education level: Not on file  Occupational History  . Occupation: retired  Tobacco Use  . Smoking status: Former Smoker     Packs/day: 0.50    Years: 25.00    Pack years: 12.50    Quit date: 07/30/2006    Years since quitting: 14.3  . Smokeless tobacco: Never Used  Substance and Sexual Activity  . Alcohol use: Yes    Alcohol/week: 2.0 standard drinks    Types: 2 Standard drinks or equivalent per week    Comment: occasional, none in 3 months  . Drug use: Never  . Sexual activity: Not on file  Other Topics Concern  . Not on file  Social History Narrative  . Not on file   Social Determinants of Health   Financial Resource Strain: Not on file  Food Insecurity: Not on file  Transportation Needs: Not on file  Physical Activity: Not on file  Stress: Not on file  Social Connections: Not on file     Family History: The patient's ***family history includes CVA in her father; Diabetes in her mother; Hypertension in her father.  ROS:   Please see the history of present illness.    *** All other systems reviewed and are negative.  EKGs/Labs/Other Studies Reviewed:    The following studies were reviewed today: ***  EKG:  EKG is *** ordered today.  The ekg ordered today demonstrates ***  Recent Labs: 09/02/2020: TSH 2.04 11/18/2020: ALT 16; BUN 10;  Creatinine, Ser 0.79; Hemoglobin 12.3; Magnesium 1.9; Platelets 142; Potassium 2.7; Sodium 139  Recent Lipid Panel    Component Value Date/Time   CHOL 179 09/02/2020 1035   TRIG 142 09/02/2020 1035   HDL 56 09/02/2020 1035   CHOLHDL 3.2 09/02/2020 1035   VLDL 28 01/14/2017 1114   LDLCALC 99 09/02/2020 1035    Physical Exam:    VS:  There were no vitals taken for this visit.    Wt Readings from Last 3 Encounters:  11/18/20 154 lb (69.9 kg)  10/13/20 157 lb 12.8 oz (71.6 kg)  09/19/20 161 lb 12.8 oz (73.4 kg)     GEN: *** Well nourished, well developed in no acute distress HEENT: Normal NECK: No JVD; No carotid bruits LYMPHATICS: No lymphadenopathy CARDIAC: ***RRR, no murmurs, rubs, gallops RESPIRATORY:  Clear to auscultation without rales,  wheezing or rhonchi  ABDOMEN: Soft, non-tender, non-distended MUSCULOSKELETAL:  No edema; No deformity  SKIN: Warm and dry NEUROLOGIC:  Alert and oriented x 3 PSYCHIATRIC:  Normal affect   ASSESSMENT:    No diagnosis found. PLAN:    Atrial fibrillation: Diagnosed during recent admission with gastroenteritis.  CHA2DS2-VASc score 5 (hypertension, age x2, diabetes, female) -Continue diltiazem 240 mg daily -Continue Eliquis 5 mg twice daily -Echocardiogram -Zio patch x2 weeks to evaluate A. fib burden and ensure adequate rate control  Hypertension: Currently on atenolol 50 mg daily, diltiazem 240 mg daily.  Would favor switching atenolol to avoid multiple AV nodal blockers, will switch to losartan 25 mg daily.  Asked patient to check BP daily for next 2 weeks and call with results.  Will check CMP  Hyperlipidemia: On pravastatin 40 mg daily.  LDL 99 on 09/02/2020  Hypokalemia: Potassium 2.6 on 11/18/2020.  Likely due to vomiting and diarrhea in the setting of Lasix use.  Lasix has been discontinued.  We will recheck CMP, magnesium  T2DM: A1c 6.6% on 09/02/2020.  Currently diet controlled.  RTC in 3 months  Medication Adjustments/Labs and Tests Ordered: Current medicines are reviewed at length with the patient today.  Concerns regarding medicines are outlined above.  No orders of the defined types were placed in this encounter.  No orders of the defined types were placed in this encounter.   There are no Patient Instructions on file for this visit.   Signed, Donato Heinz, MD  11/27/2020 1:21 PM    [facility]

## 2020-11-28 ENCOUNTER — Encounter: Payer: Self-pay | Admitting: Radiology

## 2020-11-28 ENCOUNTER — Ambulatory Visit (INDEPENDENT_AMBULATORY_CARE_PROVIDER_SITE_OTHER): Payer: Medicare Other

## 2020-11-28 ENCOUNTER — Encounter: Payer: Self-pay | Admitting: Cardiology

## 2020-11-28 ENCOUNTER — Other Ambulatory Visit: Payer: Self-pay

## 2020-11-28 ENCOUNTER — Ambulatory Visit: Payer: Medicare Other | Admitting: Cardiology

## 2020-11-28 VITALS — BP 134/58 | HR 62 | Ht 65.0 in | Wt 145.8 lb

## 2020-11-28 DIAGNOSIS — I10 Essential (primary) hypertension: Secondary | ICD-10-CM

## 2020-11-28 DIAGNOSIS — E785 Hyperlipidemia, unspecified: Secondary | ICD-10-CM | POA: Diagnosis not present

## 2020-11-28 DIAGNOSIS — I4891 Unspecified atrial fibrillation: Secondary | ICD-10-CM

## 2020-11-28 DIAGNOSIS — E876 Hypokalemia: Secondary | ICD-10-CM | POA: Diagnosis not present

## 2020-11-28 LAB — COMPREHENSIVE METABOLIC PANEL
ALT: 13 IU/L (ref 0–32)
AST: 17 IU/L (ref 0–40)
Albumin/Globulin Ratio: 1.9 (ref 1.2–2.2)
Albumin: 4.3 g/dL (ref 3.6–4.6)
Alkaline Phosphatase: 48 IU/L (ref 44–121)
BUN/Creatinine Ratio: 19 (ref 12–28)
BUN: 15 mg/dL (ref 8–27)
Bilirubin Total: 0.4 mg/dL (ref 0.0–1.2)
CO2: 25 mmol/L (ref 20–29)
Calcium: 9.3 mg/dL (ref 8.7–10.3)
Chloride: 102 mmol/L (ref 96–106)
Creatinine, Ser: 0.8 mg/dL (ref 0.57–1.00)
Globulin, Total: 2.3 g/dL (ref 1.5–4.5)
Glucose: 97 mg/dL (ref 65–99)
Potassium: 4.2 mmol/L (ref 3.5–5.2)
Sodium: 142 mmol/L (ref 134–144)
Total Protein: 6.6 g/dL (ref 6.0–8.5)
eGFR: 73 mL/min/{1.73_m2} (ref >59)

## 2020-11-28 LAB — CBC
Hematocrit: 37.7 % (ref 34.0–46.6)
Hemoglobin: 13.2 g/dL (ref 11.1–15.9)
MCH: 34 pg — ABNORMAL HIGH (ref 26.6–33.0)
MCHC: 35 g/dL (ref 31.5–35.7)
MCV: 97 fL (ref 79–97)
Platelets: 274 10*3/uL (ref 150–450)
RBC: 3.88 x10E6/uL (ref 3.77–5.28)
RDW: 13.5 % (ref 11.7–15.4)
WBC: 9.3 10*3/uL (ref 3.4–10.8)

## 2020-11-28 LAB — MAGNESIUM: Magnesium: 2.2 mg/dL (ref 1.6–2.3)

## 2020-11-28 LAB — TSH: TSH: 1.24 u[IU]/mL (ref 0.450–4.500)

## 2020-11-28 MED ORDER — LOSARTAN POTASSIUM 25 MG PO TABS: 25.0000 mg | ORAL_TABLET | Freq: Every day | ORAL | 3 refills | Status: DC

## 2020-11-28 NOTE — Progress Notes (Addendum)
Cardiology Office Note:    Date:  11/28/2020   ID:  [name], DOB [birth year], MRN [id number]  PCP:  Unk Pinto, MD  Cardiologist:  No primary care provider on file.  Electrophysiologist:  None   Referring MD: Unk Pinto, MD   Chief Complaint  Patient presents with  . Atrial Fibrillation    History of Present Illness:    Ruth Delgado is a 86 y.o. female with a hx of atrial fibrillation, T2DM, hypertension, hyperlipidemia, gout who is referred by Dr. Melford Aase for evaluation of atrial fibrillation.  She was admitted to [facility] from 4/19 through 11/18/2020.  She presented with sudden onset nausea/vomiting/diarrhea.  On the ED she went into A. fib with RVR and was started on diltiazem drip.  This was transitioned to p.o. diltiazem and she was started on Eliquis 5 mg twice daily.  Today, she reports the first mention to her about Afib was about 2 years ago, and then not again until her recent hospitalization. She also has a blood clot in her right LE, diagnosed one month ago, at which time she was started on Eliquis.  Of note she says she has had LE edema and her legs are sore. She continues to wear compression socks. She has also had some sharp pains in her right abdomen, and she is unsure if this is due to gastric issues. Occasionally she feels a "flutter" in her chest about once a week, and is short-lasting, "it comes and it's gone." She did not feel these palpitations while in the hospital with Afib. She denies any shortness of breath, syncope, or lightheadedness. No orthopnea or PND. She used to walk 3 miles a day but is struggling to participate in formal exercise lately due to her hospitalization and health issues. She has not smoked within the past 30 years, and is unaware of any past cardiac history in her family.    Past Medical History:  Diagnosis Date  . A-fib ([place])   . Anxiety   . Arthritis   . Diabetes mellitus   . Gout   . Hyperlipemia   . Hypertension      Past Surgical History:  Procedure Laterality Date  . ABDOMINAL HYSTERECTOMY    . BIOPSY BREAST     (L) BREAST IN 1993  . KNEE ARTHROSCOPY      Current Medications: Current Meds  Medication Sig  . allopurinol (ZYLOPRIM) 300 MG tablet TAKE 1 TABLET BY MOUTH DAILY TO PREVENT GOUT (Patient taking differently: Take 300 mg by mouth daily.)  . apixaban (ELIQUIS) 5 MG TABS tablet Take 1 tablet (5 mg total) by mouth 2 (two) times daily.  . cholecalciferol (VITAMIN D) 1000 UNITS tablet Take 5,000 Units by mouth every other day.   . citalopram (CELEXA) 20 MG tablet TAKE 1 TABLET DAILY FOR MOOD (Patient taking differently: Take 20 mg by mouth daily.)  . diltiazem (CARDIZEM CD) 240 MG 24 hr capsule TAKE 1 CAPSULE BY MOUTH DAILY FOR BLOOD PRESSURE (Patient taking differently: Take 240 mg by mouth daily.)  . Lancets (FREESTYLE) lancets   . losartan (COZAAR) 25 MG tablet Take 1 tablet (25 mg total) by mouth daily.  . pravastatin (PRAVACHOL) 40 MG tablet Take  1 tablet  at Bedtime  for Cholesterol (Patient taking differently: Take 40 mg by mouth daily.)  . saccharomyces boulardii (FLORASTOR) 250 MG capsule Take 1 capsule (250 mg total) by mouth 2 (two) times daily for 10 days.  . Simethicone (GAS-X PO) Take 2  tablets by mouth daily as needed (bloating).  . triamcinolone cream (KENALOG) 0.1 % Apply 1 application topically daily as needed (itching).  . [DISCONTINUED] atenolol (TENORMIN) 50 MG tablet Take  1 tablet  Daily  for BP (Patient taking differently: Take 50 mg by mouth daily.)     Allergies:   Buspar [buspirone], Lipitor [atorvastatin], Metformin and related, Nsaids, Sulfa antibiotics, and Sulfonamide derivatives   Social History   Socioeconomic History  . Marital status: Married    Spouse name: Not on file  . Number of children: Not on file  . Years of education: Not on file  . Highest education level: Not on file  Occupational History  . Occupation: retired  Tobacco Use  . Smoking  status: Former Smoker    Packs/day: 0.50    Years: 25.00    Pack years: 12.50    Quit date: 07/30/2006    Years since quitting: 14.3  . Smokeless tobacco: Never Used  Substance and Sexual Activity  . Alcohol use: Yes    Alcohol/week: 2.0 standard drinks    Types: 2 Standard drinks or equivalent per week    Comment: occasional, none in 3 months  . Drug use: Never  . Sexual activity: Not on file  Other Topics Concern  . Not on file  Social History Narrative  . Not on file   Social Determinants of Health   Financial Resource Strain: Not on file  Food Insecurity: Not on file  Transportation Needs: Not on file  Physical Activity: Not on file  Stress: Not on file  Social Connections: Not on file     Family History: The patient's family history includes CVA in her father; Diabetes in her mother; Hypertension in her father.  ROS:   Please see the history of present illness. (+) Thrombus, right knee (+) LE edema (+) LE soreness (+) Palpitations (+) Right abdominal pain All other systems reviewed and are negative.  EKGs/Labs/Other Studies Reviewed:    The following studies were reviewed today:   EKG:   11/28/2020: Normal Sinus Rhythm. Rate 62 bpm. No ST abnormalities   Recent Labs: 09/02/2020: TSH 2.04 11/18/2020: ALT 16; BUN 10; Creatinine, Ser 0.79; Hemoglobin 12.3; Magnesium 1.9; Platelets 142; Potassium 2.7; Sodium 139  Recent Lipid Panel    Component Value Date/Time   CHOL 179 09/02/2020 1035   TRIG 142 09/02/2020 1035   HDL 56 09/02/2020 1035   CHOLHDL 3.2 09/02/2020 1035   VLDL 28 01/14/2017 1114   LDLCALC 99 09/02/2020 1035    Physical Exam:    VS:  BP (!) 134/58   Pulse 62   Ht 5\' 5"  (1.651 m)   Wt 145 lb 12.8 oz (66.1 kg)   SpO2 98%   BMI 24.26 kg/m     Wt Readings from Last 3 Encounters:  11/28/20 145 lb 12.8 oz (66.1 kg)  11/18/20 154 lb (69.9 kg)  10/13/20 157 lb 12.8 oz (71.6 kg)     GEN: Well nourished, well developed in no acute  distress HEENT: Normal NECK: No JVD; No carotid bruits LYMPHATICS: No lymphadenopathy CARDIAC: RRR, no murmurs, rubs, gallops RESPIRATORY:  Clear to auscultation without rales, wheezing or rhonchi  ABDOMEN: Soft, non-tender, non-distended MUSCULOSKELETAL:  No edema; No deformity  SKIN: Warm and dry NEUROLOGIC:  Alert and oriented x 3 PSYCHIATRIC:  Normal affect   ASSESSMENT:    1. Atrial fibrillation, unspecified type ([place])   2. Essential hypertension   3. Hyperlipidemia, unspecified hyperlipidemia type   4.  Hypokalemia    PLAN:    Atrial fibrillation: Diagnosed during recent admission with gastroenteritis.  CHA2DS2-VASc score 5 (hypertension, age x2, diabetes, female) -Continue diltiazem 240 mg daily -Continue Eliquis 5 mg twice daily -Echocardiogram -Zio patch x2 weeks to evaluate A. fib burden and ensure adequate rate control  Hypertension: Currently on atenolol 50 mg daily, diltiazem 240 mg daily.  Would favor switching atenolol to avoid multiple AV nodal blockers, will switch to losartan 25 mg daily.  Asked patient to check BP daily for next 2 weeks and call with results.  Will check CMP  Hyperlipidemia: On pravastatin 40 mg daily.  LDL 99 on 09/02/2020  Hypokalemia: Potassium 2.6 on 11/18/2020.  Likely due to vomiting and diarrhea in the setting of Lasix use.  Lasix has been discontinued.  We will recheck CMP, magnesium  ?DVT: Reports she was recently diagnosed with DVT at appointment with vein specialist.  Will obtain records.  Continue Eliquis as above  T2DM: A1c 6.6% on 09/02/2020.  Currently diet controlled.  RTC in 3 months   Medication Adjustments/Labs and Tests Ordered: Current medicines are reviewed at length with the patient today.  Concerns regarding medicines are outlined above.  Orders Placed This Encounter  Procedures  . Comprehensive metabolic panel  . Magnesium  . TSH  . CBC  . LONG TERM MONITOR (3-14 DAYS)  . EKG 12-Lead  . ECHOCARDIOGRAM COMPLETE    Meds ordered this encounter  Medications  . losartan (COZAAR) 25 MG tablet    Sig: Take 1 tablet (25 mg total) by mouth daily.    Dispense:  90 tablet    Refill:  3    Patient Instructions  Medication Instructions:  STOP atenolol START Losartan 25 mg daily  *If you need a refill on your cardiac medications before your next appointment, please call your pharmacy*   Lab Work: CMET, CBC, Mag, TSH  If you have labs (blood work) drawn today and your tests are completely normal, you will receive your results only by: Marland Kitchen MyChart Message (if you have MyChart) OR . A paper copy in the mail If you have any lab test that is abnormal or we need to change your treatment, we will call you to review the results.   Testing/Procedures: Your physician has requested that you have an echocardiogram. Echocardiography is a painless test that uses sound waves to create images of your heart. It provides your doctor with information about the size and shape of your heart and how well your heart's chambers and valves are working. This procedure takes approximately one hour. There are no restrictions for this procedure.  This will be done at our [facility] location:  [place] has requested you wear your ZIO patch monitor__14_days.   This is a single patch monitor.  [employer] supplies one patch monitor per enrollment.  Additional stickers are not available.   Please do not apply patch if you will be having a Nuclear Stress Test, Echocardiogram, Cardiac CT, MRI, or Chest Xray during the time frame you would be wearing the monitor. The patch cannot be worn during these tests.  You cannot remove and re-apply the ZIO XT patch monitor.   Your ZIO patch monitor will be sent USPS Priority mail from [facility] directly to your home address. The monitor may also be mailed to a PO BOX if home delivery is not available.    It  may take 3-5 days to receive your monitor after you have been enrolled.   Once you have received you monitor, please review enclosed instructions.  Your monitor has already been registered assigning a specific monitor serial # to you.   Applying the monitor   Shave hair from upper left chest.   Hold abrader disc by orange tab.  Rub abrader in 40 strokes over left upper chest as indicated in your monitor instructions.   Clean area with 4 enclosed alcohol pads .  Use all pads to assure are is cleaned thoroughly.  Let dry.   Apply patch as indicated in monitor instructions.  Patch will be place under collarbone on left side of chest with arrow pointing upward.   Rub patch adhesive wings for 2 minutes.Remove white label marked "1".  Remove white label marked "2".  Rub patch adhesive wings for 2 additional minutes.   While looking in a mirror, press and release button in center of patch.  A small green light will flash 3-4 times .  This will be your only indicator the monitor has been turned on.     Do not shower for the first 24 hours.  You may shower after the first 24 hours.   Press button if you feel a symptom. You will hear a small click.  Record Date, Time and Symptom in the Patient Log Book.   When you are ready to remove patch, follow instructions on last 2 pages of Patient Log Book.  Stick patch monitor onto last page of Patient Log Book.   Place Patient Log Book in [place] box.  Use locking tab on box and tape box closed securely.  The [employer] has [employer] on it.  Please place in mailbox as soon as possible.  Your physician should have your test results approximately 7 days after the monitor has been mailed back to [facility].   Call [place] at [phone] if you have questions regarding your ZIO XT patch monitor.  Call them immediately if you see an orange light blinking on your monitor.   If your monitor falls off in less than 4 days  contact our Monitor department at [phone].  If your monitor becomes loose or falls off after 4 days call [employer] at [phone] for suggestions on securing your monitor.   Follow-Up: At [facility], you and your health needs are our priority.  As part of our continuing mission to provide you with exceptional heart care, we have created designated Provider Care Teams.  These Care Teams include your primary Cardiologist (physician) and Advanced Practice Providers (APPs -  Physician Assistants and Nurse Practitioners) who all work together to provide you with the care you need, when you need it.  We recommend signing up for the patient portal called "MyChart".  Sign up information is provided on this After Visit Summary.  MyChart is used to connect with patients for Virtual Visits (Telemedicine).  Patients are able to view lab/test results, encounter notes, upcoming appointments, etc.  Non-urgent messages can be sent to your provider as well.   To learn more about what you can do with MyChart, go to [email or web address].    Your next appointment:   3 month(s)  The format for your next appointment:   In Person  Provider:   Oswaldo Milian, MD   Please check your blood pressure at home daily, write it down.  Call the office or send message via [place] with the readings  in 2 weeks for Dr. Gardiner Rhyme to review.      I,Mathew Stumpf,acting as a Education administrator for Donato Heinz, MD.,have documented all relevant documentation on the behalf of Donato Heinz, MD,as directed by  Donato Heinz, MD while in the presence of Donato Heinz, MD.  I, Donato Heinz, MD, have reviewed all documentation for this visit. The documentation on 11/28/20 for the exam, diagnosis, procedures, and orders are all accurate and complete.   Signed, Donato Heinz, MD  11/28/2020 11:02 AM    [place]

## 2020-11-28 NOTE — Patient Instructions (Addendum)
Medication Instructions:  STOP atenolol START Losartan 25 mg daily  *If you need a refill on your cardiac medications before your next appointment, please call your pharmacy*   Lab Work: CMET, CBC, Mag, TSH  If you have labs (blood work) drawn today and your tests are completely normal, you will receive your results only by: Marland Kitchen MyChart Message (if you have MyChart) OR . A paper copy in the mail If you have any lab test that is abnormal or we need to change your treatment, we will call you to review the results.   Testing/Procedures: Your physician has requested that you have an echocardiogram. Echocardiography is a painless test that uses sound waves to create images of your heart. It provides your doctor with information about the size and shape of your heart and how well your heart's chambers and valves are working. This procedure takes approximately one hour. There are no restrictions for this procedure.  This will be done at our [facility] location:  [place] has requested you wear your ZIO patch monitor__14_days.   This is a single patch monitor.  [employer] supplies one patch monitor per enrollment.  Additional stickers are not available.   Please do not apply patch if you will be having a Nuclear Stress Test, Echocardiogram, Cardiac CT, MRI, or Chest Xray during the time frame you would be wearing the monitor. The patch cannot be worn during these tests.  You cannot remove and re-apply the ZIO XT patch monitor.   Your ZIO patch monitor will be sent USPS Priority mail from [facility] directly to your home address. The monitor may also be mailed to a PO BOX if home delivery is not available.   It may take 3-5 days to receive your monitor after you have been enrolled.   Once you have received you monitor, please review enclosed instructions.  Your monitor has already been registered assigning  a specific monitor serial # to you.   Applying the monitor   Shave hair from upper left chest.   Hold abrader disc by orange tab.  Rub abrader in 40 strokes over left upper chest as indicated in your monitor instructions.   Clean area with 4 enclosed alcohol pads .  Use all pads to assure are is cleaned thoroughly.  Let dry.   Apply patch as indicated in monitor instructions.  Patch will be place under collarbone on left side of chest with arrow pointing upward.   Rub patch adhesive wings for 2 minutes.Remove white label marked "1".  Remove white label marked "2".  Rub patch adhesive wings for 2 additional minutes.   While looking in a mirror, press and release button in center of patch.  A small green light will flash 3-4 times .  This will be your only indicator the monitor has been turned on.     Do not shower for the first 24 hours.  You may shower after the first 24 hours.   Press button if you feel a symptom. You will hear a small click.  Record Date, Time and Symptom in the Patient Log Book.   When you are ready to remove patch, follow instructions on last 2 pages of Patient Log Book.  Stick patch monitor onto last page of Patient Log Book.   Place Patient Log Book in [place] box.  Use locking tab on box and tape box closed securely.  The [employer] has [employer] on it.  Please place in mailbox as soon as possible.  Your physician should have your test results approximately 7 days after the monitor has been mailed back to [facility].   Call [place] at [phone] if you have questions regarding your ZIO XT patch monitor.  Call them immediately if you see an orange light blinking on your monitor.   If your monitor falls off in less than 4 days contact our Monitor department at [phone].  If your monitor becomes loose or falls off after 4 days call [employer] at [phone] for suggestions on securing your monitor.   Follow-Up: At [facility], you and your health needs are our priority.  As part of our continuing mission to provide you with exceptional heart care, we have created designated Provider Care Teams.  These Care Teams include your primary Cardiologist (physician) and Advanced Practice Providers (APPs -  Physician Assistants and Nurse Practitioners) who all work together to provide you with the care you need, when you need it.  We recommend signing up for the patient portal called "MyChart".  Sign up information is provided on this After Visit Summary.  MyChart is used to connect with patients for Virtual Visits (Telemedicine).  Patients are able to view lab/test results, encounter notes, upcoming appointments, etc.  Non-urgent messages can be sent to your provider as well.   To learn more about what you can do with MyChart, go to [email or web address].    Your next appointment:   3 month(s)  The format for your next appointment:   In Person  Provider:   Oswaldo Milian, MD   Please check your blood pressure at home daily, write it down.  Call the office or send message via Mychart with the readings in 2 weeks for Dr. Gardiner Rhyme to review.

## 2020-11-28 NOTE — Progress Notes (Signed)
Enrolled patient for a 14 day Zio XT Monitor to be mailed to patients home  

## 2020-11-30 NOTE — Patient Instructions (Signed)

## 2020-11-30 NOTE — Progress Notes (Incomplete)
Future Appointments  Date Time Provider [place]  5/5 /2022  4:30 PM Unk Pinto, MD [facility] None  12/21/2020 11:30 AM Unk Pinto, MD [facility] None  01/05/2021  2:30 PM Ladell Pier, MD [facility] None  02/13/2021 11:00 AM Unk Pinto, MD [facility] None  03/01/2021 11:00 AM Donato Heinz, MD [facility]  03/21/2021 11:30 AM Unk Pinto, MD [facility] None  05/30/2021 11:30 AM Garnet Sierras, NP [facility] None     [facility] Follow-Up     This very nice 86 y.o. WWDF was admitted to the hospital on 11/15/2020 and patient was discharged from the hospital on  11/18/2020 . The patient now presents now 13 days post discharge  for follow up for transition from recent hospitalization.  The day after discharge  our clinical staff contacted the patient to assure stability and schedule a follow up appointment. The discharge summary, medications and diagnostic test results were reviewed before meeting with the patient. The patient was admitted for:       Stool study positive for Salmonella, adenovirus. C. difficile antigen positive, C. difficile toxin and toxigenic C. difficile negative      Hospitalization discharge instructions and medications are reconciled with the patient.      Patient is also followed with Hypertension, Hyperlipidemia, Pre-Diabetes and Vitamin D Deficiency.      Patient is treated for HTN & BP has been controlled at home. Today's  . Patient has had no complaints of any cardiac type chest pain, palpitations, dyspnea/orthopnea/PND, dizziness, claudication, or dependent edema.     Hyperlipidemia is controlled with diet & meds. Patient denies myalgias or other med SE's. Last Lipids were  Lab Results  Component Value Date   CHOL 179 09/02/2020   HDL 56 09/02/2020   LDLCALC 99 09/02/2020   TRIG 142 09/02/2020   CHOLHDL 3.2 09/02/2020      Also, the patient has history of T2_NIDDM PreDiabetes and has had no  symptoms of reactive hypoglycemia, diabetic polys, paresthesias or visual blurring.  Last A1c was  Lab Results  Component Value Date   HGBA1C 6.6 (H) 09/02/2020      Further, the patient also has history of Vitamin D Deficiency and supplements vitamin D without any suspected side-effects. Last vitamin D was   Lab Results  Component Value Date   VD25OH 21 09/02/2020   Current Outpatient Medications on File Prior to Visit  Medication Sig  . allopurinol (ZYLOPRIM) 300 MG tablet TAKE 1 TABLET BY MOUTH DAILY TO PREVENT GOUT (Patient taking differently: Take 300 mg by mouth daily.)  . apixaban (ELIQUIS) 5 MG TABS tablet Take 1 tablet (5 mg total) by mouth 2 (two) times daily.  . cholecalciferol (VITAMIN D) 1000 UNITS tablet Take 5,000 Units by mouth every other day.   . citalopram (CELEXA) 20 MG tablet TAKE 1 TABLET DAILY FOR MOOD (Patient taking differently: Take 20 mg by mouth daily.)  . diltiazem (CARDIZEM CD) 240 MG 24 hr capsule TAKE 1 CAPSULE BY MOUTH DAILY FOR BLOOD PRESSURE (Patient taking differently: Take 240 mg by mouth daily.)  . hyoscyamine (LEVSIN SL) 0.125 MG SL tablet Dissolve  1 to 2 tablets  Under tongue  3 to 4 x /day  if needed for Nausea, vomiting, cramping or diarrhea (Patient taking differently: Take 0.125 mg by mouth every 6 (six) hours as needed for cramping.)  . Lancets (FREESTYLE) lancets   . losartan (COZAAR) 25 MG tablet Take 1 tablet (25 mg total) by mouth daily.  Marland Kitchen  Omega-3 Fatty Acids (FISH OIL) 1000 MG CAPS Take 1,000 mg by mouth daily. (Patient not taking: Reported on 11/28/2020)  . ondansetron (ZOFRAN ODT) 8 MG disintegrating tablet Dissolve  1 tablet  under tongue  every 6 hours for  nausea  or vomitting (Patient taking differently: Take 8 mg by mouth every 8 (eight) hours as needed for nausea or vomiting.)  . potassium chloride SA (KLOR-CON) 20 MEQ tablet Take 1 tablet (20 mEq total) by mouth daily for 5 days. (Patient not taking: Reported on 11/28/2020)  .  pravastatin (PRAVACHOL) 40 MG tablet Take  1 tablet  at Bedtime  for Cholesterol (Patient taking differently: Take 40 mg by mouth daily.)  . Simethicone (GAS-X PO) Take 2 tablets by mouth daily as needed (bloating).  . triamcinolone cream (KENALOG) 0.1 % Apply 1 application topically daily as needed (itching).   No current facility-administered medications on file prior to visit.   Allergies  Allergen Reactions  . Buspar [Buspirone]     dysphoria  . Lipitor [Atorvastatin]     Myalgias  . Metformin And Related     Gas/bloating  . Nsaids     GI upset  . Sulfa Antibiotics Other (See Comments)    Reaction=throat itching  . Sulfonamide Derivatives    PMHx:   Past Medical History:  Diagnosis Date  . A-fib ([place])   . Anxiety   . Arthritis   . Diabetes mellitus   . Gout   . Hyperlipemia   . Hypertension    Immunization History  Administered Date(s) Administered  . Influenza Split 07/13/2014  . Influenza, High Dose Seasonal PF 04/14/2015, 04/16/2017, 06/13/2018, 04/08/2019  . PFIZER(Purple Top)SARS-COV-2 Vaccination 08/21/2019, 09/11/2019, 05/19/2020  . Pneumococcal-Unspecified 06/07/2003  . Td 06/02/2007, 11/12/2017  . Zoster 05/30/2012   Past Surgical History:  Procedure Laterality Date  . ABDOMINAL HYSTERECTOMY    . BIOPSY BREAST     (L) BREAST IN 1993  . KNEE ARTHROSCOPY     FHx:    Reviewed / unchanged  SHx:    Reviewed / unchanged  Systems Review:  Constitutional: Denies fever, chills, wt changes, headaches, insomnia, fatigue, night sweats, change in appetite. Eyes: Denies redness, blurred vision, diplopia, discharge, itchy, watery eyes.  ENT: Denies discharge, congestion, post nasal drip, epistaxis, sore throat, earache, hearing loss, dental pain, tinnitus, vertigo, sinus pain, snoring.  CV: Denies chest pain, palpitations, irregular heartbeat, syncope, dyspnea, diaphoresis, orthopnea, PND, claudication or edema. Respiratory: denies cough, dyspnea, DOE, pleurisy,  hoarseness, laryngitis, wheezing.  Gastrointestinal: Denies dysphagia, odynophagia, heartburn, reflux, water brash, abdominal pain or cramps, nausea, vomiting, bloating, diarrhea, constipation, hematemesis, melena, hematochezia  or hemorrhoids. Genitourinary: Denies dysuria, frequency, urgency, nocturia, hesitancy, discharge, hematuria or flank pain. Musculoskeletal: Denies arthralgias, myalgias, stiffness, jt. swelling, pain, limping or strain/sprain.  Skin: Denies pruritus, rash, hives, warts, acne, eczema or change in skin lesion(s). Neuro: No weakness, tremor, incoordination, spasms, paresthesia or pain. Psychiatric: Denies confusion, memory loss or sensory loss. Endo: Denies change in weight, skin or hair change.  Heme/Lymph: No excessive bleeding, bruising or enlarged lymph nodes.  Physical Exam  There were no vitals taken for this visit.  Appears well nourished, well groomed  and in no distress.  Eyes: PERRLA, EOMs, conjunctiva no swelling or erythema. Sinuses: No frontal/maxillary tenderness ENT/Mouth: EAC's clear, TM's nl w/o erythema, bulging. Nares clear w/o erythema, swelling, exudates. Oropharynx clear without erythema or exudates. Oral hygiene is good. Tongue normal, non obstructing. Hearing intact.  Neck: Supple. Thyroid nl. Car 2+/2+ without  bruits, nodes or JVD. Chest: Respirations nl with BS clear & equal w/o rales, rhonchi, wheezing or stridor.  Cor: Heart sounds normal w/ regular rate and rhythm without sig. murmurs, gallops, clicks or rubs. Peripheral pulses normal and equal  without edema.  Abdomen: Soft & bowel sounds normal. Non-tender w/o guarding, rebound, hernias, masses or organomegaly.  Lymphatics: Unremarkable.  Musculoskeletal: Full ROM all peripheral extremities, joint stability, 5/5 strength and normal gait.  Skin: Warm, dry without exposed rashes, lesions or ecchymosis apparent.  Neuro: Cranial nerves intact, reflexes equal bilaterally. Sensory-motor  testing grossly intact. Tendon reflexes grossly intact.  Pysch: Alert & oriented x 3.  Insight and judgement nl & appropriate. No ideations.  Assessment and Plan:  - Continue medication, monitor blood pressure at home.  - Continue DASH diet.  Reminder to go to the ER if any CP,  SOB, nausea, dizziness, severe HA, changes vision/speech.   - Continue diet/meds, exercise,& lifestyle modifications.  - Continue monitor periodic cholesterol/liver & renal functions   - Continue diet, exercise, lifestyle modifications.  - Monitor appropriate labs. - Continue supplementation.      Discussed  regular exercise, BP monitoring, weight control to achieve/maintain BMI less than 25 and discussed meds and SE's. Recommended labs to assess and monitor clinical status with further disposition pending results of labs. Over 30 minutes of exam, counseling, chart review was performed.   Kirtland Bouchard, MD

## 2020-11-30 NOTE — Progress Notes (Signed)
Future Appointments  Date Time Provider [place]  5/5 /2022  4:30 PM Unk Pinto, MD [facility] None  12/21/2020 11:30 AM Unk Pinto, MD [facility] None  01/05/2021  2:30 PM Ladell Pier, MD [facility] None  02/13/2021 11:00 AM Unk Pinto, MD [facility] None  03/01/2021 11:00 AM Donato Heinz, MD [facility]  03/21/2021 11:30 AM Unk Pinto, MD [facility] None  05/30/2021 11:30 AM Garnet Sierras, NP [facility] None     [facility] Follow-Up     This very nice 86 y.o. WWDF was admitted to the hospital on 11/15/2020 and patient was discharged from the hospital on  11/18/2020 . The patient now presents now 13 days post discharge  for follow up for transition from recent hospitalization.  The day after discharge  our clinical staff contacted the patient to assure stability and schedule a follow up appointment. The discharge summary, medications and diagnostic test results were reviewed before meeting with the patient. The patient was admitted for:   Atrial fibrillation with RVR (HCC) Nausea vomiting and diarrhea  Essential hypertension  Hyperlipidemia associated with type 2 diabetes mellitus Type 2 diabetes mellitus with stage 3a chronic kidney  disease, without long-term current use of insulin (HCC) -  Hypokalemia  Salmonella gastroenteritis Chronic anxiety     Patient was hospitalized with sudden onset of nausea, emesis, diarrhea and in the er she developed Afib with RVRwhich converted on Cardiazem drip. Abd /Pelvic CT scan was Negative. Stool studies returned (+) for C.Diff antigen and  GI path panel returned (+) for  Salmonella and adenovirus. She was discharged to complete Cipro 5 more days (10 days total course).     Hospitalization discharge instructions and medications are reconciled with the patient.      Patient is also followed with Hypertension, Hyperlipidemia, Pre-Diabetes and Vitamin D Deficiency.      Patient is treated  for HTN & BP has been controlled at home. Today's BP is at goal -  126/80. Patient has had no complaints of any cardiac type chest pain, palpitations, dyspnea/orthopnea/PND, dizziness, claudication, or dependent edema.     Hyperlipidemia is controlled with diet & meds. Patient denies myalgias or other med SE's. Last Lipids were  Lab Results  Component Value Date   CHOL 179 09/02/2020   HDL 56 09/02/2020   LDLCALC 99 09/02/2020   TRIG 142 09/02/2020   CHOLHDL 3.2 09/02/2020      Also, the patient has history of T2_NIDDM controlling with diet.  Patient denies  symptoms of reactive hypoglycemia, diabetic polys, paresthesias or visual blurring.  Last A1c was not at goal:  Lab Results  Component Value Date   HGBA1C 6.6 (H) 09/02/2020      Further, the patient also has history of Vitamin D Deficiency and supplements vitamin D without any suspected side-effects. Last vitamin D was  Still low :  Lab Results  Component Value Date   VD25OH 2 09/02/2020   Current Outpatient Medications on File Prior to Visit  Medication Sig  . allopurinol  300 MG tablet TAKE 1 TABLET  DAILY   . apixaban  5 MG TABS  Take 1 tablet  2 ) times daily.  Marland Kitchen cVITAMIN D 1000 UNITS  Take 5,000 Units  every other day.   . citalopram  20 MG tablet TAKE 1 TABLET DAILY FOR MOOD   . diltiazem CD 240 MG  TAKE 1 CAPSULE  DAILY   . Hyoscyamine SL 0.125 MG SL  Take every 6  hours as needed for cramping  . losartan  25 MG tablet Take 1 tablet  daily.  . pravastatin  40 MG tablet Take  1 tablet  at Bedtime    . triamcinolone crm  0.1 % Apply 1 application topically daily as needed (itching).    Allergies  Allergen Reactions  . Buspar [Buspirone]     dysphoria  . Lipitor [Atorvastatin]     Myalgias  . Metformin And Related     Gas/bloating  . Nsaids     GI upset  . Sulfa Antibiotics Other (See Comments)    Reaction=throat itching  . Sulfonamide Derivatives    PMHx:   Past Medical History:  Diagnosis Date  . A-fib  ([place])   . Anxiety   . Arthritis   . Diabetes mellitus   . Gout   . Hyperlipemia   . Hypertension    Immunization History  Administered Date(s) Administered  . Influenza Split 07/13/2014  . Influenza, High Dose Seasonal PF 04/14/2015, 04/16/2017, 06/13/2018, 04/08/2019  . PFIZER(Purple Top)SARS-COV-2 Vaccination 08/21/2019, 09/11/2019, 05/19/2020  . Pneumococcal-Unspecified 06/07/2003  . Td 06/02/2007, 11/12/2017  . Zoster 05/30/2012   Past Surgical History:  Procedure Laterality Date  . ABDOMINAL HYSTERECTOMY    . BIOPSY BREAST     (L) BREAST IN 1993  . KNEE ARTHROSCOPY     FHx:    Reviewed / unchanged  SHx:    Reviewed / unchanged  Systems Review:  Constitutional: Denies fever, chills, wt changes, headaches, insomnia, fatigue, night sweats, change in appetite. Eyes: Denies redness, blurred vision, diplopia, discharge, itchy, watery eyes.  ENT: Denies discharge, congestion, post nasal drip, epistaxis, sore throat, earache, hearing loss, dental pain, tinnitus, vertigo, sinus pain, snoring.  CV: Denies chest pain, palpitations, irregular heartbeat, syncope, dyspnea, diaphoresis, orthopnea, PND, claudication or edema. Respiratory: denies cough, dyspnea, DOE, pleurisy, hoarseness, laryngitis, wheezing.  Gastrointestinal: Denies dysphagia, odynophagia, heartburn, reflux, water brash, abdominal pain or cramps, nausea, vomiting, bloating, diarrhea, constipation, hematemesis, melena, hematochezia  or hemorrhoids. Genitourinary: Denies dysuria, frequency, urgency, nocturia, hesitancy, discharge, hematuria or flank pain. Musculoskeletal: Denies arthralgias, myalgias, stiffness, jt. swelling, pain, limping or strain/sprain.  Skin: Denies pruritus, rash, hives, warts, acne, eczema or change in skin lesion(s). Neuro: No weakness, tremor, incoordination, spasms, paresthesia or pain. Psychiatric: Denies confusion, memory loss or sensory loss. Endo: Denies change in weight, skin or hair  change.  Heme/Lymph: No excessive bleeding, bruising or enlarged lymph nodes.  Physical Exam  BP 126/80   Pulse 77   Temp 97.7 F (36.5 C)   Resp 16   Ht 5' 4.5" (1.638 m)   Wt 154 lb 12.8 oz (70.2 kg)   SpO2 98%   BMI 26.16 kg/m   Appears well nourished, well groomed  and in no distress.  Eyes: PERRLA, EOMs, conjunctiva no swelling or erythema. Sinuses: No frontal/maxillary tenderness ENT/Mouth: EAC's clear, TM's nl w/o erythema, bulging. Nares clear w/o erythema, swelling, exudates. Oropharynx clear without erythema or exudates. Oral hygiene is good. Tongue normal, non obstructing. Hearing intact.  Neck: Supple. Thyroid nl. Car 2+/2+ without bruits, nodes or JVD. Chest: Respirations nl with BS clear & equal w/o rales, rhonchi, wheezing or stridor.  Cor: Heart sounds normal w/ regular rate and rhythm without sig. murmurs, gallops, clicks or rubs. Peripheral pulses normal and equal  without edema.  Abdomen: Soft & bowel sounds normal. Non-tender w/o guarding, rebound, hernias, masses or organomegaly.  Lymphatics: Unremarkable.  Musculoskeletal: Full ROM all peripheral extremities, joint stability, 5/5 strength and  normal gait.  Skin: Warm, dry without exposed rashes, lesions or ecchymosis apparent.  Neuro: Cranial nerves intact, reflexes equal bilaterally. Sensory-motor testing grossly intact. Tendon reflexes grossly intact.  Pysch: Alert & oriented x 3.  Insight and judgement nl & appropriate. No ideations.  Assessment and Plan:  1. Atrial fibrillation with RVR ([place])   2. Nausea vomiting and diarrhea  - CBC with Differential/Platelet - COMPLETE METABOLIC PANEL WITH GFR  3. Essential hypertension  - Continue medication, monitor blood pressure at home.  - Continue DASH diet.  Reminder to go to the ER if any CP,  SOB, nausea, dizziness, severe HA, changes vision/speech.  - CBC with Differential/Platelet - COMPLETE METABOLIC PANEL WITH GFR  4. Hyperlipidemia associated  with type 2 diabetes mellitus ([place])  - Continue diet/meds, exercise,& lifestyle modifications.  Cornelius Moras- Continue supplementation.nue monitor periodic cholesterol/liver & renal functions    5. Type 2 diabetes mellitus with stage 3a chronic kidney  disease, without long-term current use of insulin (HCC)  - Continue diet, exercise, lifestyle modifications.  - Monitor appropriate labs. - COMPLETE METABOLIC PANEL WITH GFR  6. Hypokalemia  - COMPLETE METABOLIC PANEL WITH GFR  7. Salmonella gastroenteritis  - CBC with Differential/Platelet  8. Chronic anxiety   9. Medication management  - CBC with Differential/Platelet - COMPLETE METABOLIC PANEL WITH GFR         Discussed  regular exercise, BP monitoring, weight control to achieve/maintain BMI less than 25 and discussed meds and SE's. Recommended labs to assess and monitor clinical status with further disposition pending results of labs. Over 30 minutes of exam, counseling, chart review was performed.   Kirtland Bouchard, MD

## 2020-12-01 ENCOUNTER — Other Ambulatory Visit: Payer: Self-pay

## 2020-12-01 ENCOUNTER — Ambulatory Visit: Payer: Medicare Other | Admitting: Internal Medicine

## 2020-12-01 ENCOUNTER — Other Ambulatory Visit: Payer: Self-pay | Admitting: *Deleted

## 2020-12-01 VITALS — BP 126/80 | HR 77 | Temp 97.7°F | Resp 16 | Ht 64.5 in | Wt 154.8 lb

## 2020-12-01 DIAGNOSIS — R112 Nausea with vomiting, unspecified: Secondary | ICD-10-CM | POA: Diagnosis not present

## 2020-12-01 DIAGNOSIS — E785 Hyperlipidemia, unspecified: Secondary | ICD-10-CM

## 2020-12-01 DIAGNOSIS — Z79899 Other long term (current) drug therapy: Secondary | ICD-10-CM

## 2020-12-01 DIAGNOSIS — N1831 Chronic kidney disease, stage 3a: Secondary | ICD-10-CM

## 2020-12-01 DIAGNOSIS — I4891 Unspecified atrial fibrillation: Secondary | ICD-10-CM | POA: Diagnosis not present

## 2020-12-01 DIAGNOSIS — A020 Salmonella enteritis: Secondary | ICD-10-CM

## 2020-12-01 DIAGNOSIS — E1169 Type 2 diabetes mellitus with other specified complication: Secondary | ICD-10-CM | POA: Diagnosis not present

## 2020-12-01 DIAGNOSIS — I10 Essential (primary) hypertension: Secondary | ICD-10-CM

## 2020-12-01 DIAGNOSIS — R197 Diarrhea, unspecified: Secondary | ICD-10-CM

## 2020-12-01 DIAGNOSIS — E876 Hypokalemia: Secondary | ICD-10-CM

## 2020-12-01 DIAGNOSIS — F419 Anxiety disorder, unspecified: Secondary | ICD-10-CM

## 2020-12-01 DIAGNOSIS — E1122 Type 2 diabetes mellitus with diabetic chronic kidney disease: Secondary | ICD-10-CM

## 2020-12-01 LAB — COMPLETE METABOLIC PANEL WITH GFR
AG Ratio: 2 (calc) (ref 1.0–2.5)
ALT: 13 U/L (ref 6–29)
AST: 14 U/L (ref 10–35)
Albumin: 4.3 g/dL (ref 3.6–5.1)
Alkaline phosphatase (APISO): 42 U/L (ref 37–153)
BUN: 16 mg/dL (ref 7–25)
CO2: 30 mmol/L (ref 20–32)
Calcium: 9.6 mg/dL (ref 8.6–10.4)
Chloride: 104 mmol/L (ref 98–110)
Creat: 0.8 mg/dL (ref 0.60–0.88)
GFR, Est African American: 79 mL/min/{1.73_m2} (ref >=60)
GFR, Est Non African American: 68 mL/min/{1.73_m2} (ref >=60)
Globulin: 2.2 g/dL (calc) (ref 1.9–3.7)
Glucose, Bld: 78 mg/dL (ref 65–99)
Potassium: 5 mmol/L (ref 3.5–5.3)
Sodium: 141 mmol/L (ref 135–146)
Total Bilirubin: 0.5 mg/dL (ref 0.2–1.2)
Total Protein: 6.5 g/dL (ref 6.1–8.1)

## 2020-12-01 LAB — CBC WITH DIFFERENTIAL/PLATELET
Absolute Monocytes: 592 cells/uL (ref 200–950)
Basophils Absolute: 89 cells/uL (ref 0–200)
Basophils Relative: 1.2 %
Eosinophils Absolute: 104 cells/uL (ref 15–500)
Eosinophils Relative: 1.4 %
HCT: 38.3 % (ref 35.0–45.0)
Hemoglobin: 12.4 g/dL (ref 11.7–15.5)
Lymphs Abs: 2768 cells/uL (ref 850–3900)
MCH: 32 pg (ref 27.0–33.0)
MCHC: 32.4 g/dL (ref 32.0–36.0)
MCV: 98.7 fL (ref 80.0–100.0)
MPV: 10.7 fL (ref 7.5–12.5)
Monocytes Relative: 8 %
Neutro Abs: 3848 cells/uL (ref 1500–7800)
Neutrophils Relative %: 52 %
Platelets: 277 10*3/uL (ref 140–400)
RBC: 3.88 10*6/uL (ref 3.80–5.10)
RDW: 13.6 % (ref 11.0–15.0)
Total Lymphocyte: 37.4 %
WBC: 7.4 10*3/uL (ref 3.8–10.8)

## 2020-12-02 ENCOUNTER — Encounter: Payer: Self-pay | Admitting: Internal Medicine

## 2020-12-02 ENCOUNTER — Telehealth: Payer: Self-pay | Admitting: Cardiology

## 2020-12-02 DIAGNOSIS — N182 Chronic kidney disease, stage 2 (mild): Secondary | ICD-10-CM

## 2020-12-02 DIAGNOSIS — I4891 Unspecified atrial fibrillation: Secondary | ICD-10-CM | POA: Diagnosis not present

## 2020-12-02 DIAGNOSIS — I10 Essential (primary) hypertension: Secondary | ICD-10-CM

## 2020-12-02 DIAGNOSIS — E1122 Type 2 diabetes mellitus with diabetic chronic kidney disease: Secondary | ICD-10-CM

## 2020-12-02 DIAGNOSIS — E876 Hypokalemia: Secondary | ICD-10-CM

## 2020-12-02 NOTE — Telephone Encounter (Signed)
Pt aware of recommendations and agrees with plan ./cy 

## 2020-12-02 NOTE — Telephone Encounter (Signed)
Pt c/o medication issue:  1. Name of Medication: losartan (COZAAR) 25 MG tablet  2. How are you currently taking this medication (dosage and times per day)? As prescribed   3. Are you having a reaction (difficulty breathing--STAT)? No   4. What is your medication issue? Wanting to know if she still needs to take this due to the office advising her that her potassium levels came back good. States she heard you can take too much potassium. Requesting a detailed VM be left if she does not answer so she can call back with questions if need be.

## 2020-12-02 NOTE — Telephone Encounter (Signed)
Normal labs, potassium is back to normal. Recommend rechecking BMET in 2 weeks since started losartan

## 2020-12-04 NOTE — Progress Notes (Signed)
============================================================ ============================================================  -     CBC - Kidneys - Electrolytes &  Liver   - all  Normal / OK  <><><><><><><><><><><><><><><><><><><><><><><><><><><><><><><><><> <><><><><><><><><><><><><><><><><><><><><><><><><><><><><><><><><> 

## 2020-12-05 NOTE — Progress Notes (Signed)
PATIENT IS AWARE OF LAB RESULTS. Ruth Delgado [facility]

## 2020-12-05 NOTE — Progress Notes (Signed)
PATIENT IS AWARE OF LAB RESULTS AND INSTRUCTIONS. -E WELCH

## 2020-12-08 ENCOUNTER — Telehealth: Payer: Self-pay | Admitting: Cardiology

## 2020-12-08 DIAGNOSIS — I4891 Unspecified atrial fibrillation: Secondary | ICD-10-CM

## 2020-12-08 NOTE — Telephone Encounter (Signed)
Spoke with patient regarding the Friday 12/09/20 8:30 am A Fib clinic appointment ---arrival time is 8:15am --1st floor admissions office for check in.  Patient voiced her understanding.-

## 2020-12-08 NOTE — Telephone Encounter (Signed)
Returned call to patient-patient reports BP now 103/56, HR 95.  Denies symptoms other than fatigue.    Will arrange afib clinic appt, patient agreeable.      Patient scheduled tomorrow with Afib clinic.

## 2020-12-08 NOTE — Progress Notes (Addendum)
Primary Care Physician: Ruth Pinto, MD Primary Cardiologist: Dr Gardiner Rhyme  Primary Electrophysiologist: none Referring Physician: Dr Grace Blight is a 86 y.o. female with a history of T2DM, hypertension, hyperlipidemia, gout , atrial fibrillation who presents for consultation in the [facility]. The patient was initially diagnosed with atrial fibrillation remotely. More recently she presented to the ED with an acute GI illness. ECG showed afib with RVR. She converted to SR spontaneously. Patient is on Eliquis for a CHADS2VASC score of 5. She was seen by Dr Gardiner Rhyme 11/28/20 and a Zio monitor was placed to evaluate arrhythmia burden. On 12/08/20 she called the clinic with elevated heart rates. She reports the episode lasted about one hour. She feels well today.   Today, she denies symptoms of chest pain, shortness of breath, orthopnea, PND, lower extremity edema, dizziness, presyncope, syncope, snoring, daytime somnolence, bleeding, or neurologic sequela. The patient is tolerating medications without difficulties and is otherwise without complaint today.    Atrial Fibrillation Risk Factors:  she does not have symptoms or diagnosis of sleep apnea. she does not have a history of rheumatic fever.   she has a BMI of Body mass index is 25.69 kg/m.Marland Kitchen Filed Weights   12/09/20 0839  Weight: 68.9 kg    Family History  Problem Relation Age of Onset  . Diabetes Mother        suicide at age 9  . Hypertension Father   . CVA Father      Atrial Fibrillation Management history:  Previous antiarrhythmic drugs: none Previous cardioversions: none Previous ablations: none CHADS2VASC score: 5 Anticoagulation history: Eliquis   Past Medical History:  Diagnosis Date  . A-fib ([place])   . Anxiety   . Arthritis   . Diabetes mellitus   . Gout   . Hyperlipemia   . Hypertension    Past Surgical History:  Procedure Laterality Date  . ABDOMINAL  HYSTERECTOMY    . BIOPSY BREAST     (L) BREAST IN 1993  . KNEE ARTHROSCOPY      Current Outpatient Medications  Medication Sig Dispense Refill  . acetaminophen (TYLENOL) 650 MG CR tablet Take 650 mg by mouth every 8 (eight) hours as needed for pain.    Marland Kitchen allopurinol (ZYLOPRIM) 300 MG tablet TAKE 1 TABLET BY MOUTH DAILY TO PREVENT GOUT 90 tablet 3  . apixaban (ELIQUIS) 5 MG TABS tablet Take 1 tablet (5 mg total) by mouth 2 (two) times daily. 180 tablet 0  . cholecalciferol (VITAMIN D) 1000 UNITS tablet Take 5,000 Units by mouth every other day.     . citalopram (CELEXA) 20 MG tablet TAKE 1 TABLET DAILY FOR MOOD 90 tablet 3  . diltiazem (CARDIZEM CD) 240 MG 24 hr capsule TAKE 1 CAPSULE BY MOUTH DAILY FOR BLOOD PRESSURE 90 capsule 3  . Lancets (FREESTYLE) lancets   12  . losartan (COZAAR) 25 MG tablet Take 1 tablet (25 mg total) by mouth daily. 90 tablet 3  . potassium chloride SA (KLOR-CON) 20 MEQ tablet Take 1 tablet (20 mEq total) by mouth daily for 5 days. 5 tablet 0  . pravastatin (PRAVACHOL) 40 MG tablet Take  1 tablet  at Bedtime  for Cholesterol (Patient taking differently: Take  1 tablet  at Bedtime  for Cholesterol) 90 tablet 0  . Simethicone (GAS-X PO) Take 2 tablets by mouth daily as needed (bloating).    . triamcinolone cream (KENALOG) 0.1 % Apply 1 application topically daily  as needed (itching).  2   No current facility-administered medications for this encounter.    Allergies  Allergen Reactions  . Buspar [Buspirone]     dysphoria  . Lipitor [Atorvastatin]     Myalgias  . Metformin And Related     Gas/bloating  . Nsaids     GI upset  . Sulfa Antibiotics Other (See Comments)    Reaction=throat itching  . Sulfonamide Derivatives     Social History   Socioeconomic History  . Marital status: Married    Spouse name: Not on file  . Number of children: Not on file  . Years of education: Not on file  . Highest education level: Not on file  Occupational History  .  Occupation: retired  Tobacco Use  . Smoking status: Former Smoker    Packs/day: 0.50    Years: 25.00    Pack years: 12.50    Quit date: 07/30/2006    Years since quitting: 14.3  . Smokeless tobacco: Never Used  Substance and Sexual Activity  . Alcohol use: Yes    Alcohol/week: 2.0 standard drinks    Types: 2 Standard drinks or equivalent per week    Comment: occasional, none in 3 months  . Drug use: Never  . Sexual activity: Not on file  Other Topics Concern  . Not on file  Social History Narrative  . Not on file   Social Determinants of Health   Financial Resource Strain: Not on file  Food Insecurity: Not on file  Transportation Needs: Not on file  Physical Activity: Not on file  Stress: Not on file  Social Connections: Not on file  Intimate Partner Violence: Not on file     ROS- All systems are reviewed and negative except as per the HPI above.  Physical Exam: Vitals:   12/09/20 0839  BP: (!) 160/80  Pulse: 80  Weight: 68.9 kg  Height: 5' 4.5" (1.638 m)    GEN- The patient is a well appearing elderly female, alert and oriented x 3 today.   Head- normocephalic, atraumatic Eyes-  Sclera clear, conjunctiva pink Ears- hearing intact Oropharynx- clear Neck- supple  Lungs- Clear to ausculation bilaterally, normal work of breathing Heart- Regular rate and rhythm, no murmurs, rubs or gallops  GI- soft, NT, ND, + BS Extremities- no clubbing, cyanosis, or edema MS- no significant deformity or atrophy Skin- no rash or lesion Psych- euthymic mood, full affect Neuro- strength and sensation are intact  Wt Readings from Last 3 Encounters:  12/09/20 68.9 kg  12/01/20 70.2 kg  11/28/20 66.1 kg    EKG today demonstrates  SR Vent. rate 80 BPM PR interval 146 ms QRS duration 72 ms QT/QTcB 382/440 ms  Epic records are reviewed at length today  CHA2DS2-VASc Score = 5  The patient's score is based upon: CHF History: No HTN History: Yes Diabetes History:  Yes Stroke History: No Vascular Disease History: No Age Score: 2 Gender Score: 1      ASSESSMENT AND PLAN: 1. Paroxysmal Atrial Fibrillation (ICD10:  I48.0) The patient's CHA2DS2-VASc score is 5, indicating a 7.2% annual risk of stroke.   General education about afib provided and questions answered. Will increase diltiazem to 300 mg daily Check afib burden once monitor has resulted. Can consider AAD if afib is more persistent or difficult to rate control.  Echocardiogram scheduled.  Continue Eliquis 5 mg BID  2. Secondary Hypercoagulable State (ICD10:  D68.69) The patient is at significant risk for stroke/thromboembolism based upon  her CHA2DS2-VASc Score of 5.  Continue Apixaban (Eliquis).   3. HTN Elevated today, med changes as above.   Follow up in the AF clinic in 2-3 weeks.    [facility] [address] [place], [place] [address] [phone] 12/09/2020 8:47 AM

## 2020-12-08 NOTE — Telephone Encounter (Signed)
Spoke with pt regarding increased heart rate. Pt states that she woke up this morning and took her blood pressure and pulse, 138/85 and HR 128.  Pt took her medication and at 10:30am retook her blood pressure and pulse, 112/85 and HR 146 before she called. Pt was recently in the hospital and at that time it was discovered that she was in a-fib. Pt had a follow up [facility] appointment with Dr. Gardiner Rhyme at which time a 14 day zio patch was ordered. Pt states that she can feel her heart racing but denies light headedness, dizziness or shortness of breath. Confirmed with pt that diltiazem and losartan was taken this morning. Will forward to Dr. Gardiner Rhyme to advise.

## 2020-12-08 NOTE — Telephone Encounter (Signed)
Can we get her into Afib clinic?

## 2020-12-08 NOTE — Telephone Encounter (Signed)
Pt c/o BP issue: STAT if pt c/o blurred vision, one-sided weakness or slurred speech  1. What are your last 5 BP readings? 138/85  Pulse 128, 128/95 and pulse 146  2. Are you having any other symptoms (ex. Dizziness, headache, blurred vision, passed out)?  Diarrhea started this morning  3. What is your BP issue? Blood pressure is high for her and pulse is high

## 2020-12-09 ENCOUNTER — Encounter (HOSPITAL_COMMUNITY): Payer: Self-pay | Admitting: Physician Assistant

## 2020-12-09 ENCOUNTER — Ambulatory Visit (HOSPITAL_COMMUNITY)
Admission: RE | Admit: 2020-12-09 | Discharge: 2020-12-09 | Disposition: A | Discharge status: Home / Self Care | Payer: Medicare Other | Source: Ambulatory Visit | Attending: Physician Assistant | Admitting: Physician Assistant

## 2020-12-09 ENCOUNTER — Other Ambulatory Visit: Payer: Self-pay

## 2020-12-09 VITALS — BP 160/80 | HR 80 | Ht 64.5 in | Wt 152.0 lb

## 2020-12-09 DIAGNOSIS — I480 Paroxysmal atrial fibrillation: Secondary | ICD-10-CM | POA: Insufficient documentation

## 2020-12-09 DIAGNOSIS — Z7901 Long term (current) use of anticoagulants: Secondary | ICD-10-CM | POA: Diagnosis not present

## 2020-12-09 DIAGNOSIS — Z87891 Personal history of nicotine dependence: Secondary | ICD-10-CM | POA: Insufficient documentation

## 2020-12-09 DIAGNOSIS — D6869 Other thrombophilia: Secondary | ICD-10-CM | POA: Insufficient documentation

## 2020-12-09 DIAGNOSIS — Z8249 Family history of ischemic heart disease and other diseases of the circulatory system: Secondary | ICD-10-CM | POA: Diagnosis not present

## 2020-12-09 DIAGNOSIS — M109 Gout, unspecified: Secondary | ICD-10-CM | POA: Insufficient documentation

## 2020-12-09 DIAGNOSIS — I10 Essential (primary) hypertension: Secondary | ICD-10-CM | POA: Insufficient documentation

## 2020-12-09 DIAGNOSIS — Z79899 Other long term (current) drug therapy: Secondary | ICD-10-CM | POA: Diagnosis not present

## 2020-12-09 MED ORDER — DILTIAZEM HCL ER COATED BEADS 300 MG PO CP24: 300.0000 mg | ORAL_CAPSULE | Freq: Every day | ORAL | 1 refills | Status: DC

## 2020-12-09 NOTE — Patient Instructions (Signed)
Increase cardizem to 300mg once a day  

## 2020-12-12 ENCOUNTER — Telehealth: Payer: Self-pay | Admitting: Cardiology

## 2020-12-12 DIAGNOSIS — R600 Localized edema: Secondary | ICD-10-CM

## 2020-12-12 DIAGNOSIS — I480 Paroxysmal atrial fibrillation: Secondary | ICD-10-CM

## 2020-12-12 DIAGNOSIS — I10 Essential (primary) hypertension: Secondary | ICD-10-CM

## 2020-12-12 NOTE — Telephone Encounter (Signed)
Patient calling with BP readings:   Tuesday 5/3 9am: 143/69 64,  Wednesday 5/4 1:44 pm: 117/78/72 Thursday 5/5 3pm: 123/61/77 Friday 5/6 3:30pm :155/74/74 Saturday 5/7 5pm: 142/72/72 Sunday 5/8 9:30 am: 156/78/78 Monday 5/9 2:15 pm: 164/77/71 Tuesday 5/10 8:07 am: 171/81/81 Wednesday 5/11  2:15 pm: 143/69/78 Thursday 5/12 10am: 138/85/128 Friday 5/13 6:45 am: 150/83/82 Saturday 5/14 2:35 pm: 140/80/88 Sun 5/15 9:45 am: 138/72/91 Today noon: 161/80/82

## 2020-12-12 NOTE — Telephone Encounter (Signed)
BP elevated, recommend increase losartan to 50 mg daily and check BMET in 1 week

## 2020-12-14 MED ORDER — FUROSEMIDE 20 MG PO TABS: 20.0000 mg | ORAL_TABLET | Freq: Every day | ORAL | 3 refills | Status: DC

## 2020-12-14 NOTE — Telephone Encounter (Signed)
Spoke to patient-patient aware of recommendations.  She will increase losartan to 50 mg daily.   She was coming in tomorrow for labs due to last increase in Losartan.   Advised will confirm with Dr. Gardiner Rhyme timing of labs.  BP this AM before meds and food 176/97 HR 87.    Patient also reports since last appt with Afib clinic on 5/13, she has noticed increased LE swelling.   She states she feels "bloated" in her abdomen as well.   States he abdomen "hanging over her pants" which is unusual for her.   She weighed while on the phone and weighed 150 lbs.  She is unsure what her last weight or previous weight was (145 and 152 and OV).   Denies any SOB.   She reports eating 2 small pieces of pizza yesterday but was swelling before this.  She states she was on a "fluid pill" previously but this was stopped.    Per chart review:   On Lasix previously-d/c due to low k in setting of V/D.    Advised will discuss with a MD and return call.

## 2020-12-14 NOTE — Addendum Note (Signed)
Addended by: Patria Mane A on: 12/14/2020 05:09 PM   Modules accepted: Orders

## 2020-12-14 NOTE — Addendum Note (Signed)
Encounter addended by: Oliver Barre, PA on: 12/14/2020 11:27 AM  Actions taken: Clinical Note Signed

## 2020-12-14 NOTE — Telephone Encounter (Signed)
Patient called to afib clinic in regards to continued swelling in abdomen. Discussed with Adline Peals PA will resume her lasix 20mg  once a day. Pt in agreement with plan. Will plan to have BMET drawn per Dr. Gardiner Rhyme recommendations next week. Pt will call if abdominal swelling does not improve with addition of lasix.

## 2020-12-14 NOTE — Telephone Encounter (Signed)
Per Dr. Lonzo Cloud labs in 1 week.    Left detailed message (ok per DPR) with recommendations.  Will attempt to call again tomorrow to confirm message received.

## 2020-12-15 MED ORDER — LOSARTAN POTASSIUM 25 MG PO TABS: 50.0000 mg | ORAL_TABLET | Freq: Every day | ORAL | 3 refills | Status: DC

## 2020-12-15 NOTE — Addendum Note (Signed)
Addended by: Patria Mane A on: 12/15/2020 04:50 PM   Modules accepted: Orders

## 2020-12-15 NOTE — Telephone Encounter (Signed)
Spoke to patient-message received.  She will come in for labs next Wednesday.

## 2020-12-15 NOTE — Telephone Encounter (Signed)
Left message to call back to confirm message received.

## 2020-12-19 ENCOUNTER — Ambulatory Visit: Payer: Medicare Other | Admitting: Adult Health Nurse Practitioner

## 2020-12-20 ENCOUNTER — Telehealth: Payer: Self-pay | Admitting: Cardiology

## 2020-12-20 NOTE — Telephone Encounter (Signed)
Returned call patient, who states that she got confused about her medication and thought that she was told recently to increase her Losartan to 50mg  Daily. Patient states she has been taking 50mg  but just wanted to double check to be safe. Advised patient that per her chart and recent phone note from 5/19 she was to take 50mg  daily and come in tomorrow for repeat blood work. Patient states that she will be here tomorrow for her labs and continue with the 50mg  of Losartan daily. Advised patient that I would forward message to make Dr. Gardiner Rhyme aware.   Advised patient to call back to office with any issues, questions, or concerns. Patient verbalized understanding.

## 2020-12-20 NOTE — Telephone Encounter (Signed)
Pt c/o medication issue: 1. Name of Medication: losartan  2. How are you currently taking this medication (dosage and times per day)? 1 a day  3. Are you having a reaction (difficulty breathing--STAT)?  No  4. What is your medication issue? Patient think she is taking this medication wrong

## 2020-12-21 ENCOUNTER — Ambulatory Visit: Payer: Medicare Other | Admitting: Internal Medicine

## 2020-12-21 LAB — BASIC METABOLIC PANEL
BUN/Creatinine Ratio: 16 (ref 12–28)
BUN: 13 mg/dL (ref 8–27)
CO2: 27 mmol/L (ref 20–29)
Calcium: 9.2 mg/dL (ref 8.7–10.3)
Chloride: 99 mmol/L (ref 96–106)
Creatinine, Ser: 0.81 mg/dL (ref 0.57–1.00)
Glucose: 103 mg/dL — ABNORMAL HIGH (ref 65–99)
Potassium: 4.2 mmol/L (ref 3.5–5.2)
Sodium: 141 mmol/L (ref 134–144)
eGFR: 72 mL/min/{1.73_m2} (ref >59)

## 2020-12-21 LAB — MAGNESIUM: Magnesium: 1.9 mg/dL (ref 1.6–2.3)

## 2020-12-22 ENCOUNTER — Telehealth: Payer: Self-pay | Admitting: Cardiology

## 2020-12-22 NOTE — Telephone Encounter (Signed)
Patient was returning call 

## 2020-12-22 NOTE — Telephone Encounter (Signed)
New Message:     Please all Ruth Delgado, she have abnormal results

## 2020-12-22 NOTE — Telephone Encounter (Signed)
Spoke to [facility] calling to report on 12/08/20 at 12:12 pm rapid Afib 186. Called patient left message on personal voice mail to call back.

## 2020-12-22 NOTE — Telephone Encounter (Signed)
Spoke to patient stated while she wore monitor she was not aware of any fast heart beat.Stated she feels fine.Advised to keep echo appointment scheduled 12/28/20 at 11:30 am.Keep appointment scheduled with Afib clinic 01/03/21 at 10:30 am.I will make Dr.Schumann aware.

## 2020-12-28 ENCOUNTER — Other Ambulatory Visit: Payer: Self-pay | Admitting: Internal Medicine

## 2020-12-28 ENCOUNTER — Ambulatory Visit (HOSPITAL_COMMUNITY): Payer: Medicare Other | Attending: Internal Medicine

## 2020-12-28 ENCOUNTER — Other Ambulatory Visit: Payer: Self-pay

## 2020-12-28 ENCOUNTER — Telehealth: Payer: Self-pay | Admitting: *Deleted

## 2020-12-28 DIAGNOSIS — I4891 Unspecified atrial fibrillation: Secondary | ICD-10-CM | POA: Insufficient documentation

## 2020-12-28 LAB — ECHOCARDIOGRAM COMPLETE
Area-P 1/2: 4.12 cm2
S' Lateral: 2.7 cm

## 2020-12-28 NOTE — Telephone Encounter (Signed)
Spoke with pt, aware of the recommendations.  

## 2020-12-28 NOTE — Telephone Encounter (Signed)
Patient here for echo and told technician she had a rash and was wondering if it could be related to medications. Patient has several small red bumps under left breast. It is itchy.  States she also has 3 similar spots in her groin area.  First noticed about 3 days ago.  Has been applying triamcinolone cream and this is helping "dry it up". No new laundry detergent or soap.  Uses dye free detergent due to history of allergies.  Reports most recent new medication is losartan.

## 2020-12-28 NOTE — Telephone Encounter (Signed)
It sounds like it could be more of a heat rash based on where she is reporting it.  Would expect a drug rash to be more across the chest and torso.

## 2021-01-03 ENCOUNTER — Other Ambulatory Visit: Payer: Self-pay

## 2021-01-03 ENCOUNTER — Ambulatory Visit (HOSPITAL_COMMUNITY)
Admission: RE | Admit: 2021-01-03 | Discharge: 2021-01-03 | Disposition: A | Discharge status: Home / Self Care | Payer: Medicare Other | Source: Ambulatory Visit | Attending: Physician Assistant | Admitting: Physician Assistant

## 2021-01-03 ENCOUNTER — Encounter (HOSPITAL_COMMUNITY): Payer: Self-pay | Admitting: Physician Assistant

## 2021-01-03 VITALS — BP 138/70 | HR 74 | Ht 64.5 in | Wt 152.2 lb

## 2021-01-03 DIAGNOSIS — Z8249 Family history of ischemic heart disease and other diseases of the circulatory system: Secondary | ICD-10-CM | POA: Diagnosis not present

## 2021-01-03 DIAGNOSIS — I480 Paroxysmal atrial fibrillation: Secondary | ICD-10-CM | POA: Insufficient documentation

## 2021-01-03 DIAGNOSIS — Z79899 Other long term (current) drug therapy: Secondary | ICD-10-CM | POA: Insufficient documentation

## 2021-01-03 DIAGNOSIS — Z87891 Personal history of nicotine dependence: Secondary | ICD-10-CM | POA: Diagnosis not present

## 2021-01-03 DIAGNOSIS — D6869 Other thrombophilia: Secondary | ICD-10-CM | POA: Insufficient documentation

## 2021-01-03 DIAGNOSIS — I10 Essential (primary) hypertension: Secondary | ICD-10-CM | POA: Diagnosis not present

## 2021-01-03 DIAGNOSIS — Z7901 Long term (current) use of anticoagulants: Secondary | ICD-10-CM | POA: Diagnosis not present

## 2021-01-03 DIAGNOSIS — E118 Type 2 diabetes mellitus with unspecified complications: Secondary | ICD-10-CM | POA: Insufficient documentation

## 2021-01-03 MED ORDER — DILTIAZEM HCL ER COATED BEADS 360 MG PO CP24: 360.0000 mg | ORAL_CAPSULE | Freq: Every day | ORAL | 1 refills | Status: DC

## 2021-01-03 NOTE — Patient Instructions (Signed)
Increase cardizem to 360mg  once a day

## 2021-01-03 NOTE — Progress Notes (Signed)
Primary Care Physician: Unk Pinto, MD Primary Cardiologist: Dr Gardiner Rhyme  Primary Electrophysiologist: none Referring Physician: Dr Grace Blight is a 86 y.o. female with a history of T2DM, hypertension, hyperlipidemia, gout , atrial fibrillation who [facility]. The patient was initially diagnosed with atrial fibrillation remotely. More recently she presented to the ED with an acute GI illness. ECG showed afib with RVR. She converted to SR spontaneously. Patient is on Eliquis for a CHADS2VASC score of 5. She was seen by Dr Gardiner Rhyme 11/28/20 and a Zio monitor was placed to evaluate arrhythmia burden. On 12/08/20 she called the clinic with elevated heart rates. She reports the episode lasted about one hour.   On follow up today, patient reports that she has done well since her last visit. The cardiac monitor showed 4% afib burden with RVR. Symptomatically, she does feel less palpitations since increasing diltiazem. She also complains of a rash on her left side just inferior to her breast which has been itching and "burning".   Today, she denies symptoms of palpitations, chest pain, shortness of breath, orthopnea, PND, lower extremity edema, dizziness, presyncope, syncope, snoring, daytime somnolence, bleeding, or neurologic sequela. The patient is tolerating medications without difficulties and is otherwise without complaint today.    Atrial Fibrillation Risk Factors:  she does not have symptoms or diagnosis of sleep apnea. she does not have a history of rheumatic fever.   she has a BMI of Body mass index is 25.72 kg/m.Marland Kitchen Filed Weights   01/03/21 1023  Weight: 69 kg    Family History  Problem Relation Age of Onset  . Diabetes Mother        suicide at age 62  . Hypertension Father   . CVA Father      Atrial Fibrillation Management history:  Previous antiarrhythmic drugs: none Previous cardioversions:  none Previous ablations: none CHADS2VASC score: 5 Anticoagulation history: Eliquis   Past Medical History:  Diagnosis Date  . A-fib ([place])   . Anxiety   . Arthritis   . Diabetes mellitus   . Gout   . Hyperlipemia   . Hypertension    Past Surgical History:  Procedure Laterality Date  . ABDOMINAL HYSTERECTOMY    . BIOPSY BREAST     (L) BREAST IN 1993  . KNEE ARTHROSCOPY      Current Outpatient Medications  Medication Sig Dispense Refill  . acetaminophen (TYLENOL) 650 MG CR tablet Take 650 mg by mouth every 8 (eight) hours as needed for pain.    Marland Kitchen allopurinol (ZYLOPRIM) 300 MG tablet TAKE 1 TABLET BY MOUTH DAILY TO PREVENT GOUT 90 tablet 3  . apixaban (ELIQUIS) 5 MG TABS tablet Take 1 tablet (5 mg total) by mouth 2 (two) times daily. 180 tablet 0  . cholecalciferol (VITAMIN D) 1000 UNITS tablet Take 5,000 Units by mouth every other day.     . citalopram (CELEXA) 20 MG tablet TAKE 1 TABLET DAILY FOR MOOD 90 tablet 3  . diltiazem (CARDIZEM CD) 300 MG 24 hr capsule Take 1 capsule (300 mg total) by mouth daily. 30 capsule 1  . furosemide (LASIX) 20 MG tablet Take 1 tablet (20 mg total) by mouth daily. 30 tablet 3  . Lancets (FREESTYLE) lancets   12  . losartan (COZAAR) 25 MG tablet Take 2 tablets (50 mg total) by mouth daily. 90 tablet 3  . pravastatin (PRAVACHOL) 40 MG tablet Take  1 tablet  at Bedtime  for Cholesterol (Patient taking differently: No sig reported) 90 tablet 0  . Simethicone (GAS-X PO) Take 2 tablets by mouth daily as needed (bloating).    . triamcinolone cream (KENALOG) 0.1 % Apply 1 application topically daily as needed (itching).  2  . potassium chloride SA (KLOR-CON) 20 MEQ tablet Take 1 tablet (20 mEq total) by mouth daily for 5 days. 5 tablet 0   No current facility-administered medications for this encounter.    Allergies  Allergen Reactions  . Buspar [Buspirone]     dysphoria  . Lipitor [Atorvastatin]     Myalgias  . Metformin And Related      Gas/bloating  . Nsaids     GI upset  . Sulfa Antibiotics Other (See Comments)    Reaction=throat itching  . Sulfonamide Derivatives     Social History   Socioeconomic History  . Marital status: Married    Spouse name: Not on file  . Number of children: Not on file  . Years of education: Not on file  . Highest education level: Not on file  Occupational History  . Occupation: retired  Tobacco Use  . Smoking status: Former Smoker    Packs/day: 0.50    Years: 25.00    Pack years: 12.50    Quit date: 07/30/2006    Years since quitting: 14.4  . Smokeless tobacco: Never Used  Substance and Sexual Activity  . Alcohol use: Not Currently    Alcohol/week: 2.0 standard drinks    Types: 2 Standard drinks or equivalent per week    Comment: occasional, none in 3 months  . Drug use: Never  . Sexual activity: Not on file  Other Topics Concern  . Not on file  Social History Narrative  . Not on file   Social Determinants of Health   Financial Resource Strain: Not on file  Food Insecurity: Not on file  Transportation Needs: Not on file  Physical Activity: Not on file  Stress: Not on file  Social Connections: Not on file  Intimate Partner Violence: Not on file     ROS- All systems are reviewed and negative except as per the HPI above.  Physical Exam: Vitals:   01/03/21 1023  BP: 138/70  Pulse: 74  Weight: 69 kg  Height: 5' 4.5" (1.638 m)    GEN- The patient is a well appearing elderly female, alert and oriented x 3 today.   HEENT-head normocephalic, atraumatic, sclera clear, conjunctiva pink, hearing intact, trachea midline. Lungs- Clear to ausculation bilaterally, normal work of breathing Heart- Regular rate and rhythm, no murmurs, rubs or gallops  GI- soft, NT, ND, + BS Extremities- no clubbing, cyanosis, or edema MS- no significant deformity or atrophy Skin- patchy erythema left side with crusted vesicles  Psych- euthymic mood, full affect Neuro- strength and  sensation are intact   Wt Readings from Last 3 Encounters:  01/03/21 69 kg  12/09/20 68.9 kg  12/01/20 70.2 kg    EKG today demonstrates  SR Vent. rate 74 BPM PR interval 150 ms QRS duration 80 ms QT/QTcB 372/412 ms  Echo 12/28/20 demonstrated  1. Left ventricular ejection fraction, by estimation, is 65 to 70%. The  left ventricle has normal function. The left ventricle has no regional  wall motion abnormalities. There is mild concentric left ventricular  hypertrophy. Left ventricular diastolic  parameters are indeterminate.  2. Right ventricular systolic function is normal. The right ventricular  size is normal. There is normal pulmonary artery systolic  pressure. The  estimated right ventricular systolic pressure is 02.1 mmHg.  3. The mitral valve is degenerative. No evidence of mitral valve  regurgitation. No evidence of mitral stenosis.  4. The aortic valve is tricuspid. Aortic valve regurgitation is not  visualized. No aortic stenosis is present.  Epic records are reviewed at length today  CHA2DS2-VASc Score = 5  The patient's score is based upon: CHF History: No HTN History: Yes Diabetes History: Yes Stroke History: No Vascular Disease History: No Age Score: 2 Gender Score: 1      ASSESSMENT AND PLAN: 1. Paroxysmal Atrial Fibrillation (ICD10:  I48.0) The patient's CHA2DS2-VASc score is 5, indicating a 7.2% annual risk of stroke.   Cardiac monitor showed 4% afib burden with RVR. We discussed therapeutic options today including increasing diltiazem and AADs (Multaq, flecainide, dofetilide, amiodarone) For now, she would like to see how she does on the higher dose of diltiazem before considering AAD. Increase diltiazem to 360 mg daily. Continue Eliquis 5 mg BID  2. Secondary Hypercoagulable State (ICD10:  D68.69) The patient is at significant risk for stroke/thromboembolism based upon her CHA2DS2-VASc Score of 5.  Continue Apixaban (Eliquis).   3.  HTN Stable, no changes today.   Follow up with Dr Gardiner Rhyme as scheduled. AF clinic in 6 months, sooner if needed for AAD.    [facility] [address] [place], [place] [address] [phone] 01/03/2021 10:50 AM

## 2021-01-05 ENCOUNTER — Other Ambulatory Visit: Payer: Self-pay

## 2021-01-05 ENCOUNTER — Ambulatory Visit: Payer: Medicare Other | Attending: Internal Medicine | Admitting: Internal Medicine

## 2021-01-05 ENCOUNTER — Encounter: Payer: Self-pay | Admitting: Internal Medicine

## 2021-01-05 VITALS — BP 160/70 | HR 85 | Resp 16 | Ht 64.5 in | Wt 150.0 lb

## 2021-01-05 DIAGNOSIS — B356 Tinea cruris: Secondary | ICD-10-CM

## 2021-01-05 DIAGNOSIS — E1169 Type 2 diabetes mellitus with other specified complication: Secondary | ICD-10-CM | POA: Diagnosis not present

## 2021-01-05 DIAGNOSIS — I152 Hypertension secondary to endocrine disorders: Secondary | ICD-10-CM

## 2021-01-05 DIAGNOSIS — Z8659 Personal history of other mental and behavioral disorders: Secondary | ICD-10-CM

## 2021-01-05 DIAGNOSIS — I480 Paroxysmal atrial fibrillation: Secondary | ICD-10-CM | POA: Diagnosis not present

## 2021-01-05 DIAGNOSIS — E1159 Type 2 diabetes mellitus with other circulatory complications: Secondary | ICD-10-CM

## 2021-01-05 DIAGNOSIS — Z7689 Persons encountering health services in other specified circumstances: Secondary | ICD-10-CM

## 2021-01-05 DIAGNOSIS — B029 Zoster without complications: Secondary | ICD-10-CM | POA: Diagnosis not present

## 2021-01-05 DIAGNOSIS — Z8739 Personal history of other diseases of the musculoskeletal system and connective tissue: Secondary | ICD-10-CM

## 2021-01-05 DIAGNOSIS — E785 Hyperlipidemia, unspecified: Secondary | ICD-10-CM

## 2021-01-05 MED ORDER — PRAVASTATIN SODIUM 40 MG PO TABS: ORAL_TABLET | ORAL | 0 refills | Status: DC

## 2021-01-05 MED ORDER — APIXABAN 5 MG PO TABS: 5.0000 mg | ORAL_TABLET | Freq: Two times a day (BID) | ORAL | 1 refills | Status: DC

## 2021-01-05 MED ORDER — ALLOPURINOL 300 MG PO TABS: ORAL_TABLET | ORAL | 3 refills | Status: DC

## 2021-01-05 MED ORDER — NYSTATIN-TRIAMCINOLONE 100000-0.1 UNIT/GM-% EX OINT: 1.0000 "application " | TOPICAL_OINTMENT | Freq: Two times a day (BID) | CUTANEOUS | 0 refills | Status: DC

## 2021-01-05 NOTE — Progress Notes (Signed)
Pt states she has shingles

## 2021-01-05 NOTE — Progress Notes (Addendum)
Patient ID: ALYSSABETH BRUSTER, female    DOB: 09/17/1934  MRN: [id number]  CC: Hospitalization Follow-up, Herpes Zoster (possible), and Medication Refill   Subjective: Ruth Delgado is a 86 y.o. female who presents for new patient visit to establish care. Her concerns today include:  Pt with hx of HTN, HL, PAF on anticoagulation, DM type 2, GERD, vit D def, DVT RT leg, gout, depression  Previous PCP is Dr. Margie Ege and she decided to change after seeing this physician for 40 years because lately she did not feel that she had decision-making was occurring.  Hospitalized in April of this year with Salmonella gastroenteritis from which she fully recovered.  Striae of diabetes for which she is followed by endocrinologist Dr. Chalmers Cater.  She is off medications and diet controlled.  She checks blood sugars about 2-3 times a week with range being from 100-120.  A1c was 6.6 in February of this year.  Diagnosed with atrial fibrillation in April of this year while in hospital.  She had follow-up Holter monitor through cardiology that showed A. fib/a flutter.  CHA2DS2-VASc score is 5.  She is on Eliquis.  She notes some intermittent bruising on the arms.  She has not had any falls in the past 6 months.  She ambulates independently.  Denies any palpitations, chest pains or shortness of breath.  Diagnosed with DVT in the right leg recently on visit with vascular for varicose veins.  She does have some swelling in the right leg for which she wears compression socks.  Blood pressure noted to be elevated today.  Patient thinks it is due to her being a bit nervous.  She checks blood pressure every day.  Gives range of 130-140/60-70.  Compliant with taking medications.  Currently on diltiazem, Cozaar and furosemide.  I note that she is on Celexa.  Patient states she has been on this for about 20 years.  Started on it for depression when she loss her first husband. I note that she is on allopurinol.  She  gives history of gout.  Complains of a rash under the left breast that has been there for the past 1 week.  No pain at this time.  Thinks it is shingles.  Reports having a shingles vaccine several years ago but she is not sure whether it was the old vaccine versus Shingrix.   Also reports an itchy rash on the upper inner thigh bilaterally which she has had for several weeks.    Patient Active Problem List   Diagnosis Date Noted   Paroxysmal atrial fibrillation ([place]) 12/09/2020   Secondary hypercoagulable state ([place]) 12/09/2020   Atrial fibrillation with RVR ([place]) 11/15/2020   Nausea, vomiting and diarrhea 11/15/2020   CKD stage 2 due to type 2 diabetes mellitus ([place]) 03/06/2018   Overweight (BMI 25.0-29.9) 03/06/2018   Osteopenia 03/06/2018   Esophageal reflux 09/06/2015   Vitamin D deficiency 06/08/2013   Medication management 06/08/2013   Hypertension    Hyperlipemia    Gout    Type 2 diabetes mellitus with stage 3 chronic kidney disease, without long-term current use of insulin ([place])      Current Outpatient Medications on File Prior to Visit  Medication Sig Dispense Refill   acetaminophen (TYLENOL) 650 MG CR tablet Take 650 mg by mouth every 8 (eight) hours as needed for pain.     cholecalciferol (VITAMIN D) 1000 UNITS tablet Take 5,000 Units by mouth every other day.      citalopram (  CELEXA) 20 MG tablet TAKE 1 TABLET DAILY FOR MOOD 90 tablet 3   diltiazem (CARDIZEM CD) 360 MG 24 hr capsule Take 1 capsule (360 mg total) by mouth daily. 30 capsule 1   furosemide (LASIX) 20 MG tablet Take 1 tablet (20 mg total) by mouth daily. 30 tablet 3   Lancets (FREESTYLE) lancets   12   losartan (COZAAR) 25 MG tablet Take 2 tablets (50 mg total) by mouth daily. 90 tablet 3   Simethicone (GAS-X PO) Take 2 tablets by mouth daily as needed (bloating).     triamcinolone cream (KENALOG) 0.1 % Apply 1 application topically daily as needed (itching).  2   No current facility-administered  medications on file prior to visit.    Allergies  Allergen Reactions   Buspar [Buspirone]     dysphoria   Lipitor [Atorvastatin]     Myalgias   Metformin And Related     Gas/bloating   Nsaids     GI upset   Sulfa Antibiotics Other (See Comments)    Reaction=throat itching   Sulfonamide Derivatives     Social History   Socioeconomic History   Marital status: Widowed    Spouse name: Not on file   Number of children: Not on file   Years of education: Not on file   Highest education level: Not on file  Occupational History   Occupation: retired  Tobacco Use   Smoking status: Former    Packs/day: 0.50    Years: 25.00    Pack years: 12.50    Types: Cigarettes    Quit date: 07/30/2006    Years since quitting: 14.4   Smokeless tobacco: Never  Substance and Sexual Activity   Alcohol use: Not Currently    Alcohol/week: 2.0 standard drinks    Types: 2 Standard drinks or equivalent per week    Comment: occasional, none in 3 months   Drug use: Never   Sexual activity: Not on file  Other Topics Concern   Not on file  Social History Narrative   Not on file   Social Determinants of Health   Financial Resource Strain: Not on file  Food Insecurity: Not on file  Transportation Needs: Not on file  Physical Activity: Not on file  Stress: Not on file  Social Connections: Not on file  Intimate Partner Violence: Not on file    Family History  Problem Relation Age of Onset   Diabetes Mother        suicide at age 23   Hypertension Father    CVA Father     Past Surgical History:  Procedure Laterality Date   ABDOMINAL HYSTERECTOMY     BIOPSY BREAST     (L) BREAST IN 1993   KNEE ARTHROSCOPY      ROS: Review of Systems Negative except as stated above  PHYSICAL EXAM: BP (!) 160/70   Pulse 85   Resp 16   Ht 5' 4.5" (1.638 m)   Wt 150 lb (68 kg)   SpO2 95%   BMI 25.35 kg/m   Wt Readings from Last 3 Encounters:  01/05/21 150 lb (68 kg)  01/03/21 152 lb 3.2 oz  (69 kg)  12/09/20 152 lb (68.9 kg)    Physical Exam  General appearance - alert, well appearing, thin elderly female and in no distress Mental status - normal mood, behavior, speech, dress, motor activity, and thought processes Eyes - pupils equal and reactive, extraocular eye movements intact Nose - normal and patent,  no erythema, discharge or polyps Mouth - mucous membranes moist, pharynx normal without lesions Neck - supple, no significant adenopathy Chest - clear to auscultation, no wheezes, rales or rhonchi, symmetric air entry Heart - normal rate, regular rhythm, normal S1, S2, no murmurs, rubs, clicks or gallops Extremities -mild to moderate nonpitting edema of the right lower extremity.  No edema in the left lower extremity. Skin: Erythematous pustular rash with some crusting in areas located below the left breast in a dermatomal pattern. Erythematous rash on the upper inner thigh of the groin area bilaterally. She has a few small spots of old ecchymosis on the left forearm. Depression screen [facility] 2/9 01/05/2021 11/30/2020 05/30/2020  Decreased Interest 0 0 0  Down, Depressed, Hopeless 1 0 0  PHQ - 2 Score 1 0 0     Depression screen [facility] 2/9 01/05/2021 11/30/2020 05/30/2020  Decreased Interest 0 0 0  Down, Depressed, Hopeless 1 0 0  PHQ - 2 Score 1 0 0    CMP Latest Ref Rng & Units 12/21/2020 12/01/2020 11/28/2020  Glucose 65 - 99 mg/dL 103(H) 78 97  BUN 8 - 27 mg/dL 13 16 15   Creatinine 0.57 - 1.00 mg/dL 0.81 0.80 0.80  Sodium 134 - 144 mmol/L 141 141 142  Potassium 3.5 - 5.2 mmol/L 4.2 5.0 4.2  Chloride 96 - 106 mmol/L 99 104 102  CO2 20 - 29 mmol/L 27 30 25   Calcium 8.7 - 10.3 mg/dL 9.2 9.6 9.3  Total Protein 6.1 - 8.1 g/dL - 6.5 6.6  Total Bilirubin 0.2 - 1.2 mg/dL - 0.5 0.4  Alkaline Phos 44 - 121 IU/L - - 48  AST 10 - 35 U/L - 14 17  ALT 6 - 29 U/L - 13 13   Lipid Panel     Component Value Date/Time   CHOL 179 09/02/2020 1035   TRIG 142 09/02/2020 1035   HDL 56  09/02/2020 1035   CHOLHDL 3.2 09/02/2020 1035   VLDL 28 01/14/2017 1114   LDLCALC 99 09/02/2020 1035    CBC    Component Value Date/Time   WBC 7.4 12/01/2020 1615   RBC 3.88 12/01/2020 1615   HGB 12.4 12/01/2020 1615   HGB 13.2 11/28/2020 1229   HCT 38.3 12/01/2020 1615   HCT 37.7 11/28/2020 1229   PLT 277 12/01/2020 1615   PLT 274 11/28/2020 1229   MCV 98.7 12/01/2020 1615   MCV 97 11/28/2020 1229   MCH 32.0 12/01/2020 1615   MCHC 32.4 12/01/2020 1615   RDW 13.6 12/01/2020 1615   RDW 13.5 11/28/2020 1229   LYMPHSABS 2,768 12/01/2020 1615   MONOABS 0.6 11/18/2020 0621   EOSABS 104 12/01/2020 1615   BASOSABS 89 12/01/2020 1615    ASSESSMENT AND PLAN: 1. Establishing care with new doctor, encounter for   2. Hypertension associated with diabetes ([place]) Not at goal.  However reported home blood pressure readings are acceptable.  She will continue current medications and low-salt diet.  Continue to check blood pressure regularly with goal being 130/80 or lower. Diabetes followed by her endocrinologist and is diet controlled.  Encourage healthy eating habits. - Ambulatory referral to Ophthalmology  3. Hyperlipidemia associated with type 2 diabetes mellitus (H[facility]) - pravastatin (PRAVACHOL) 40 MG tablet; Take  1 tablet  at Bedtime  for Cholesterol  Dispense: 90 tablet; Refill: 0  4. PAF (paroxysmal atrial fibrillation) ([place]) Followed by cardiologist Dr. Gardiner Rhyme Continue current medications - apixaban (ELIQUIS) 5 MG TABS tablet; Take 1 tablet (  5 mg total) by mouth 2 (two) times daily.  Dispense: 180 tablet; Refill: 1  5. Herpes zoster without complication Patient reports no pain.  She has had it for about a week.  I recommend she leave it alone and let it continue to heal on its own.  She will try to find out which shingles vaccine she received.  I suspect it may have been Zostavax.  Once she is healed over we can do the Shingrix series.  6. Tinea cruris -  nystatin-triamcinolone ointment (MYCOLOG); Apply 1 application topically 2 (two) times daily.  Dispense: 30 g; Refill: 0  7. History of gout - allopurinol (ZYLOPRIM) 300 MG tablet; TAKE 1 TABLET BY MOUTH DAILY TO PREVENT GOUT  Dispense: 90 tablet; Refill: 3  8. History of depression On Celexa x 20 yrs.  She feels she is doing well.  May discuss trying to wean at least to 10 mg in the near future.   Patient was given the opportunity to ask questions.  Patient verbalized understanding of the plan and was able to repeat key elements of the plan.   Orders Placed This Encounter  Procedures   Ambulatory referral to Ophthalmology     Requested Prescriptions   Signed Prescriptions Disp Refills   apixaban (ELIQUIS) 5 MG TABS tablet 180 tablet 1    Sig: Take 1 tablet (5 mg total) by mouth 2 (two) times daily.   pravastatin (PRAVACHOL) 40 MG tablet 90 tablet 0    Sig: Take  1 tablet  at Bedtime  for Cholesterol   allopurinol (ZYLOPRIM) 300 MG tablet 90 tablet 3    Sig: TAKE 1 TABLET BY MOUTH DAILY TO PREVENT GOUT   nystatin-triamcinolone ointment (MYCOLOG) 30 g 0    Sig: Apply 1 application topically 2 (two) times daily.    Return in about 3 months (around 04/07/2021).  Karle Plumber, MD, FACP

## 2021-01-05 NOTE — Patient Instructions (Signed)
Continue to check your blood pressure regularly.  The goal is 130/80 or lower.  If you find that your blood pressure is consistently higher than this, please call and let me know so that we can adjust your blood pressure medications.  I have sent a prescription for Mycolog ointment to use on the rash on your upper thigh.

## 2021-01-18 ENCOUNTER — Other Ambulatory Visit (HOSPITAL_COMMUNITY): Payer: Self-pay | Admitting: *Deleted

## 2021-01-18 MED ORDER — LOSARTAN POTASSIUM 50 MG PO TABS: 50.0000 mg | ORAL_TABLET | Freq: Every day | ORAL | 2 refills | Status: DC

## 2021-02-07 ENCOUNTER — Ambulatory Visit: Payer: Self-pay | Admitting: *Deleted

## 2021-02-07 NOTE — Telephone Encounter (Signed)
Reason for Disposition  [1] Caller has medicine question about med NOT prescribed by PCP AND [2] triager unable to answer question (e.g., compatibility with other med, storage)    Wants to know what medication for diarrhea she can take with all her medications.   She is going to call her pharmacist.  Answer Assessment - Initial Assessment Questions 1. NAME of MEDICATION: "What medicine are you calling about?"     Eliquis I'm taking.    I'm having diarrhea so didn't know what I could take with my medications.  I get diarrhea on occasion.    I'm eating a lot veggies and fruits from my garden and that's when I noticed I started having diarrhea.    I was fine until all my veggies started coming in and I'm eating a lot of fresh stuff from my Garden.   I had a lot of cantaloupe yesterday, it was so good and I know it can give you diarrhea if you eat too much. 2. QUESTION: "What is your question?" (e.g., double dose of medicine, side effect)     What medication can I take for diarrhea with my medications.   I'm on Eliquis and was told not to take Pepto Bismol with it and that's what I used to take.   When I was in the hospital a while back they found out I have A. Fib so I'm on new medications as well as the Eliquis. 3. PRESCRIBING HCP: "Who prescribed it?" Reason: if prescribed by specialist, call should be referred to that group.     Dr. Karle Plumber.   I got established with her as my new dr not long ago. 4. SYMPTOMS: "Do you have any symptoms?"     Diarrhea that I've noticed since eating a lot of fresh vegetables from my garden lately. 5. SEVERITY: If symptoms are present, ask "Are they mild, moderate or severe?"     Mild 6. PREGNANCY:  "Is there any chance that you are pregnant?" "When was your last menstrual period?"     N/A due to age  Protocols used: Medication Question Call-A-AH

## 2021-02-07 NOTE — Telephone Encounter (Signed)
Pt called in wanting to know what OTC medication she could take for diarrhea that is compatible with all the medications she is on.  She has decided to call her pharmacist.    "I didn't think of that".    "He has all my medications so he knows what I'm taking".    I let her know that was the best person to talk with regarding interactions of medications.

## 2021-02-13 ENCOUNTER — Encounter: Payer: Medicare Other | Admitting: Internal Medicine

## 2021-02-14 ENCOUNTER — Other Ambulatory Visit: Payer: Self-pay | Admitting: Internal Medicine

## 2021-02-14 DIAGNOSIS — E785 Hyperlipidemia, unspecified: Secondary | ICD-10-CM

## 2021-02-14 DIAGNOSIS — I480 Paroxysmal atrial fibrillation: Secondary | ICD-10-CM

## 2021-02-14 DIAGNOSIS — E1169 Type 2 diabetes mellitus with other specified complication: Secondary | ICD-10-CM

## 2021-02-14 NOTE — Telephone Encounter (Signed)
Medication Refill - Medication: apixaban (ELIQUIS) 5 MG TABS tablet, pravastatin (PRAVACHOL) 40 MG tablet  Has the patient contacted their pharmacy? Yes.    (Agent: If yes, when and what did the pharmacy advise?) Contact PCP since patient is running low on medication   Preferred Pharmacy (with phone number or street name):  [place], [address] [place] Phone:  [phone]  Fax:  [phone]      Agent: Please be advised that RX refills may take up to 3 business days. We ask that you follow-up with your pharmacy.

## 2021-02-15 ENCOUNTER — Other Ambulatory Visit: Payer: Self-pay | Admitting: Internal Medicine

## 2021-02-15 DIAGNOSIS — E1169 Type 2 diabetes mellitus with other specified complication: Secondary | ICD-10-CM

## 2021-02-28 ENCOUNTER — Other Ambulatory Visit: Payer: Self-pay

## 2021-02-28 ENCOUNTER — Other Ambulatory Visit (HOSPITAL_COMMUNITY): Payer: Self-pay | Admitting: Physician Assistant

## 2021-02-28 ENCOUNTER — Encounter: Payer: Self-pay | Admitting: Cardiology

## 2021-02-28 ENCOUNTER — Ambulatory Visit: Payer: Medicare Other | Admitting: Cardiology

## 2021-02-28 VITALS — BP 128/60 | HR 67 | Ht 65.0 in | Wt 149.0 lb

## 2021-02-28 DIAGNOSIS — E785 Hyperlipidemia, unspecified: Secondary | ICD-10-CM | POA: Diagnosis not present

## 2021-02-28 DIAGNOSIS — E876 Hypokalemia: Secondary | ICD-10-CM | POA: Diagnosis not present

## 2021-02-28 DIAGNOSIS — I10 Essential (primary) hypertension: Secondary | ICD-10-CM

## 2021-02-28 DIAGNOSIS — I480 Paroxysmal atrial fibrillation: Secondary | ICD-10-CM

## 2021-02-28 MED ORDER — DILTIAZEM HCL ER COATED BEADS 360 MG PO CP24: 360.0000 mg | ORAL_CAPSULE | Freq: Every day | ORAL | 5 refills | Status: DC

## 2021-02-28 NOTE — Patient Instructions (Signed)
Medication Instructions:  No Changes In Medications at this time.  *If you need a refill on your cardiac medications before your next appointment, please call your pharmacy*  Lab Work: [facility], [place]  If you have labs (blood work) drawn today and your tests are completely normal, you will receive your results only by: [place] (if you have MyChart) OR A paper copy in the mail If you have any lab test that is abnormal or we need to change your treatment, we will call you to review the results.  Follow-Up: At [facility], you and your health needs are our priority.  As part of our continuing mission to provide you with exceptional heart care, we have created designated Provider Care Teams.  These Care Teams include your primary Cardiologist (physician) and Advanced Practice Providers (APPs -  Physician Assistants and Nurse Practitioners) who all work together to provide you with the care you need, when you need it.  We recommend signing up for the patient portal called "MyChart".  Sign up information is provided on this After Visit Summary.  MyChart is used to connect with patients for Virtual Visits (Telemedicine).  Patients are able to view lab/test results, encounter notes, upcoming appointments, etc.  Non-urgent messages can be sent to your provider as well.   To learn more about what you can do with MyChart, go to [email or web address].    Your next appointment:   6 month(s)  The format for your next appointment:   In Person  Provider:   Oswaldo Milian, MD

## 2021-02-28 NOTE — Progress Notes (Signed)
Cardiology Office Note:    Date:  02/28/2021   ID:  Ruth Delgado, DOB 07-06-1935, MRN [id number]  PCP:  Ladell Pier, MD  Cardiologist:  None  Electrophysiologist:  None   Referring MD: Unk Pinto, MD   Chief Complaint  Patient presents with   Atrial Fibrillation    History of Present Illness:    Ruth Delgado is a 86 y.o. female with a hx of atrial fibrillation, T2DM, hypertension, hyperlipidemia, gout who is referred by Dr. Melford Aase for evaluation of atrial fibrillation.  She was admitted to [facility] from 4/19 through 11/18/2020.  She presented with sudden onset nausea/vomiting/diarrhea.  On the ED she went into A. fib with RVR and was started on diltiazem drip.  This was transitioned to p.o. diltiazem and she was started on Eliquis 5 mg twice daily.  Zio patch x2 weeks on 12/22/2020 showed AF burden 4%, average rate 150, with longest episode lasting 2 hours 46 minutes.  Echocardiogram 12/28/2020 showed normal biventricular function, no significant valvular disease.  Since last clinic visit,she reports that she has been doing well.  Did have an episode where she woke up with heart racing and BP was up to 150s.  Otherwise reports she has been having palpitations about 1-2 times per week but only last for about a second.  She has been taking her Eliquis, denies any blood in urine or stool.  Does report intermittent lower extremity edema.  She denies any lightheadedness, syncope, chest pain, or dyspnea.  Past Medical History:  Diagnosis Date   A-fib ([place])    Anxiety    Arthritis    Diabetes mellitus    Gout    Hyperlipemia    Hypertension     Past Surgical History:  Procedure Laterality Date   ABDOMINAL HYSTERECTOMY     BIOPSY BREAST     (L) BREAST IN 1993   KNEE ARTHROSCOPY      Current Medications: Current Meds  Medication Sig   acetaminophen (TYLENOL) 650 MG CR tablet Take 650 mg by mouth every 8 (eight) hours as needed for pain.   allopurinol (ZYLOPRIM) 300 MG  tablet TAKE 1 TABLET BY MOUTH DAILY TO PREVENT GOUT   apixaban (ELIQUIS) 5 MG TABS tablet Take 1 tablet (5 mg total) by mouth 2 (two) times daily.   cholecalciferol (VITAMIN D) 1000 UNITS tablet Take 5,000 Units by mouth every other day.    citalopram (CELEXA) 20 MG tablet TAKE 1 TABLET DAILY FOR MOOD   furosemide (LASIX) 20 MG tablet Take 1 tablet (20 mg total) by mouth daily.   Lancets (FREESTYLE) lancets    losartan (COZAAR) 50 MG tablet Take 1 tablet (50 mg total) by mouth daily.   nystatin-triamcinolone ointment (MYCOLOG) Apply 1 application topically 2 (two) times daily.   pravastatin (PRAVACHOL) 40 MG tablet Take  1 tablet  at Bedtime  for Cholesterol   Simethicone (GAS-X PO) Take 2 tablets by mouth daily as needed (bloating).   triamcinolone cream (KENALOG) 0.1 % Apply 1 application topically daily as needed (itching).   [DISCONTINUED] diltiazem (CARDIZEM CD) 360 MG 24 hr capsule Take 1 capsule (360 mg total) by mouth daily.     Allergies:   Buspar [buspirone], Lipitor [atorvastatin], Metformin and related, Nsaids, Sulfa antibiotics, and Sulfonamide derivatives   Social History   Socioeconomic History   Marital status: Widowed    Spouse name: Not on file   Number of children: Not on file   Years of education: Not on  file   Highest education level: Not on file  Occupational History   Occupation: retired  Tobacco Use   Smoking status: Former    Packs/day: 0.50    Years: 25.00    Pack years: 12.50    Types: Cigarettes    Quit date: 07/30/2006    Years since quitting: 14.5   Smokeless tobacco: Never  Substance and Sexual Activity   Alcohol use: Not Currently    Alcohol/week: 2.0 standard drinks    Types: 2 Standard drinks or equivalent per week    Comment: occasional, none in 3 months   Drug use: Never   Sexual activity: Not on file  Other Topics Concern   Not on file  Social History Narrative   Not on file   Social Determinants of Health   Financial Resource  Strain: Not on file  Food Insecurity: Not on file  Transportation Needs: Not on file  Physical Activity: Not on file  Stress: Not on file  Social Connections: Not on file     Family History: The patient's family history includes CVA in her father; Diabetes in her mother; Hypertension in her father.  ROS:   Please see the history of present illness. (+) Thrombus, right knee (+) LE edema (+) LE soreness (+) Palpitations (+) Right abdominal pain All other systems reviewed and are negative.  EKGs/Labs/Other Studies Reviewed:    The following studies were reviewed today:   EKG:   02/28/21:NSR, rate 67, No ST abnormalities 11/28/2020: Normal Sinus Rhythm. Rate 62 bpm. No ST abnormalities   Recent Labs: 11/28/2020: TSH 1.240 12/01/2020: ALT 13; Hemoglobin 12.4; Platelets 277 12/21/2020: BUN 13; Creatinine, Ser 0.81; Magnesium 1.9; Potassium 4.2; Sodium 141  Recent Lipid Panel    Component Value Date/Time   CHOL 179 09/02/2020 1035   TRIG 142 09/02/2020 1035   HDL 56 09/02/2020 1035   CHOLHDL 3.2 09/02/2020 1035   VLDL 28 01/14/2017 1114   LDLCALC 99 09/02/2020 1035    Physical Exam:    VS:  BP 128/60 (BP Location: Right Arm)   Pulse 67   Ht '5\' 5"'$  (1.651 m)   Wt 149 lb (67.6 kg)   SpO2 97%   BMI 24.79 kg/m     Wt Readings from Last 3 Encounters:  02/28/21 149 lb (67.6 kg)  01/05/21 150 lb (68 kg)  01/03/21 152 lb 3.2 oz (69 kg)     GEN: Well nourished, well developed in no acute distress HEENT: Normal NECK: No JVD; No carotid bruits LYMPHATICS: No lymphadenopathy CARDIAC: RRR, no murmurs, rubs, gallops RESPIRATORY:  Clear to auscultation without rales, wheezing or rhonchi  ABDOMEN: Soft, non-tender, non-distended MUSCULOSKELETAL:  No edema; No deformity  SKIN: Warm and dry NEUROLOGIC:  Alert and oriented x 3 PSYCHIATRIC:  Normal affect   ASSESSMENT:    1. Paroxysmal atrial fibrillation (HCC)   2. Essential hypertension   3. Hyperlipidemia, unspecified  hyperlipidemia type   4. Hypokalemia     PLAN:    Atrial fibrillation: Diagnosed during admission with gastroenteritis 10/2020.  CHA2DS2-VASc score 5 (hypertension, age x2, diabetes, female).  Zio patch x2 weeks on 12/22/2020 showed AF burden 4%, average rate 150, with longest episode lasting 2 hours 46 minutes.  Echocardiogram 12/28/2020 showed normal biventricular function, no significant valvular disease. -Continue diltiazem 360 mg daily -Continue Eliquis 5 mg twice daily  Hypertension: Currently on diltiazem 360 mg daily and losartan 50 mg daily.  Appears controlled  Hyperlipidemia: On pravastatin 40 mg daily.  LDL  99 on 09/02/2020  Hypokalemia: Potassium 2.6 on 11/18/2020.  Likely due to vomiting and diarrhea in the setting of Lasix use.  Lasix discontinued but appears was restarted.  Will check BMET  T2DM: A1c 6.6% on 09/02/2020.  Currently diet controlled.  RTC in 6 months   Medication Adjustments/Labs and Tests Ordered: Current medicines are reviewed at length with the patient today.  Concerns regarding medicines are outlined above.  Orders Placed This Encounter  Procedures   Basic metabolic panel   Magnesium   EKG 12-Lead    Meds ordered this encounter  Medications   diltiazem (CARDIZEM CD) 360 MG 24 hr capsule    Sig: Take 1 capsule (360 mg total) by mouth daily.    Dispense:  30 capsule    Refill:  5    Dose increase     Patient Instructions  Medication Instructions:  No Changes In Medications at this time.  *If you need a refill on your cardiac medications before your next appointment, please call your pharmacy*  Lab Work: BMET, [place]  If you have labs (blood work) drawn today and your tests are completely normal, you will receive your results only by: [place] (if you have MyChart) OR A paper copy in the mail If you have any lab test that is abnormal or we need to change your treatment, we will call you to review the results.  Follow-Up: At [facility], you and your health needs are our priority.  As part of our continuing mission to provide you with exceptional heart care, we have created designated Provider Care Teams.  These Care Teams include your primary Cardiologist (physician) and Advanced Practice Providers (APPs -  Physician Assistants and Nurse Practitioners) who all work together to provide you with the care you need, when you need it.  We recommend signing up for the patient portal called "MyChart".  Sign up information is provided on this After Visit Summary.  MyChart is used to connect with patients for Virtual Visits (Telemedicine).  Patients are able to view lab/test results, encounter notes, upcoming appointments, etc.  Non-urgent messages can be sent to your provider as well.   To learn more about what you can do with MyChart, go to [email or web address].    Your next appointment:   6 month(s)  The format for your next appointment:   In Person  Provider:   Oswaldo Milian, MD    Signed, Donato Heinz, MD  02/28/2021 4:26 PM    [place]

## 2021-03-01 ENCOUNTER — Ambulatory Visit: Payer: Medicare Other | Admitting: Cardiology

## 2021-03-01 LAB — BASIC METABOLIC PANEL
BUN/Creatinine Ratio: 20 (ref 12–28)
BUN: 17 mg/dL (ref 8–27)
CO2: 25 mmol/L (ref 20–29)
Calcium: 9.3 mg/dL (ref 8.7–10.3)
Chloride: 100 mmol/L (ref 96–106)
Creatinine, Ser: 0.84 mg/dL (ref 0.57–1.00)
Glucose: 107 mg/dL — ABNORMAL HIGH (ref 65–99)
Potassium: 4.2 mmol/L (ref 3.5–5.2)
Sodium: 143 mmol/L (ref 134–144)
eGFR: 68 mL/min/{1.73_m2} (ref >59)

## 2021-03-01 LAB — MAGNESIUM: Magnesium: 2.1 mg/dL (ref 1.6–2.3)

## 2021-03-03 ENCOUNTER — Telehealth: Payer: Self-pay | Admitting: Cardiology

## 2021-03-03 NOTE — Telephone Encounter (Signed)
Donato Heinz, MD  03/02/2021  7:07 AM EDT      Normal labs  The patient has been notified of the result and verbalized understanding.  All questions (if any) were answered. Antonieta Iba, RN 03/03/2021 10:38 AM

## 2021-03-03 NOTE — Telephone Encounter (Signed)
Pt was in the office to see Dr. Gardiner Rhyme on Tuesday, when she returned home she had a message to please contact our office. Please advise pt further

## 2021-03-21 ENCOUNTER — Ambulatory Visit: Payer: Medicare Other | Admitting: Internal Medicine

## 2021-04-05 ENCOUNTER — Other Ambulatory Visit: Payer: Self-pay | Admitting: Internal Medicine

## 2021-04-05 NOTE — Telephone Encounter (Signed)
Medication Refill - Medication: citalopram (CELEXA) 20 MG tablet  Has the patient contacted their pharmacy? Yes.   (Agent: If no, request that the patient contact the pharmacy for the refill.) (Agent: If yes, when and what did the pharmacy advise?)  Preferred Pharmacy (with phone number or street name):  [facility], [address]  [place] [address]  Phone: [phone] Fax: [phone]    Agent: Please be advised that RX refills may take up to 3 business days. We ask that you follow-up with your pharmacy.

## 2021-04-06 ENCOUNTER — Other Ambulatory Visit: Payer: Self-pay | Admitting: Physician Assistant

## 2021-04-06 MED ORDER — CITALOPRAM HYDROBROMIDE 20 MG PO TABS: 20.0000 mg | ORAL_TABLET | Freq: Every day | ORAL | 2 refills | Status: DC

## 2021-04-06 NOTE — Telephone Encounter (Signed)
Requested medications are due for refill today.  yes  Requested medications are on the active medications list.  yes  Last refill. 07/11/2020  Future visit scheduled.   yes  Notes to clinic.  Rx signed by Dr. Rana Snare.

## 2021-04-13 ENCOUNTER — Other Ambulatory Visit: Payer: Self-pay

## 2021-04-13 ENCOUNTER — Ambulatory Visit: Payer: Medicare Other | Attending: Internal Medicine | Admitting: Internal Medicine

## 2021-04-13 ENCOUNTER — Encounter (INDEPENDENT_AMBULATORY_CARE_PROVIDER_SITE_OTHER): Payer: Self-pay

## 2021-04-13 VITALS — BP 135/74 | HR 76 | Resp 16 | Wt 146.2 lb

## 2021-04-13 DIAGNOSIS — E1122 Type 2 diabetes mellitus with diabetic chronic kidney disease: Secondary | ICD-10-CM | POA: Diagnosis not present

## 2021-04-13 DIAGNOSIS — Z23 Encounter for immunization: Secondary | ICD-10-CM

## 2021-04-13 DIAGNOSIS — I152 Hypertension secondary to endocrine disorders: Secondary | ICD-10-CM

## 2021-04-13 DIAGNOSIS — N1831 Chronic kidney disease, stage 3a: Secondary | ICD-10-CM | POA: Diagnosis not present

## 2021-04-13 DIAGNOSIS — E1140 Type 2 diabetes mellitus with diabetic neuropathy, unspecified: Secondary | ICD-10-CM

## 2021-04-13 DIAGNOSIS — I480 Paroxysmal atrial fibrillation: Secondary | ICD-10-CM

## 2021-04-13 DIAGNOSIS — E1159 Type 2 diabetes mellitus with other circulatory complications: Secondary | ICD-10-CM

## 2021-04-13 DIAGNOSIS — R200 Anesthesia of skin: Secondary | ICD-10-CM

## 2021-04-13 LAB — POCT GLYCOSYLATED HEMOGLOBIN (HGB A1C): HbA1c, POC (controlled diabetic range): 5.8 % (ref 0.0–7.0)

## 2021-04-13 LAB — GLUCOSE, POCT (MANUAL RESULT ENTRY): POC Glucose: 80 mg/dl (ref 70–99)

## 2021-04-13 MED ORDER — SHINGRIX 50 MCG/0.5ML IM SUSR
0.5000 mL | Freq: Once | INTRAMUSCULAR | 1 refills | Status: AC
  Filled 2021-04-13: qty 0.5, 1d supply, fill #0

## 2021-04-13 NOTE — Progress Notes (Signed)
Patient ID: Ruth Delgado, female    DOB: Dec 25, 1934  MRN: [id number]  CC: Diabetes and Hypertension   Subjective: Ruth Delgado is a 86 y.o. female who presents for chronic ds management Her concerns today include:  Pt with hx of HTN, HL, PAF on anticoagulation, DM type 2, GERD, vit D def, DVT RT leg, gout, depression, gout  DM:  Results for orders placed or performed in visit on 04/13/21  POCT glucose (manual entry)  Result Value Ref Range   POC Glucose 80 70 - 99 mg/dl  POCT glycosylated hemoglobin (Hb A1C)  Result Value Ref Range   Hemoglobin A1C     HbA1c POC (<> result, manual entry)     HbA1c, POC (prediabetic range)     HbA1c, POC (controlled diabetic range) 5.8 0.0 - 7.0 %   Reports she had eye exam by Dr. Chrystie Nose last wk.  +Cataract that are stable Diet control Walks daily for 30 mins with her dog Toes numb on both feet on and off for a yr  Shingles resolve.  Had one shingles shot 2013. Offered Shingrix today but pt wants to hold off for a few wks because arms currently sore Stung by wasp on both arms last wk while working in her yard  HTN: checking daily and has some readings with her today - 127/76, 114/55, 105/69.  Compliant with medications including Cozaar, diltiazem, furosemide  A.fib:  bruise easily.  No bleeding No palpitations.  Compliant with Eliquis and diltiazem  Patient reports that she sometimes gets diarrhea when she eats knots.  Wants to know whether it is okay for her to take Imodium as needed.   Patient Active Problem List   Diagnosis Date Noted   Paroxysmal atrial fibrillation ([place]) 12/09/2020   Secondary hypercoagulable state ([place]) 12/09/2020   Atrial fibrillation with RVR ([place]) 11/15/2020   Nausea, vomiting and diarrhea 11/15/2020   CKD stage 2 due to type 2 diabetes mellitus ([place]) 03/06/2018   Overweight (BMI 25.0-29.9) 03/06/2018   Osteopenia 03/06/2018   Esophageal reflux 09/06/2015   Vitamin D deficiency 06/08/2013    Medication management 06/08/2013   Hypertension    Hyperlipemia    Gout    Type 2 diabetes mellitus with stage 3 chronic kidney disease, without long-term current use of insulin ([place])      Current Outpatient Medications on File Prior to Visit  Medication Sig Dispense Refill   acetaminophen (TYLENOL) 650 MG CR tablet Take 650 mg by mouth every 8 (eight) hours as needed for pain.     allopurinol (ZYLOPRIM) 300 MG tablet TAKE 1 TABLET BY MOUTH DAILY TO PREVENT GOUT 90 tablet 3   apixaban (ELIQUIS) 5 MG TABS tablet Take 1 tablet (5 mg total) by mouth 2 (two) times daily. 180 tablet 1   cholecalciferol (VITAMIN D) 1000 UNITS tablet Take 5,000 Units by mouth every other day.      citalopram (CELEXA) 20 MG tablet Take 1 tablet (20 mg total) by mouth daily. 30 tablet 2   diltiazem (CARDIZEM CD) 360 MG 24 hr capsule Take 1 capsule (360 mg total) by mouth daily. 30 capsule 5   furosemide (LASIX) 20 MG tablet Take 1 tablet (20 mg total) by mouth daily. 30 tablet 6   Lancets (FREESTYLE) lancets   12   losartan (COZAAR) 50 MG tablet Take 1 tablet (50 mg total) by mouth daily. 90 tablet 2   nystatin-triamcinolone ointment (MYCOLOG) Apply 1 application topically 2 (two) times daily. [place]  g 0   pravastatin (PRAVACHOL) 40 MG tablet Take  1 tablet  at Bedtime  for Cholesterol 90 tablet 0   Simethicone (GAS-X PO) Take 2 tablets by mouth daily as needed (bloating).     triamcinolone cream (KENALOG) 0.1 % Apply 1 application topically daily as needed (itching).  2   No current facility-administered medications on file prior to visit.    Allergies  Allergen Reactions   Buspar [Buspirone]     dysphoria   Lipitor [Atorvastatin]     Myalgias   Metformin And Related     Gas/bloating   Nsaids     GI upset   Sulfa Antibiotics Other (See Comments)    Reaction=throat itching   Sulfonamide Derivatives     Social History   Socioeconomic History   Marital status: Widowed    Spouse name: Not on file    Number of children: Not on file   Years of education: Not on file   Highest education level: Not on file  Occupational History   Occupation: retired  Tobacco Use   Smoking status: Former    Packs/day: 0.50    Years: 25.00    Pack years: 12.50    Types: Cigarettes    Quit date: 07/30/2006    Years since quitting: 14.7   Smokeless tobacco: Never  Substance and Sexual Activity   Alcohol use: Not Currently    Alcohol/week: 2.0 standard drinks    Types: 2 Standard drinks or equivalent per week    Comment: occasional, none in 3 months   Drug use: Never   Sexual activity: Not on file  Other Topics Concern   Not on file  Social History Narrative   Not on file   Social Determinants of Health   Financial Resource Strain: Not on file  Food Insecurity: Not on file  Transportation Needs: Not on file  Physical Activity: Not on file  Stress: Not on file  Social Connections: Not on file  Intimate Partner Violence: Not on file    Family History  Problem Relation Age of Onset   Diabetes Mother        suicide at age 93   Hypertension Father    CVA Father     Past Surgical History:  Procedure Laterality Date   ABDOMINAL HYSTERECTOMY     BIOPSY BREAST     (L) BREAST IN 1993   KNEE ARTHROSCOPY      ROS: Review of Systems Negative except as stated above  PHYSICAL EXAM: BP 135/74   Pulse 76   Resp 16   Wt 146 lb 3.2 oz (66.3 kg)   SpO2 98%   BMI 24.33 kg/m   Physical Exam  General appearance - alert, well appearing, and in no distress Mental status - normal mood, behavior, speech, dress, motor activity, and thought processes Mouth - mucous membranes moist, pharynx normal without lesions Neck - supple, no significant adenopathy Chest - clear to auscultation, no wheezes, rales or rhonchi, symmetric air entry Heart - normal rate, regular rhythm, normal S1, S2, no murmurs, rubs, clicks or gallops Extremities - no LE edema.  A few small areas of ecchymosis on both forearms.   Varicose veins on both lower legs and feet. Diabetic Foot Exam - Simple   Simple Foot Form Visual Inspection See comments: Yes Sensation Testing Intact to touch and monofilament testing bilaterally: Yes Pulse Check Posterior Tibialis and Dorsalis pulse intact bilaterally: Yes Comments Some varicose veins on the dorsal surface of both  feet.     CMP Latest Ref Rng & Units 02/28/2021 12/21/2020 12/01/2020  Glucose 65 - 99 mg/dL 107(H) 103(H) 78  BUN 8 - 27 mg/dL '17 13 16  '$ Creatinine 0.57 - 1.00 mg/dL 0.84 0.81 0.80  Sodium 134 - 144 mmol/L 143 141 141  Potassium 3.5 - 5.2 mmol/L 4.2 4.2 5.0  Chloride 96 - 106 mmol/L 100 99 104  CO2 20 - 29 mmol/L '25 27 30  '$ Calcium 8.7 - 10.3 mg/dL 9.3 9.2 9.6  Total Protein 6.1 - 8.1 g/dL - - 6.5  Total Bilirubin 0.2 - 1.2 mg/dL - - 0.5  Alkaline Phos 44 - 121 IU/L - - -  AST 10 - 35 U/L - - 14  ALT 6 - 29 U/L - - 13   Lipid Panel     Component Value Date/Time   CHOL 179 09/02/2020 1035   TRIG 142 09/02/2020 1035   HDL 56 09/02/2020 1035   CHOLHDL 3.2 09/02/2020 1035   VLDL 28 01/14/2017 1114   LDLCALC 99 09/02/2020 1035    CBC    Component Value Date/Time   WBC 7.4 12/01/2020 1615   RBC 3.88 12/01/2020 1615   HGB 12.4 12/01/2020 1615   HGB 13.2 11/28/2020 1229   HCT 38.3 12/01/2020 1615   HCT 37.7 11/28/2020 1229   PLT 277 12/01/2020 1615   PLT 274 11/28/2020 1229   MCV 98.7 12/01/2020 1615   MCV 97 11/28/2020 1229   MCH 32.0 12/01/2020 1615   MCHC 32.4 12/01/2020 1615   RDW 13.6 12/01/2020 1615   RDW 13.5 11/28/2020 1229   LYMPHSABS 2,768 12/01/2020 1615   MONOABS 0.6 11/18/2020 0621   EOSABS 104 12/01/2020 1615   BASOSABS 89 12/01/2020 1615    ASSESSMENT AND PLAN: 1. Controlled type 2 diabetes with neuropathy (HCC) At goal.  Continue healthy eating habits and regular exercise. - POCT glucose (manual entry) - POCT glycosylated hemoglobin (Hb A1C)  2. Hypertension associated with diabetes ([place]) Home blood pressure  readings are at goal.  Continue current medications and low-salt diet.  3. PAF (paroxysmal atrial fibrillation) (HCC) Controlled on Cardizem.  Tolerating Eliquis.  She will let me know if she has any excessive or spontaneous bruising.  4. Numbness of toes Likely due to diabetic neuropathy.  Patient not interested in trying gabapentin or Lyrica.  However we will also check a vitamin B12 level. - Vitamin B12  5. Need for immunization against influenza - Flu Vaccine QUAD 41moIM (Fluarix, Fluzone & Alfiuria Quad PF)  Patient will return in 2 to 3 weeks to have Shingrix vaccine.   Patient was given the opportunity to ask questions.  Patient verbalized understanding of the plan and was able to repeat key elements of the plan.   Orders Placed This Encounter  Procedures   Flu Vaccine QUAD 688moM (Fluarix, Fluzone & Alfiuria Quad PF)   Vitamin B12   POCT glucose (manual entry)   POCT glycosylated hemoglobin (Hb A1C)     Requested Prescriptions   Signed Prescriptions Disp Refills   Zoster Vaccine Adjuvanted (SHINGRIX) injection 0.5 mL 1    Sig: Inject 0.5 mLs into the muscle once for 1 dose.    Return in about 4 months (around 08/13/2021) [facility]  DeKarle PlumberMD, FACP

## 2021-04-14 ENCOUNTER — Telehealth: Payer: Self-pay

## 2021-04-14 LAB — VITAMIN B12: Vitamin B-12: 342 pg/mL (ref 232–1245)

## 2021-04-14 NOTE — Telephone Encounter (Signed)
Contacted pt to go over lab results pt is aware of results. DOB was confirmed.

## 2021-05-03 ENCOUNTER — Encounter: Payer: Self-pay | Admitting: Internal Medicine

## 2021-05-03 NOTE — Progress Notes (Signed)
MMG done 04/17/21 at [facility] RT breast. Additional imaging needed.Marland Kitchen

## 2021-05-10 ENCOUNTER — Other Ambulatory Visit: Payer: Self-pay | Admitting: Internal Medicine

## 2021-05-10 DIAGNOSIS — N6310 Unspecified lump in the right breast, unspecified quadrant: Secondary | ICD-10-CM

## 2021-05-18 ENCOUNTER — Telehealth: Payer: Self-pay | Admitting: Hematology and Oncology

## 2021-05-18 ENCOUNTER — Encounter: Payer: Self-pay | Admitting: Internal Medicine

## 2021-05-18 NOTE — Telephone Encounter (Signed)
Spoke to patient to confirm morning clinic appointment for 10/26

## 2021-05-19 ENCOUNTER — Encounter: Payer: Self-pay | Admitting: *Deleted

## 2021-05-19 DIAGNOSIS — D0511 Intraductal carcinoma in situ of right breast: Secondary | ICD-10-CM

## 2021-05-23 ENCOUNTER — Ambulatory Visit: Payer: Medicare Other | Admitting: Adult Health Nurse Practitioner

## 2021-05-23 DIAGNOSIS — D0511 Intraductal carcinoma in situ of right breast: Secondary | ICD-10-CM | POA: Insufficient documentation

## 2021-05-23 NOTE — Progress Notes (Signed)
[name] NOTE  Patient Care Team: Ladell Pier, MD as PCP - General (Internal Medicine) Jacelyn Pi, MD as Consulting Physician (Endocrinology) Rockwell Germany, RN as Oncology Nurse Navigator Mauro Kaufmann, RN as Oncology Nurse Navigator Donnie Mesa, MD as Consulting Physician (General Surgery) Nicholas Lose, MD as Consulting Physician (Hematology and Oncology) Eppie Gibson, MD as Attending Physician (Radiation Oncology)  CHIEF COMPLAINTS/PURPOSE OF CONSULTATION:  Newly diagnosed breast cancer  HISTORY OF PRESENTING ILLNESS:  Ruth Delgado 86 y.o. female is here because of recent diagnosis of DCIS of the right breast. Screening mammogram on 04/17/2021 showed indeterminate linear calcifications in the right breast. Diagnostic mammogram and [place] on 05/03/2021 showed 1 cm group of fine linear branching calcifications. She presents to the clinic today for initial evaluation and discussion of treatment options.  She is complaining of excessive bruising from being on blood thinners.  She also bled significantly into the breast from the biopsy.  I reviewed her records extensively and collaborated the history with the patient.  SUMMARY OF ONCOLOGIC HISTORY: Oncology History  Ductal carcinoma in situ (DCIS) of right breast  05/19/2021 Initial Diagnosis   Screening mammogram: indeterminate linear calcifications in the right breast. Diagnostic mammogram and [place]: 1 cm group of fine linear branching calcifications.  Stereotactic biopsy: Low-grade DCIS, ER 100%, PR 95%   05/24/2021 Cancer Staging   Staging form: Breast, AJCC 8th Edition - Clinical stage from 05/24/2021: Stage 0 (cTis (DCIS), cN0, cM0, G1, ER+, PR+, HER2: Not Assessed) - Signed by Nicholas Lose, MD on 05/24/2021 Histologic grading system: 3 grade system      MEDICAL HISTORY:  Past Medical History:  Diagnosis Date   A-fib ([place])    Anxiety    Arthritis    Breast cancer ([place])     Diabetes mellitus    Gout    Hyperlipemia    Hypertension     SURGICAL HISTORY: Past Surgical History:  Procedure Laterality Date   ABDOMINAL HYSTERECTOMY     BIOPSY BREAST     (L) BREAST IN 1993   KNEE ARTHROSCOPY      SOCIAL HISTORY: Social History   Socioeconomic History   Marital status: Widowed    Spouse name: Not on file   Number of children: Not on file   Years of education: Not on file   Highest education level: Not on file  Occupational History   Occupation: retired  Tobacco Use   Smoking status: Former    Packs/day: 0.50    Years: 25.00    Pack years: 12.50    Types: Cigarettes    Quit date: 07/30/2006    Years since quitting: 14.8   Smokeless tobacco: Never  Substance and Sexual Activity   Alcohol use: Not Currently    Alcohol/week: 2.0 standard drinks    Types: 2 Standard drinks or equivalent per week    Comment: occasional, none in 3 months   Drug use: Never   Sexual activity: Not on file  Other Topics Concern   Not on file  Social History Narrative   Not on file   Social Determinants of Health   Financial Resource Strain: Not on file  Food Insecurity: Not on file  Transportation Needs: Not on file  Physical Activity: Not on file  Stress: Not on file  Social Connections: Not on file  Intimate Partner Violence: Not on file    FAMILY HISTORY: Family History  Problem Relation Age of Onset   Diabetes Mother  suicide at age 39   Hypertension Father    CVA Father     ALLERGIES:  is allergic to buspar [buspirone], lipitor [atorvastatin], metformin and related, nsaids, sulfa antibiotics, and sulfonamide derivatives.  MEDICATIONS:  Current Outpatient Medications  Medication Sig Dispense Refill   acetaminophen (TYLENOL) 650 MG CR tablet Take 650 mg by mouth every 8 (eight) hours as needed for pain.     allopurinol (ZYLOPRIM) 300 MG tablet TAKE 1 TABLET BY MOUTH DAILY TO PREVENT GOUT 90 tablet 3   apixaban (ELIQUIS) 5 MG TABS tablet Take  1 tablet (5 mg total) by mouth 2 (two) times daily. 180 tablet 1   cholecalciferol (VITAMIN D) 1000 UNITS tablet Take 5,000 Units by mouth every other day.      citalopram (CELEXA) 20 MG tablet Take 1 tablet (20 mg total) by mouth daily. 30 tablet 2   diltiazem (CARDIZEM CD) 360 MG 24 hr capsule Take 1 capsule (360 mg total) by mouth daily. 30 capsule 5   furosemide (LASIX) 20 MG tablet Take 1 tablet (20 mg total) by mouth daily. 30 tablet 6   Lancets (FREESTYLE) lancets   12   letrozole (FEMARA) 2.5 MG tablet Take 1 tablet (2.5 mg total) by mouth daily. 90 tablet 3   losartan (COZAAR) 50 MG tablet Take 1 tablet (50 mg total) by mouth daily. 90 tablet 2   nystatin-triamcinolone ointment (MYCOLOG) Apply 1 application topically 2 (two) times daily. 30 g 0   pravastatin (PRAVACHOL) 40 MG tablet Take  1 tablet  at Bedtime  for Cholesterol 90 tablet 0   Simethicone (GAS-X PO) Take 2 tablets by mouth daily as needed (bloating).     triamcinolone cream (KENALOG) 0.1 % Apply 1 application topically daily as needed (itching).  2   No current facility-administered medications for this visit.    REVIEW OF SYSTEMS:     All other systems were reviewed with the patient and are negative.  PHYSICAL EXAMINATION: ECOG PERFORMANCE STATUS: 1 - Symptomatic but completely ambulatory  Vitals:   05/24/21 0830  BP: (!) 141/85  Pulse: 64  Resp: 18  Temp: 97.7 F (36.5 C)  SpO2: 97%   Filed Weights   05/24/21 0830  Weight: 148 lb 12.8 oz (67.5 kg)   LABORATORY DATA:  I have reviewed the data as listed Lab Results  Component Value Date   WBC 6.9 05/24/2021   HGB 13.4 05/24/2021   HCT 39.7 05/24/2021   MCV 97.8 05/24/2021   PLT 190 05/24/2021   Lab Results  Component Value Date   NA 141 05/24/2021   K 4.5 05/24/2021   CL 102 05/24/2021   CO2 27 05/24/2021    RADIOGRAPHIC STUDIES: I have personally reviewed the radiological reports and agreed with the findings in the report.  ASSESSMENT  AND PLAN:  Ductal carcinoma in situ (DCIS) of right breast October 2022:Screening mammogram: indeterminate linear calcifications in the right breast. Diagnostic mammogram and [place]: 1 cm group of fine linear branching calcifications.  Stereotactic biopsy: Low-grade DCIS, ER 100%, PR 95%  Pathology review: I discussed with the patient the difference between DCIS and invasive breast cancer. It is considered a precancerous lesion. DCIS is classified as a 0. It is generally detected through mammograms as calcifications. We discussed the significance of grades and its impact on prognosis. We also discussed the importance of ER and PR receptors and their implications to adjuvant treatment options. Prognosis of DCIS dependence on grade, comedo necrosis. It is anticipated  that if not treated, 20-30% of DCIS can develop into invasive breast cancer.  Recommendation: 1. Breast conserving surgery versus Comet clinical trial participation 2. +/- adjuvant radiation therapy 3. Followed by antiestrogen therapy with anastrozole 1 mg daily 5 years  AFT 25 COMET Phase 3 clinical trial for low risk DCIS grade 1/2 PR positive, age greater than 40 randomized to surgery +/- radiation, +/- endocrine therapy versus active surveillance with +/- endocrine therapy surveillance with mammograms every 6 months for 5 years;patient's have option to decline elevated arm and still be followed on study   After listening to all the options, patient decided to go on antiestrogen therapy alone without surgery. I sent a prescription and counseled her about antiestrogen therapy  Return to clinic in 3 months for follow-up.  Mammogram will be done in 6 months  All questions were answered. The patient knows to call the clinic with any problems, questions or concerns.   Rulon Eisenmenger, MD, MPH 05/24/2021    I, Thana Ates, am acting as scribe for Nicholas Lose, MD.

## 2021-05-23 NOTE — Progress Notes (Signed)
Radiation Oncology         [phone] ________________________________  Initial Outpatient Consultation  Name: Ruth Delgado MRN: [id number]  Date: 05/24/2021  DOB: 18-Jun-1935  GD:[id number], Dalbert Batman, MD  Donnie Mesa, MD   REFERRING PHYSICIAN: Donnie Mesa, MD  DIAGNOSIS:    ICD-10-CM   1. Ductal carcinoma in situ (DCIS) of right breast  D05.11        Cancer Staging Ductal carcinoma in situ (DCIS) of right breast Staging form: Breast, AJCC 8th Edition - Clinical stage from 05/24/2021: Stage 0 (cTis (DCIS), cN0, cM0, G1, ER+, PR+, HER2: Not Assessed) - Signed by Nicholas Lose, MD on 05/24/2021 Histologic grading system: 3 grade system   CHIEF COMPLAINT: Here to discuss management of right breast DCIS  HISTORY OF PRESENT ILLNESS::Ruth Delgado is a 86 y.o. female who presented with right breast abnormality on the following imaging: bilateral screening mammogram on the date of 04/17/21. Diagnostic right breast mammogram on 05/03/21 further demonstrated a 1 cm group of fine linear branching calcifications in the right breast as suspicious of malignancy. No symptoms, if any, were reported at that time.     Biopsy of right breast at 3:00-3:30 revealed low-grade DCIS, ER and PR 100%  She reports that she has other family members with medical issues and this is a stressor to her.  She would like to avoid surgery if possible.  She controls her DM with diet.  She is here with a neighbor today who is very supportive.  PREVIOUS RADIATION THERAPY: No  PAST MEDICAL HISTORY:  has a past medical history of A-fib ([place]), Anxiety, Arthritis, Breast cancer ([place]), Diabetes mellitus, Gout, Hyperlipemia, and Hypertension.    PAST SURGICAL HISTORY: Past Surgical History:  Procedure Laterality Date   ABDOMINAL HYSTERECTOMY     BIOPSY BREAST     (L) BREAST IN 1993   KNEE ARTHROSCOPY      FAMILY HISTORY: family history includes CVA in her father; Diabetes in her mother; Hypertension  in her father.  SOCIAL HISTORY:  reports that she quit smoking about 14 years ago. Her smoking use included cigarettes. She has a 12.50 pack-year smoking history. She has never used smokeless tobacco. She reports that she does not currently use alcohol after a past usage of about 2.0 standard drinks per week. She reports that she does not use drugs.  ALLERGIES: Buspar [buspirone], Lipitor [atorvastatin], Metformin and related, Nsaids, Sulfa antibiotics, and Sulfonamide derivatives  MEDICATIONS:  Current Outpatient Medications  Medication Sig Dispense Refill   acetaminophen (TYLENOL) 650 MG CR tablet Take 650 mg by mouth every 8 (eight) hours as needed for pain.     allopurinol (ZYLOPRIM) 300 MG tablet TAKE 1 TABLET BY MOUTH DAILY TO PREVENT GOUT 90 tablet 3   apixaban (ELIQUIS) 5 MG TABS tablet Take 1 tablet (5 mg total) by mouth 2 (two) times daily. 180 tablet 1   cholecalciferol (VITAMIN D) 1000 UNITS tablet Take 5,000 Units by mouth every other day.      citalopram (CELEXA) 20 MG tablet Take 1 tablet (20 mg total) by mouth daily. 30 tablet 2   diltiazem (CARDIZEM CD) 360 MG 24 hr capsule Take 1 capsule (360 mg total) by mouth daily. 30 capsule 5   furosemide (LASIX) 20 MG tablet Take 1 tablet (20 mg total) by mouth daily. 30 tablet 6   Lancets (FREESTYLE) lancets   12   letrozole (FEMARA) 2.5 MG tablet Take 1 tablet (2.5 mg total) by mouth daily. [place]  tablet 3   losartan (COZAAR) 50 MG tablet Take 1 tablet (50 mg total) by mouth daily. 90 tablet 2   nystatin-triamcinolone ointment (MYCOLOG) Apply 1 application topically 2 (two) times daily. 30 g 0   pravastatin (PRAVACHOL) 40 MG tablet Take  1 tablet  at Bedtime  for Cholesterol 90 tablet 0   Simethicone (GAS-X PO) Take 2 tablets by mouth daily as needed (bloating).     triamcinolone cream (KENALOG) 0.1 % Apply 1 application topically daily as needed (itching).  2   No current facility-administered medications for this encounter.     REVIEW OF SYSTEMS: As above in HPI.   PHYSICAL EXAM:  vitals were not taken for this visit.   General: Alert and oriented, in no acute distress Heart: Regular in rate and rhythm with no murmurs, rubs, or gallops. Chest: Clear to auscultation bilaterally, with no rhonchi, wheezes, or rales. Extremities: No cyanosis or edema. Musculoskeletal: symmetric strength and muscle tone throughout. Neurologic:  No obvious focalities. Speech is fluent. Coordination is intact. Psychiatric: Judgment and insight are intact. Affect is appropriate. Breasts: bruise at 3:00 right breast biopsy site with mild thickening of soft tissue . No other palpable masses appreciated in the breasts or axillae bilaterally .  ECOG = 1  0 - Asymptomatic (Fully active, able to carry on all predisease activities without restriction)  1 - Symptomatic but completely ambulatory (Restricted in physically strenuous activity but ambulatory and able to carry out work of a light or sedentary nature. For example, light housework, office work)  2 - Symptomatic, <50% in bed during the day (Ambulatory and capable of all self care but unable to carry out any work activities. Up and about more than 50% of waking hours)  3 - Symptomatic, >50% in bed, but not bedbound (Capable of only limited self-care, confined to bed or chair 50% or more of waking hours)  4 - Bedbound (Completely disabled. Cannot carry on any self-care. Totally confined to bed or chair)  5 - Death   Eustace Pen MM, Creech RH, Tormey DC, et al. [phone]). "Toxicity and response criteria of the [facility]". [place] Oncol. 5 (6): 649-55   LABORATORY DATA:  Lab Results  Component Value Date   WBC 6.9 05/24/2021   HGB 13.4 05/24/2021   HCT 39.7 05/24/2021   MCV 97.8 05/24/2021   PLT 190 05/24/2021   CMP     Component Value Date/Time   NA 141 05/24/2021 0808   NA 143 02/28/2021 0941   K 4.5 05/24/2021 0808   CL 102 05/24/2021 0808   CO2  27 05/24/2021 0808   GLUCOSE 118 (H) 05/24/2021 0808   BUN 18 05/24/2021 0808   BUN 17 02/28/2021 0941   CREATININE 0.86 05/24/2021 0808   CREATININE 0.80 12/01/2020 1615   CALCIUM 9.5 05/24/2021 0808   PROT 7.3 05/24/2021 0808   PROT 6.6 11/28/2020 1229   ALBUMIN 4.2 05/24/2021 0808   ALBUMIN 4.3 11/28/2020 1229   AST 20 05/24/2021 0808   ALT 15 05/24/2021 0808   ALKPHOS 54 05/24/2021 0808   BILITOT 0.6 05/24/2021 0808   GFRNONAA >60 05/24/2021 0808   GFRNONAA 68 12/01/2020 1615   GFRAA 79 12/01/2020 1615        RADIOGRAPHY: As above, I personally reviewed her imaging today at breast tumor board    IMPRESSION/PLAN: This is a lovely 86 year old woman with low-grade ER positive DCIS.  She has met with medical oncology and would like to  observe this while taking antiestrogen treatment.  We discussed more aggressive options. She would like to avoid surgery and radiation if possible.  I think that is a reasonable plan.  I will see her back on an as-needed basis.  She understands that if she undergoes surgery (lumpectomy ) in the years to come, that radiation may be considered as adjuvant therapy to reduce her risk of relapse moving forward.  I wished her the very best.  On date of service, in total, I spent 30 minutes on this encounter. Patient was seen in person.   __________________________________________   Eppie Gibson, MD  This document serves as a record of services personally performed by Eppie Gibson, MD. It was created on her behalf by Roney Mans, a trained medical scribe. The creation of this record is based on the scribe's personal observations and the provider's statements to them. This document has been checked and approved by the attending provider.

## 2021-05-24 ENCOUNTER — Other Ambulatory Visit: Payer: Self-pay

## 2021-05-24 ENCOUNTER — Inpatient Hospital Stay: Payer: Medicare Other | Attending: Hematology and Oncology

## 2021-05-24 ENCOUNTER — Other Ambulatory Visit: Payer: Self-pay | Admitting: Internal Medicine

## 2021-05-24 ENCOUNTER — Encounter: Payer: Self-pay | Admitting: *Deleted

## 2021-05-24 ENCOUNTER — Ambulatory Visit: Payer: Medicare Other | Admitting: Physical Therapy

## 2021-05-24 ENCOUNTER — Telehealth: Payer: Self-pay | Admitting: *Deleted

## 2021-05-24 ENCOUNTER — Encounter: Payer: Self-pay | Admitting: Radiation Oncology

## 2021-05-24 ENCOUNTER — Encounter: Payer: Self-pay | Admitting: Hematology and Oncology

## 2021-05-24 ENCOUNTER — Inpatient Hospital Stay (HOSPITAL_BASED_OUTPATIENT_CLINIC_OR_DEPARTMENT_OTHER): Payer: Medicare Other | Admitting: Hematology and Oncology

## 2021-05-24 ENCOUNTER — Ambulatory Visit
Admission: RE | Admit: 2021-05-24 | Discharge: 2021-05-24 | Disposition: A | Discharge status: Home / Self Care | Payer: Medicare Other | Source: Ambulatory Visit | Attending: Radiation Oncology | Admitting: Radiation Oncology

## 2021-05-24 DIAGNOSIS — E785 Hyperlipidemia, unspecified: Secondary | ICD-10-CM | POA: Diagnosis not present

## 2021-05-24 DIAGNOSIS — I4891 Unspecified atrial fibrillation: Secondary | ICD-10-CM | POA: Diagnosis not present

## 2021-05-24 DIAGNOSIS — M109 Gout, unspecified: Secondary | ICD-10-CM | POA: Diagnosis not present

## 2021-05-24 DIAGNOSIS — E119 Type 2 diabetes mellitus without complications: Secondary | ICD-10-CM | POA: Diagnosis not present

## 2021-05-24 DIAGNOSIS — Z87891 Personal history of nicotine dependence: Secondary | ICD-10-CM | POA: Diagnosis not present

## 2021-05-24 DIAGNOSIS — Z886 Allergy status to analgesic agent status: Secondary | ICD-10-CM | POA: Diagnosis not present

## 2021-05-24 DIAGNOSIS — D0511 Intraductal carcinoma in situ of right breast: Secondary | ICD-10-CM | POA: Diagnosis present

## 2021-05-24 DIAGNOSIS — Z882 Allergy status to sulfonamides status: Secondary | ICD-10-CM | POA: Diagnosis not present

## 2021-05-24 DIAGNOSIS — Z833 Family history of diabetes mellitus: Secondary | ICD-10-CM | POA: Insufficient documentation

## 2021-05-24 DIAGNOSIS — Z7901 Long term (current) use of anticoagulants: Secondary | ICD-10-CM | POA: Insufficient documentation

## 2021-05-24 DIAGNOSIS — I10 Essential (primary) hypertension: Secondary | ICD-10-CM | POA: Insufficient documentation

## 2021-05-24 DIAGNOSIS — E876 Hypokalemia: Secondary | ICD-10-CM

## 2021-05-24 DIAGNOSIS — Z8249 Family history of ischemic heart disease and other diseases of the circulatory system: Secondary | ICD-10-CM | POA: Insufficient documentation

## 2021-05-24 LAB — GENETIC SCREENING ORDER

## 2021-05-24 LAB — CBC WITH DIFFERENTIAL (CANCER CENTER ONLY)
Abs Immature Granulocytes: 0.02 10*3/uL (ref 0.00–0.07)
Basophils Absolute: 0.1 10*3/uL (ref 0.0–0.1)
Basophils Relative: 1 %
Eosinophils Absolute: 0.1 10*3/uL (ref 0.0–0.5)
Eosinophils Relative: 2 %
HCT: 39.7 % (ref 36.0–46.0)
Hemoglobin: 13.4 g/dL (ref 12.0–15.0)
Immature Granulocytes: 0 %
Lymphocytes Relative: 34 %
Lymphs Abs: 2.4 10*3/uL (ref 0.7–4.0)
MCH: 33 pg (ref 26.0–34.0)
MCHC: 33.8 g/dL (ref 30.0–36.0)
MCV: 97.8 fL (ref 80.0–100.0)
Monocytes Absolute: 0.5 10*3/uL (ref 0.1–1.0)
Monocytes Relative: 7 %
Neutro Abs: 3.9 10*3/uL (ref 1.7–7.7)
Neutrophils Relative %: 56 %
Platelet Count: 190 10*3/uL (ref 150–400)
RBC: 4.06 MIL/uL (ref 3.87–5.11)
RDW: 13.8 % (ref 11.5–15.5)
WBC Count: 6.9 10*3/uL (ref 4.0–10.5)
nRBC: 0 % (ref 0.0–0.2)

## 2021-05-24 LAB — CMP (CANCER CENTER ONLY)
ALT: 15 U/L (ref 0–44)
AST: 20 U/L (ref 15–41)
Albumin: 4.2 g/dL (ref 3.5–5.0)
Alkaline Phosphatase: 54 U/L (ref 38–126)
Anion gap: 12 (ref 5–15)
BUN: 18 mg/dL (ref 8–23)
CO2: 27 mmol/L (ref 22–32)
Calcium: 9.5 mg/dL (ref 8.9–10.3)
Chloride: 102 mmol/L (ref 98–111)
Creatinine: 0.86 mg/dL (ref 0.44–1.00)
GFR, Estimated: >60 mL/min (ref >60)
Glucose, Bld: 118 mg/dL — ABNORMAL HIGH (ref 70–99)
Potassium: 4.5 mmol/L (ref 3.5–5.1)
Sodium: 141 mmol/L (ref 135–145)
Total Bilirubin: 0.6 mg/dL (ref 0.3–1.2)
Total Protein: 7.3 g/dL (ref 6.5–8.1)

## 2021-05-24 MED ORDER — LETROZOLE 2.5 MG PO TABS: 2.5000 mg | ORAL_TABLET | Freq: Every day | ORAL | 3 refills | Status: DC

## 2021-05-24 NOTE — Progress Notes (Signed)
[place] Clinical Social Work  Initial Assessment   Ruth Delgado is a 86 y.o. year old female accompanied by friend/ "chosen daughter" who lives next door. Clinical Social Work was referred by [facility] for assessment of psychosocial needs.   SDOH (Social Determinants of Health) assessments performed: Yes SDOH Interventions    Flowsheet Row Most Recent Value  SDOH Interventions   Food Insecurity Interventions Intervention Not Indicated  Financial Strain Interventions Intervention Not Indicated  Housing Interventions Intervention Not Indicated  Transportation Interventions Intervention Not Indicated       Distress Screen completed: Yes ONCBCN DISTRESS SCREENING 05/24/2021  Screening Type Initial Screening  Distress experienced in past week (1-10) 2  Emotional problem type Adjusting to illness      Family/Social Information:  Housing Arrangement: patient lives alone with small chihuahua she's had for 11 years.  Family members/support persons in your life? Family, Friends, and [employer]. Neighbor who accompanied pt to appointment is big source of support, as well as many church members. Daughter who lives in [place] has cancer, other daughter lives in [place] and is full time caregiver for her husband who is ill. Transportation concerns: no  Employment: Retired. Income source: Paediatric nurse concerns: No Type of concern: None Food access concerns: no Religious or spiritual practice: yes Medication Concerns: no  Services Currently in place:  n/a  Coping/ Adjustment to diagnosis: Patient understands treatment plan and what happens next? yes Concerns about diagnosis and/or treatment: Overwhelmed by information Patient reported stressors: Adjusting to my illness Patient enjoys gardening, being outside, and going for walks. Current coping skills/ strengths: Capable of independent living , Religious Affiliation , and Supportive family/friends     SUMMARY: Current SDOH  Barriers:  Limited social support from family members, but lots of support from church and neighbors.   Interventions: Discussed common feeling and emotions when being diagnosed with cancer, and the importance of support during treatment Informed patient of the support team roles and support services at [facility] Provided CSW contact information and encouraged patient to call with any questions or concerns   Follow Up Plan: Patient will contact CSW with any support or resource needs Patient verbalizes understanding of plan: Yes   Rosary Lively, Social Work Intern Supervised by Kennith [place] , LCSW

## 2021-05-24 NOTE — Assessment & Plan Note (Signed)
October 2022:Screening mammogram: indeterminate linear calcifications in the right breast. Diagnostic mammogram and [place]: 1 cm group of fine linear branching calcifications.  Stereotactic biopsy: Low-grade DCIS, ER 100%, PR 95%  Pathology review: I discussed with the patient the difference between DCIS and invasive breast cancer. It is considered a precancerous lesion. DCIS is classified as a 0. It is generally detected through mammograms as calcifications. We discussed the significance of grades and its impact on prognosis. We also discussed the importance of ER and PR receptors and their implications to adjuvant treatment options. Prognosis of DCIS dependence on grade, comedo necrosis. It is anticipated that if not treated, 20-30% of DCIS can develop into invasive breast cancer.  Recommendation: 1. Breast conserving surgery versus Comet clinical trial participation 2. +/- adjuvant radiation therapy 3. Followed by antiestrogen therapy with tamoxifen 5 years  Tamoxifen counseling: We discussed the risks and benefits of tamoxifen. These include but not limited to insomnia, hot flashes, mood changes, vaginal dryness, and weight gain. Although rare, serious side effects including endometrial cancer, risk of blood clots were also discussed. We strongly believe that the benefits far outweigh the risks. Patient understands these risks and consented to starting treatment. Planned treatment duration is 5 years.  AFT 25 COMET Phase 3 clinical trial for low risk DCIS grade 1/2 PR positive, age greater than 40 randomized to surgery +/- radiation, +/- endocrine therapy versus active surveillance with +/- endocrine therapy surveillance with mammograms every 6 months for 5 years;patient's have option to decline elevated arm and still be followed on study

## 2021-05-24 NOTE — Telephone Encounter (Signed)
Received call from pt stating Letrozole 2.5 mg p.o tablets were called in to the pharmacy but was under the assumption that Anastrozole 1 mg would be called in.  Per MD pt needing to start Letrozole 2.5 mg tablet oral daily.  Pt verbalized understanding and appreciative of advice.

## 2021-05-25 ENCOUNTER — Encounter: Payer: Self-pay | Admitting: *Deleted

## 2021-05-25 NOTE — Telephone Encounter (Signed)
Requested Prescriptions  Pending Prescriptions Disp Refills  . pravastatin (PRAVACHOL) 40 MG tablet [Pharmacy Med Name: PRAVASTATIN NA 40 MG TAB 40 Tablet] 90 tablet 1    Sig: TAKE 1 TABLET BY MOUTH AT BEDTIME FOR CHOLESTEROL     Cardiovascular:  Antilipid - Statins Passed - 05/24/2021  1:15 PM      Passed - Total Cholesterol in normal range and within 360 days    Cholesterol  Date Value Ref Range Status  09/02/2020 179 <200 mg/dL Final         Passed - LDL in normal range and within 360 days    LDL Cholesterol (Calc)  Date Value Ref Range Status  09/02/2020 99 mg/dL (calc) Final    Comment:    Reference range: <100 . Desirable range <100 mg/dL for primary prevention;   <70 mg/dL for patients with CHD or diabetic patients  with > or = 2 CHD risk factors. Marland Kitchen LDL-C is now calculated using the Martin-Hopkins  calculation, which is a validated novel method providing  better accuracy than the Friedewald equation in the  estimation of LDL-C.  Cresenciano Genre et al. Annamaria Helling. 9390;300(92): 2061-2068  (http://education.[facility].com/faq/FAQ164)          Passed - HDL in normal range and within 360 days    HDL  Date Value Ref Range Status  09/02/2020 56 > OR = 50 mg/dL Final         Passed - Triglycerides in normal range and within 360 days    Triglycerides  Date Value Ref Range Status  09/02/2020 142 <150 mg/dL Final         Passed - Patient is not pregnant      Passed - Valid encounter within last 12 months    Recent Outpatient Visits          1 month ago Controlled type 2 diabetes with neuropathy [facility])   [place] Ladell Pier, MD   4 months ago Establishing care with new doctor, encounter for   [place], MD      Future Appointments            In 2 months Wynetta Emery Dalbert Batman, MD [place]   In 3 months Donato Heinz, MD [facility]  [place], [facility]

## 2021-05-30 ENCOUNTER — Ambulatory Visit: Payer: Medicare Other | Admitting: Adult Health Nurse Practitioner

## 2021-06-01 ENCOUNTER — Telehealth: Payer: Self-pay | Admitting: *Deleted

## 2021-06-01 NOTE — Telephone Encounter (Signed)
Spoke to pt concerning Ruth Delgado from 10.26.22. Denies questions or concerns regarding dx or treatment care plan. Encourage pt to call with needs. Received verbal understanding.

## 2021-06-05 ENCOUNTER — Telehealth: Payer: Self-pay

## 2021-06-05 NOTE — Telephone Encounter (Signed)
Returned call from pt to confirm that she can take Claritin and Imodium as needed for her allergies and diarrhea.

## 2021-06-16 ENCOUNTER — Encounter: Payer: Self-pay | Admitting: *Deleted

## 2021-06-16 NOTE — Progress Notes (Signed)
[place] CSW Progress Notes  Social Work Intern contacted patient by phone per [facility] follow up protocol. Patient reports feeling tired but otherwise is doing ok physically.  Patient reports lots of family difficulties- Son in law has been unwell, he provides for daughter who is a cancer survivor. Other daughter is caring for her husband who needs full time care. Pt expressed "It's been one thing after another" but that she still has her faith in God, and she has neighbors and friends who look after her. She denies any resource or financial needs at this time.  Intern and patient discussed the challenges of a cancer diagnosis that comes at a time when other family members are also ill; the need to reach out for support; and the importance of keeping perspective in challenging times. Patient maintains sense of gratitude and sense of humor, and says she is just "waiting for a better day" but her spirits are still high.   Intern reiterated available support services, including counseling with [facility] chaplain, and provided contact information. Pt says she will reach out chaplain and CSW if needed.  Rosary Lively, Social Work Intern Supervised by Gwinda [place], LCSW

## 2021-07-04 ENCOUNTER — Encounter (HOSPITAL_COMMUNITY): Payer: Self-pay | Admitting: Physician Assistant

## 2021-07-04 ENCOUNTER — Ambulatory Visit (HOSPITAL_COMMUNITY)
Admission: RE | Admit: 2021-07-04 | Discharge: 2021-07-04 | Disposition: A | Discharge status: Home / Self Care | Payer: Medicare Other | Source: Ambulatory Visit | Attending: Physician Assistant | Admitting: Physician Assistant

## 2021-07-04 ENCOUNTER — Other Ambulatory Visit: Payer: Self-pay

## 2021-07-04 VITALS — BP 124/72 | HR 77 | Ht 65.0 in | Wt 147.8 lb

## 2021-07-04 DIAGNOSIS — M109 Gout, unspecified: Secondary | ICD-10-CM | POA: Insufficient documentation

## 2021-07-04 DIAGNOSIS — Z7901 Long term (current) use of anticoagulants: Secondary | ICD-10-CM | POA: Diagnosis not present

## 2021-07-04 DIAGNOSIS — D6869 Other thrombophilia: Secondary | ICD-10-CM | POA: Diagnosis not present

## 2021-07-04 DIAGNOSIS — I480 Paroxysmal atrial fibrillation: Secondary | ICD-10-CM

## 2021-07-04 DIAGNOSIS — E785 Hyperlipidemia, unspecified: Secondary | ICD-10-CM | POA: Insufficient documentation

## 2021-07-04 DIAGNOSIS — E119 Type 2 diabetes mellitus without complications: Secondary | ICD-10-CM | POA: Insufficient documentation

## 2021-07-04 DIAGNOSIS — I10 Essential (primary) hypertension: Secondary | ICD-10-CM | POA: Insufficient documentation

## 2021-07-04 NOTE — Progress Notes (Signed)
Primary Care Physician: Ladell Pier, MD Primary Cardiologist: Dr Gardiner Rhyme  Primary Electrophysiologist: none Referring Physician: Dr Grace Blight is a 86 y.o. female with a history of T2DM, hypertension, hyperlipidemia, gout , atrial fibrillation who presents for follow up in the [facility]. The patient was initially diagnosed with atrial fibrillation remotely. More recently she presented to the ED with an acute GI illness. ECG showed afib with RVR. She converted to SR spontaneously. Patient is on Eliquis for a CHADS2VASC score of 5. She was seen by Dr Gardiner Rhyme 11/28/20 and a Zio monitor was placed to evaluate arrhythmia burden. On 12/08/20 she called the clinic with elevated heart rates. She reports the episode lasted about one hour. The cardiac monitor showed 4% afib burden with RVR.   On follow up today, patient reports that she has done well since her last visit. She has had only very rare palpitations. She remains active by walking daily. She denies any bleeding issues on anticoagulation.   Today, she denies symptoms of chest pain, shortness of breath, orthopnea, PND, lower extremity edema, dizziness, presyncope, syncope, snoring, daytime somnolence, bleeding, or neurologic sequela. The patient is tolerating medications without difficulties and is otherwise without complaint today.    Atrial Fibrillation Risk Factors:  she does not have symptoms or diagnosis of sleep apnea. she does not have a history of rheumatic fever.   she has a BMI of Body mass index is 24.6 kg/m.Marland Kitchen Filed Weights   07/04/21 1017  Weight: 67 kg     Family History  Problem Relation Age of Onset   Diabetes Mother        suicide at age 89   Hypertension Father    CVA Father      Atrial Fibrillation Management history:  Previous antiarrhythmic drugs: none Previous cardioversions: none Previous ablations: none CHADS2VASC score: 5 Anticoagulation  history: Eliquis   Past Medical History:  Diagnosis Date   A-fib ([place])    Anxiety    Arthritis    Breast cancer ([place])    Diabetes mellitus    Gout    Hyperlipemia    Hypertension    Past Surgical History:  Procedure Laterality Date   ABDOMINAL HYSTERECTOMY     BIOPSY BREAST     (L) BREAST IN 1993   KNEE ARTHROSCOPY      Current Outpatient Medications  Medication Sig Dispense Refill   acetaminophen (TYLENOL) 650 MG CR tablet Take 650 mg by mouth every 8 (eight) hours as needed for pain.     allopurinol (ZYLOPRIM) 300 MG tablet TAKE 1 TABLET BY MOUTH DAILY TO PREVENT GOUT 90 tablet 3   apixaban (ELIQUIS) 5 MG TABS tablet Take 1 tablet (5 mg total) by mouth 2 (two) times daily. 180 tablet 1   cholecalciferol (VITAMIN D) 1000 UNITS tablet Take 5,000 Units by mouth every other day.      citalopram (CELEXA) 20 MG tablet Take 1 tablet (20 mg total) by mouth daily. 30 tablet 2   diltiazem (CARDIZEM CD) 360 MG 24 hr capsule Take 1 capsule (360 mg total) by mouth daily. 30 capsule 5   furosemide (LASIX) 20 MG tablet Take 1 tablet (20 mg total) by mouth daily. 30 tablet 6   Lancets (FREESTYLE) lancets   12   letrozole (FEMARA) 2.5 MG tablet Take 1 tablet (2.5 mg total) by mouth daily. 90 tablet 3   losartan (COZAAR) 50 MG tablet Take 1 tablet (50 mg  total) by mouth daily. 90 tablet 2   nystatin-triamcinolone ointment (MYCOLOG) Apply 1 application topically 2 (two) times daily. 30 g 0   pravastatin (PRAVACHOL) 40 MG tablet TAKE 1 TABLET BY MOUTH AT BEDTIME FOR CHOLESTEROL 90 tablet 1   Simethicone (GAS-X PO) Take 2 tablets by mouth daily as needed (bloating).     triamcinolone cream (KENALOG) 0.1 % Apply 1 application topically daily as needed (itching).  2   No current facility-administered medications for this encounter.    Allergies  Allergen Reactions   Buspar [Buspirone]     dysphoria   Lipitor [Atorvastatin]     Myalgias   Metformin And Related     Gas/bloating   Nsaids      GI upset   Sulfa Antibiotics Other (See Comments)    Reaction=throat itching   Sulfonamide Derivatives     Social History   Socioeconomic History   Marital status: Widowed    Spouse name: Not on file   Number of children: Not on file   Years of education: Not on file   Highest education level: Not on file  Occupational History   Occupation: retired  Tobacco Use   Smoking status: Former    Packs/day: 0.50    Years: 25.00    Pack years: 12.50    Types: Cigarettes    Quit date: 07/30/2006    Years since quitting: 14.9   Smokeless tobacco: Never   Tobacco comments:    Former smoker 07/04/2021  Substance and Sexual Activity   Alcohol use: Not Currently    Alcohol/week: 2.0 standard drinks    Types: 2 Standard drinks or equivalent per week    Comment: occasional, none in 3 months   Drug use: Never   Sexual activity: Not on file  Other Topics Concern   Not on file  Social History Narrative   Not on file   Social Determinants of Health   Financial Resource Strain: Low Risk    Difficulty of Paying Living Expenses: Not very hard  Food Insecurity: No Food Insecurity   Worried About Charity fundraiser in the Last Year: Never true   [place] in the Last Year: Never true  Transportation Needs: No Transportation Needs   Lack of Transportation (Medical): No   Lack of Transportation (Non-Medical): No  Physical Activity: Not on file  Stress: Not on file  Social Connections: Not on file  Intimate Partner Violence: Not on file     ROS- All systems are reviewed and negative except as per the HPI above.  Physical Exam: Vitals:   07/04/21 1017  BP: 124/72  Pulse: 77  Weight: 67 kg  Height: 5\' 5"  (1.651 m)    GEN- The patient is a well appearing elderly female, alert and oriented x 3 today.   HEENT-head normocephalic, atraumatic, sclera clear, conjunctiva pink, hearing intact, trachea midline. Lungs- Clear to ausculation bilaterally, normal work of  breathing Heart- Regular rate and rhythm, no murmurs, rubs or gallops  GI- soft, NT, ND, + BS Extremities- no clubbing, cyanosis, or edema MS- no significant deformity or atrophy Skin- no rash or lesion Psych- euthymic mood, full affect Neuro- strength and sensation are intact   Wt Readings from Last 3 Encounters:  07/04/21 67 kg  05/24/21 67.5 kg  04/13/21 66.3 kg    EKG today demonstrates  SR Vent. rate 77 BPM PR interval 144 ms QRS duration 78 ms QT/QTcB 378/427 ms  Echo 12/28/20 demonstrated  1.  Left ventricular ejection fraction, by estimation, is 65 to 70%. The  left ventricle has normal function. The left ventricle has no regional  wall motion abnormalities. There is mild concentric left ventricular  hypertrophy. Left ventricular diastolic parameters are indeterminate.   2. Right ventricular systolic function is normal. The right ventricular  size is normal. There is normal pulmonary artery systolic pressure. The estimated right ventricular systolic pressure is 24.2 mmHg.   3. The mitral valve is degenerative. No evidence of mitral valve  regurgitation. No evidence of mitral stenosis.   4. The aortic valve is tricuspid. Aortic valve regurgitation is not  visualized. No aortic stenosis is present.  Epic records are reviewed at length today  CHA2DS2-VASc Score = 5  The patient's score is based upon: CHF History: 0 HTN History: 1 Diabetes History: 1 Stroke History: 0 Vascular Disease History: 0 Age Score: 2 Gender Score: 1      ASSESSMENT AND PLAN: 1. Paroxysmal Atrial Fibrillation (ICD10:  I48.0) The patient's CHA2DS2-VASc score is 5, indicating a 7.2% annual risk of stroke.   Patient appears to be maintaining SR. Continue diltiazem 360 mg daily Continue Eliquis 5 mg BID  2. Secondary Hypercoagulable State (ICD10:  D68.69) The patient is at significant risk for stroke/thromboembolism based upon her CHA2DS2-VASc Score of 5.  Continue Apixaban (Eliquis).    3. HTN Stable, no changes today.   Follow up in the AF clinic in 6 months.    [facility] [address] [place], [place] [address] [phone] 07/04/2021 4:47 PM

## 2021-07-28 ENCOUNTER — Ambulatory Visit: Payer: Self-pay | Admitting: *Deleted

## 2021-07-28 NOTE — Telephone Encounter (Signed)
Summary: advice- back pain   Pt stated she is not sure what to do, if she needs to schedule another appt or try to find a chiropractic, pt has been having back pain starting before Xmas, pt has video appt with Dr Wynetta Emery 1/16 but wanted some advice before then.      Chief Complaint: back pain Symptoms: pain feels like electricity on the left lower back, above buttock Frequency: comes and goes x 2 weeks, today the pain seems to be better Pertinent Negatives: Patient denies any numbness, tingling, loss of bladder/bowel control Disposition: [] ED /[] Urgent Care (no appt availability in office) / [x] Appointment(In office/virtual)/ []  [facility]/ [] Home Care/ [] Refused Recommended Disposition /[] [facility] [place] [facility]/ []  Follow-up with PCP Additional Notes: Patient asking if Dr. Wynetta Emery would think it was ok to be seen by a chiropractor for back pain. Patient states daughter was seen by a chiropractor on the corner of [address] that helped to find cancer diagnosis and thought it may help to see one as well. Patient has appt previously scheduled on 08/14/20 but advised to go to [facility] for worsening pain.   Reason for Disposition  Back pain present > 2 weeks  Answer Assessment - Initial Assessment Questions 1. ONSET: "When did the pain begin?"     About 2 weeks ago  2. LOCATION: "Where does it hurt?" (upper, mid or lower back)     Lower part of back on the left right above  left buttocks 3. SEVERITY: "How bad is the pain?"  (e.g., Scale 1-10; mild, moderate, or severe)   - MILD (1-3): doesn't interfere with normal activities    - MODERATE (4-7): interferes with normal activities or awakens from sleep    - SEVERE (8-10): excruciating pain, unable to do any normal activities      5 4. PATTERN: "Is the pain constant?" (e.g., yes, no; constant, intermittent)      intermittent 5. RADIATION: "Does the pain shoot into your legs or elsewhere?"     Shoots across like  electricity 6. CAUSE:  "What do you think is causing the back pain?"      Unsure, thinks it may be due to taking christmas decorations out 7. BACK OVERUSE:  "Any recent lifting of heavy objects, strenuous work or exercise?"     May have been due to taking christmas decorations from walk in attic  8. MEDICATIONS: "What have you taken so far for the pain?" (e.g., nothing, acetaminophen, NSAIDS)     Tylenol 2-3 every day but it has only taken 1 today 9. NEUROLOGIC SYMPTOMS: "Do you have any weakness, numbness, or problems with bowel/bladder control?"     denies 10. OTHER SYMPTOMS: "Do you have any other symptoms?" (e.g., fever, abdominal pain, burning with urination, blood in urine)       denies 11. PREGNANCY: "Is there any chance you are pregnant?" (e.g., yes, no; LMP)       N/a  Protocols used: Back Pain-A-AH

## 2021-08-01 NOTE — Telephone Encounter (Signed)
Contacted pt to schedule an appt for this week per Dr. Wynetta Emery pt didn't answer lvm

## 2021-08-03 ENCOUNTER — Other Ambulatory Visit: Payer: Self-pay

## 2021-08-03 ENCOUNTER — Ambulatory Visit: Payer: Medicare Other | Attending: Internal Medicine | Admitting: Internal Medicine

## 2021-08-03 ENCOUNTER — Encounter: Payer: Self-pay | Admitting: Internal Medicine

## 2021-08-03 VITALS — BP 128/71 | HR 87 | Resp 16 | Wt 148.0 lb

## 2021-08-03 DIAGNOSIS — Z23 Encounter for immunization: Secondary | ICD-10-CM

## 2021-08-03 DIAGNOSIS — R269 Unspecified abnormalities of gait and mobility: Secondary | ICD-10-CM | POA: Diagnosis not present

## 2021-08-03 DIAGNOSIS — M545 Low back pain, unspecified: Secondary | ICD-10-CM | POA: Diagnosis not present

## 2021-08-03 MED ORDER — DICLOFENAC SODIUM 1 % EX GEL: 2.0000 g | Freq: Four times a day (QID) | CUTANEOUS | 1 refills | Status: DC | PRN

## 2021-08-03 MED ORDER — ZOSTER VAC RECOMB ADJUVANTED 50 MCG/0.5ML IM SUSR
0.5000 mL | Freq: Once | INTRAMUSCULAR | 1 refills | Status: AC
Start: 2021-08-03 — End: 2021-08-03

## 2021-08-03 NOTE — Progress Notes (Signed)
Patient ID: Ruth Delgado, female    DOB: 05-Apr-1935  MRN: [id number]  CC: Back Pain and Diabetes   Subjective: Ruth Delgado is a 87 y.o. female who presents for UC visit Her concerns today include:  Pt with hx of HTN, HL, PAF on anticoagulation, DM type 2, GERD, vit D def, DVT RT leg, gout, depression, gout,  DCIS RT breast  C/o LT lower back pain that started 2 wks before Christmas -She had lifted some boxes out of the attic with her Christmas decorations a day or 2 before the pain started.  She has not had any falls.  She is on an anticoagulant. -Initially pain was constant and was rated 6/10.  No radiation down the leg, no numbness or tingling.  Toes stay numb all the time in both feet but this is not new. No weakness or dizziness.  No falls.  She does feel a bit unsteady on her feet when she gets out of bed in the mornings since she has been having the back pain.  She has been using a cane which she had used in the past.  She would intermittently get a sharp lightening like pain in the left lower back that lasts only a quick second when she sits down or is about to get up. -She has been taking Tylenol 500 mg 3-4 times a day and laying on a heating pad at night.  Both have been very helpful.  She has also been using an old Velcro back brace that she had from 20 years ago.  This also helps.  She now has to take the Tylenol only twice a day and has not had to use the heating pad as much over the past several days.  She feels her symptoms are getting better.  She now rates her pain at about a 4/10 Yesterday started having pain in LT leg concentrated around LT knee with swelling.  But that has since resolved  Since last visit with me, she has been diagnosed with DCIS of the right breast in October 2022.  She has seen the oncologist Dr. Lindi Adie and radiation oncology.  She has decided to forego surgery or radiation.  She is on antiestrogen therapy and will do more frequent mammogram follow-ups.   She was started on Femara the end of October.  She wonders whether that medicine has anything to do with her back pain.  Patient Active Problem List   Diagnosis Date Noted   Ductal carcinoma in situ (DCIS) of right breast 05/19/2021   Paroxysmal atrial fibrillation ([place]) 12/09/2020   Secondary hypercoagulable state ([place]) 12/09/2020   Atrial fibrillation with RVR ([place]) 11/15/2020   Nausea, vomiting and diarrhea 11/15/2020   CKD stage 2 due to type 2 diabetes mellitus ([place]) 03/06/2018   Overweight (BMI 25.0-29.9) 03/06/2018   Osteopenia 03/06/2018   Esophageal reflux 09/06/2015   Vitamin D deficiency 06/08/2013   Medication management 06/08/2013   Hypertension    Hyperlipemia    Gout    Type 2 diabetes mellitus with stage 3 chronic kidney disease, without long-term current use of insulin ([place])      Current Outpatient Medications on File Prior to Visit  Medication Sig Dispense Refill   acetaminophen (TYLENOL) 650 MG CR tablet Take 650 mg by mouth every 8 (eight) hours as needed for pain.     allopurinol (ZYLOPRIM) 300 MG tablet TAKE 1 TABLET BY MOUTH DAILY TO PREVENT GOUT 90 tablet 3   apixaban (ELIQUIS) 5  MG TABS tablet Take 1 tablet (5 mg total) by mouth 2 (two) times daily. 180 tablet 1   cholecalciferol (VITAMIN D) 1000 UNITS tablet Take 5,000 Units by mouth every other day.      citalopram (CELEXA) 20 MG tablet Take 1 tablet (20 mg total) by mouth daily. 30 tablet 2   diltiazem (CARDIZEM CD) 360 MG 24 hr capsule Take 1 capsule (360 mg total) by mouth daily. 30 capsule 5   furosemide (LASIX) 20 MG tablet Take 1 tablet (20 mg total) by mouth daily. 30 tablet 6   Lancets (FREESTYLE) lancets   12   letrozole (FEMARA) 2.5 MG tablet Take 1 tablet (2.5 mg total) by mouth daily. 90 tablet 3   losartan (COZAAR) 50 MG tablet Take 1 tablet (50 mg total) by mouth daily. 90 tablet 2   nystatin-triamcinolone ointment (MYCOLOG) Apply 1 application topically 2 (two) times daily. 30 g 0    pravastatin (PRAVACHOL) 40 MG tablet TAKE 1 TABLET BY MOUTH AT BEDTIME FOR CHOLESTEROL 90 tablet 1   Simethicone (GAS-X PO) Take 2 tablets by mouth daily as needed (bloating).     triamcinolone cream (KENALOG) 0.1 % Apply 1 application topically daily as needed (itching).  2   No current facility-administered medications on file prior to visit.    Allergies  Allergen Reactions   Buspar [Buspirone]     dysphoria   Lipitor [Atorvastatin]     Myalgias   Metformin And Related     Gas/bloating   Nsaids     GI upset   Sulfa Antibiotics Other (See Comments)    Reaction=throat itching   Sulfonamide Derivatives     Social History   Socioeconomic History   Marital status: Widowed    Spouse name: Not on file   Number of children: Not on file   Years of education: Not on file   Highest education level: Not on file  Occupational History   Occupation: retired  Tobacco Use   Smoking status: Former    Packs/day: 0.50    Years: 25.00    Pack years: 12.50    Types: Cigarettes    Quit date: 07/30/2006    Years since quitting: 15.0   Smokeless tobacco: Never   Tobacco comments:    Former smoker 07/04/2021  Substance and Sexual Activity   Alcohol use: Not Currently    Alcohol/week: 2.0 standard drinks    Types: 2 Standard drinks or equivalent per week    Comment: occasional, none in 3 months   Drug use: Never   Sexual activity: Not on file  Other Topics Concern   Not on file  Social History Narrative   Not on file   Social Determinants of Health   Financial Resource Strain: Low Risk    Difficulty of Paying Living Expenses: Not very hard  Food Insecurity: No Food Insecurity   Worried About Charity fundraiser in the Last Year: Never true   [place] in the Last Year: Never true  Transportation Needs: No Transportation Needs   Lack of Transportation (Medical): No   Lack of Transportation (Non-Medical): No  Physical Activity: Not on file  Stress: Not on file  Social  Connections: Not on file  Intimate Partner Violence: Not on file    Family History  Problem Relation Age of Onset   Diabetes Mother        suicide at age 45   Hypertension Father    CVA Father  Past Surgical History:  Procedure Laterality Date   ABDOMINAL HYSTERECTOMY     BIOPSY BREAST     (L) BREAST IN 1993   KNEE ARTHROSCOPY      ROS: Review of Systems Negative except as stated above  PHYSICAL EXAM: BP 128/71    Pulse 87    Resp 16    Wt 148 lb (67.1 kg)    SpO2 96%    BMI 24.63 kg/m   Physical Exam  General appearance - alert, well appearing, elderly Caucasian female and in no distress Mental status - normal mood, behavior, speech, dress, motor activity, and thought processes Neurological -gross sensation intact in the lower extremities.  Brisk knee and ankle jerk reflexes bilaterally.  Power legs distally 5/5.  Proximally 4+/5 left, 5/5 right.  Straight leg raise on the left side causes mild discomfort in the lower back. Musculoskeletal -she has good range of motion of the left hip but with some discomfort in the groin area with rotation.  No tenderness on palpation of the lumbar spine or surrounding paraspinal muscles. She holds onto the wall when she first stands up to steady herself before she starts to walk.  She does not have a cane with her.  CMP Latest Ref Rng & Units 05/24/2021 02/28/2021 12/21/2020  Glucose 70 - 99 mg/dL 118(H) 107(H) 103(H)  BUN 8 - 23 mg/dL 18 17 13   Creatinine 0.44 - 1.00 mg/dL 0.86 0.84 0.81  Sodium 135 - 145 mmol/L 141 143 141  Potassium 3.5 - 5.1 mmol/L 4.5 4.2 4.2  Chloride 98 - 111 mmol/L 102 100 99  CO2 22 - 32 mmol/L 27 25 27   Calcium 8.9 - 10.3 mg/dL 9.5 9.3 9.2  Total Protein 6.5 - 8.1 g/dL 7.3 - -  Total Bilirubin 0.3 - 1.2 mg/dL 0.6 - -  Alkaline Phos 38 - 126 U/L 54 - -  AST 15 - 41 U/L 20 - -  ALT 0 - 44 U/L 15 - -   Lipid Panel     Component Value Date/Time   CHOL 179 09/02/2020 1035   TRIG 142 09/02/2020 1035   HDL  56 09/02/2020 1035   CHOLHDL 3.2 09/02/2020 1035   VLDL 28 01/14/2017 1114   LDLCALC 99 09/02/2020 1035    CBC    Component Value Date/Time   WBC 6.9 05/24/2021 0808   WBC 7.4 12/01/2020 1615   RBC 4.06 05/24/2021 0808   HGB 13.4 05/24/2021 0808   HGB 13.2 11/28/2020 1229   HCT 39.7 05/24/2021 0808   HCT 37.7 11/28/2020 1229   PLT 190 05/24/2021 0808   PLT 274 11/28/2020 1229   MCV 97.8 05/24/2021 0808   MCV 97 11/28/2020 1229   MCH 33.0 05/24/2021 0808   MCHC 33.8 05/24/2021 0808   RDW 13.8 05/24/2021 0808   RDW 13.5 11/28/2020 1229   LYMPHSABS 2.4 05/24/2021 0808   MONOABS 0.5 05/24/2021 0808   EOSABS 0.1 05/24/2021 0808   BASOSABS 0.1 05/24/2021 0808    ASSESSMENT AND PLAN: 1. Acute left-sided low back pain without sciatica This may have been a strain versus nerve irritation from lifting of boxes.  Symptoms not classic for sciatica.  Given that she is improving, I recommend that she continues the Tylenol 500 mg as needed with her heating pad.  I have prescribed some Voltaren gel for her to use topically.  Gait is a little unstable when she first stands up so I recommend that she use her cane on the  affected side.  She is agreeable to seeing a physical therapist. -Informed her that the marijuana can cause musculoskeletal/joint pains especially if someone already has pre-existing arthritis.  I doubt it led to this episode that she is having, but recommends that she mentions it to her oncologist when she sees him next week. - CBC - DG Lumbar Spine Complete; Future - Ambulatory referral to Physical Therapy - diclofenac Sodium (VOLTAREN) 1 % GEL; Apply 2 g topically 4 (four) times daily as needed.  Dispense: 100 g; Refill: 1  2. Gait disturbance - Ambulatory referral to Physical Therapy  3. Need for shingles vaccine Patient given prescription to get her shingles vaccine.  We have discussed getting the shingles vaccine on last visit but have deferred until this visit.  Advised  that the vaccine can cause some swelling and redness at the injection site. - Zoster Vaccine Adjuvanted [facility]) injection; Inject 0.5 mLs into the muscle once for 1 dose.  Dispense: 0.5 mL; Refill: 1    Patient was given the opportunity to ask questions.  Patient verbalized understanding of the plan and was able to repeat key elements of the plan.   Orders Placed This Encounter  Procedures   DG Lumbar Spine Complete   CBC   Ambulatory referral to Physical Therapy     Requested Prescriptions   Signed Prescriptions Disp Refills   Zoster Vaccine Adjuvanted [facility]) injection 0.5 mL 1    Sig: Inject 0.5 mLs into the muscle once for 1 dose.   diclofenac Sodium (VOLTAREN) 1 % GEL 100 g 1    Sig: Apply 2 g topically 4 (four) times daily as needed.    Return in about 1 month (around 09/03/2021).  Karle Plumber, MD, FACP

## 2021-08-03 NOTE — Patient Instructions (Signed)
Please use your cane until your back is better.  I have sent the prescription to your pharmacy for Voltaren gel to use 4 times a day as needed.  This is an anti-inflammatory gel.  You can continue to use the Tylenol and heating pad as needed.  I have submitted a referral for you to have some physical therapy.

## 2021-08-04 LAB — CBC
Hematocrit: 40.8 % (ref 34.0–46.6)
Hemoglobin: 13.7 g/dL (ref 11.1–15.9)
MCH: 32.6 pg (ref 26.6–33.0)
MCHC: 33.6 g/dL (ref 31.5–35.7)
MCV: 97 fL (ref 79–97)
Platelets: 192 10*3/uL (ref 150–450)
RBC: 4.2 x10E6/uL (ref 3.77–5.28)
RDW: 13 % (ref 11.7–15.4)
WBC: 6.8 10*3/uL (ref 3.4–10.8)

## 2021-08-14 ENCOUNTER — Other Ambulatory Visit: Payer: Self-pay | Admitting: Internal Medicine

## 2021-08-14 ENCOUNTER — Telehealth: Payer: Medicare Other | Admitting: Internal Medicine

## 2021-08-14 DIAGNOSIS — I480 Paroxysmal atrial fibrillation: Secondary | ICD-10-CM

## 2021-08-14 NOTE — Telephone Encounter (Signed)
Requested Prescriptions  Pending Prescriptions Disp Refills   ELIQUIS 5 MG TABS tablet [Pharmacy Med Name: ELIQUIS 5 MG TABLET 5 Tablet] 60 tablet 0    Sig: TAKE 1 TABLET BY MOUTH 2 TIMES DAILY.     Hematology:  Anticoagulants Passed - 08/14/2021  9:19 AM      Passed - HGB in normal range and within 360 days    Hemoglobin  Date Value Ref Range Status  08/03/2021 13.7 11.1 - 15.9 g/dL Final         Passed - PLT in normal range and within 360 days    Platelets  Date Value Ref Range Status  08/03/2021 192 150 - 450 x10E3/uL Final         Passed - HCT in normal range and within 360 days    Hematocrit  Date Value Ref Range Status  08/03/2021 40.8 34.0 - 46.6 % Final         Passed - Cr in normal range and within 360 days    Creatinine  Date Value Ref Range Status  05/24/2021 0.86 0.44 - 1.00 mg/dL Final   Creat  Date Value Ref Range Status  12/01/2020 0.80 0.60 - 0.88 mg/dL Final    Comment:    For patients >83 years of age, the reference limit for Creatinine is approximately 13% higher for people identified as African-American. .    Creatinine, Urine  Date Value Ref Range Status  02/11/2020 104 20 - 275 mg/dL Final         Passed - Valid encounter within last 12 months    Recent Outpatient Visits          1 week ago Acute left-sided low back pain without sciatica   [place], MD   4 months ago Controlled type 2 diabetes with neuropathy [facility])   [place], MD   7 months ago Establishing care with new doctor, encounter for   [place], MD      Future Appointments            In 3 weeks Ladell Pier, MD [place]   In 3 weeks Donato Heinz, MD [facility] [place]

## 2021-08-15 ENCOUNTER — Ambulatory Visit (HOSPITAL_COMMUNITY)
Admission: RE | Admit: 2021-08-15 | Discharge: 2021-08-15 | Disposition: A | Discharge status: Home / Self Care | Payer: Medicare Other | Source: Ambulatory Visit | Attending: Internal Medicine | Admitting: Internal Medicine

## 2021-08-15 ENCOUNTER — Other Ambulatory Visit: Payer: Self-pay

## 2021-08-15 DIAGNOSIS — M545 Low back pain, unspecified: Secondary | ICD-10-CM | POA: Insufficient documentation

## 2021-08-16 ENCOUNTER — Telehealth: Payer: Self-pay | Admitting: Internal Medicine

## 2021-08-16 NOTE — Telephone Encounter (Signed)
Phone call placed to patient this morning to go over the results of the x-ray of her back.  Patient informed that the x-ray showed arthritis changes and also degenerative disc changes.  She also has mild anterior listhesis of L4 on L5.  I explained to her what that meant. Patient reports that her back is still a little sore but much improved compared to what it was initially.  She now takes the Tylenol every other day whereas initially she was having to take it every day.  She still uses her cane when she goes outside to her mailbox but otherwise states she gets along well without it in the house.  I inquired whether she has been called by physical therapy as yet.  Patient stated that she had not.  I looked in referrals and saw that they did try to call her on 08/07/2021 but the phone constantly ring without answer.  I gave patient the phone number for [facility] outpatient rehab physical therapy so that she can call them back and get scheduled.  LUMBAR SPINE - COMPLETE 4+ VIEW   COMPARISON:  Lumbar spine x-ray 11/19/2016.   FINDINGS: The bones are osteopenic. There is no acute fracture identified. There is stable 6 mm of anterolisthesis at L4-L5. Alignment is otherwise anatomic. There is mild disc space narrowing and endplate osteophyte formation at T12-L1, L1-L2, L4-L5 and L5-S1 which appears similar to the prior study. There are atherosclerotic calcifications of the aorta.   IMPRESSION: 1. No acute fracture or dislocation. 2. Stable grade 1 anterolisthesis at L4-L5 with multilevel degenerative changes.     Electronically Signed   By: Ronney Asters M.D.   On: 08/15/2021 21:50

## 2021-08-23 NOTE — Assessment & Plan Note (Signed)
October 2022:Screening mammogram: indeterminate linear calcifications in the right breast. Diagnostic mammogram and [place]: 1 cm group of fine linear branching calcifications.  Stereotactic biopsy: Low-grade DCIS, ER 100%, PR 95%  Recommendation: 1.  Neoadjuvant antiestrogen therapy with tamoxifen 2.  patient did not wish to undergo surgery --------------------------------------------------------------------------------------------------------------------------------- Tamoxifen toxicities:  Mammograms to be done every 6 months.  Next mammogram will be done April 2023

## 2021-08-23 NOTE — Progress Notes (Signed)
Patient Care Team: Ladell Pier, MD as PCP - General (Internal Medicine) Jacelyn Pi, MD as Consulting Physician (Endocrinology) Rockwell Germany, RN as Oncology Nurse Navigator Mauro Kaufmann, RN as Oncology Nurse Navigator Donnie Mesa, MD as Consulting Physician (General Surgery) Nicholas Lose, MD as Consulting Physician (Hematology and Oncology) Eppie Gibson, MD as Attending Physician (Radiation Oncology)  DIAGNOSIS:    ICD-10-CM   1. Ductal carcinoma in situ (DCIS) of right breast  D05.11       SUMMARY OF ONCOLOGIC HISTORY: Oncology History  Ductal carcinoma in situ (DCIS) of right breast  05/19/2021 Initial Diagnosis   Screening mammogram: indeterminate linear calcifications in the right breast. Diagnostic mammogram and [place]: 1 cm group of fine linear branching calcifications.  Stereotactic biopsy: Low-grade DCIS, ER 100%, PR 95%   05/24/2021 Cancer Staging   Staging form: Breast, AJCC 8th Edition - Clinical stage from 05/24/2021: Stage 0 (cTis (DCIS), cN0, cM0, G1, ER+, PR+, HER2: Not Assessed) - Signed by Nicholas Lose, MD on 05/24/2021 Histologic grading system: 3 grade system      CHIEF COMPLIANT: Follow-up of right breast DCIS  INTERVAL HISTORY: Ruth Delgado is a 87 y.o. with above-mentioned history of right breast DCIS. She presents to the clinic for follow-up.  She is tolerating tamoxifen reasonably well without any problems or concerns.  She does have profuse sweats which do not seem to be bothering her but when she wakes up in the morning she seems to be sweating a lot.  Occasional twinges of discomfort in the breast but otherwise no lumps or nodules.  ALLERGIES:  is allergic to buspar [buspirone], lipitor [atorvastatin], metformin and related, nsaids, sulfa antibiotics, and sulfonamide derivatives.  MEDICATIONS:  Current Outpatient Medications  Medication Sig Dispense Refill   acetaminophen (TYLENOL) 650 MG CR tablet Take 650 mg by mouth every  8 (eight) hours as needed for pain.     allopurinol (ZYLOPRIM) 300 MG tablet TAKE 1 TABLET BY MOUTH DAILY TO PREVENT GOUT 90 tablet 3   cholecalciferol (VITAMIN D) 1000 UNITS tablet Take 5,000 Units by mouth every other day.      citalopram (CELEXA) 20 MG tablet Take 1 tablet (20 mg total) by mouth daily. 30 tablet 2   diclofenac Sodium (VOLTAREN) 1 % GEL Apply 2 g topically 4 (four) times daily as needed. 100 g 1   diltiazem (CARDIZEM CD) 360 MG 24 hr capsule Take 1 capsule (360 mg total) by mouth daily. 30 capsule 5   ELIQUIS 5 MG TABS tablet TAKE 1 TABLET BY MOUTH 2 TIMES DAILY. 60 tablet 0   furosemide (LASIX) 20 MG tablet Take 1 tablet (20 mg total) by mouth daily. 30 tablet 6   Lancets (FREESTYLE) lancets   12   letrozole (FEMARA) 2.5 MG tablet Take 1 tablet (2.5 mg total) by mouth daily. 90 tablet 3   losartan (COZAAR) 50 MG tablet Take 1 tablet (50 mg total) by mouth daily. 90 tablet 2   nystatin-triamcinolone ointment (MYCOLOG) Apply 1 application topically 2 (two) times daily. 30 g 0   pravastatin (PRAVACHOL) 40 MG tablet TAKE 1 TABLET BY MOUTH AT BEDTIME FOR CHOLESTEROL 90 tablet 1   Simethicone (GAS-X PO) Take 2 tablets by mouth daily as needed (bloating).     triamcinolone cream (KENALOG) 0.1 % Apply 1 application topically daily as needed (itching).  2   No current facility-administered medications for this visit.    PHYSICAL EXAMINATION: ECOG PERFORMANCE STATUS: 1 - Symptomatic but completely  ambulatory  Vitals:   08/24/21 1123  BP: (!) 154/75  Pulse: 79  Resp: 18  Temp: 97.7 F (36.5 C)  SpO2: 99%   Filed Weights   08/24/21 1123  Weight: 147 lb 3.2 oz (66.8 kg)    BREAST: No palpable masses or nodules in either right or left breasts. No palpable axillary supraclavicular or infraclavicular adenopathy no breast tenderness or nipple discharge. (exam performed in the presence of a chaperone)  LABORATORY DATA:  I have reviewed the data as listed CMP Latest Ref Rng &  Units 05/24/2021 02/28/2021 12/21/2020  Glucose 70 - 99 mg/dL 118(H) 107(H) 103(H)  BUN 8 - 23 mg/dL '18 17 13  ' Creatinine 0.44 - 1.00 mg/dL 0.86 0.84 0.81  Sodium 135 - 145 mmol/L 141 143 141  Potassium 3.5 - 5.1 mmol/L 4.5 4.2 4.2  Chloride 98 - 111 mmol/L 102 100 99  CO2 22 - 32 mmol/L '27 25 27  ' Calcium 8.9 - 10.3 mg/dL 9.5 9.3 9.2  Total Protein 6.5 - 8.1 g/dL 7.3 - -  Total Bilirubin 0.3 - 1.2 mg/dL 0.6 - -  Alkaline Phos 38 - 126 U/L 54 - -  AST 15 - 41 U/L 20 - -  ALT 0 - 44 U/L 15 - -    Lab Results  Component Value Date   WBC 6.8 08/03/2021   HGB 13.7 08/03/2021   HCT 40.8 08/03/2021   MCV 97 08/03/2021   PLT 192 08/03/2021   NEUTROABS 3.9 05/24/2021    ASSESSMENT & PLAN:  Ductal carcinoma in situ (DCIS) of right breast October 2022:Screening mammogram: indeterminate linear calcifications in the right breast. Diagnostic mammogram and [place]: 1 cm group of fine linear branching calcifications.  Stereotactic biopsy: Low-grade DCIS, ER 100%, PR 95%  Recommendation: 1.  Neoadjuvant antiestrogen therapy with tamoxifen 2.  patient did not wish to undergo surgery --------------------------------------------------------------------------------------------------------------------------------- Tamoxifen toxicities: Sweating every morning: She will start taking tamoxifen at bedtime. No other side effects to tamoxifen. Intermittent breast twinges: Nothing to be worried about at this time.   Next mammogram will be done April 2023  Back pain: Related to moving boxes during Christmas.  She is going to get physical therapy. Follow-up after the mammogram to discuss results.   No orders of the defined types were placed in this encounter.  The patient has a good understanding of the overall plan. she agrees with it. she will call with any problems that may develop before the next visit here.  Total time spent: 20 mins including face to face time and time spent for planning, charting  and coordination of care  Rulon Eisenmenger, MD, MPH 08/24/2021  I, Thana Ates, am acting as scribe for Dr. Nicholas Lose.  I have reviewed the above documentation for accuracy and completeness, and I agree with the above.

## 2021-08-24 ENCOUNTER — Inpatient Hospital Stay: Payer: Medicare Other | Attending: Hematology and Oncology | Admitting: Hematology and Oncology

## 2021-08-24 ENCOUNTER — Other Ambulatory Visit: Payer: Self-pay

## 2021-08-24 DIAGNOSIS — R61 Generalized hyperhidrosis: Secondary | ICD-10-CM | POA: Diagnosis not present

## 2021-08-24 DIAGNOSIS — Z882 Allergy status to sulfonamides status: Secondary | ICD-10-CM | POA: Insufficient documentation

## 2021-08-24 DIAGNOSIS — Z7901 Long term (current) use of anticoagulants: Secondary | ICD-10-CM | POA: Insufficient documentation

## 2021-08-24 DIAGNOSIS — Z79899 Other long term (current) drug therapy: Secondary | ICD-10-CM | POA: Insufficient documentation

## 2021-08-24 DIAGNOSIS — D0511 Intraductal carcinoma in situ of right breast: Secondary | ICD-10-CM | POA: Insufficient documentation

## 2021-08-24 DIAGNOSIS — Z886 Allergy status to analgesic agent status: Secondary | ICD-10-CM | POA: Diagnosis not present

## 2021-08-25 ENCOUNTER — Other Ambulatory Visit: Payer: Self-pay | Admitting: Cardiology

## 2021-08-29 NOTE — Therapy (Addendum)
[facility] EVALUATION/DISCHARGE   Patient Name: Ruth Delgado MRN: [id number] DOB:04/15/1935, 87 y.o., female Today's Date: 08/30/2021   PT End of Session - 08/30/21 1333     Visit Number 1    Number of Visits 12    Date for PT Re-Evaluation 10/11/21    PT Start Time 1330    PT Stop Time 1415    PT Time Calculation (min) 45 min    Activity Tolerance Patient tolerated treatment well    Behavior During Therapy [facility] for tasks assessed/performed             Past Medical History:  Diagnosis Date   A-fib ([place])    Anxiety    Arthritis    Breast cancer ([place])    Diabetes mellitus    Gout    Hyperlipemia    Hypertension    Past Surgical History:  Procedure Laterality Date   ABDOMINAL HYSTERECTOMY     BIOPSY BREAST     (L) BREAST IN 1993   KNEE ARTHROSCOPY     Patient Active Problem List   Diagnosis Date Noted   Ductal carcinoma in situ (DCIS) of right breast 05/19/2021   Paroxysmal atrial fibrillation ([place]) 12/09/2020   Secondary hypercoagulable state ([place]) 12/09/2020   Atrial fibrillation with RVR ([place]) 11/15/2020   Nausea, vomiting and diarrhea 11/15/2020   CKD stage 2 due to type 2 diabetes mellitus ([place]) 03/06/2018   Overweight (BMI 25.0-29.9) 03/06/2018   Osteopenia 03/06/2018   Esophageal reflux 09/06/2015   Vitamin D deficiency 06/08/2013   Medication management 06/08/2013   Hypertension    Hyperlipemia    Gout    Type 2 diabetes mellitus with stage 3 chronic kidney disease, without long-term current use of insulin ([place])     PCP: Ladell Pier, MD  REFERRING PROVIDER: Ladell Pier, MD  REFERRING DIAG: M54.50 (ICD-10-CM) - Acute left-sided low back pain without sciatica R26.9 (ICD-10-CM) - Gait disturbance   THERAPY DIAG:  Acute left-sided low back pain, unspecified whether sciatica present  Other abnormalities of gait and mobility  ONSET DATE: Dec 2022  SUBJECTIVE:                                                                                                                                                                                            SUBJECTIVE STATEMENT: Pt here for PT for her L side of her low back.  She has difficulty getting out of bed in AM due to pain and stiffness, "until she is situated then she ok".  It is hard to bend forward to lift laundry from the dryer.  Pain increases in the evenings 4-5 o'clock .  She has difficulty walking long distances or sitting too long ( 1 hour).  The doctor says I walk kind of differently.  I don't use the cane anymore it has gotten a lot better. I didn't even take meds the past few days.  I have had so many doctors appt the past month.   From previous provider  C/o LT lower back pain that started 2 wks before Christmas -She had lifted some boxes out of the attic with her Christmas decorations a day or 2 before the pain started.  She has not had any falls.  She is on an anticoagulant. -Initially pain was constant and was rated 6/10.  No radiation down the leg, no numbness or tingling.  Toes stay numb all the time in both feet but this is not new. No weakness or dizziness.  No falls.  She does feel a bit unsteady on her feet when she gets out of bed in the mornings since she has been having the back pain.  She has been using a cane which she had used in the past.  She would intermittently get a sharp lightening like pain in the left lower back that lasts only a quick second when she sits down or is about to get up. -She has been taking Tylenol 500 mg 3-4 times a day and laying on a heating pad at night.  Both have been very helpful.  She has also been using an old Velcro back brace that she had from 20 years ago.  This also helps.  She now has to take the Tylenol only twice a day and has not had to use the heating pad as much over the past several days.  She feels her symptoms are getting better.  She now rates her pain at about a 4/10  PERTINENT HISTORY:   Pt with hx of HTN, HL, PAF on anticoagulation, DM type 2, GERD, vit D def, DVT RT leg, gout, depression, gout,  DCIS RT breast   PAIN:  Are you having pain? Yes NPRS scale: 4/10 Pain location: Lt low back Pain orientation: Left and Lower  PAIN TYPE: aching and tight Pain description: dull  Aggravating factors: standing, AM and later in the day  Relieving factors: heating pad 3 x per week, putting pressure on it, Epsom salt, OTC meds Tylenol   PRECAUTIONS: Other: CANCER  WEIGHT BEARING RESTRICTIONS No  FALLS:  Has patient fallen in last 6 months? 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  Widow 2017  PATIENT GOALS  Pain relief   OBJECTIVE:   DIAGNOSTIC FINDINGS:  XR   PATIENT SURVEYS:  FOTO NT may not return  SCREENING FOR RED FLAGS: Bowel or bladder incontinence: No Spinal tumors: No Cauda equina syndrome: No Compression fracture: No Abdominal aneurysm: No  COGNITION:  Overall cognitive status: Within functional limits for tasks assessed     SENSATION:  Light touch: Appears intact  Stereognosis: Appears intact  Hot/Cold: Appears intact  Proprioception: Appears intact  POSTURE:  Rounded shoulders, forward head, thoracic kyphosis  PALPATION: Spasm L paraspinals, generally  sore throughout T-L spine   LUMBARAROM/PROM  A/PROM A/PROM  08/30/2021  Flexion WNL no pain  Extension WNL no pain  Right lateral flexion Stretch on L  Left lateral flexion Stretch on R  Right rotation 25%  Left rotation 25%   (Blank rows = not tested)  LE AROM/PROM:  WNL Good  hamstring length   LE MMT:  MMT Right 08/30/2021 Left 08/30/2021  Hip flexion 4/5 4/5  Hip extension    Hip abduction    Hip adduction    Hip internal rotation    Hip external rotation    Knee flexion 5/5 5/5  Knee extension 5/5 5/5  Ankle dorsiflexion 5/5 5/5  Ankle plantarflexion    Ankle inversion    Ankle eversion     (Blank rows = not tested)  LUMBAR SPECIAL TESTS:  Neg SLR Neg Slump   FUNCTIONAL TESTS:  5 times sit to stand: 10 sec   GAIT: Distance walked: 150 Assistive device utilized:  none Level of assistance: Modified independence Comments: relatively slow pace, mild drift to Rt but steady, due to novel environment and distraction     TODAY'S TREATMENT  Assessment, XR results/anatomy, basic HEP, POC    PATIENT EDUCATION:  Education details: See above for education  Person educated: Patient Education method: Explanation, Demonstration, Tactile cues, Verbal cues, and Handouts Education comprehension: verbalized understanding and needs further education   HOME EXERCISE PROGRAM: Access Code: [email or web address] URL: [email or web address] Date: 08/30/2021 Prepared by: Raeford Razor  Exercises Supine Lower Trunk Rotation - 2 x daily - 7 x weekly - 2 sets - 3 reps - 30 hold Hooklying Single Knee to Chest Stretch - 2 x daily - 7 x weekly - 2 sets - 3 reps - 30 hold Supine Posterior Pelvic Tilt - 2 x daily - 7 x weekly - 2 sets - 10 reps - 5 hold Supine March - 2 x daily - 7 x weekly - 2 sets - 10 reps - 5 hold   ASSESSMENT:  CLINICAL IMPRESSION: Patient is a 87 y.o. female who was seen today for physical therapy evaluation and treatment for L sided low back pain, acute onset without radiculopathy. Objective impairments include decreased balance, decreased mobility, difficulty walking, decreased ROM, decreased strength, increased fascial restrictions, postural dysfunction, and pain. These impairments are limiting patient from cleaning, community activity,  meal prep, and laundry. Personal factors including Age and 3+ comorbidities: Cancer, Staph infection, diabetes  are also affecting patient's functional outcome. Patient will benefit from skilled PT to address above impairments and improve overall function. She is improving, however, and she wishes to try the HEP at home for a few weeks due to multiple medical appt and financial issues. Ensocuarsged her to call office and check in within a couple of weeks for an update.   REHAB POTENTIAL: Excellent  CLINICAL DECISION MAKING: Stable/uncomplicated  EVALUATION COMPLEXITY: Low   GOALS: Goals reviewed with patient? No  LONG TERM GOALS:   LTG Name Target Date Goal status  1 Patient will be I with HEP for low back flexibilty and strength  Baseline: 09/27/2021 INITIAL  2 Pt will be able to stand and walk for 30 min with pain in low back < 2/10  Baseline: 09/27/2021 INITIAL  3 Pt will be able to show good body mechanics for lifting items/laundry  Baseline: 09/27/2021 INITIAL  4 Further goals TBA if patient returns  Baseline:  09/27/2021 INITIAL  PLAN: PT FREQUENCY:  may do 1 x per week if needed  after 2 week trial of HEP   PT DURATION: 4 weeks  PLANNED INTERVENTIONS: Therapeutic exercises, Therapeutic activity, Neuro Muscular re-education, Balance training, Gait training, Patient/Family education, Joint mobilization, Cryotherapy, Moist heat, and Manual therapy  PLAN FOR NEXT SESSION: if returns, review HEP and body mechanics for lifting , further gait/balance assessment     Raeford Razor, PT 08/30/21 7:17 PM Phone: [phone] Fax: [phone]  Ruth Delgado, PT 08/30/2021, 1:34 PM

## 2021-08-30 ENCOUNTER — Other Ambulatory Visit: Payer: Self-pay

## 2021-08-30 ENCOUNTER — Ambulatory Visit: Payer: Medicare Other | Attending: Internal Medicine | Admitting: Physical Therapy

## 2021-08-30 DIAGNOSIS — R2689 Other abnormalities of gait and mobility: Secondary | ICD-10-CM | POA: Insufficient documentation

## 2021-08-30 DIAGNOSIS — M545 Low back pain, unspecified: Secondary | ICD-10-CM | POA: Insufficient documentation

## 2021-09-03 NOTE — Progress Notes (Signed)
Cardiology Office Note:    Date:  09/05/2021   ID:  Ruth Delgado, DOB 03-29-1935, MRN [id number]  PCP:  Ladell Pier, MD  Cardiologist:  None  Electrophysiologist:  None   Referring MD: Ladell Pier, MD   Chief Complaint  Patient presents with   Follow-up    6 months.   Atrial Fibrillation    History of Present Illness:    Ruth Delgado is a 87 y.o. female with a hx of atrial fibrillation, T2DM, hypertension, hyperlipidemia, gout who is referred by Dr. Melford Aase for evaluation of atrial fibrillation.  She was admitted to [facility] from 4/19 through 11/18/2020.  She presented with sudden onset nausea/vomiting/diarrhea.  On the ED she went into A. fib with RVR and was started on diltiazem drip.  This was transitioned to p.o. diltiazem and she was started on Eliquis 5 mg twice daily.  Zio patch x2 weeks on 12/22/2020 showed AF burden 4%, average rate 150, with longest episode lasting 2 hours 46 minutes.  Echocardiogram 12/28/2020 showed normal biventricular function, no significant valvular disease.  Since last clinic visit, she reports that she has been doing okay.  Recently has been having issues with back pain and started physical therapy.  She is taking Eliquis, denies any bleeding issues.  No falls.  Denies any chest pain, dyspnea, lightheadedness, syncope, lower extremity edema, or palpitations.     Past Medical History:  Diagnosis Date   A-fib ([place])    Anxiety    Arthritis    Breast cancer ([place])    Diabetes mellitus    Gout    Hyperlipemia    Hypertension     Past Surgical History:  Procedure Laterality Date   ABDOMINAL HYSTERECTOMY     BIOPSY BREAST     (L) BREAST IN 1993   KNEE ARTHROSCOPY      Current Medications: Current Meds  Medication Sig   acetaminophen (TYLENOL) 650 MG CR tablet Take 650 mg by mouth every 8 (eight) hours as needed for pain.   allopurinol (ZYLOPRIM) 300 MG tablet TAKE 1 TABLET BY MOUTH DAILY TO PREVENT GOUT   apixaban (ELIQUIS) 5  MG TABS tablet Take 1 tablet (5 mg total) by mouth 2 (two) times daily.   cholecalciferol (VITAMIN D) 1000 UNITS tablet Take 5,000 Units by mouth every other day.    citalopram (CELEXA) 20 MG tablet Take 1 tablet (20 mg total) by mouth daily.   diclofenac Sodium (VOLTAREN) 1 % GEL Apply 2 g topically 4 (four) times daily as needed.   diltiazem (CARDIZEM CD) 360 MG 24 hr capsule TAKE 1 CAPSULE (360 MG TOTAL) BY MOUTH DAILY.   furosemide (LASIX) 20 MG tablet Take 1 tablet (20 mg total) by mouth daily.   Lancets (FREESTYLE) lancets    letrozole (FEMARA) 2.5 MG tablet Take 1 tablet (2.5 mg total) by mouth daily.   losartan (COZAAR) 50 MG tablet Take 1 tablet (50 mg total) by mouth daily.   nystatin-triamcinolone ointment (MYCOLOG) Apply 1 application topically 2 (two) times daily.   pravastatin (PRAVACHOL) 40 MG tablet TAKE 1 TABLET BY MOUTH AT BEDTIME FOR CHOLESTEROL   Simethicone (GAS-X PO) Take 2 tablets by mouth daily as needed (bloating).     Allergies:   Buspar [buspirone], Lipitor [atorvastatin], Metformin and related, Nsaids, Sulfa antibiotics, and Sulfonamide derivatives   Social History   Socioeconomic History   Marital status: Widowed    Spouse name: Not on file   Number of children: Not on  file   Years of education: Not on file   Highest education level: Not on file  Occupational History   Occupation: retired  Tobacco Use   Smoking status: Former    Packs/day: 0.50    Years: 25.00    Pack years: 12.50    Types: Cigarettes    Quit date: 07/30/2006    Years since quitting: 15.1   Smokeless tobacco: Never   Tobacco comments:    Former smoker 07/04/2021  Substance and Sexual Activity   Alcohol use: Not Currently    Alcohol/week: 2.0 standard drinks    Types: 2 Standard drinks or equivalent per week    Comment: occasional, none in 3 months   Drug use: Never   Sexual activity: Not on file  Other Topics Concern   Not on file  Social History Narrative   Not on file    Social Determinants of Health   Financial Resource Strain: Low Risk    Difficulty of Paying Living Expenses: Not very hard  Food Insecurity: No Food Insecurity   Worried About Charity fundraiser in the Last Year: Never true   [place] in the Last Year: Never true  Transportation Needs: No Transportation Needs   Lack of Transportation (Medical): No   Lack of Transportation (Non-Medical): No  Physical Activity: Not on file  Stress: Not on file  Social Connections: Not on file     Family History: The patient's family history includes CVA in her father; Diabetes in her mother; Hypertension in her father.  ROS:   Please see the history of present illness.  All other systems reviewed and are negative.  EKGs/Labs/Other Studies Reviewed:    The following studies were reviewed today:   EKG:   02/28/21:NSR, rate 67, No ST abnormalities 11/28/2020: Normal Sinus Rhythm. Rate 62 bpm. No ST abnormalities   Recent Labs: 11/28/2020: TSH 1.240 02/28/2021: Magnesium 2.1 05/24/2021: ALT 15; BUN 18; Creatinine 0.86; Potassium 4.5; Sodium 141 08/03/2021: Hemoglobin 13.7; Platelets 192  Recent Lipid Panel    Component Value Date/Time   CHOL 179 09/02/2020 1035   TRIG 142 09/02/2020 1035   HDL 56 09/02/2020 1035   CHOLHDL 3.2 09/02/2020 1035   VLDL 28 01/14/2017 1114   LDLCALC 99 09/02/2020 1035    Physical Exam:    VS:  BP 126/68 (BP Location: Left Arm, Patient Position: Sitting, Cuff Size: Normal)    Pulse 77    Ht 5\' 5"  (1.651 m)    Wt 147 lb (66.7 kg)    BMI 24.46 kg/m     Wt Readings from Last 3 Encounters:  09/05/21 147 lb (66.7 kg)  09/04/21 146 lb 12.8 oz (66.6 kg)  08/24/21 147 lb 3.2 oz (66.8 kg)     GEN: Well nourished, well developed in no acute distress HEENT: Normal NECK: No JVD; No carotid bruits LYMPHATICS: No lymphadenopathy CARDIAC: RRR, no murmurs, rubs, gallops RESPIRATORY:  Clear to auscultation without rales, wheezing or rhonchi  ABDOMEN: Soft,  non-tender, non-distended MUSCULOSKELETAL:  No edema; No deformity  SKIN: Warm and dry NEUROLOGIC:  Alert and oriented x 3 PSYCHIATRIC:  Normal affect   ASSESSMENT:    1. Paroxysmal atrial fibrillation (HCC)   2. Chronic diastolic heart failure ([place])   3. Essential hypertension   4. Hyperlipidemia, unspecified hyperlipidemia type   5. Hypokalemia      PLAN:    Atrial fibrillation: Diagnosed during admission with gastroenteritis 10/2020.  CHA2DS2-VASc score 5 (hypertension, age x2, diabetes,  female).  Zio patch x2 weeks on 12/22/2020 showed AF burden 4%, average rate 150, with longest episode lasting 2 hours 46 minutes.  Echocardiogram 12/28/2020 showed normal biventricular function, no significant valvular disease. -Continue diltiazem 360 mg daily -Continue Eliquis 5 mg twice daily  Chronic diastolic heart failure: On Lasix 20 mg daily.  Check BMET/magnesium  Hypertension: Currently on diltiazem 360 mg daily and losartan 50 mg daily.  Appears controlled  Hyperlipidemia: On pravastatin 40 mg daily.  LDL 99 on 09/02/2020  Hypokalemia: Potassium 2.6 on 11/18/2020.  Likely due to vomiting and diarrhea in the setting of Lasix use.  Lasix discontinued but was restarted, potassium has since been normal.  We will recheck BMET as above  T2DM: A1c down to 5.8% in 03/2021.  Currently diet controlled.  RTC in 6 months   Medication Adjustments/Labs and Tests Ordered: Current medicines are reviewed at length with the patient today.  Concerns regarding medicines are outlined above.  Orders Placed This Encounter  Procedures   Basic Metabolic Panel (BMET)   Magnesium    No orders of the defined types were placed in this encounter.    Patient Instructions   Follow-Up: At [facility], you and your health needs are our priority.  As part of our continuing mission to provide you with exceptional heart care, we have created designated Provider Care Teams.  These Care Teams include your primary  Cardiologist (physician) and Advanced Practice Providers (APPs -  Physician Assistants and Nurse Practitioners) who all work together to provide you with the care you need, when you need it.  We recommend signing up for the patient portal called "MyChart".  Sign up information is provided on this After Visit Summary.  MyChart is used to connect with patients for Virtual Visits (Telemedicine).  Patients are able to view lab/test results, encounter notes, upcoming appointments, etc.  Non-urgent messages can be sent to your provider as well.   To learn more about what you can do with MyChart, go to [email or web address].    Your next appointment:   6 month(s)  The format for your next appointment:   In Person  Provider:   Oswaldo Milian MD        Signed, Donato Heinz, MD  09/05/2021 3:20 PM    [place]

## 2021-09-04 ENCOUNTER — Ambulatory Visit: Payer: Medicare Other | Attending: Internal Medicine | Admitting: Internal Medicine

## 2021-09-04 ENCOUNTER — Telehealth: Payer: Self-pay | Admitting: Pharmacist

## 2021-09-04 ENCOUNTER — Other Ambulatory Visit: Payer: Self-pay

## 2021-09-04 ENCOUNTER — Encounter: Payer: Self-pay | Admitting: Internal Medicine

## 2021-09-04 VITALS — BP 130/79 | HR 65 | Resp 16 | Wt 146.8 lb

## 2021-09-04 DIAGNOSIS — M545 Low back pain, unspecified: Secondary | ICD-10-CM

## 2021-09-04 DIAGNOSIS — E785 Hyperlipidemia, unspecified: Secondary | ICD-10-CM

## 2021-09-04 DIAGNOSIS — Z23 Encounter for immunization: Secondary | ICD-10-CM

## 2021-09-04 DIAGNOSIS — I480 Paroxysmal atrial fibrillation: Secondary | ICD-10-CM | POA: Diagnosis not present

## 2021-09-04 DIAGNOSIS — E1159 Type 2 diabetes mellitus with other circulatory complications: Secondary | ICD-10-CM

## 2021-09-04 DIAGNOSIS — I152 Hypertension secondary to endocrine disorders: Secondary | ICD-10-CM

## 2021-09-04 DIAGNOSIS — E1169 Type 2 diabetes mellitus with other specified complication: Secondary | ICD-10-CM

## 2021-09-04 DIAGNOSIS — D6869 Other thrombophilia: Secondary | ICD-10-CM

## 2021-09-04 DIAGNOSIS — E1140 Type 2 diabetes mellitus with diabetic neuropathy, unspecified: Secondary | ICD-10-CM

## 2021-09-04 DIAGNOSIS — G8929 Other chronic pain: Secondary | ICD-10-CM

## 2021-09-04 MED ORDER — APIXABAN 5 MG PO TABS
5.0000 mg | ORAL_TABLET | Freq: Two times a day (BID) | ORAL | 2 refills | Status: DC
  Filled 2021-09-04: qty 60, 30d supply, fill #0

## 2021-09-04 MED ORDER — ZOSTER VAC RECOMB ADJUVANTED 50 MCG/0.5ML IM SUSR: 0.5000 mL | Freq: Once | INTRAMUSCULAR | 0 refills | Status: AC

## 2021-09-04 NOTE — Telephone Encounter (Signed)
Ruth Delgado,   This patient has Medicare and has not yet met her deductible. Even when her deductible has been met her copay is high. Are we able to have her apply for patient assistance? Dr. Wynetta Emery is sending the rx to [place].

## 2021-09-04 NOTE — Progress Notes (Signed)
Patient ID: Ruth Delgado, female    DOB: 07-Jun-1935  MRN: [id number]  CC: chronic ds management  Subjective: Ruth Delgado is a 87 y.o. female who presents for chronic ds management Her concerns today include:  Pt with hx of HTN, HL, PAF on anticoagulation, DM type 2, GERD, vit D def, DVT RT leg, gout, depression, gout,  DCIS RT breast ER/PR +  "My back is so much better."  Little sore when she does a lot around the house.  Lays on heating pad when that happens which helps.  Saw P.T last wk.  Given some home exercises.  -no falls. Takes Tylenol one tab about every other day. Takes her cane with her when leaving the house.  DCIS:  on Tamoxifin, not wanting surgery.   HTN/PAF:  Range 120/60-70.  On Losartan snf Cardizem Limits salt NO CP/SOB/LE edema/palpitations Some small bruises at time.  No bleeding but bleeds easily when stuck Paying $164 for 30 days of Eliquis.    DM:  diet control.  Checking BS ever other day.  Gives range 100-120s. Little higher over the holidays.   -eat fairly healthy Numbness in toes at times  HL:  taking and tolerating Pravachol  HM:  Due for shingrix  Patient Active Problem List   Diagnosis Date Noted   Ductal carcinoma in situ (DCIS) of right breast 05/19/2021   Paroxysmal atrial fibrillation ([place]) 12/09/2020   Secondary hypercoagulable state ([place]) 12/09/2020   Atrial fibrillation with RVR ([place]) 11/15/2020   Nausea, vomiting and diarrhea 11/15/2020   CKD stage 2 due to type 2 diabetes mellitus ([place]) 03/06/2018   Overweight (BMI 25.0-29.9) 03/06/2018   Osteopenia 03/06/2018   Esophageal reflux 09/06/2015   Vitamin D deficiency 06/08/2013   Medication management 06/08/2013   Hypertension    Hyperlipemia    Gout    Type 2 diabetes mellitus with stage 3 chronic kidney disease, without long-term current use of insulin ([place])      Current Outpatient Medications on File Prior to Visit  Medication Sig Dispense Refill   acetaminophen  (TYLENOL) 650 MG CR tablet Take 650 mg by mouth every 8 (eight) hours as needed for pain.     allopurinol (ZYLOPRIM) 300 MG tablet TAKE 1 TABLET BY MOUTH DAILY TO PREVENT GOUT 90 tablet 3   cholecalciferol (VITAMIN D) 1000 UNITS tablet Take 5,000 Units by mouth every other day.      citalopram (CELEXA) 20 MG tablet Take 1 tablet (20 mg total) by mouth daily. 30 tablet 2   diclofenac Sodium (VOLTAREN) 1 % GEL Apply 2 g topically 4 (four) times daily as needed. (Patient not taking: Reported on 08/30/2021) 100 g 1   diltiazem (CARDIZEM CD) 360 MG 24 hr capsule TAKE 1 CAPSULE (360 MG TOTAL) BY MOUTH DAILY. 30 capsule 7   furosemide (LASIX) 20 MG tablet Take 1 tablet (20 mg total) by mouth daily. 30 tablet 6   Lancets (FREESTYLE) lancets   12   letrozole (FEMARA) 2.5 MG tablet Take 1 tablet (2.5 mg total) by mouth daily. 90 tablet 3   losartan (COZAAR) 50 MG tablet Take 1 tablet (50 mg total) by mouth daily. 90 tablet 2   nystatin-triamcinolone ointment (MYCOLOG) Apply 1 application topically 2 (two) times daily. 30 g 0   pravastatin (PRAVACHOL) 40 MG tablet TAKE 1 TABLET BY MOUTH AT BEDTIME FOR CHOLESTEROL 90 tablet 1   Simethicone (GAS-X PO) Take 2 tablets by mouth daily as needed (bloating).  triamcinolone cream (KENALOG) 0.1 % Apply 1 application topically daily as needed (itching). (Patient not taking: Reported on 08/30/2021)  2   No current facility-administered medications on file prior to visit.    Allergies  Allergen Reactions   Buspar [Buspirone]     dysphoria   Lipitor [Atorvastatin]     Myalgias   Metformin And Related     Gas/bloating   Nsaids     GI upset   Sulfa Antibiotics Other (See Comments)    Reaction=throat itching   Sulfonamide Derivatives     Social History   Socioeconomic History   Marital status: Widowed    Spouse name: Not on file   Number of children: Not on file   Years of education: Not on file   Highest education level: Not on file  Occupational  History   Occupation: retired  Tobacco Use   Smoking status: Former    Packs/day: 0.50    Years: 25.00    Pack years: 12.50    Types: Cigarettes    Quit date: 07/30/2006    Years since quitting: 15.1   Smokeless tobacco: Never   Tobacco comments:    Former smoker 07/04/2021  Substance and Sexual Activity   Alcohol use: Not Currently    Alcohol/week: 2.0 standard drinks    Types: 2 Standard drinks or equivalent per week    Comment: occasional, none in 3 months   Drug use: Never   Sexual activity: Not on file  Other Topics Concern   Not on file  Social History Narrative   Not on file   Social Determinants of Health   Financial Resource Strain: Low Risk    Difficulty of Paying Living Expenses: Not very hard  Food Insecurity: No Food Insecurity   Worried About Charity fundraiser in the Last Year: Never true   [place] in the Last Year: Never true  Transportation Needs: No Transportation Needs   Lack of Transportation (Medical): No   Lack of Transportation (Non-Medical): No  Physical Activity: Not on file  Stress: Not on file  Social Connections: Not on file  Intimate Partner Violence: Not on file    Family History  Problem Relation Age of Onset   Diabetes Mother        suicide at age 17   Hypertension Father    CVA Father     Past Surgical History:  Procedure Laterality Date   ABDOMINAL HYSTERECTOMY     BIOPSY BREAST     (L) BREAST IN 1993   KNEE ARTHROSCOPY      ROS: Review of Systems Negative except as stated above  PHYSICAL EXAM: BP 130/79    Pulse 65    Resp 16    Wt 146 lb 12.8 oz (66.6 kg)    SpO2 96%    BMI 24.43 kg/m   Physical Exam  General appearance - alert, well appearing, pleasant elderly Caucasian female and in no distress.  She has a cane with her.  She ambulates to the exam table and gets onto the exam table without assistance or use of her cane. Mental status - normal mood, behavior, speech, dress, motor activity, and thought  processes Neck - supple, no significant adenopathy Chest - clear to auscultation, no wheezes, rales or rhonchi, symmetric air entry Heart - heart sounds are regular Extremities -no lower extremity edema.  She is wearing compression socks.   CMP Latest Ref Rng & Units 05/24/2021 02/28/2021 12/21/2020  Glucose 70 -  99 mg/dL 118(H) 107(H) 103(H)  BUN 8 - 23 mg/dL 18 17 13   Creatinine 0.44 - 1.00 mg/dL 0.86 0.84 0.81  Sodium 135 - 145 mmol/L 141 143 141  Potassium 3.5 - 5.1 mmol/L 4.5 4.2 4.2  Chloride 98 - 111 mmol/L 102 100 99  CO2 22 - 32 mmol/L 27 25 27   Calcium 8.9 - 10.3 mg/dL 9.5 9.3 9.2  Total Protein 6.5 - 8.1 g/dL 7.3 - -  Total Bilirubin 0.3 - 1.2 mg/dL 0.6 - -  Alkaline Phos 38 - 126 U/L 54 - -  AST 15 - 41 U/L 20 - -  ALT 0 - 44 U/L 15 - -   Lipid Panel     Component Value Date/Time   CHOL 179 09/02/2020 1035   TRIG 142 09/02/2020 1035   HDL 56 09/02/2020 1035   CHOLHDL 3.2 09/02/2020 1035   VLDL 28 01/14/2017 1114   LDLCALC 99 09/02/2020 1035    CBC    Component Value Date/Time   WBC 6.8 08/03/2021 1047   WBC 6.9 05/24/2021 0808   WBC 7.4 12/01/2020 1615   RBC 4.20 08/03/2021 1047   RBC 4.06 05/24/2021 0808   HGB 13.7 08/03/2021 1047   HCT 40.8 08/03/2021 1047   PLT 192 08/03/2021 1047   MCV 97 08/03/2021 1047   MCH 32.6 08/03/2021 1047   MCH 33.0 05/24/2021 0808   MCHC 33.6 08/03/2021 1047   MCHC 33.8 05/24/2021 0808   RDW 13.0 08/03/2021 1047   LYMPHSABS 2.4 05/24/2021 0808   MONOABS 0.5 05/24/2021 0808   EOSABS 0.1 05/24/2021 0808   BASOSABS 0.1 05/24/2021 0808    ASSESSMENT AND PLAN:  1. Hypertension associated with diabetes ([place]) At goal.  Continue carvedilol and Cozaar.  2. Controlled type 2 diabetes with neuropathy ([place]) Reported blood sugar readings are at goal.  Continue healthy eating habits. - Microalbumin / creatinine urine ratio  3. PAF (paroxysmal atrial fibrillation) ([place]) I spoke with our clinical pharmacist to see whether she  would qualify to get Eliquis through a patient assistance program.  They will run it and see whether she qualifies.  If she does, it would be much more manageable for her in terms of cost. Advised to report any excessive bruising or if she has any bleeding including blood in the urine or stools. - apixaban (ELIQUIS) 5 MG TABS tablet; Take 1 tablet (5 mg total) by mouth 2 (two) times daily.  Dispense: 60 tablet; Refill: 2  4. Secondary hypercoagulable state ([place]) See #3 above  5. Hyperlipidemia associated with type 2 diabetes mellitus (HCC) Continue Pravachol.  6. Chronic left-sided low back pain without sciatica Patient continues to improve.  7. Need for shingles vaccine -I have given her prescription to get the Shingrix vaccine at her pharmacy.  Advised that she may want to check with her insurance first to make sure that it is covered. - Zoster Vaccine Adjuvanted [facility]) injection; Inject 0.5 mLs into the muscle once for 1 dose.  Dispense: 0.5 mL; Refill: 0   Patient was given the opportunity to ask questions.  Patient verbalized understanding of the plan and was able to repeat key elements of the plan.   Orders Placed This Encounter  Procedures   Microalbumin / creatinine urine ratio     Requested Prescriptions   Signed Prescriptions Disp Refills   Zoster Vaccine Adjuvanted [facility]) injection 0.5 mL 0    Sig: Inject 0.5 mLs into the muscle once for 1 dose.  apixaban (ELIQUIS) 5 MG TABS tablet 60 tablet 2    Sig: Take 1 tablet (5 mg total) by mouth 2 (two) times daily.    Return in about 4 months (around 01/02/2022).  Karle Plumber, MD, FACP

## 2021-09-05 ENCOUNTER — Telehealth: Payer: Self-pay

## 2021-09-05 ENCOUNTER — Encounter: Payer: Self-pay | Admitting: Cardiology

## 2021-09-05 ENCOUNTER — Other Ambulatory Visit: Payer: Self-pay

## 2021-09-05 ENCOUNTER — Ambulatory Visit: Payer: Medicare Other | Admitting: Cardiology

## 2021-09-05 VITALS — BP 126/68 | HR 77 | Ht 65.0 in | Wt 147.0 lb

## 2021-09-05 DIAGNOSIS — E876 Hypokalemia: Secondary | ICD-10-CM

## 2021-09-05 DIAGNOSIS — I10 Essential (primary) hypertension: Secondary | ICD-10-CM | POA: Diagnosis not present

## 2021-09-05 DIAGNOSIS — E785 Hyperlipidemia, unspecified: Secondary | ICD-10-CM | POA: Diagnosis not present

## 2021-09-05 DIAGNOSIS — I480 Paroxysmal atrial fibrillation: Secondary | ICD-10-CM | POA: Diagnosis not present

## 2021-09-05 DIAGNOSIS — I5032 Chronic diastolic (congestive) heart failure: Secondary | ICD-10-CM

## 2021-09-05 LAB — MICROALBUMIN / CREATININE URINE RATIO
Creatinine, Urine: 43.7 mg/dL
Microalb/Creat Ratio: <7 mg/g creat (ref 0–29)
Microalbumin, Urine: <3 ug/mL

## 2021-09-05 NOTE — Patient Instructions (Signed)
°  Follow-Up: °At [facility], you and your health needs are our priority.  As part of our continuing mission to provide you with exceptional heart care, we have created designated Provider Care Teams.  These Care Teams include your primary Cardiologist (physician) and Advanced Practice Providers (APPs -  Physician Assistants and Nurse Practitioners) who all work together to provide you with the care you need, when you need it. ° °We recommend signing up for the patient portal called "MyChart".  Sign up information is provided on this After Visit Summary.  MyChart is used to connect with patients for Virtual Visits (Telemedicine).  Patients are able to view lab/test results, encounter notes, upcoming appointments, etc.  Non-urgent messages can be sent to your provider as well.   °To learn more about what you can do with MyChart, go to [email or web address].   ° °Your next appointment:   °6 month(s) ° °The format for your next appointment:   °In Person ° °Provider:   °CHRISTOPHER SCHUMANN MD  ° ° °

## 2021-09-05 NOTE — Telephone Encounter (Signed)
Understood. Dr. Wynetta Emery sent a rx over for Eliquis. Can we start this process for her?

## 2021-09-05 NOTE — Telephone Encounter (Signed)
When speaking to the patient regarding patient assistance for Eliquis patient requested that the request for assistance be canceled because she doesn't qualify due to income being too high.  I advised that as long as she is under 300% of the [place] she may still qualify.  Patient states she is above this amount and does not need assistance at this time.

## 2021-09-05 NOTE — Telephone Encounter (Signed)
Patient assistance is available.  She would have to provide proof of income and documentation that she has spent 3% of her income out-of-pocket on medication from January 1st through the date of application submission for an approval.

## 2021-09-06 ENCOUNTER — Telehealth: Payer: Self-pay

## 2021-09-06 NOTE — Telephone Encounter (Signed)
Contacted pt to go over lab results pt didn't answer lvm   Sent a CRM and forward labs to NT to give pt labs when they call back   

## 2021-09-07 ENCOUNTER — Encounter: Payer: Self-pay | Admitting: *Deleted

## 2021-09-07 LAB — BASIC METABOLIC PANEL
BUN/Creatinine Ratio: 24 (ref 12–28)
BUN: 21 mg/dL (ref 8–27)
CO2: 22 mmol/L (ref 20–29)
Calcium: 9.5 mg/dL (ref 8.7–10.3)
Chloride: 103 mmol/L (ref 96–106)
Creatinine, Ser: 0.86 mg/dL (ref 0.57–1.00)
Glucose: 98 mg/dL (ref 70–99)
Potassium: 4.4 mmol/L (ref 3.5–5.2)
Sodium: 144 mmol/L (ref 134–144)
eGFR: 67 mL/min/{1.73_m2} (ref >59)

## 2021-09-07 LAB — MAGNESIUM: Magnesium: 2.3 mg/dL (ref 1.6–2.3)

## 2021-09-14 ENCOUNTER — Other Ambulatory Visit: Payer: Self-pay | Admitting: Internal Medicine

## 2021-09-14 DIAGNOSIS — I480 Paroxysmal atrial fibrillation: Secondary | ICD-10-CM

## 2021-09-14 NOTE — Telephone Encounter (Signed)
Requested medication (s) are due for refill today: No, filled at different pharm 09/04/20  Requested medication (s) are on the active medication list: yes  Last refill:  09/04/20 #60 with 2 RF at [place] at [facility]  Future visit scheduled: 01/02/22  Notes to clinic:  Filled 2/6 at different pharm than one requesting. Please assess.   Requested Prescriptions  Pending Prescriptions Disp Refills   ELIQUIS 5 MG TABS tablet [Pharmacy Med Name: ELIQUIS 5 MG TABLET 5 Tablet] 60 tablet 0    Sig: TAKE 1 TABLET BY MOUTH 2 TIMES DAILY.     Hematology:  Anticoagulants - apixaban Passed - 09/14/2021 11:16 AM      Passed - PLT in normal range and within 360 days    Platelets  Date Value Ref Range Status  08/03/2021 192 150 - 450 x10E3/uL Final          Passed - HGB in normal range and within 360 days    Hemoglobin  Date Value Ref Range Status  08/03/2021 13.7 11.1 - 15.9 g/dL Final          Passed - HCT in normal range and within 360 days    Hematocrit  Date Value Ref Range Status  08/03/2021 40.8 34.0 - 46.6 % Final          Passed - Cr in normal range and within 360 days    Creatinine  Date Value Ref Range Status  05/24/2021 0.86 0.44 - 1.00 mg/dL Final   Creat  Date Value Ref Range Status  12/01/2020 0.80 0.60 - 0.88 mg/dL Final    Comment:    For patients >67 years of age, the reference limit for Creatinine is approximately 13% higher for people identified as African-American. .    Creatinine, Ser  Date Value Ref Range Status  09/05/2021 0.86 0.57 - 1.00 mg/dL Final   Creatinine, Urine  Date Value Ref Range Status  02/11/2020 104 20 - 275 mg/dL Final          Passed - AST in normal range and within 360 days    AST  Date Value Ref Range Status  05/24/2021 20 15 - 41 U/L Final          Passed - ALT in normal range and within 360 days    ALT  Date Value Ref Range Status  05/24/2021 15 0 - 44 U/L Final          Passed - Valid encounter within last  12 months    Recent Outpatient Visits           1 week ago Hypertension associated with diabetes [facility])   [place] Karle Plumber B, MD   1 month ago Acute left-sided low back pain without sciatica   [place], MD   5 months ago Controlled type 2 diabetes with neuropathy [facility])   [place], MD   8 months ago Establishing care with new doctor, encounter for   [place], MD       Future Appointments             In 3 months Wynetta Emery, Dalbert Batman, MD [place]

## 2021-10-02 ENCOUNTER — Telehealth: Payer: Self-pay | Admitting: Internal Medicine

## 2021-10-02 NOTE — Telephone Encounter (Signed)
Pt was called and a VM was left informing patient to return phone call. ? ? ?CRM created for patient. ?

## 2021-10-02 NOTE — Telephone Encounter (Signed)
Copied from [place] [id number]. Topic: General - Other >> Sep 29, 2021  4:21 PM Yvette Rack wrote: Reason for CRM: Pt stated she had a message from [place] asking her to call the office.

## 2021-10-03 ENCOUNTER — Other Ambulatory Visit: Payer: Self-pay | Admitting: Internal Medicine

## 2021-10-04 ENCOUNTER — Other Ambulatory Visit: Payer: Self-pay | Admitting: Internal Medicine

## 2021-10-04 NOTE — Telephone Encounter (Signed)
Medication Refill - Medication: citalopram (CELEXA) 20 MG tablet ? ?Has the patient contacted their pharmacy? Yes.   ?(Agent: If no, request that the patient contact the pharmacy for the refill. If patient does not wish to contact the pharmacy document the reason why and proceed with request.) ?(Agent: If yes, when and what did the pharmacy advise?)pharmacy stated they havent heard back from provider  ? ?Preferred Pharmacy (with phone number or street name): [facility], [place]  ?[place], [place] [address]  ?Phone:  [phone]  Fax:  [phone]  ?Has the patient been seen for an appointment in the last year OR does the patient have an upcoming appointment? Yes.   ? ?Agent: Please be advised that RX refills may take up to 3 business days. We ask that you follow-up with your pharmacy. ?

## 2021-10-05 MED ORDER — CITALOPRAM HYDROBROMIDE 20 MG PO TABS: 20.0000 mg | ORAL_TABLET | Freq: Every day | ORAL | 0 refills | Status: DC

## 2021-10-05 NOTE — Telephone Encounter (Signed)
Requested Prescriptions  ?Pending Prescriptions Disp Refills  ?? citalopram (CELEXA) 20 MG tablet 30 tablet 0  ?  Sig: Take 1 tablet (20 mg total) by mouth daily.  ?  ? Psychiatry:  Antidepressants - SSRI Passed - 10/04/2021  4:48 PM  ?  ?  Passed - Valid encounter within last 6 months  ?  Recent Outpatient Visits   ?      ? 1 month ago Hypertension associated with diabetes ([place])  ? [place] Karle Plumber B, MD  ? 2 months ago Acute left-sided low back pain without sciatica  ? [place] Karle Plumber B, MD  ? 5 months ago Controlled type 2 diabetes with neuropathy [facility])  ? [place] Ladell Pier, MD  ? 9 months ago Establishing care with new doctor, encounter for  ? [place] Ladell Pier, MD  ?  ?  ?Future Appointments   ?        ? In 2 months Ladell Pier, MD [place]  ? In 5 months Donato Heinz, MD [facility]  ?  ? ?  ?  ?  ? ? ?

## 2021-10-11 ENCOUNTER — Other Ambulatory Visit (HOSPITAL_COMMUNITY): Payer: Self-pay | Admitting: Physician Assistant

## 2021-10-27 ENCOUNTER — Other Ambulatory Visit: Payer: Self-pay | Admitting: Physician Assistant

## 2021-11-08 ENCOUNTER — Other Ambulatory Visit: Payer: Self-pay | Admitting: Internal Medicine

## 2021-11-08 NOTE — Progress Notes (Signed)
? ?Patient Care Team: ?Ladell Pier, MD as PCP - General (Internal Medicine) ?Donato Heinz, MD as PCP - Cardiology (Cardiology) ?Jacelyn Pi, MD as Consulting Physician (Endocrinology) ?Rockwell Germany, RN as Oncology Nurse Navigator ?Mauro Kaufmann, RN as Oncology Nurse Navigator ?Donnie Mesa, MD as Consulting Physician (General Surgery) ?Nicholas Lose, MD as Consulting Physician (Hematology and Oncology) ?Eppie Gibson, MD as Attending Physician (Radiation Oncology) ? ?DIAGNOSIS:  ?Encounter Diagnosis  ?Name Primary?  ? Ductal carcinoma in situ (DCIS) of right breast Yes  ? ? ?SUMMARY OF ONCOLOGIC HISTORY: ?Oncology History  ?Ductal carcinoma in situ (DCIS) of right breast  ?05/19/2021 Initial Diagnosis  ? Screening mammogram: indeterminate linear calcifications in the right breast. Diagnostic mammogram and [place]: 1 cm group of fine linear branching calcifications.  Stereotactic biopsy: Low-grade DCIS, ER 100%, PR 95% ?  ?05/24/2021 Cancer Staging  ? Staging form: Breast, AJCC 8th Edition ?- Clinical stage from 05/24/2021: Stage 0 (cTis (DCIS), cN0, cM0, G1, ER+, PR+, HER2: Not Assessed) - Signed by Nicholas Lose, MD on 05/24/2021 ?Histologic grading system: 3 grade system ? ?  ? ? ?CHIEF COMPLIANT: Follow-up after mammogram ? ?INTERVAL HISTORY: Ruth Delgado is a 87 y.o. with above-mentioned history of right breast DCIS. She presents to the clinic for follow-up and discuss mammogram. She complained of numbness in toes and in her ankles on both sides. She states that its better now. She states when she was in the hospital last year and she states that she had shingles, she took shot 2 months ago but last week she starts itching again and turning red that she was concern about. She states it about gone now. She tolerating the Letrozole. ? ? ?ALLERGIES:  is allergic to buspar [buspirone], lipitor [atorvastatin], metformin and related, nsaids, sulfa antibiotics, and sulfonamide  derivatives. ? ?MEDICATIONS:  ?Current Outpatient Medications  ?Medication Sig Dispense Refill  ? acetaminophen (TYLENOL) 650 MG CR tablet Take 650 mg by mouth every 8 (eight) hours as needed for pain.    ? allopurinol (ZYLOPRIM) 300 MG tablet TAKE 1 TABLET BY MOUTH DAILY TO PREVENT GOUT 90 tablet 3  ? apixaban (ELIQUIS) 5 MG TABS tablet TAKE 1 TABLET BY MOUTH 2 TIMES DAILY. 60 tablet 3  ? cholecalciferol (VITAMIN D) 1000 UNITS tablet Take 5,000 Units by mouth every other day.     ? citalopram (CELEXA) 20 MG tablet TAKE 1 TABLET (20 MG TOTAL) BY MOUTH DAILY. 90 tablet 0  ? diclofenac Sodium (VOLTAREN) 1 % GEL Apply 2 g topically 4 (four) times daily as needed. 100 g 1  ? diltiazem (CARDIZEM CD) 360 MG 24 hr capsule TAKE 1 CAPSULE (360 MG TOTAL) BY MOUTH DAILY. 30 capsule 7  ? furosemide (LASIX) 20 MG tablet TAKE 1 TABLET (20 MG TOTAL) BY MOUTH DAILY. 30 tablet 6  ? Lancets (FREESTYLE) lancets   12  ? letrozole (FEMARA) 2.5 MG tablet Take 1 tablet (2.5 mg total) by mouth daily. 90 tablet 3  ? losartan (COZAAR) 50 MG tablet TAKE 1 TABLET (50 MG TOTAL) BY MOUTH DAILY. 90 tablet 2  ? nystatin-triamcinolone ointment (MYCOLOG) Apply 1 application topically 2 (two) times daily. 30 g 0  ? pravastatin (PRAVACHOL) 40 MG tablet TAKE 1 TABLET BY MOUTH AT BEDTIME FOR CHOLESTEROL 90 tablet 1  ? Simethicone (GAS-X PO) Take 2 tablets by mouth daily as needed (bloating).    ? ?No current facility-administered medications for this visit.  ? ? ?PHYSICAL EXAMINATION: ?ECOG PERFORMANCE STATUS: 1 -  Symptomatic but completely ambulatory ? ?Vitals:  ? 11/22/21 1111  ?BP: 122/67  ?Pulse: 75  ?Resp: 18  ?Temp: (!) 97.5 ?F (36.4 ?C)  ?SpO2: 100%  ? ?Filed Weights  ? 11/22/21 1111  ?Weight: 147 lb 8 oz (66.9 kg)  ? ? ?BREAST: No palpable masses or nodules in either right or left breasts. No palpable axillary supraclavicular or infraclavicular adenopathy no breast tenderness or nipple discharge. (exam performed in the presence of a  chaperone) ? ?LABORATORY DATA:  ?I have reviewed the data as listed ? ?  Latest Ref Rng & Units 09/05/2021  ?  3:31 PM 05/24/2021  ?  8:08 AM 02/28/2021  ?  9:41 AM  ?CMP  ?Glucose 70 - 99 mg/dL 98   118   107    ?BUN 8 - 27 mg/dL _0 ?Creatinine 0.57 - 1.00 mg/dL 0.86   0.86   0.84    ?Sodium 134 - 144 mmol/L 144   141   143    ?Potassium 3.5 - 5.2 mmol/L 4.4   4.5   4.2    ?Chloride 96 - 106 mmol/L 103   102   100    ?CO2 20 - 29 mmol/L _1 ?Calcium 8.7 - 10.3 mg/dL 9.5   9.5   9.3    ?Total Protein 6.5 - 8.1 g/dL  7.3     ?Total Bilirubin 0.3 - 1.2 mg/dL  0.6     ?Alkaline Phos 38 - 126 U/L  54     ?AST 15 - 41 U/L  20     ?ALT 0 - 44 U/L  15     ? ? ?Lab Results  ?Component Value Date  ? WBC 6.8 08/03/2021  ? HGB 13.7 08/03/2021  ? HCT 40.8 08/03/2021  ? MCV 97 08/03/2021  ? PLT 192 08/03/2021  ? NEUTROABS 3.9 05/24/2021  ? ? ?ASSESSMENT & PLAN:  ?Ductal carcinoma in situ (DCIS) of right breast ?October 2022:Screening mammogram: indeterminate linear calcifications in the right breast. Diagnostic mammogram and [place]: 1 cm group of fine linear branching calcifications.  Stereotactic biopsy: Low-grade DCIS, ER 100%, PR 95% ?  ?Recommendation: ?1.  Neoadjuvant antiestrogen therapy with tamoxifen ?2.  patient did not wish to undergo surgery ?--------------------------------------------------------------------------------------------------------------------------------- ?Tamoxifen toxicities: ?Sweating every morning: Resolved with taking tamoxifen at bedtime. ?No other side effects to tamoxifen. ?Intermittent breast twinges: Benign ?  ?11/02/2021: Right mammogram: Stable biopsy site low-grade DCIS without residual calcifications. ?  ?Plan: Bilateral mammogram in 6 months and follow-up after that. ?  ? ? ? ?Orders Placed This Encounter  ?Procedures  ? MM DIAG BREAST TOMO BILATERAL  ?  Standing Status:   Future  ?  Standing Expiration Date:   11/23/2022  ?  Order Specific Question:   Reason for Exam  (SYMPTOM  OR DIAGNOSIS REQUIRED)  ?  Answer:   annual mammaogram dcis right breast  ?  Order Specific Question:   Preferred imaging location?  ?  Answer:   External  ?  Comments:   [place]  ? ?The patient has a good understanding of the overall plan. she agrees with it. she will call with any problems that may develop before the next visit here. ?Total time spent: 30 mins including face to face time and time spent for planning, charting and co-ordination of care ? ? Harriette Ohara, MD ?11/22/21 ? ? ? I Deritra,  [place] am scribing for Dr. Lindi Adie ? ?I have reviewed the above documentation for accuracy and completeness, and I agree with the above. ?  ?

## 2021-11-17 ENCOUNTER — Encounter: Payer: Self-pay | Admitting: Hematology and Oncology

## 2021-11-22 ENCOUNTER — Inpatient Hospital Stay: Payer: Medicare Other | Attending: Hematology and Oncology | Admitting: Hematology and Oncology

## 2021-11-22 ENCOUNTER — Other Ambulatory Visit: Payer: Self-pay

## 2021-11-22 VITALS — BP 122/67 | HR 75 | Temp 97.5°F | Resp 18 | Ht 65.0 in | Wt 147.5 lb

## 2021-11-22 DIAGNOSIS — Z79810 Long term (current) use of selective estrogen receptor modulators (SERMs): Secondary | ICD-10-CM | POA: Diagnosis not present

## 2021-11-22 DIAGNOSIS — D0511 Intraductal carcinoma in situ of right breast: Secondary | ICD-10-CM

## 2021-11-22 NOTE — Assessment & Plan Note (Addendum)
October 2022:Screening mammogram: indeterminate linear calcifications in the right breast. Diagnostic mammogram and [place]: 1 cm group of fine linear branching calcifications. ?Stereotactic biopsy: Low-grade DCIS, ER 100%, PR 95% ?? ?Recommendation: ?1.  Neoadjuvant antiestrogen therapy with tamoxifen ?2.? patient did not wish to undergo surgery ?--------------------------------------------------------------------------------------------------------------------------------- ?Tamoxifen toxicities: ?1. Sweating every morning: Resolved with taking tamoxifen at bedtime. ?No other side effects to tamoxifen. ?Intermittent breast twinges: Benign ?? ?11/02/2021: Right mammogram: Stable biopsy site low-grade DCIS without residual calcifications. ?? ?Plan: Bilateral mammogram in 6 months and follow-up after that. ?? ?

## 2021-12-15 ENCOUNTER — Other Ambulatory Visit: Payer: Self-pay | Admitting: Internal Medicine

## 2021-12-15 DIAGNOSIS — E1169 Type 2 diabetes mellitus with other specified complication: Secondary | ICD-10-CM

## 2022-01-02 ENCOUNTER — Ambulatory Visit (HOSPITAL_COMMUNITY): Payer: Medicare Other | Admitting: Physician Assistant

## 2022-01-02 ENCOUNTER — Ambulatory Visit: Payer: Medicare Other | Attending: Internal Medicine | Admitting: Internal Medicine

## 2022-01-02 ENCOUNTER — Encounter: Payer: Self-pay | Admitting: Internal Medicine

## 2022-01-02 VITALS — BP 140/68 | HR 71 | Temp 98.5°F | Resp 16 | Wt 148.8 lb

## 2022-01-02 DIAGNOSIS — I480 Paroxysmal atrial fibrillation: Secondary | ICD-10-CM

## 2022-01-02 DIAGNOSIS — Z8619 Personal history of other infectious and parasitic diseases: Secondary | ICD-10-CM | POA: Diagnosis not present

## 2022-01-02 DIAGNOSIS — E1159 Type 2 diabetes mellitus with other circulatory complications: Secondary | ICD-10-CM | POA: Diagnosis not present

## 2022-01-02 DIAGNOSIS — I152 Hypertension secondary to endocrine disorders: Secondary | ICD-10-CM

## 2022-01-02 DIAGNOSIS — E1165 Type 2 diabetes mellitus with hyperglycemia: Secondary | ICD-10-CM | POA: Insufficient documentation

## 2022-01-02 DIAGNOSIS — E1140 Type 2 diabetes mellitus with diabetic neuropathy, unspecified: Secondary | ICD-10-CM

## 2022-01-02 DIAGNOSIS — E7800 Pure hypercholesterolemia, unspecified: Secondary | ICD-10-CM | POA: Insufficient documentation

## 2022-01-02 LAB — POCT GLYCOSYLATED HEMOGLOBIN (HGB A1C): Hemoglobin A1C: 6.1 % — AB (ref 4.0–5.6)

## 2022-01-02 NOTE — Progress Notes (Signed)
Patient ID: Ruth Delgado, female    DOB: 11-10-1934  MRN: [id number]  CC: Chronic disease management.  Subjective: Ruth Delgado is a 87 y.o. female who presents for chronic disease management Her concerns today include:  Pt with hx of HTN, HL, PAF on anticoagulation, chronic diastolic CHF, DM type 2, GERD, vit D def, DVT RT leg, gout, depression, gout,  DCIS RT breast ER/PR +  Since last visit with me, she has seen her oncologist Dr. Lindi Adie for follow-up on history of breast CA.  She continues on Femara.  Had MMG 10/2021, came back okay.  Not interested in surgery.  HTN/PAF/chronic diastolic CHF: Saw her cardiologist Dr. Gardiner Rhyme 09/05/2021. No changes made in medications.  She continues on losartan 50 mg, Cardizem 360 mg, Furosemide 20 mg. Checks blood pressure a few times a week.  She has some readings with her.  Some of these readings are 132/82, 115/63, 148/80, 127/72. No chest pains, shortness of breath or lower extremity edema.  No palpitations. Some small bruising on arms with EliquisEliquis.  Bleeds easy.  No falls  Reports having had Shingrix vaccines x 2 this yr.  A wk after each shot she got small outbreak shingles.  First time under LT breast and second time over RT shoulder.  Each time lasted 3-4 days with burning and itchy. No pain  DM: Results for orders placed or performed in visit on 01/02/22  POCT glycosylated hemoglobin (Hb A1C)  Result Value Ref Range   Hemoglobin A1C 6.1 (A) 4.0 - 5.6 %   HbA1c POC (<> result, manual entry)     HbA1c, POC (prediabetic range)     HbA1c, POC (controlled diabetic range)    Diet controlled.  She checks her blood sugars but not every day.  She has some readings with her.  The highest was 137.   Does well with her eating habits.  She walks her dog daily.  6 days ago, woke up early morning with pain in RT knee; sticking pain She got up and  placed heating pad on it.  Went away. Next morning she woke with pain in LT shoulder and  arm.  Used heating pad again and it went away.  It was rainy and damp that day.   No more pain since then.  However she states it is not unusual for her to develop pain in 1 or 2 joints and rainy cloudy with.  HM: had shingles vaccine x 2  Patient Active Problem List   Diagnosis Date Noted   Hyperglycemia due to type 2 diabetes mellitus ([place]) 01/02/2022   Hypercholesterolemia 01/02/2022   History of shingles 01/02/2022   Essential hypertension 05/24/2021   Intraductal carcinoma in situ of right breast 05/23/2021   Ductal carcinoma in situ (DCIS) of right breast 05/19/2021   Paroxysmal atrial fibrillation ([place]) 12/09/2020   Secondary hypercoagulable state ([place]) 12/09/2020   Atrial fibrillation with RVR ([place]) 11/15/2020   Nausea, vomiting and diarrhea 11/15/2020   Chronic atrial fibrillation ([place]) 11/15/2020   CKD stage 2 due to type 2 diabetes mellitus ([place]) 03/06/2018   Overweight (BMI 25.0-29.9) 03/06/2018   Osteopenia 03/06/2018   Esophageal reflux 09/06/2015   Vitamin D deficiency 06/08/2013   Medication management 06/08/2013   Hypertension    Hyperlipemia    Gout    Type 2 diabetes mellitus with stage 3 chronic kidney disease, without long-term current use of insulin ([place])      Current Outpatient Medications on File Prior to  Visit  Medication Sig Dispense Refill   acetaminophen (TYLENOL) 650 MG CR tablet Take 650 mg by mouth every 8 (eight) hours as needed for pain.     allopurinol (ZYLOPRIM) 300 MG tablet TAKE 1 TABLET BY MOUTH DAILY TO PREVENT GOUT 90 tablet 3   cholecalciferol (VITAMIN D) 1000 UNITS tablet Take 5,000 Units by mouth every other day.      citalopram (CELEXA) 20 MG tablet TAKE 1 TABLET (20 MG TOTAL) BY MOUTH DAILY. 90 tablet 0   diltiazem (CARDIZEM CD) 360 MG 24 hr capsule TAKE 1 CAPSULE (360 MG TOTAL) BY MOUTH DAILY. 30 capsule 7   furosemide (LASIX) 20 MG tablet TAKE 1 TABLET (20 MG TOTAL) BY MOUTH DAILY. 30 tablet 6   Lancets (FREESTYLE) lancets   12    letrozole (FEMARA) 2.5 MG tablet Take 1 tablet (2.5 mg total) by mouth daily. 90 tablet 3   losartan (COZAAR) 50 MG tablet TAKE 1 TABLET (50 MG TOTAL) BY MOUTH DAILY. 90 tablet 2   pravastatin (PRAVACHOL) 40 MG tablet TAKE 1 TABLET BY MOUTH AT BEDTIME FOR CHOLESTEROL 90 tablet 0   Simethicone (GAS-X PO) Take 2 tablets by mouth daily as needed (bloating).     apixaban (ELIQUIS) 5 MG TABS tablet 1 tablet     No current facility-administered medications on file prior to visit.    Allergies  Allergen Reactions   Buspar [Buspirone]     dysphoria   Lipitor [Atorvastatin]     Myalgias   Metformin And Related     Gas/bloating   Metformin Hcl Other (See Comments)   Nsaids     GI upset   Sulfa Antibiotics Other (See Comments)    Reaction=throat itching   Sulfacetamide Sodium-Sulfur Other (See Comments)   Sulfonamide Derivatives     Social History   Socioeconomic History   Marital status: Widowed    Spouse name: Not on file   Number of children: Not on file   Years of education: Not on file   Highest education level: Not on file  Occupational History   Occupation: retired  Tobacco Use   Smoking status: Former    Packs/day: 0.50    Years: 25.00    Pack years: 12.50    Types: Cigarettes    Quit date: 07/30/2006    Years since quitting: 15.4   Smokeless tobacco: Never   Tobacco comments:    Former smoker 07/04/2021  Substance and Sexual Activity   Alcohol use: Not Currently    Alcohol/week: 2.0 standard drinks    Types: 2 Standard drinks or equivalent per week    Comment: occasional, none in 3 months   Drug use: Never   Sexual activity: Not on file  Other Topics Concern   Not on file  Social History Narrative   Not on file   Social Determinants of Health   Financial Resource Strain: Low Risk    Difficulty of Paying Living Expenses: Not very hard  Food Insecurity: No Food Insecurity   Worried About Charity fundraiser in the Last Year: Never true   [place] in  the Last Year: Never true  Transportation Needs: No Transportation Needs   Lack of Transportation (Medical): No   Lack of Transportation (Non-Medical): No  Physical Activity: Not on file  Stress: Not on file  Social Connections: Not on file  Intimate Partner Violence: Not on file    Family History  Problem Relation Age of Onset   Diabetes  Mother        suicide at age 8   Hypertension Father    CVA Father     Past Surgical History:  Procedure Laterality Date   ABDOMINAL HYSTERECTOMY     BIOPSY BREAST     (L) BREAST IN 1993   KNEE ARTHROSCOPY      ROS: Review of Systems Negative except as stated above  PHYSICAL EXAM: BP 140/68   Pulse 71   Temp 98.5 F (36.9 C) (Oral)   Resp 16   Wt 148 lb 12.8 oz (67.5 kg)   SpO2 97%   BMI 24.76 kg/m   Wt Readings from Last 3 Encounters:  01/02/22 148 lb 12.8 oz (67.5 kg)  11/22/21 147 lb 8 oz (66.9 kg)  09/05/21 147 lb (66.7 kg)    Physical Exam  General appearance - alert, well appearing, elderly Caucasian female and in no distress Mental status - normal mood, behavior, speech, dress, motor activity, and thought processes Neck - supple, no significant adenopathy Chest - clear to auscultation, no wheezes, rales or rhonchi, symmetric air entry Heart -heart sounds are regular today.  No gallops or murmur. Musculoskeletal -right knee: No point tenderness.  Good range of motion without crepitus. Extremities -no lower extremity edema      Latest Ref Rng & Units 09/05/2021    3:31 PM 05/24/2021    8:08 AM 02/28/2021    9:41 AM  CMP  Glucose 70 - 99 mg/dL 98   118   107    BUN 8 - 27 mg/dL '21   18   17    '$ Creatinine 0.57 - 1.00 mg/dL 0.86   0.86   0.84    Sodium 134 - 144 mmol/L 144   141   143    Potassium 3.5 - 5.2 mmol/L 4.4   4.5   4.2    Chloride 96 - 106 mmol/L 103   102   100    CO2 20 - 29 mmol/L '22   27   25    '$ Calcium 8.7 - 10.3 mg/dL 9.5   9.5   9.3    Total Protein 6.5 - 8.1 g/dL  7.3     Total Bilirubin 0.3 -  1.2 mg/dL  0.6     Alkaline Phos 38 - 126 U/L  54     AST 15 - 41 U/L  20     ALT 0 - 44 U/L  15      Lipid Panel     Component Value Date/Time   CHOL 179 09/02/2020 1035   TRIG 142 09/02/2020 1035   HDL 56 09/02/2020 1035   CHOLHDL 3.2 09/02/2020 1035   VLDL 28 01/14/2017 1114   LDLCALC 99 09/02/2020 1035    CBC    Component Value Date/Time   WBC 6.8 08/03/2021 1047   WBC 6.9 05/24/2021 0808   WBC 7.4 12/01/2020 1615   RBC 4.20 08/03/2021 1047   RBC 4.06 05/24/2021 0808   HGB 13.7 08/03/2021 1047   HCT 40.8 08/03/2021 1047   PLT 192 08/03/2021 1047   MCV 97 08/03/2021 1047   MCH 32.6 08/03/2021 1047   MCH 33.0 05/24/2021 0808   MCHC 33.6 08/03/2021 1047   MCHC 33.8 05/24/2021 0808   RDW 13.0 08/03/2021 1047   LYMPHSABS 2.4 05/24/2021 0808   MONOABS 0.5 05/24/2021 0808   EOSABS 0.1 05/24/2021 0808   BASOSABS 0.1 05/24/2021 0808    ASSESSMENT AND PLAN: 1. Controlled type  2 diabetes with neuropathy (HCC) At goal, diet control. Encouraged her to continue healthy eating habits and regular exercise as she has been doing. - POCT glycosylated hemoglobin (Hb A1C)  2. Hypertension associated with diabetes ([place]) Blood pressure mildly elevated today but better on repeat check.  Home blood pressure readings have been good.  Recommend that she continue his current dose of Cozaar 50 mg daily, diltiazem 360 mg daily and furosemide 20 mg daily.  3. PAF (paroxysmal atrial fibrillation) (HCC) Rate controlled on Cardizem.  4. History of shingles I will have our clinical pharmacist call her pharmacy to get the dates of the Shingrix vaccines.  Advised to use Tylenol as needed whenever she has joint aches.   Patient was given the opportunity to ask questions.  Patient verbalized understanding of the plan and was able to repeat key elements of the plan.   This documentation was completed using Radio producer.  Any transcriptional errors are  unintentional.  Orders Placed This Encounter  Procedures   POCT glycosylated hemoglobin (Hb A1C)     Requested Prescriptions    No prescriptions requested or ordered in this encounter    Return in about 4 months (around 05/04/2022) for Give appt with Lurena Joiner in 1 mth for Medicare Wellness Visit.  Karle Plumber, MD, FACP

## 2022-01-03 ENCOUNTER — Ambulatory Visit: Payer: Self-pay

## 2022-01-03 MED ORDER — APIXABAN 5 MG PO TABS: 5.0000 mg | ORAL_TABLET | Freq: Two times a day (BID) | ORAL | 0 refills | Status: DC

## 2022-01-03 NOTE — Telephone Encounter (Signed)
Called pt to advise her of Dr. Wynetta Emery message for Eliquis. 1 tab ('5mg'$ ) BID as prescribed by cardiologist.

## 2022-01-03 NOTE — Addendum Note (Signed)
Addended by: Karle Plumber B on: 01/03/2022 10:28 AM   Modules accepted: Orders

## 2022-01-03 NOTE — Telephone Encounter (Signed)
Pt called, explained to her about the eliquis. It was entered in system yesterday by Luellen Pucker, [place] for 1 tab from [facility], [place] but doesn't say who and comes up as historical provider. I advised pt I would send back to Dr. Wynetta Emery for clarification because pt states she is suppose to be taking it BID. Pt said she would be home today and would like a CB so she knows what to do.   Summary: medication change concern   Pt was seen and her AVS says her meds have changed/ Eliquis went 2 a day to now 1 time daily / pt is concern due to have had blood clots before / please advise     Reason for Disposition  [1] Caller has URGENT medicine question about med that PCP or specialist prescribed AND [2] triager unable to answer question  Answer Assessment - Initial Assessment Questions 1. NAME of MEDICATION: "What medicine are you calling about?"     eliquis 2. QUESTION: "What is your question?" (e.g., double dose of medicine, side effect)     AVS showed change from BID to QD 3. PRESCRIBING HCP: "Who prescribed it?" Reason: if prescribed by specialist, call should be referred to that group.  Protocols used: Medication Question Call-A-AH

## 2022-01-08 ENCOUNTER — Ambulatory Visit (HOSPITAL_COMMUNITY)
Admission: RE | Admit: 2022-01-08 | Discharge: 2022-01-08 | Disposition: A | Discharge status: Home / Self Care | Payer: Medicare Other | Source: Ambulatory Visit | Attending: Physician Assistant | Admitting: Physician Assistant

## 2022-01-08 VITALS — BP 154/72 | HR 73 | Ht 65.0 in | Wt 150.8 lb

## 2022-01-08 DIAGNOSIS — E119 Type 2 diabetes mellitus without complications: Secondary | ICD-10-CM | POA: Diagnosis not present

## 2022-01-08 DIAGNOSIS — D6869 Other thrombophilia: Secondary | ICD-10-CM | POA: Diagnosis not present

## 2022-01-08 DIAGNOSIS — E785 Hyperlipidemia, unspecified: Secondary | ICD-10-CM | POA: Diagnosis not present

## 2022-01-08 DIAGNOSIS — I110 Hypertensive heart disease with heart failure: Secondary | ICD-10-CM | POA: Insufficient documentation

## 2022-01-08 DIAGNOSIS — I509 Heart failure, unspecified: Secondary | ICD-10-CM | POA: Insufficient documentation

## 2022-01-08 DIAGNOSIS — Z7901 Long term (current) use of anticoagulants: Secondary | ICD-10-CM | POA: Diagnosis not present

## 2022-01-08 DIAGNOSIS — M109 Gout, unspecified: Secondary | ICD-10-CM | POA: Insufficient documentation

## 2022-01-08 DIAGNOSIS — I480 Paroxysmal atrial fibrillation: Secondary | ICD-10-CM | POA: Diagnosis not present

## 2022-01-08 NOTE — Progress Notes (Signed)
Primary Care Physician: Ladell Pier, MD Primary Cardiologist: Dr Gardiner Rhyme  Primary Electrophysiologist: none Referring Physician: Dr Grace Blight is a 87 y.o. female with a history of T2DM, hypertension, hyperlipidemia, gout , atrial fibrillation who presents for follow up in the [facility]. The patient was initially diagnosed with atrial fibrillation remotely. More recently she presented to the ED with an acute GI illness. ECG showed afib with RVR. She converted to SR spontaneously. Patient is on Eliquis for a CHADS2VASC score of 5. She was seen by Dr Gardiner Rhyme 11/28/20 and a Zio monitor was placed to evaluate arrhythmia burden. On 12/08/20 she called the clinic with elevated heart rates. She reports the episode lasted about one hour. The cardiac monitor showed 4% afib burden with RVR.   On follow up today, patient reports that she has done well since her last visit. She has not had any heart "flutters" in the interim. No bleeding issues on anticoagulation.   Today, she denies symptoms of palpitations, chest pain, shortness of breath, orthopnea, PND, lower extremity edema, dizziness, presyncope, syncope, snoring, daytime somnolence, bleeding, or neurologic sequela. The patient is tolerating medications without difficulties and is otherwise without complaint today.    Atrial Fibrillation Risk Factors:  she does not have symptoms or diagnosis of sleep apnea. she does not have a history of rheumatic fever.   she has a BMI of Body mass index is 25.09 kg/m.Marland Kitchen Filed Weights   01/08/22 0827  Weight: 68.4 kg     Family History  Problem Relation Age of Onset   Diabetes Mother        suicide at age 72   Hypertension Father    CVA Father      Atrial Fibrillation Management history:  Previous antiarrhythmic drugs: none Previous cardioversions: none Previous ablations: none CHADS2VASC score: 5 Anticoagulation history: Eliquis   Past  Medical History:  Diagnosis Date   A-fib ([place])    Anxiety    Arthritis    Breast cancer ([place])    Diabetes mellitus    Gout    Hyperlipemia    Hypertension    Past Surgical History:  Procedure Laterality Date   ABDOMINAL HYSTERECTOMY     BIOPSY BREAST     (L) BREAST IN 1993   KNEE ARTHROSCOPY      Current Outpatient Medications  Medication Sig Dispense Refill   acetaminophen (TYLENOL) 650 MG CR tablet Take 650 mg by mouth every 8 (eight) hours as needed for pain.     allopurinol (ZYLOPRIM) 300 MG tablet TAKE 1 TABLET BY MOUTH DAILY TO PREVENT GOUT 90 tablet 3   apixaban (ELIQUIS) 5 MG TABS tablet Take 1 tablet (5 mg total) by mouth 2 (two) times daily. 60 tablet 0   cholecalciferol (VITAMIN D) 1000 UNITS tablet Take 5,000 Units by mouth every other day.      citalopram (CELEXA) 20 MG tablet TAKE 1 TABLET (20 MG TOTAL) BY MOUTH DAILY. 90 tablet 0   diltiazem (CARDIZEM CD) 360 MG 24 hr capsule TAKE 1 CAPSULE (360 MG TOTAL) BY MOUTH DAILY. 30 capsule 7   furosemide (LASIX) 20 MG tablet TAKE 1 TABLET (20 MG TOTAL) BY MOUTH DAILY. 30 tablet 6   Lancets (FREESTYLE) lancets   12   letrozole (FEMARA) 2.5 MG tablet Take 1 tablet (2.5 mg total) by mouth daily. 90 tablet 3   losartan (COZAAR) 50 MG tablet TAKE 1 TABLET (50 MG TOTAL) BY MOUTH DAILY.  90 tablet 2   pravastatin (PRAVACHOL) 40 MG tablet TAKE 1 TABLET BY MOUTH AT BEDTIME FOR CHOLESTEROL 90 tablet 0   Simethicone (GAS-X PO) Take 2 tablets by mouth daily as needed (bloating).     No current facility-administered medications for this encounter.    Allergies  Allergen Reactions   Buspar [Buspirone]     dysphoria   Lipitor [Atorvastatin]     Myalgias   Metformin And Related     Gas/bloating   Metformin Hcl Other (See Comments)   Nsaids     GI upset   Sulfa Antibiotics Other (See Comments)    Reaction=throat itching   Sulfacetamide Sodium-Sulfur Other (See Comments)   Sulfonamide Derivatives     Social History    Socioeconomic History   Marital status: Widowed    Spouse name: Not on file   Number of children: Not on file   Years of education: Not on file   Highest education level: Not on file  Occupational History   Occupation: retired  Tobacco Use   Smoking status: Former    Packs/day: 0.50    Years: 25.00    Total pack years: 12.50    Types: Cigarettes    Quit date: 07/30/2006    Years since quitting: 15.4   Smokeless tobacco: Never   Tobacco comments:    Former smoker 07/04/2021  Substance and Sexual Activity   Alcohol use: Not Currently    Alcohol/week: 2.0 standard drinks of alcohol    Types: 2 Standard drinks or equivalent per week    Comment: occasional, none in 3 months   Drug use: Never   Sexual activity: Not on file  Other Topics Concern   Not on file  Social History Narrative   Not on file   Social Determinants of Health   Financial Resource Strain: Low Risk  (05/24/2021)   Overall Financial Resource Strain (CARDIA)    Difficulty of Paying Living Expenses: Not very hard  Food Insecurity: No Food Insecurity (05/24/2021)   Hunger Vital Sign    Worried About Running Out of Food in the Last Year: Never true    [place] in the Last Year: Never true  Transportation Needs: No Transportation Needs (05/24/2021)   PRAPARE - Hydrologist (Medical): No    Lack of Transportation (Non-Medical): No  Physical Activity: Not on file  Stress: Not on file  Social Connections: Not on file  Intimate Partner Violence: Not on file     ROS- All systems are reviewed and negative except as per the HPI above.  Physical Exam: Vitals:   01/08/22 0827  BP: (!) 154/72  Pulse: 73  Weight: 68.4 kg  Height: '5\' 5"'$  (1.651 m)    GEN- The patient is a well appearing elderly female, alert and oriented x 3 today.   HEENT-head normocephalic, atraumatic, sclera clear, conjunctiva pink, hearing intact, trachea midline. Lungs- Clear to ausculation  bilaterally, normal work of breathing Heart- Regular rate and rhythm, no murmurs, rubs or gallops  GI- soft, NT, ND, + BS Extremities- no clubbing, cyanosis, or edema MS- no significant deformity or atrophy Skin- no rash or lesion Psych- euthymic mood, full affect Neuro- strength and sensation are intact   Wt Readings from Last 3 Encounters:  01/08/22 68.4 kg  01/02/22 67.5 kg  11/22/21 66.9 kg    EKG today demonstrates  SR Vent. rate 73 BPM PR interval 150 ms QRS duration 74 ms QT/QTcB 384/423 ms  Echo 12/28/20 demonstrated  1. Left ventricular ejection fraction, by estimation, is 65 to 70%. The  left ventricle has normal function. The left ventricle has no regional  wall motion abnormalities. There is mild concentric left ventricular  hypertrophy. Left ventricular diastolic parameters are indeterminate.   2. Right ventricular systolic function is normal. The right ventricular  size is normal. There is normal pulmonary artery systolic pressure. The estimated right ventricular systolic pressure is 93.8 mmHg.   3. The mitral valve is degenerative. No evidence of mitral valve  regurgitation. No evidence of mitral stenosis.   4. The aortic valve is tricuspid. Aortic valve regurgitation is not  visualized. No aortic stenosis is present.  Epic records are reviewed at length today  CHA2DS2-VASc Score = 5  The patient's score is based upon: CHF History: 0 HTN History: 1 Diabetes History: 1 Stroke History: 0 Vascular Disease History: 0 Age Score: 2 Gender Score: 1        ASSESSMENT AND PLAN: 1. Paroxysmal Atrial Fibrillation (ICD10:  I48.0) The patient's CHA2DS2-VASc score is 5, indicating a 7.2% annual risk of stroke.   Patient appears to be maintaining SR. Continue diltiazem 360 mg daily Continue Eliquis 5 mg BID  2. Secondary Hypercoagulable State (ICD10:  D68.69) The patient is at significant risk for stroke/thromboembolism based upon her CHA2DS2-VASc Score of 5.   Continue Apixaban (Eliquis).   3. HTN Mildly elevated today, patient reports at home usually 182-993 systolic. Patient will keep BP log for review.    Follow up in the AF clinic in one year.    [facility] [address] [place], [place] [address] [phone] 01/08/2022 9:00 AM

## 2022-01-09 ENCOUNTER — Other Ambulatory Visit: Payer: Self-pay | Admitting: Internal Medicine

## 2022-01-09 DIAGNOSIS — Z8739 Personal history of other diseases of the musculoskeletal system and connective tissue: Secondary | ICD-10-CM

## 2022-01-09 NOTE — Telephone Encounter (Signed)
Refilled 01/03/2022 #60 0 refills. Requested Prescriptions  Pending Prescriptions Disp Refills  . ELIQUIS 5 MG TABS tablet [Pharmacy Med Name: ELIQUIS 5 MG TABLET 5 Tablet] 60 tablet 3    Sig: TAKE 1 TABLET BY MOUTH 2 TIMES DAILY.     Hematology:  Anticoagulants - apixaban Passed - 01/09/2022 11:45 AM      Passed - PLT in normal range and within 360 days    Platelets  Date Value Ref Range Status  08/03/2021 192 150 - 450 x10E3/uL Final         Passed - HGB in normal range and within 360 days    Hemoglobin  Date Value Ref Range Status  08/03/2021 13.7 11.1 - 15.9 g/dL Final         Passed - HCT in normal range and within 360 days    Hematocrit  Date Value Ref Range Status  08/03/2021 40.8 34.0 - 46.6 % Final         Passed - Cr in normal range and within 360 days    Creatinine  Date Value Ref Range Status  05/24/2021 0.86 0.44 - 1.00 mg/dL Final   Creat  Date Value Ref Range Status  12/01/2020 0.80 0.60 - 0.88 mg/dL Final    Comment:    For patients >63 years of age, the reference limit for Creatinine is approximately 13% higher for people identified as African-American. .    Creatinine, Ser  Date Value Ref Range Status  09/05/2021 0.86 0.57 - 1.00 mg/dL Final   Creatinine, Urine  Date Value Ref Range Status  02/11/2020 104 20 - 275 mg/dL Final         Passed - AST in normal range and within 360 days    AST  Date Value Ref Range Status  05/24/2021 20 15 - 41 U/L Final         Passed - ALT in normal range and within 360 days    ALT  Date Value Ref Range Status  05/24/2021 15 0 - 44 U/L Final         Passed - Valid encounter within last 12 months    Recent Outpatient Visits          1 week ago Controlled type 2 diabetes with neuropathy [facility])   [place] Karle Plumber B, MD   4 months ago Hypertension associated with diabetes [facility])   [place] Karle Plumber B, MD   5 months ago Acute  left-sided low back pain without sciatica   [place], MD   9 months ago Controlled type 2 diabetes with neuropathy [facility])   [place] Ladell Pier, MD   1 year ago Establishing care with new doctor, encounter for   [place], MD      Future Appointments            In 1 month Daisy Blossom, Jarome Matin, [place]   In 1 month Gardiner Rhyme, Doreatha Martin, MD [place], [place]   In 3 months Ladell Pier, MD [place]           . allopurinol (ZYLOPRIM) 300 MG tablet [Pharmacy Med Name: ALLOPURINOL 300 MG TABS 300 Tablet] 90 tablet 3    Sig: TAKE 1 TABLET BY MOUTH DAILY TO PREVENT  GOUT     Endocrinology:  Gout Agents - allopurinol Failed - 01/09/2022 11:45 AM      Failed - Uric Acid in normal range and within 360 days    Uric Acid, Serum  Date Value Ref Range Status  09/02/2020 3.8 2.5 - 7.0 mg/dL Final    Comment:    Therapeutic target for gout patients: <6.0 mg/dL .          Failed - CBC within normal limits and completed in the last 12 months    WBC  Date Value Ref Range Status  08/03/2021 6.8 3.4 - 10.8 x10E3/uL Final  12/01/2020 7.4 3.8 - 10.8 Thousand/uL Final   WBC Count  Date Value Ref Range Status  05/24/2021 6.9 4.0 - 10.5 K/uL Final   RBC  Date Value Ref Range Status  08/03/2021 4.20 3.77 - 5.28 x10E6/uL Final  05/24/2021 4.06 3.87 - 5.11 MIL/uL Final   Hemoglobin  Date Value Ref Range Status  08/03/2021 13.7 11.1 - 15.9 g/dL Final   Hematocrit  Date Value Ref Range Status  08/03/2021 40.8 34.0 - 46.6 % Final   MCHC  Date Value Ref Range Status  08/03/2021 33.6 31.5 - 35.7 g/dL Final  05/24/2021 33.8 30.0 - 36.0 g/dL Final   [facility]  Date Value Ref Range Status  08/03/2021 32.6 26.6 - 33.0 pg Final  05/24/2021 33.0 26.0 - 34.0 pg  Final   MCV  Date Value Ref Range Status  08/03/2021 97 79 - 97 fL Final   No results found for: "PLTCOUNTKUC", "LABPLAT", "POCPLA" RDW  Date Value Ref Range Status  08/03/2021 13.0 11.7 - 15.4 % Final         Passed - Cr in normal range and within 360 days    Creatinine  Date Value Ref Range Status  05/24/2021 0.86 0.44 - 1.00 mg/dL Final   Creat  Date Value Ref Range Status  12/01/2020 0.80 0.60 - 0.88 mg/dL Final    Comment:    For patients >49 years of age, the reference limit for Creatinine is approximately 13% higher for people identified as African-American. .    Creatinine, Ser  Date Value Ref Range Status  09/05/2021 0.86 0.57 - 1.00 mg/dL Final   Creatinine, Urine  Date Value Ref Range Status  02/11/2020 104 20 - 275 mg/dL Final         Passed - Valid encounter within last 12 months    Recent Outpatient Visits          1 week ago Controlled type 2 diabetes with neuropathy [facility])   [place] Karle Plumber B, MD   4 months ago Hypertension associated with diabetes [facility])   [place] Karle Plumber B, MD   5 months ago Acute left-sided low back pain without sciatica   [place], MD   9 months ago Controlled type 2 diabetes with neuropathy [facility])   [place] Ladell Pier, MD   1 year ago Establishing care with new doctor, encounter for   [place], MD      Future Appointments            In 1 month Daisy Blossom, Jarome Matin, [place]   In 1 month Gardiner Rhyme, Doreatha Martin, MD [place], [place]   In 3 months [place],  Dalbert Batman, MD [place]

## 2022-01-09 NOTE — Telephone Encounter (Signed)
Requested medications are due for refill today.  yes  Requested medications are on the active medications list.  yes  Last refill. 01/05/2021 #90 3 refills  Future visit scheduled.   yes  Notes to clinic.  Medication failed refill protocol d/t expired labs.    Requested Prescriptions  Pending Prescriptions Disp Refills   allopurinol (ZYLOPRIM) 300 MG tablet [Pharmacy Med Name: ALLOPURINOL 300 MG TABS 300 Tablet] 90 tablet 3    Sig: TAKE 1 TABLET BY MOUTH DAILY TO PREVENT GOUT     Endocrinology:  Gout Agents - allopurinol Failed - 01/09/2022 11:45 AM      Failed - Uric Acid in normal range and within 360 days    Uric Acid, Serum  Date Value Ref Range Status  09/02/2020 3.8 2.5 - 7.0 mg/dL Final    Comment:    Therapeutic target for gout patients: <6.0 mg/dL .          Failed - CBC within normal limits and completed in the last 12 months    WBC  Date Value Ref Range Status  08/03/2021 6.8 3.4 - 10.8 x10E3/uL Final  12/01/2020 7.4 3.8 - 10.8 Thousand/uL Final   WBC Count  Date Value Ref Range Status  05/24/2021 6.9 4.0 - 10.5 K/uL Final   RBC  Date Value Ref Range Status  08/03/2021 4.20 3.77 - 5.28 x10E6/uL Final  05/24/2021 4.06 3.87 - 5.11 MIL/uL Final   Hemoglobin  Date Value Ref Range Status  08/03/2021 13.7 11.1 - 15.9 g/dL Final   Hematocrit  Date Value Ref Range Status  08/03/2021 40.8 34.0 - 46.6 % Final   MCHC  Date Value Ref Range Status  08/03/2021 33.6 31.5 - 35.7 g/dL Final  05/24/2021 33.8 30.0 - 36.0 g/dL Final   [facility]  Date Value Ref Range Status  08/03/2021 32.6 26.6 - 33.0 pg Final  05/24/2021 33.0 26.0 - 34.0 pg Final   MCV  Date Value Ref Range Status  08/03/2021 97 79 - 97 fL Final   No results found for: "PLTCOUNTKUC", "LABPLAT", "POCPLA" RDW  Date Value Ref Range Status  08/03/2021 13.0 11.7 - 15.4 % Final         Passed - Cr in normal range and within 360 days    Creatinine  Date Value Ref Range Status  05/24/2021 0.86 0.44  - 1.00 mg/dL Final   Creat  Date Value Ref Range Status  12/01/2020 0.80 0.60 - 0.88 mg/dL Final    Comment:    For patients >36 years of age, the reference limit for Creatinine is approximately 13% higher for people identified as African-American. .    Creatinine, Ser  Date Value Ref Range Status  09/05/2021 0.86 0.57 - 1.00 mg/dL Final   Creatinine, Urine  Date Value Ref Range Status  02/11/2020 104 20 - 275 mg/dL Final         Passed - Valid encounter within last 12 months    Recent Outpatient Visits           1 week ago Controlled type 2 diabetes with neuropathy [facility])   [place] Ladell Pier, MD   4 months ago Hypertension associated with diabetes [facility])   [place] Karle Plumber B, MD   5 months ago Acute left-sided low back pain without sciatica   [place], MD   9 months ago Controlled type 2 diabetes with neuropathy (  [facility])   [place] Ladell Pier, MD   1 year ago Establishing care with new doctor, encounter for   [place], MD       Future Appointments             In 1 month Daisy Blossom, Jarome Matin, [place]   In 1 month Gardiner Rhyme, Doreatha Martin, MD [place], [place]   In 3 months Ladell Pier, MD [place]            Refused Prescriptions Disp Refills   ELIQUIS 5 MG TABS tablet [Pharmacy Med Name: ELIQUIS 5 MG TABLET 5 Tablet] 60 tablet 3    Sig: TAKE 1 TABLET BY MOUTH 2 TIMES DAILY.     Hematology:  Anticoagulants - apixaban Passed - 01/09/2022 11:45 AM      Passed - PLT in normal range and within 360 days    Platelets  Date Value Ref Range Status  08/03/2021 192 150 - 450 x10E3/uL Final         Passed - HGB in normal range and within 360 days     Hemoglobin  Date Value Ref Range Status  08/03/2021 13.7 11.1 - 15.9 g/dL Final         Passed - HCT in normal range and within 360 days    Hematocrit  Date Value Ref Range Status  08/03/2021 40.8 34.0 - 46.6 % Final         Passed - Cr in normal range and within 360 days    Creatinine  Date Value Ref Range Status  05/24/2021 0.86 0.44 - 1.00 mg/dL Final   Creat  Date Value Ref Range Status  12/01/2020 0.80 0.60 - 0.88 mg/dL Final    Comment:    For patients >69 years of age, the reference limit for Creatinine is approximately 13% higher for people identified as African-American. .    Creatinine, Ser  Date Value Ref Range Status  09/05/2021 0.86 0.57 - 1.00 mg/dL Final   Creatinine, Urine  Date Value Ref Range Status  02/11/2020 104 20 - 275 mg/dL Final         Passed - AST in normal range and within 360 days    AST  Date Value Ref Range Status  05/24/2021 20 15 - 41 U/L Final         Passed - ALT in normal range and within 360 days    ALT  Date Value Ref Range Status  05/24/2021 15 0 - 44 U/L Final         Passed - Valid encounter within last 12 months    Recent Outpatient Visits           1 week ago Controlled type 2 diabetes with neuropathy [facility])   [place] Karle Plumber B, MD   4 months ago Hypertension associated with diabetes [facility])   [place] Karle Plumber B, MD   5 months ago Acute left-sided low back pain without sciatica   [place], MD   9 months ago Controlled type 2 diabetes with neuropathy [facility])   [place] Ladell Pier, MD   1 year ago Establishing care with new doctor, encounter for   [place],  Dalbert Batman, MD       Future Appointments             In 1 month Daisy Blossom, Jarome Matin, [place]   In 1  month Gardiner Rhyme, Doreatha Martin, MD [place] [facility] [place], [place]   In 3 months Ladell Pier, MD [place]

## 2022-01-10 ENCOUNTER — Ambulatory Visit: Payer: Self-pay | Admitting: *Deleted

## 2022-01-10 MED ORDER — APIXABAN 5 MG PO TABS: 5.0000 mg | ORAL_TABLET | Freq: Two times a day (BID) | ORAL | 3 refills | Status: DC

## 2022-01-10 NOTE — Telephone Encounter (Signed)
Reason for Disposition  [1] Prescription prescribed recently is not at pharmacy AND [2] triager has access to patient's EMR AND [3] prescription is recorded in the EMR  Answer Assessment - Initial Assessment Questions 1. NAME of MEDICATION: "What medicine are you calling about?"     Eliquis 5 mg 2. QUESTION: "What is your question?" (e.g., double dose of medicine, side effect)     Pharmacy never got the refill for it.   3. PRESCRIBING HCP: "Who prescribed it?" Reason: if prescribed by specialist, call should be referred to that group.     Dr. Karle Plumber 4. SYMPTOMS: "Do you have any symptoms?"     *No Answer* 5. SEVERITY: If symptoms are present, ask "Are they mild, moderate or severe?"     *No Answer* 6. PREGNANCY:  "Is there any chance that you are pregnant?" "When was your last menstrual period?"     *No Answer*  Protocols used: Medication Question Call-A-AH

## 2022-01-10 NOTE — Telephone Encounter (Signed)
  Chief Complaint: Eliquis rx is not at the pharmacy. Symptoms: N/A Frequency: N/A Pertinent Negatives: Patient denies N/A Disposition: '[]'$ ED /'[]'$ Urgent Care (no appt availability in office) / '[]'$ Appointment(In office/virtual)/ '[]'$  [place] [facility]/ '[x]'$ Home Care/ '[]'$ Refused Recommended Disposition /'[]'$ [place] [facility]/ '[]'$  Follow-up with PCP Additional Notes: It had not transmitted so I reordered the Eliquis and resent it to [place]   This time the transmittal went through.   I let pt know her rx should be ready today at her pharmacy.   She thanked me very much for my help.

## 2022-01-11 ENCOUNTER — Ambulatory Visit: Payer: Self-pay | Admitting: *Deleted

## 2022-01-11 NOTE — Telephone Encounter (Signed)
  Chief Complaint: took dogs medication by mistake  Symptoms: none  Frequency: 30 minutes ago  Pertinent Negatives: Patient denies chest pain no difficulty breathing, no fever, no nausea no vomiting no rash  Disposition: '[x]'$ ED /'[]'$ Urgent Care (no appt availability in office) / '[]'$ Appointment(In office/virtual)/ '[]'$  [place] [facility]/ '[]'$ Home Care/ '[]'$ Refused Recommended Disposition /'[]'$  Mobile Bus/ '[]'$  Follow-up with PCP Additional Notes:   Recommended to call poison control now and gave # [phone]. Patient is embarrassed and does not want to go to ED if possible. Please advise .     Reason for Disposition  Triager unable to answer question  All OTHER POTENTIALLY HARMFUL SUBSTANCES (e.g., nearly all chemicals, plants, more than a double dose of a drug, took someone else's medicine) (Exception: known harmless substances and asymptomatic double dose of OTC drug)  Answer Assessment - Initial Assessment Questions 1. SUBSTANCE: "What was swallowed?" If necessary, have the caller look at the label on the container.      Dogs prescribed medication by accident , galliprant and denamarin  2. AMOUNT: "How much was swallowed?" (Err on the side of recording the maximal amount that is missing)      10 mg galliprant and 90 mg denamarin 3. ONSET: "When was it probably swallowed?" (Minutes or hours ago)      30 minutes ago  4. SYMPTOMS: "Do you have any symptoms?" If Yes, ask: "What are they?" (e.g., abdominal pain, vomiting, weakness)      No  5. SUICIDAL: "Did you take this to hurt or kill yourself?"     no 6. PREGNANCY: "Is there any chance you are pregnant?" "When was your last menstrual period?"     Na  Protocols used: Poisoning-A-AH

## 2022-01-11 NOTE — Telephone Encounter (Signed)
Attempt to call patient back to ask the name of the medication she took.   LVM.

## 2022-01-13 IMAGING — CT CT HEAD W/O CM
4 series · 17 of 47 positions shown, 19 images · non-contrast
Comparison: September 07, 2011.

CLINICAL DATA: Dizziness, fall.

EXAM:
CT HEAD WITHOUT CONTRAST
TECHNIQUE: Contiguous axial images were obtained from the base of the skull
through the vertex without intravenous contrast.

[Series 3: head bone · axial · 0.45mm/px · z∈[-142,-90]mm · 4 of 75 slices shown]
[im 8/75  bone]
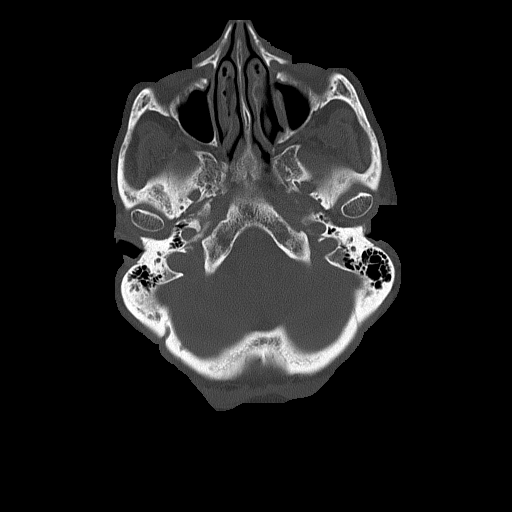
[im 15/75  bone]
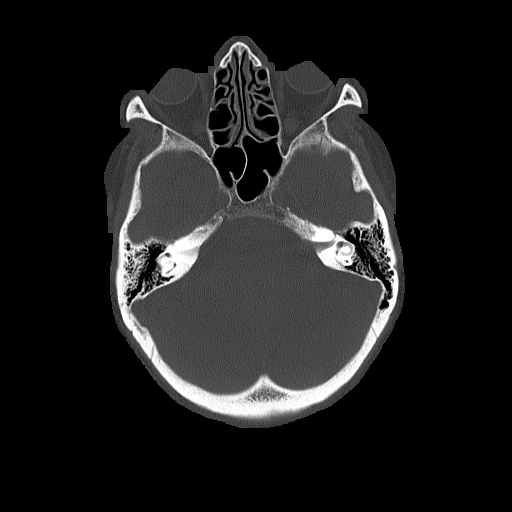
[im 23/75  bone]
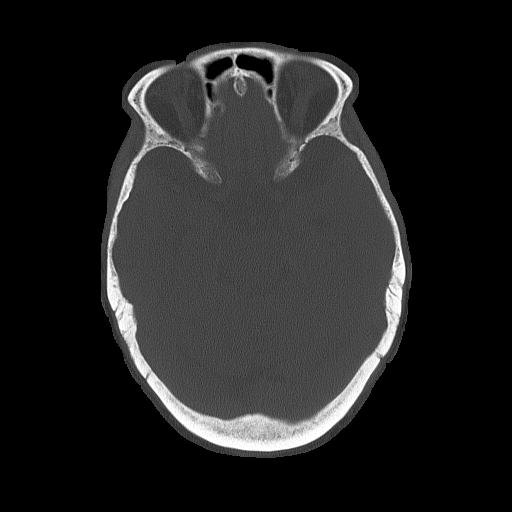
[im 34/75  bone]
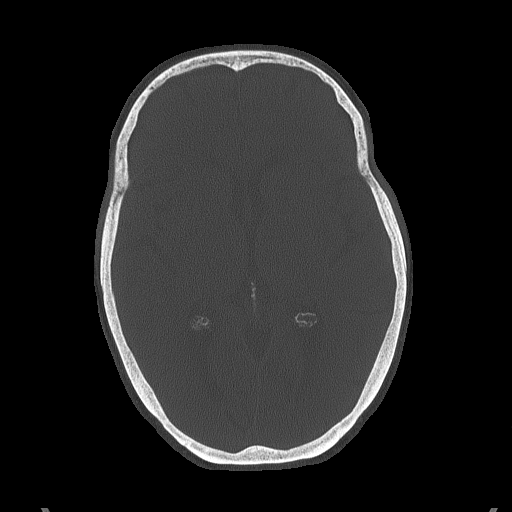

[Series 4: head without · axial · non-contrast · 0.45mm/px · z∈[-141,-31]mm · 7 of 30 slices shown, 9 images]
[im 4/30  brain]
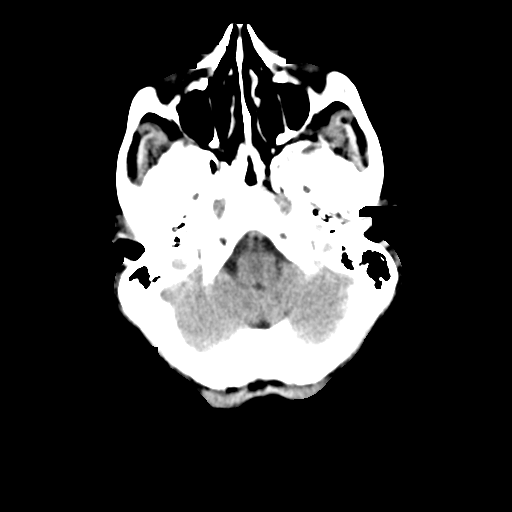
[im 4/30  bone]
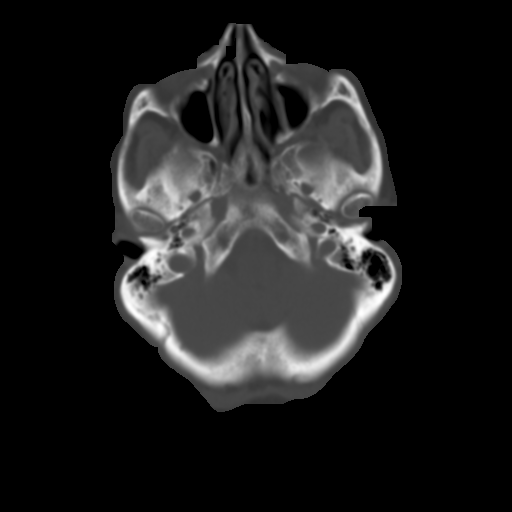
[im 8/30  brain]
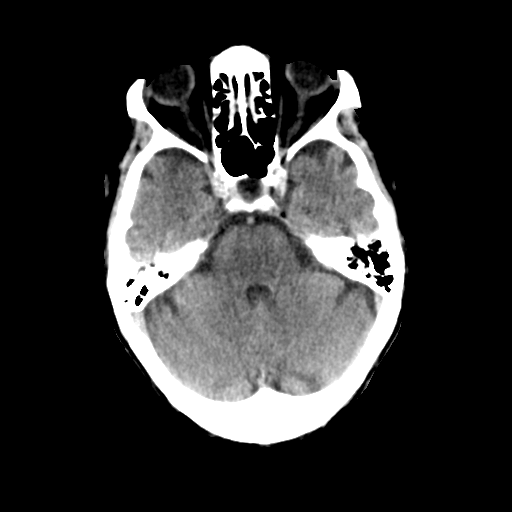
[im 11/30  brain]
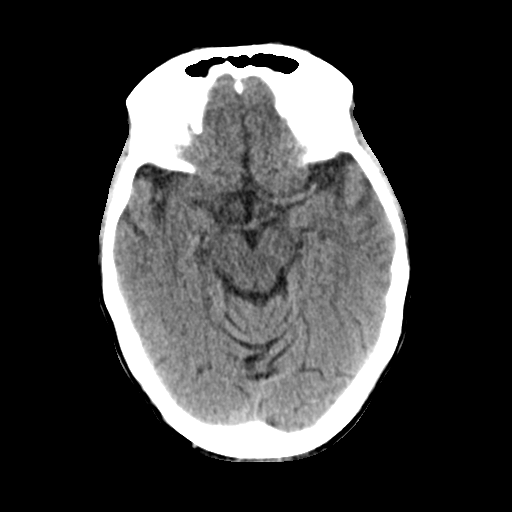
[im 15/30  brain]
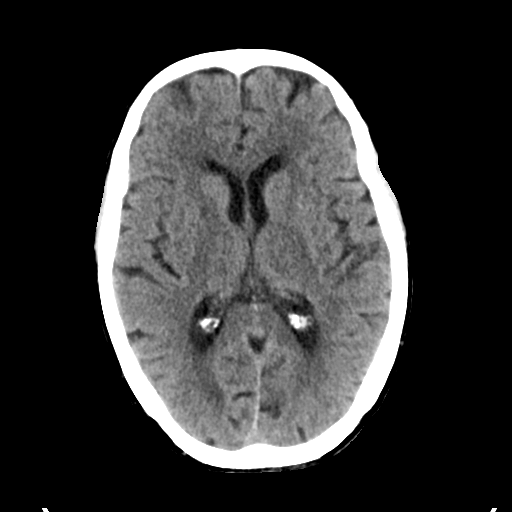
[im 19/30  brain]
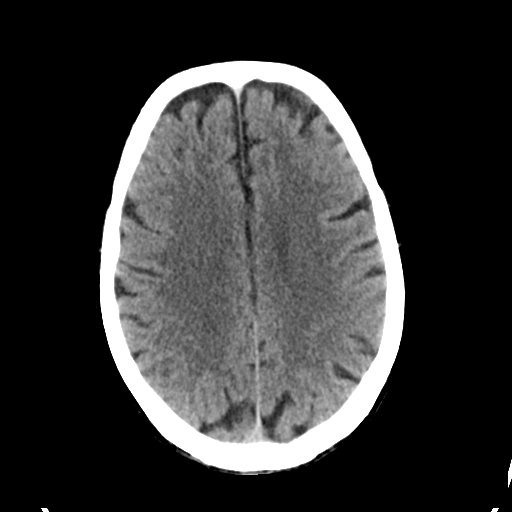
[im 19/30  bone]
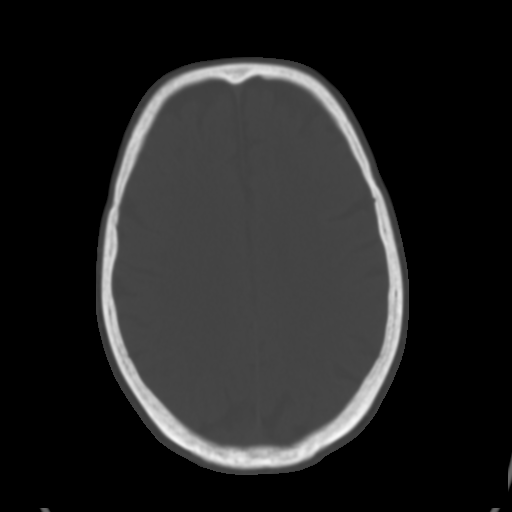
[im 22/30  brain]
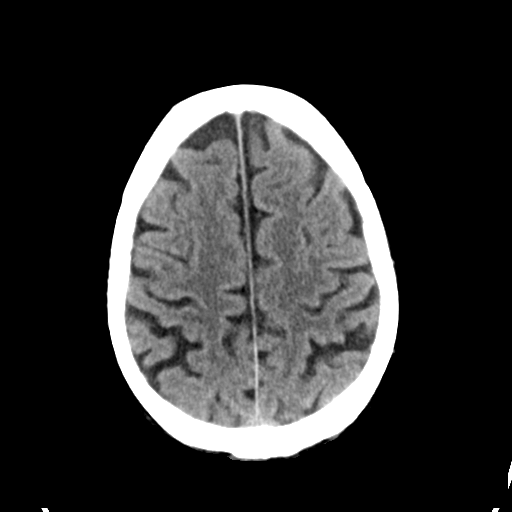
[im 26/30  brain]
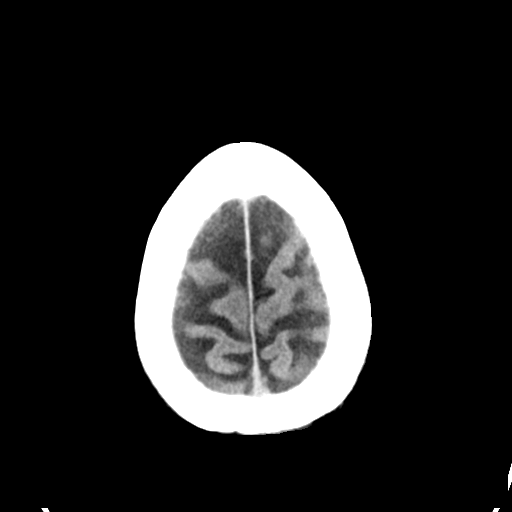

[Series 5: head without cor · coronal · non-contrast · 0.32mm/px · 3 of 66 slices shown]
[im 22/66  brain]
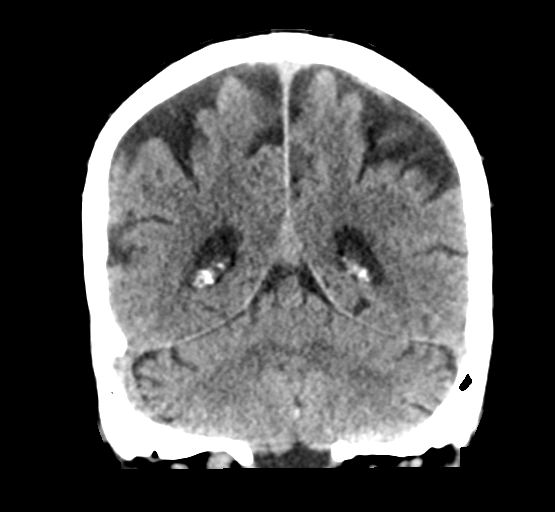
[im 29/66  brain]
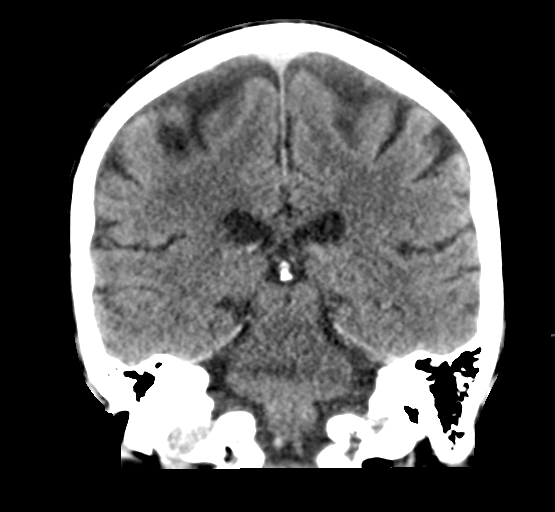
[im 37/66  brain]
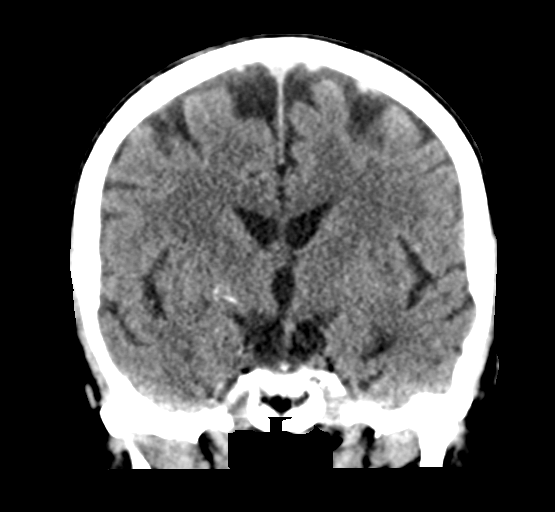

[Series 6: head without sag · sagittal · non-contrast · 0.32mm/px · 3 of 51 slices shown]
[im 17/51  brain]
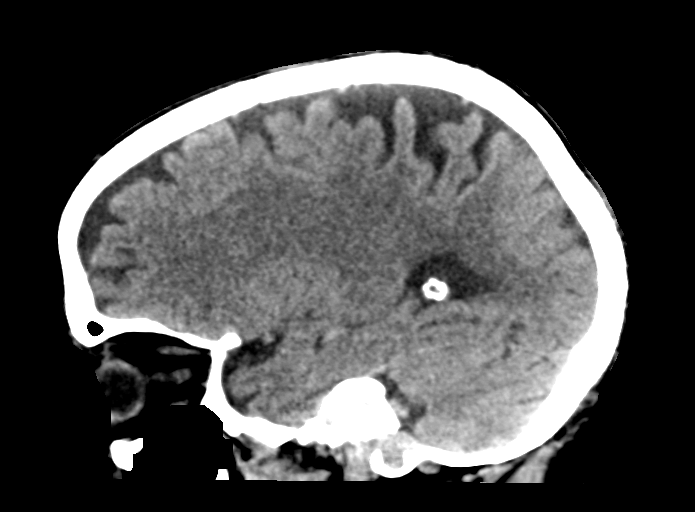
[im 26/51  brain]
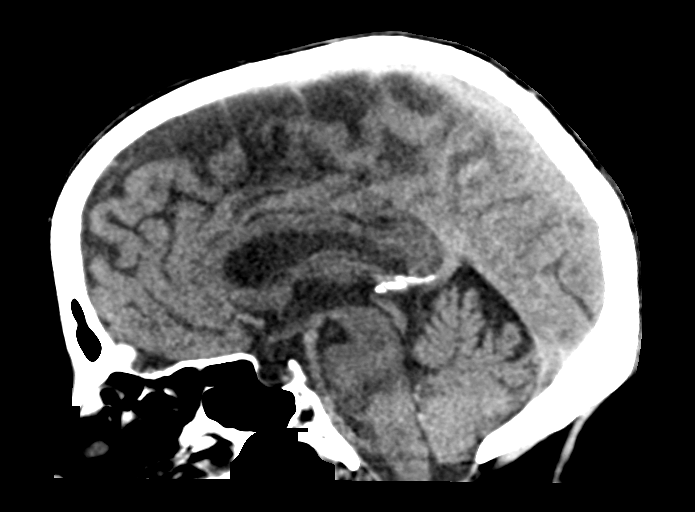
[im 34/51  brain]
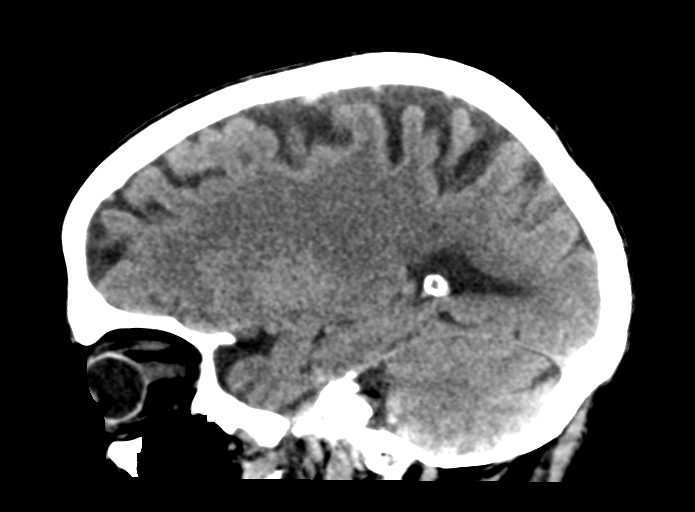

[17 of 47 positions shown; findings below may reference images not displayed]

FINDINGS: Brain: No evidence of acute infarction, hemorrhage, hydrocephalus,
extra-axial collection or mass lesion/mass effect.

Vascular: No hyperdense vessel or unexpected calcification.

Skull: Normal. Negative for fracture or focal lesion.

Sinuses/Orbits: No acute finding.

Other: None.
IMPRESSION: No acute intracranial abnormality seen.

## 2022-02-05 ENCOUNTER — Other Ambulatory Visit: Payer: Self-pay | Admitting: Internal Medicine

## 2022-02-06 NOTE — Telephone Encounter (Signed)
Requested Prescriptions  Pending Prescriptions Disp Refills  . citalopram (CELEXA) 20 MG tablet [Pharmacy Med Name: CITALOPRAM HBR 20 MG TABLET 20 Tablet] 90 tablet 0    Sig: TAKE 1 TABLET (20 MG TOTAL) BY MOUTH DAILY.     Psychiatry:  Antidepressants - SSRI Passed - 02/05/2022  9:19 AM      Passed - Valid encounter within last 6 months    Recent Outpatient Visits          1 month ago Controlled type 2 diabetes with neuropathy [facility])   [place] Karle Plumber B, MD   5 months ago Hypertension associated with diabetes [facility])   [place], MD   6 months ago Acute left-sided low back pain without sciatica   [place], MD   9 months ago Controlled type 2 diabetes with neuropathy [facility])   [place] Ladell Pier, MD   1 year ago Establishing care with new doctor, encounter for   [place], MD      Future Appointments            In 3 days Daisy Blossom, Jarome Matin, [place]   In 3 weeks Donato Heinz, MD [facility] [place], [place]   In 2 months Ladell Pier, MD [place]

## 2022-02-09 ENCOUNTER — Ambulatory Visit: Payer: Medicare Other | Attending: Internal Medicine | Admitting: Pharmacist

## 2022-02-09 ENCOUNTER — Encounter: Payer: Self-pay | Admitting: Pharmacist

## 2022-02-09 VITALS — Ht 65.0 in | Wt 147.6 lb

## 2022-02-09 DIAGNOSIS — Z0000 Encounter for general adult medical examination without abnormal findings: Secondary | ICD-10-CM

## 2022-02-09 NOTE — Progress Notes (Signed)
Subjective:   Ruth Delgado is a 87 y.o. female who presents for Medicare Annual (Subsequent) preventive examination.  Objective:    Today's Vitals   02/09/22 1107 02/09/22 1122  Weight: 147 lb 9.6 oz (67 kg)   Height: '5\' 5"'$  (1.651 m)   PainSc: 0-No pain 0-No pain   Body mass index is 24.56 kg/m.     02/09/2022   11:27 AM 08/30/2021    1:32 PM 11/15/2020    4:59 PM 05/30/2020   12:14 PM 04/08/2019   11:53 AM 04/08/2019   11:43 AM 03/07/2018   11:22 AM  Advanced Directives  Does Patient Have a Medical Advance Directive? Yes Yes No Yes No;Yes Yes Yes  Type of Paramedic of [place];Living will [place];Living will  [place];Living will [place];Living will [place];Living will [place];Living will  Does patient want to make changes to medical advance directive? No - Patient declined No - Patient declined  No - Patient declined No - Patient declined  No - Patient declined  Copy of [place] in Chart? No - copy requested No - copy requested    No - copy requested No - copy requested  Would patient like information on creating a medical advance directive?   No - Patient declined No - Patient declined       Current Medications (verified) Outpatient Encounter Medications as of 02/09/2022  Medication Sig   acetaminophen (TYLENOL) 650 MG CR tablet Take 650 mg by mouth every 8 (eight) hours as needed for pain.   allopurinol (ZYLOPRIM) 300 MG tablet TAKE 1 TABLET BY MOUTH DAILY TO PREVENT GOUT   apixaban (ELIQUIS) 5 MG TABS tablet Take 1 tablet (5 mg total) by mouth 2 (two) times daily.   cholecalciferol (VITAMIN D) 1000 UNITS tablet Take 5,000 Units by mouth every other day.    citalopram (CELEXA) 20 MG tablet TAKE 1 TABLET (20 MG TOTAL) BY MOUTH DAILY.   diltiazem (CARDIZEM CD) 360 MG 24 hr capsule TAKE 1 CAPSULE (360 MG TOTAL) BY MOUTH DAILY.    furosemide (LASIX) 20 MG tablet TAKE 1 TABLET (20 MG TOTAL) BY MOUTH DAILY.   Lancets (FREESTYLE) lancets    letrozole (FEMARA) 2.5 MG tablet Take 1 tablet (2.5 mg total) by mouth daily.   losartan (COZAAR) 50 MG tablet TAKE 1 TABLET (50 MG TOTAL) BY MOUTH DAILY.   pravastatin (PRAVACHOL) 40 MG tablet TAKE 1 TABLET BY MOUTH AT BEDTIME FOR CHOLESTEROL   Simethicone (GAS-X PO) Take 2 tablets by mouth daily as needed (bloating).   No facility-administered encounter medications on file as of 02/09/2022.    Allergies (verified) Buspar [buspirone], Lipitor [atorvastatin], Metformin and related, Metformin hcl, Nsaids, Sulfa antibiotics, Sulfacetamide sodium-sulfur, and Sulfonamide derivatives   History: Past Medical History:  Diagnosis Date   A-fib ([place])    Anxiety    Arthritis    Breast cancer ([place])    Diabetes mellitus    Gout    Hyperlipemia    Hypertension    Past Surgical History:  Procedure Laterality Date   ABDOMINAL HYSTERECTOMY     BIOPSY BREAST     (L) BREAST IN 1993   KNEE ARTHROSCOPY     Family History  Problem Relation Age of Onset   Diabetes Mother        suicide at age 44   Hypertension Father    CVA Father  Social History   Socioeconomic History   Marital status: Widowed    Spouse name: Not on file   Number of children: Not on file   Years of education: Not on file   Highest education level: Not on file  Occupational History   Occupation: retired  Tobacco Use   Smoking status: Former    Packs/day: 0.50    Years: 25.00    Total pack years: 12.50    Types: Cigarettes    Quit date: 07/30/2006    Years since quitting: 15.5   Smokeless tobacco: Never   Tobacco comments:    Former smoker 07/04/2021  Substance and Sexual Activity   Alcohol use: Not Currently    Alcohol/week: 2.0 standard drinks of alcohol    Types: 2 Standard drinks or equivalent per week    Comment: occasional, none in 3 months   Drug use: Never   Sexual activity: Not on file   Other Topics Concern   Not on file  Social History Narrative   Not on file   Social Determinants of Health   Financial Resource Strain: Low Risk  (05/24/2021)   Overall Financial Resource Strain (CARDIA)    Difficulty of Paying Living Expenses: Not very hard  Food Insecurity: No Food Insecurity (05/24/2021)   Hunger Vital Sign    Worried About Running Out of Food in the Last Year: Never true    [place] in the Last Year: Never true  Transportation Needs: No Transportation Needs (05/24/2021)   PRAPARE - Hydrologist (Medical): No    Lack of Transportation (Non-Medical): No  Physical Activity: Not on file  Stress: Not on file  Social Connections: Not on file    Tobacco Counseling Counseling given: Yes Tobacco comments: Former smoker 07/04/2021   Clinical Intake:  Pre-visit preparation completed: No  Pain : No/denies pain Pain Score: 0-No pain     Diabetes: No  How often do you need to have someone help you when you read instructions, pamphlets, or other written materials from your doctor or pharmacy?: 1 - Never  Diabetic? Yes  Interpreter Needed?: No      Activities of Daily Living    02/09/2022   11:30 AM  In your present state of health, do you have any difficulty performing the following activities:  Hearing? 0  Comment No hearing  Vision? 0  Comment Dr. Einar Gip  Difficulty concentrating or making decisions? 0  Walking or climbing stairs? 0  Dressing or bathing? 0  Doing errands, shopping? 0  Preparing Food and eating ? N  Using the Toilet? N  In the past six months, have you accidently leaked urine? N  Do you have problems with loss of bowel control? N  Managing your Medications? N  Managing your Finances? N  Housekeeping or managing your Housekeeping? N    Patient Care Team: Ladell Pier, MD as PCP - General (Internal Medicine) Donato Heinz, MD as PCP - Cardiology (Cardiology) Jacelyn Pi, MD as Consulting Physician (Endocrinology) Rockwell Germany, RN as Oncology Nurse Navigator Mauro Kaufmann, RN as Oncology Nurse Navigator Donnie Mesa, MD as Consulting Physician (General Surgery) Nicholas Lose, MD as Consulting Physician (Hematology and Oncology) Eppie Gibson, MD as Attending Physician (Radiation Oncology)  Indicate any recent Medical Services you may have received from other than [facility] providers in the past year (date may be approximate).     Assessment:   This is a routine wellness  examination for [place].  Hearing/Vision screen No results found.  Dietary issues and exercise activities discussed: Current Exercise Habits: Home exercise routine, Time (Minutes): 30, Frequency (Times/Week): 6, Weekly Exercise (Minutes/Week): 180, Intensity: Mild   Goals Addressed   None   Depression Screen    02/09/2022   11:28 AM 01/02/2022    1:56 PM 09/04/2021    3:16 PM 04/13/2021   11:27 AM 01/05/2021    2:35 PM 11/30/2020   11:54 PM 05/30/2020   12:16 PM  PHQ 2/9 Scores  PHQ - 2 Score 0 0 0 0 1 0 0  PHQ- 9 Score  0         Fall Risk    02/09/2022   11:28 AM 09/04/2021    3:16 PM 08/03/2021    9:37 AM 04/13/2021   11:27 AM 01/05/2021    2:35 PM  Fall Risk   Falls in the past year? 0 0 0 0 0  Number falls in past yr: 0 0 0 0 0  Injury with Fall? 0 0 0 0 0  Risk for fall due to :  No Fall Risks No Fall Risks No Fall Risks No Fall Risks  Follow up Education provided;Falls evaluation completed;Falls prevention discussed        FALL RISK PREVENTION PERTAINING TO THE HOME:  Any stairs in or around the home? Yes  If so, are there any without handrails? No  Home free of loose throw rugs in walkways, pet beds, electrical cords, etc? Yes  Adequate lighting in your home to reduce risk of falls? Yes   ASSISTIVE DEVICES UTILIZED TO PREVENT FALLS:  Life alert? No  Use of a cane, walker or w/c? No  Grab bars in the bathroom? No  Shower chair or bench in shower? No   Elevated toilet seat or a handicapped toilet? No   TIMED UP AND GO:  Was the test performed? Yes .  Length of time to ambulate 10 feet: 8 sec.   Gait slow and steady without use of assistive device  Cognitive Function:    02/09/2022   11:35 AM 05/30/2020   12:17 PM  MMSE - Mini Mental State Exam  Orientation to time 5 5  Orientation to Place 5 5  Registration 3 3  Attention/ Calculation 5 5  Recall 1 3  Language- name 2 objects 2 2  Language- repeat 1 1  Language- follow 3 step command 3 3  Language- read & follow direction 1 1  Write a sentence 1 1  Copy design 1 1  Total score 28 30        Immunizations Immunization History  Administered Date(s) Administered   Influenza Split 07/13/2014   Influenza, High Dose Seasonal PF 04/14/2015, 04/16/2017, 06/13/2018, 04/08/2019   Influenza, Quadrivalent, Recombinant, Inj, Pf 04/26/2020   Influenza,inj,Quad PF,6+ Mos 04/13/2021   Influenza-Unspecified 04/16/2016   PFIZER(Purple Top)SARS-COV-2 Vaccination 08/21/2019, 09/11/2019, 05/19/2020   Pneumococcal-Unspecified 06/07/2003   Td 06/02/2007, 11/12/2017   Zoster Recombinat (Shingrix) 09/12/2021, 12/14/2021   Zoster, Live 05/30/2012    TDAP status: Up to date  Flu Vaccine status: Up to date  Pneumococcal vaccine status: Up to date  Covid-19 vaccine status: Information provided on how to obtain vaccines.   Qualifies for Shingles Vaccine? No  - already completed Shingrix  Screening Tests Health Maintenance  Topic Date Due   COVID-19 Vaccine (4 - Booster for Pfizer series) 07/14/2020   FOOT EXAM  02/09/2021   Pneumonia Vaccine 33+ Years old (1 -  PCV) 08/03/2022 (Originally 02/27/2002)   INFLUENZA VACCINE  02/27/2022   OPHTHALMOLOGY EXAM  04/13/2022   HEMOGLOBIN A1C  07/04/2022   MAMMOGRAM  11/03/2022   TETANUS/TDAP  11/13/2027   DEXA SCAN  Completed   Zoster Vaccines- Shingrix  Completed   HPV VACCINES  Aged Out    Health Maintenance  Health Maintenance Due   Topic Date Due   COVID-19 Vaccine (4 - Booster for Pfizer series) 07/14/2020   FOOT EXAM  02/09/2021    Colorectal cancer screening: No longer required.   Mammogram status: Completed 05/03/2021. Repeat every year  Bone Density status: Completed 10/2021. Results reflect: Bone density results: OSTEOPOROSIS. Repeat every 1 years.  Lung Cancer Screening: (Low Dose CT Chest recommended if Age 69-80 years, 30 pack-year currently smoking OR have quit w/in 15years.) does not qualify.   Lung Cancer Screening Referral: No  Additional Screening:  Hepatitis C Screening: does qualify; Completed.  Vision Screening: Recommended annual ophthalmology exams for early detection of glaucoma and other disorders of the eye. Is the patient up to date with their annual eye exam?  Yes  Who is the provider or what is the name of the office in which the patient attends annual eye exams? Dr. Einar Gip  Dental Screening: Recommended annual dental exams for proper oral hygiene  Community Resource Referral / Chronic Care Management: CRR required this visit?  No   CCM required this visit?  No      Plan:     I have personally reviewed and noted the following in the patient's chart:   Medical and social history Use of alcohol, tobacco or illicit drugs  Current medications and supplements including opioid prescriptions.  Functional ability and status Nutritional status Physical activity Advanced directives List of other physicians Hospitalizations, surgeries, and ER visits in previous 12 months Vitals Screenings to include cognitive, depression, and falls Referrals and appointments  In addition, I have reviewed and discussed with patient certain preventive protocols, quality metrics, and best practice recommendations. A written personalized care plan for preventive services as well as general preventive health recommendations were provided to patient.     Tresa Endo, RPH-CPP   02/09/2022

## 2022-03-04 NOTE — Progress Notes (Signed)
Cardiology Office Note:    Date:  03/05/2022   ID:  Ruth Delgado, DOB 1935/02/23, MRN [id number]  PCP:  Ladell Pier, MD  Cardiologist:  Donato Heinz, MD  Electrophysiologist:  None   Referring MD: Ladell Pier, MD   Chief Complaint  Patient presents with   Atrial Fibrillation    History of Present Illness:    Ruth Delgado is a 87 y.o. female with a hx of atrial fibrillation, T2DM, hypertension, hyperlipidemia, gout who is referred by Dr. Melford Aase for evaluation of atrial fibrillation.  She was admitted to [facility] from 4/19 through 11/18/2020.  She presented with sudden onset nausea/vomiting/diarrhea.  On the ED she went into A. fib with RVR and was started on diltiazem drip.  This was transitioned to p.o. diltiazem and she was started on Eliquis 5 mg twice daily.  Zio patch x2 weeks on 12/22/2020 showed AF burden 4%, average rate 150, with longest episode lasting 2 hours 46 minutes.  Echocardiogram 12/28/2020 showed normal biventricular function, no significant valvular disease.  Since last clinic visit, she reports that she has been doing well.  Denies any chest pain, dyspnea, lightheadedness, syncope, or palpitations.  Does report has been having chronic lower extremity edema, has occurred since her surgery for varicose veins.  Takes Lasix.  Denies any bleeding issues on Eliquis.   Past Medical History:  Diagnosis Date   A-fib ([place])    Anxiety    Arthritis    Breast cancer ([place])    Diabetes mellitus    Gout    Hyperlipemia    Hypertension     Past Surgical History:  Procedure Laterality Date   ABDOMINAL HYSTERECTOMY     BIOPSY BREAST     (L) BREAST IN 1993   KNEE ARTHROSCOPY      Current Medications: Current Meds  Medication Sig   acetaminophen (TYLENOL) 650 MG CR tablet Take 650 mg by mouth every 8 (eight) hours as needed for pain.   allopurinol (ZYLOPRIM) 300 MG tablet TAKE 1 TABLET BY MOUTH DAILY TO PREVENT GOUT   apixaban (ELIQUIS) 5 MG TABS  tablet Take 1 tablet (5 mg total) by mouth 2 (two) times daily.   cholecalciferol (VITAMIN D) 1000 UNITS tablet Take 5,000 Units by mouth every other day.    citalopram (CELEXA) 20 MG tablet TAKE 1 TABLET (20 MG TOTAL) BY MOUTH DAILY.   diltiazem (CARDIZEM CD) 360 MG 24 hr capsule TAKE 1 CAPSULE (360 MG TOTAL) BY MOUTH DAILY.   furosemide (LASIX) 20 MG tablet TAKE 1 TABLET (20 MG TOTAL) BY MOUTH DAILY.   Lancets (FREESTYLE) lancets    letrozole (FEMARA) 2.5 MG tablet Take 1 tablet (2.5 mg total) by mouth daily.   losartan (COZAAR) 50 MG tablet TAKE 1 TABLET (50 MG TOTAL) BY MOUTH DAILY.   pravastatin (PRAVACHOL) 40 MG tablet TAKE 1 TABLET BY MOUTH AT BEDTIME FOR CHOLESTEROL   Simethicone (GAS-X PO) Take 2 tablets by mouth daily as needed (bloating).     Allergies:   Buspar [buspirone], Lipitor [atorvastatin], Metformin and related, Metformin hcl, Nsaids, Sulfa antibiotics, Sulfacetamide sodium-sulfur, and Sulfonamide derivatives   Social History   Socioeconomic History   Marital status: Widowed    Spouse name: Not on file   Number of children: Not on file   Years of education: Not on file   Highest education level: Not on file  Occupational History   Occupation: retired  Tobacco Use   Smoking status: Former  Packs/day: 0.50    Years: 25.00    Total pack years: 12.50    Types: Cigarettes    Quit date: 07/30/2006    Years since quitting: 15.6   Smokeless tobacco: Never   Tobacco comments:    Former smoker 07/04/2021  Substance and Sexual Activity   Alcohol use: Not Currently    Alcohol/week: 2.0 standard drinks of alcohol    Types: 2 Standard drinks or equivalent per week    Comment: occasional, none in 3 months   Drug use: Never   Sexual activity: Not on file  Other Topics Concern   Not on file  Social History Narrative   Not on file   Social Determinants of Health   Financial Resource Strain: Low Risk  (05/24/2021)   Overall Financial Resource Strain (CARDIA)     Difficulty of Paying Living Expenses: Not very hard  Food Insecurity: No Food Insecurity (05/24/2021)   Hunger Vital Sign    Worried About Running Out of Food in the Last Year: Never true    Ran Out of Food in the Last Year: Never true  Transportation Needs: No Transportation Needs (05/24/2021)   PRAPARE - Hydrologist (Medical): No    Lack of Transportation (Non-Medical): No  Physical Activity: Not on file  Stress: Not on file  Social Connections: Not on file     Family History: The patient's family history includes CVA in her father; Diabetes in her mother; Hypertension in her father.  ROS:   Please see the history of present illness.  All other systems reviewed and are negative.  EKGs/Labs/Other Studies Reviewed:    The following studies were reviewed today:   EKG:   02/28/21:NSR, rate 67, No ST abnormalities 11/28/2020: Normal Sinus Rhythm. Rate 62 bpm. No ST abnormalities   Recent Labs: 05/24/2021: ALT 15 08/03/2021: Hemoglobin 13.7; Platelets 192 09/05/2021: BUN 21; Creatinine, Ser 0.86; Magnesium 2.3; Potassium 4.4; Sodium 144  Recent Lipid Panel    Component Value Date/Time   CHOL 179 09/02/2020 1035   TRIG 142 09/02/2020 1035   HDL 56 09/02/2020 1035   CHOLHDL 3.2 09/02/2020 1035   VLDL 28 01/14/2017 1114   LDLCALC 99 09/02/2020 1035    Physical Exam:    VS:  BP 138/60   Pulse 73   Ht '5\' 5"'$  (1.651 m)   Wt 145 lb 12.8 oz (66.1 kg)   SpO2 97%   BMI 24.26 kg/m     Wt Readings from Last 3 Encounters:  03/05/22 145 lb 12.8 oz (66.1 kg)  02/09/22 147 lb 9.6 oz (67 kg)  01/08/22 150 lb 12.8 oz (68.4 kg)     GEN: Well nourished, well developed in no acute distress HEENT: Normal NECK: No JVD; No carotid bruits LYMPHATICS: No lymphadenopathy CARDIAC: RRR, no murmurs, rubs, gallops RESPIRATORY:  Clear to auscultation without rales, wheezing or rhonchi  ABDOMEN: Soft, non-tender, non-distended MUSCULOSKELETAL:  compression  stockings in place SKIN: Warm and dry NEUROLOGIC:  Alert and oriented x 3 PSYCHIATRIC:  Normal affect   ASSESSMENT:    1. Paroxysmal atrial fibrillation (HCC)   2. Chronic diastolic heart failure ([place])   3. Essential hypertension   4. Hyperlipidemia, unspecified hyperlipidemia type     PLAN:    Atrial fibrillation: Diagnosed during admission with gastroenteritis 10/2020.  CHA2DS2-VASc score 5 (hypertension, age x2, diabetes, female).  Zio patch x2 weeks on 12/22/2020 showed AF burden 4%, average rate 150, with longest episode lasting 2 hours  46 minutes.  Echocardiogram 12/28/2020 showed normal biventricular function, no significant valvular disease. -Continue diltiazem 360 mg daily -Continue Eliquis 5 mg twice daily.  Check CBC  Chronic diastolic heart failure: On Lasix 20 mg daily.  Check BMET/magnesium  Hypertension: Currently on diltiazem 360 mg daily and losartan 50 mg daily.  Appears controlled  Hyperlipidemia: On pravastatin 40 mg daily.  LDL 99 on 09/02/2020  Hypokalemia: Potassium 2.6 on 11/18/2020.  Likely due to vomiting and diarrhea in the setting of Lasix use.  Lasix discontinued but was restarted, potassium has since been normal.  Will recheck BMET as above  T2DM: A1c down to 5.8% in 03/2021.  Currently diet controlled.   RTC in 6 months   Medication Adjustments/Labs and Tests Ordered: Current medicines are reviewed at length with the patient today.  Concerns regarding medicines are outlined above.  Orders Placed This Encounter  Procedures   Basic metabolic panel   Magnesium   CBC    No orders of the defined types were placed in this encounter.    Patient Instructions  Medication Instructions:  Your physician recommends that you continue on your current medications as directed. Please refer to the Current Medication list given to you today.  *If you need a refill on your cardiac medications before your next appointment, please call your pharmacy*   Lab  Work: BMET, CBC, Mag today  If you have labs (blood work) drawn today and your tests are completely normal, you will receive your results only by: [place] (if you have MyChart) OR A paper copy in the mail If you have any lab test that is abnormal or we need to change your treatment, we will call you to review the results.   Follow-Up: At [facility], you and your health needs are our priority.  As part of our continuing mission to provide you with exceptional heart care, we have created designated Provider Care Teams.  These Care Teams include your primary Cardiologist (physician) and Advanced Practice Providers (APPs -  Physician Assistants and Nurse Practitioners) who all work together to provide you with the care you need, when you need it.  We recommend signing up for the patient portal called "MyChart".  Sign up information is provided on this After Visit Summary.  MyChart is used to connect with patients for Virtual Visits (Telemedicine).  Patients are able to view lab/test results, encounter notes, upcoming appointments, etc.  Non-urgent messages can be sent to your provider as well.   To learn more about what you can do with MyChart, go to [email or web address].    Your next appointment:   6 month(s)  The format for your next appointment:   In Person  Provider:   Donato Heinz, MD {    Important Information About Sugar          Signed, Donato Heinz, MD  03/05/2022 5:12 PM    [place]

## 2022-03-05 ENCOUNTER — Encounter: Payer: Self-pay | Admitting: Cardiology

## 2022-03-05 ENCOUNTER — Ambulatory Visit (INDEPENDENT_AMBULATORY_CARE_PROVIDER_SITE_OTHER): Payer: Medicare Other | Admitting: Cardiology

## 2022-03-05 VITALS — BP 138/60 | HR 73 | Ht 65.0 in | Wt 145.8 lb

## 2022-03-05 DIAGNOSIS — E785 Hyperlipidemia, unspecified: Secondary | ICD-10-CM | POA: Diagnosis not present

## 2022-03-05 DIAGNOSIS — I10 Essential (primary) hypertension: Secondary | ICD-10-CM | POA: Diagnosis not present

## 2022-03-05 DIAGNOSIS — I5032 Chronic diastolic (congestive) heart failure: Secondary | ICD-10-CM | POA: Diagnosis not present

## 2022-03-05 DIAGNOSIS — I480 Paroxysmal atrial fibrillation: Secondary | ICD-10-CM | POA: Diagnosis not present

## 2022-03-05 NOTE — Patient Instructions (Signed)
Medication Instructions:  Your physician recommends that you continue on your current medications as directed. Please refer to the Current Medication list given to you today.  *If you need a refill on your cardiac medications before your next appointment, please call your pharmacy*   Lab Work: BMET, CBC, Mag today  If you have labs (blood work) drawn today and your tests are completely normal, you will receive your results only by: [place] (if you have MyChart) OR A paper copy in the mail If you have any lab test that is abnormal or we need to change your treatment, we will call you to review the results.   Follow-Up: At [facility], you and your health needs are our priority.  As part of our continuing mission to provide you with exceptional heart care, we have created designated Provider Care Teams.  These Care Teams include your primary Cardiologist (physician) and Advanced Practice Providers (APPs -  Physician Assistants and Nurse Practitioners) who all work together to provide you with the care you need, when you need it.  We recommend signing up for the patient portal called "MyChart".  Sign up information is provided on this After Visit Summary.  MyChart is used to connect with patients for Virtual Visits (Telemedicine).  Patients are able to view lab/test results, encounter notes, upcoming appointments, etc.  Non-urgent messages can be sent to your provider as well.   To learn more about what you can do with MyChart, go to [email or web address].    Your next appointment:   6 month(s)  The format for your next appointment:   In Person  Provider:   Donato Heinz, MD {    Important Information About Sugar

## 2022-03-06 ENCOUNTER — Encounter: Payer: Self-pay | Admitting: *Deleted

## 2022-03-06 LAB — CBC
Hematocrit: 41.7 % (ref 34.0–46.6)
Hemoglobin: 14.1 g/dL (ref 11.1–15.9)
MCH: 32.9 pg (ref 26.6–33.0)
MCHC: 33.8 g/dL (ref 31.5–35.7)
MCV: 97 fL (ref 79–97)
Platelets: 178 10*3/uL (ref 150–450)
RBC: 4.29 x10E6/uL (ref 3.77–5.28)
RDW: 12.9 % (ref 11.7–15.4)
WBC: 7.2 10*3/uL (ref 3.4–10.8)

## 2022-03-06 LAB — BASIC METABOLIC PANEL
BUN/Creatinine Ratio: 18 (ref 12–28)
BUN: 16 mg/dL (ref 8–27)
CO2: 27 mmol/L (ref 20–29)
Calcium: 9.8 mg/dL (ref 8.7–10.3)
Chloride: 100 mmol/L (ref 96–106)
Creatinine, Ser: 0.89 mg/dL (ref 0.57–1.00)
Glucose: 99 mg/dL (ref 70–99)
Potassium: 4.5 mmol/L (ref 3.5–5.2)
Sodium: 139 mmol/L (ref 134–144)
eGFR: 63 mL/min/{1.73_m2} (ref >59)

## 2022-03-06 LAB — MAGNESIUM: Magnesium: 2.3 mg/dL (ref 1.6–2.3)

## 2022-03-21 ENCOUNTER — Other Ambulatory Visit: Payer: Self-pay | Admitting: Internal Medicine

## 2022-03-21 DIAGNOSIS — E1169 Type 2 diabetes mellitus with other specified complication: Secondary | ICD-10-CM

## 2022-04-23 ENCOUNTER — Other Ambulatory Visit: Payer: Self-pay | Admitting: Cardiology

## 2022-04-30 ENCOUNTER — Ambulatory Visit: Payer: Self-pay

## 2022-04-30 NOTE — Telephone Encounter (Signed)
Advised on VM no sooner apts available at this time. Keep apt on Friday.

## 2022-04-30 NOTE — Telephone Encounter (Addendum)
Chief Complaint: area under left ear lode tender to touch Symptoms: mild pain with touching it , small bruise to left jaw  Frequency: Sat am  Pertinent Negatives: Patient denies  headache, fever, chest pain, difficulty breathing, neck swelling, radiating left arm or chest pain Disposition: '[]'$ ED /'[]'$ Urgent Care (no appt availability in office) / '[x]'$ Appointment(In office/virtual)/ '[]'$  [facility]/ '[]'$ Home Care/ '[]'$ Refused Recommended Disposition /'[]'$ [facility]/ '[]'$  Follow-up with PCP Additional Notes: pt wanted a sooner appt. Will place on wait list. No available appt this week. Pt refused [facility] , stated she may go to [facility]. Pt requested a rx for allergies to see if that will help.  Reason for Disposition  [1] Very tender to the touch AND [2] no fever  Answer Assessment - Initial Assessment Questions 1. ONSET: "When did the pain begin?"      Sat am  2. LOCATION: "Where does it hurt?"      Below left ear 3. PATTERN "Does the pain come and go, or has it been constant since it started?"      With touching 4. SEVERITY: "How bad is the pain?"  (Scale 1-10; or mild, moderate, severe)   - NO PAIN (0): no pain or only slight stiffness    - MILD (1-3): doesn't interfere with normal activities    - MODERATE (4-7): interferes with normal activities or awakens from sleep    - SEVERE (8-10):  excruciating pain, unable to do any normal activities      mild 5. RADIATION: "Does the pain go anywhere else, shoot into your arms?"     no 6. CORD SYMPTOMS: "Any weakness or numbness of the arms or legs?"     no 7. CAUSE: "What do you think is causing the neck pain?"     Swollen lymph node 8. NECK OVERUSE: "Any recent activities that involved turning or twisting the neck?"     no 9. OTHER SYMPTOMS: "Do you have any other symptoms?" (e.g., headache, fever, chest pain, difficulty breathing, neck swelling)     Bruise left jaw 10. PREGNANCY: "Is there any chance you are pregnant?" "When  was your last menstrual period?"       N/a  Answer Assessment - Initial Assessment Questions 1. LOCATION: "Where is the swollen node located?" "Is the matching node on the other side of the body also swollen?"      *No Answer* 2. SIZE: "How big is the node?" (e.g., inches or centimeters; or compared to common objects such as pea, bean, marble, golf ball)      *No Answer* 3. ONSET: "When did the swelling start?"      *No Answer* 4. NECK NODES: "Is there a sore throat, runny nose or other symptoms of a cold?"      *No Answer* 5. GROIN OR ARMPIT NODES: "Is there a sore, scratch, cut or painful red area on that arm or leg?"      *No Answer* 6. FEVER: "Do you have a fever?" If Yes, ask: "What is it, how was it measured, and when did it start?"      *No Answer* 7. CAUSE: "What do you think is causing the swollen lymph nodes?"     *No Answer* 8. OTHER SYMPTOMS: "Do you have any other symptoms?"     *No Answer* 9. PREGNANCY: "Is there any chance you are pregnant?" "When was your last menstrual period?"     *No Answer*  Protocols used: Neck Pain or Stiffness-A-AH, Lymph  Nodes - Swollen-A-AH

## 2022-05-03 ENCOUNTER — Other Ambulatory Visit: Payer: Self-pay | Admitting: Internal Medicine

## 2022-05-04 ENCOUNTER — Encounter: Payer: Self-pay | Admitting: Internal Medicine

## 2022-05-04 ENCOUNTER — Ambulatory Visit: Payer: Medicare Other | Attending: Internal Medicine | Admitting: Internal Medicine

## 2022-05-04 VITALS — BP 155/80 | HR 71 | Ht 65.0 in | Wt 146.0 lb

## 2022-05-04 DIAGNOSIS — R58 Hemorrhage, not elsewhere classified: Secondary | ICD-10-CM

## 2022-05-04 DIAGNOSIS — Z23 Encounter for immunization: Secondary | ICD-10-CM

## 2022-05-04 DIAGNOSIS — M25561 Pain in right knee: Secondary | ICD-10-CM

## 2022-05-04 DIAGNOSIS — H9203 Otalgia, bilateral: Secondary | ICD-10-CM | POA: Diagnosis not present

## 2022-05-04 DIAGNOSIS — E1159 Type 2 diabetes mellitus with other circulatory complications: Secondary | ICD-10-CM | POA: Diagnosis not present

## 2022-05-04 DIAGNOSIS — I480 Paroxysmal atrial fibrillation: Secondary | ICD-10-CM

## 2022-05-04 DIAGNOSIS — E1140 Type 2 diabetes mellitus with diabetic neuropathy, unspecified: Secondary | ICD-10-CM

## 2022-05-04 DIAGNOSIS — I152 Hypertension secondary to endocrine disorders: Secondary | ICD-10-CM

## 2022-05-04 NOTE — Progress Notes (Signed)
Patient ID: AYN DOMANGUE, female    DOB: November 13, 1934  MRN: [id number]  CC: Hypertension and Diabetes   Subjective: Ruth Delgado is a 87 y.o. female who presents for chronic ds Her concerns today include:  Pt with hx of HTN, HL, PAF on anticoagulation, chronic diastolic CHF, DM type 2, GERD, vit D def, DVT RT leg, gout, depression, gout,  DCIS RT breast ER/PR +  HTN/PAF/diastolic CHF:  Saw her cardiologist Dr. Gardiner Rhyme 03/05/2022.  No changes made in medications.  She continues on losartan 50 mg, Cardizem 360 mg, Furosemide 20 mg. Checks BP 2-3x/wk with recent readings being 116/70 and 124/79.  Blood pressure elevated today.  She feels she always has a little bit of whitecoat hypertension.  Blood pressure when she saw her cardiologist was 138/60. Took meds already today. No palpitations. She does get some bruising on the arm intermittently being on Eliquis.  She shows me pictures on her phone of increased bruising that she had on the arms last week but it has gotten better.  Daughter told her to ask me about whether it is okay for her to take Media.  Daughter says she repeats self sometimes.  Patient does not feel she has any significant issues with memory.  She still drives and is functional with her activities of daily living including doing her own grocery shopping and cooking.  No issues with forgetting to leave the stove or water on.  DM:  checking BS 2-3x/wk.  Range 119-130 She does well with her eating habits.  She cooks her own meals.  She stays active by working in her yard. Due for eye exam. Will schedule herself  RT knee hurts off and on x 2 mths.  It comes on suddenly and goes away after she uses a heating pad on it for several minutes.  She has not had any swelling.  No injury to the knee.   Complain of pain on the left side of the neck that started about 6 days ago.  A few days after that it went to the left ear.  She tried to get in for an urgent care visit but could  not so she was seen at an urgent care facility close to her house.  Diagnosed with ear infection.  Placed on amoxicillin 500 mg 3 times daily which she is still taking.  It has helped.  She feels that the right ear is beginning to hurt.  She does not use Q-tips in the ears.     Patient Active Problem List   Diagnosis Date Noted   Hyperglycemia due to type 2 diabetes mellitus ([place]) 01/02/2022   Hypercholesterolemia 01/02/2022   History of shingles 01/02/2022   Essential hypertension 05/24/2021   Intraductal carcinoma in situ of right breast 05/23/2021   Ductal carcinoma in situ (DCIS) of right breast 05/19/2021   Paroxysmal atrial fibrillation ([place]) 12/09/2020   Secondary hypercoagulable state ([place]) 12/09/2020   Atrial fibrillation with RVR ([place]) 11/15/2020   Nausea, vomiting and diarrhea 11/15/2020   Chronic atrial fibrillation ([place]) 11/15/2020   CKD stage 2 due to type 2 diabetes mellitus ([place]) 03/06/2018   Overweight (BMI 25.0-29.9) 03/06/2018   Osteopenia 03/06/2018   Esophageal reflux 09/06/2015   Vitamin D deficiency 06/08/2013   Medication management 06/08/2013   Hypertension    Hyperlipemia    Gout    Type 2 diabetes mellitus with stage 3 chronic kidney disease, without long-term current use of insulin ([place])  Current Outpatient Medications on File Prior to Visit  Medication Sig Dispense Refill   acetaminophen (TYLENOL) 650 MG CR tablet Take 650 mg by mouth every 8 (eight) hours as needed for pain.     allopurinol (ZYLOPRIM) 300 MG tablet TAKE 1 TABLET BY MOUTH DAILY TO PREVENT GOUT 90 tablet 1   apixaban (ELIQUIS) 5 MG TABS tablet Take 1 tablet (5 mg total) by mouth 2 (two) times daily. 60 tablet 3   cholecalciferol (VITAMIN D) 1000 UNITS tablet Take 5,000 Units by mouth every other day.      citalopram (CELEXA) 20 MG tablet TAKE 1 TABLET (20 MG TOTAL) BY MOUTH DAILY. 90 tablet 0   diltiazem (CARDIZEM CD) 360 MG 24 hr capsule TAKE 1 CAPSULE (360 MG TOTAL) BY MOUTH DAILY.  30 capsule 7   furosemide (LASIX) 20 MG tablet TAKE 1 TABLET (20 MG TOTAL) BY MOUTH DAILY. 30 tablet 6   Lancets (FREESTYLE) lancets   12   letrozole (FEMARA) 2.5 MG tablet Take 1 tablet (2.5 mg total) by mouth daily. 90 tablet 3   pravastatin (PRAVACHOL) 40 MG tablet TAKE 1 TABLET BY MOUTH AT BEDTIME FOR CHOLESTEROL MUST KEEP UPCOMING OFFICE VISIT FOR REFILLS 30 tablet 1   Simethicone (GAS-X PO) Take 2 tablets by mouth daily as needed (bloating).     losartan (COZAAR) 50 MG tablet TAKE 1 TABLET (50 MG TOTAL) BY MOUTH DAILY. 90 tablet 2   No current facility-administered medications on file prior to visit.    Allergies  Allergen Reactions   Buspar [Buspirone]     dysphoria   Lipitor [Atorvastatin]     Myalgias   Metformin And Related     Gas/bloating   Metformin Hcl Other (See Comments)   Nsaids     GI upset   Sulfa Antibiotics Other (See Comments)    Reaction=throat itching   Sulfacetamide Sodium-Sulfur Other (See Comments)   Sulfonamide Derivatives     Social History   Socioeconomic History   Marital status: Widowed    Spouse name: Not on file   Number of children: Not on file   Years of education: Not on file   Highest education level: Not on file  Occupational History   Occupation: retired  Tobacco Use   Smoking status: Former    Packs/day: 0.50    Years: 25.00    Total pack years: 12.50    Types: Cigarettes    Quit date: 07/30/2006    Years since quitting: 15.7   Smokeless tobacco: Never   Tobacco comments:    Former smoker 07/04/2021  Substance and Sexual Activity   Alcohol use: Not Currently    Alcohol/week: 2.0 standard drinks of alcohol    Types: 2 Standard drinks or equivalent per week    Comment: occasional, none in 3 months   Drug use: Never   Sexual activity: Not on file  Other Topics Concern   Not on file  Social History Narrative   Not on file   Social Determinants of Health   Financial Resource Strain: Low Risk  (05/24/2021)   Overall  Financial Resource Strain (CARDIA)    Difficulty of Paying Living Expenses: Not very hard  Food Insecurity: No Food Insecurity (05/24/2021)   Hunger Vital Sign    Worried About Running Out of Food in the Last Year: Never true    [place] in the Last Year: Never true  Transportation Needs: No Transportation Needs (05/24/2021)   [place] - Transportation  Lack of Transportation (Medical): No    Lack of Transportation (Non-Medical): No  Physical Activity: Not on file  Stress: Not on file  Social Connections: Not on file  Intimate Partner Violence: Not on file    Family History  Problem Relation Age of Onset   Diabetes Mother        suicide at age 15   Hypertension Father    CVA Father     Past Surgical History:  Procedure Laterality Date   ABDOMINAL HYSTERECTOMY     BIOPSY BREAST     (L) BREAST IN 1993   KNEE ARTHROSCOPY      ROS: Review of Systems Negative except as stated above  PHYSICAL EXAM: BP (!) 155/80   Pulse 71   Ht '5\' 5"'$  (1.651 m)   Wt 146 lb (66.2 kg)   SpO2 97%   BMI 24.30 kg/m   Physical Exam  General appearance - alert, well appearing, pleasant elderly female and in no distress Mental status - normal mood, behavior, speech, dress, motor activity, and thought processes Ears -dull light reflex bilaterally.  No wax buildup.  No erythema. Neck - supple, no significant adenopathy Chest - clear to auscultation, no wheezes, rales or rhonchi, symmetric air entry Heart - normal rate, regular rhythm, normal S1, S2, no murmurs, rubs, clicks or gallops Musculoskeletal -right knee: No point tenderness.  Good range of motion.  She ambulates unassisted. Extremities -no lower extremity edema. Skin: Few scattered small resolving areas of ecchymosis     Latest Ref Rng & Units 03/05/2022    2:14 PM 09/05/2021    3:31 PM 05/24/2021    8:08 AM  CMP  Glucose 70 - 99 mg/dL 99  98  118   BUN 8 - 27 mg/dL '16  21  18   '$ Creatinine 0.57 - 1.00 mg/dL 0.89  0.86  0.86    Sodium 134 - 144 mmol/L 139  144  141   Potassium 3.5 - 5.2 mmol/L 4.5  4.4  4.5   Chloride 96 - 106 mmol/L 100  103  102   CO2 20 - 29 mmol/L '27  22  27   '$ Calcium 8.7 - 10.3 mg/dL 9.8  9.5  9.5   Total Protein 6.5 - 8.1 g/dL   7.3   Total Bilirubin 0.3 - 1.2 mg/dL   0.6   Alkaline Phos 38 - 126 U/L   54   AST 15 - 41 U/L   20   ALT 0 - 44 U/L   15    Lipid Panel     Component Value Date/Time   CHOL 179 09/02/2020 1035   TRIG 142 09/02/2020 1035   HDL 56 09/02/2020 1035   CHOLHDL 3.2 09/02/2020 1035   VLDL 28 01/14/2017 1114   LDLCALC 99 09/02/2020 1035    CBC    Component Value Date/Time   WBC 7.2 03/05/2022 1414   WBC 6.9 05/24/2021 0808   WBC 7.4 12/01/2020 1615   RBC 4.29 03/05/2022 1414   RBC 4.06 05/24/2021 0808   HGB 14.1 03/05/2022 1414   HCT 41.7 03/05/2022 1414   PLT 178 03/05/2022 1414   MCV 97 03/05/2022 1414   MCH 32.9 03/05/2022 1414   MCH 33.0 05/24/2021 0808   MCHC 33.8 03/05/2022 1414   MCHC 33.8 05/24/2021 0808   RDW 12.9 03/05/2022 1414   LYMPHSABS 2.4 05/24/2021 0808   MONOABS 0.5 05/24/2021 0808   EOSABS 0.1 05/24/2021 0808   BASOSABS 0.1 05/24/2021  0808   Lab Results  Component Value Date   HGBA1C 6.1 (A) 01/02/2022    ASSESSMENT AND PLAN: 1. Controlled type 2 diabetes with neuropathy (HCC) Diet control.  Encouraged her to continue healthy eating habits and regular exercise. She will schedule her eye appointment herself.  2. Hypertension associated with diabetes ([place]) Not at goal today.  However she is consistent with checking blood pressure at home and readings have been good.  She will continue diltiazem 360 mg daily, Cozaar 50 mg daily and furosemide 20 mg daily.  3. Right knee pain, unspecified chronicity Since this, and goes very quickly, we will observe for now.  Advised that she can use some Voltaren gel as needed which can be purchased over-the-counter.  4. Ear pain, bilateral Reports improvement since being started on  antibiotics.  She will complete the current course of antibiotics.  5. PAF (paroxysmal atrial fibrillation) (HCC) On Eliquis and diltiazem.  6. Ecchymosis Likely due to being on Eliquis.  We will check a CBC. - CBC; Future  7. Need for influenza vaccination Given today.     Patient was given the opportunity to ask questions.  Patient verbalized understanding of the plan and was able to repeat key elements of the plan.   This documentation was completed using Radio producer.  Any transcriptional errors are unintentional.  No orders of the defined types were placed in this encounter.    Requested Prescriptions    No prescriptions requested or ordered in this encounter    No follow-ups on file.  Karle Plumber, MD, FACP

## 2022-05-11 ENCOUNTER — Other Ambulatory Visit: Payer: Self-pay | Admitting: Internal Medicine

## 2022-05-12 ENCOUNTER — Other Ambulatory Visit: Payer: Self-pay | Admitting: Hematology and Oncology

## 2022-05-17 ENCOUNTER — Encounter: Payer: Self-pay | Admitting: Hematology and Oncology

## 2022-05-18 ENCOUNTER — Ambulatory Visit: Payer: Medicare Other | Attending: Internal Medicine | Admitting: Internal Medicine

## 2022-05-18 DIAGNOSIS — Z0000 Encounter for general adult medical examination without abnormal findings: Secondary | ICD-10-CM | POA: Diagnosis not present

## 2022-05-18 NOTE — Progress Notes (Signed)
Subjective:   Ruth Delgado is a 87 y.o. female who presents for Medicare Annual (Subsequent) preventive examination.  I connected with  Ruth Delgado on 05/23/22 by a audio enabled telemedicine application and verified that I am speaking with the correct person using two identifiers.  Patient Location: Home  Provider Location: Home Office  I discussed the limitations of evaluation and management by telemedicine. The patient expressed understanding and agreed to proceed.   Nutrition Risk Assessment:  Has the patient had any N/V/D within the last 2 months?  No  Does the patient have any non-healing wounds?  No  Has the patient had any unintentional weight loss or weight gain?  No   Diabetes:  Is the patient diabetic?  Yes  If diabetic, was a CBG obtained today?  Yes  Did the patient bring in their glucometer from home?   How often do you monitor your CBG's? Twice monthly.   Financial Strains and Diabetes Management:  Are you having any financial strains with the device, your supplies or your medication? No .  Does the patient want to be seen by Chronic Care Management for management of their diabetes?  No  Would the patient like to be referred to a Nutritionist or for Diabetic Management?  No   Diabetic Exams:  Diabetic Eye Exam: Completed 04/13/2021 Diabetic Foot Exam: Overdue, Pt has been advised about the importance in completing this exam. Pt is scheduled for diabetic foot exam on January, 2024.      Objective:    There were no vitals filed for this visit. There is no height or weight on file to calculate BMI.     05/18/2022   11:06 AM 02/09/2022   11:27 AM 08/30/2021    1:32 PM 11/15/2020    4:59 PM 05/30/2020   12:14 PM 04/08/2019   11:53 AM 04/08/2019   11:43 AM  Advanced Directives  Does Patient Have a Medical Advance Directive? Yes Yes Yes No Yes No;Yes Yes  Type of Corporate treasurer of [employer];Living will [place];Living will  [place];Living will [place];Living will [place];Living will  Does patient want to make changes to medical advance directive? Yes (Inpatient - patient requests chaplain consult to change a medical advance directive) No - Patient declined No - Patient declined  No - Patient declined No - Patient declined   Copy of [place] in Chart?  No - copy requested No - copy requested    No - copy requested  Would patient like information on creating a medical advance directive?    No - Patient declined No - Patient declined      Current Medications (verified) Outpatient Encounter Medications as of 05/18/2022  Medication Sig   acetaminophen (TYLENOL) 650 MG CR tablet Take 650 mg by mouth every 8 (eight) hours as needed for pain.   allopurinol (ZYLOPRIM) 300 MG tablet TAKE 1 TABLET BY MOUTH DAILY TO PREVENT GOUT   cholecalciferol (VITAMIN D) 1000 UNITS tablet Take 5,000 Units by mouth every other day.    citalopram (CELEXA) 20 MG tablet TAKE 1 TABLET (20 MG TOTAL) BY MOUTH DAILY.   diltiazem (CARDIZEM CD) 360 MG 24 hr capsule TAKE 1 CAPSULE (360 MG TOTAL) BY MOUTH DAILY.   ELIQUIS 5 MG TABS tablet TAKE 1 TABLET BY MOUTH 2 TIMES DAILY.   furosemide (LASIX) 20 MG tablet TAKE 1 TABLET (20 MG TOTAL) BY  MOUTH DAILY.   Lancets (FREESTYLE) lancets    letrozole (FEMARA) 2.5 MG tablet TAKE 1 TABLET (2.5 MG TOTAL) BY MOUTH DAILY.   pravastatin (PRAVACHOL) 40 MG tablet TAKE 1 TABLET BY MOUTH AT BEDTIME FOR CHOLESTEROL MUST KEEP UPCOMING OFFICE VISIT FOR REFILLS   Simethicone (GAS-X PO) Take 2 tablets by mouth daily as needed (bloating).   losartan (COZAAR) 50 MG tablet TAKE 1 TABLET (50 MG TOTAL) BY MOUTH DAILY.   No facility-administered encounter medications on file as of 05/18/2022.    Allergies (verified) Buspar [buspirone], Lipitor [atorvastatin], Metformin and related, Metformin hcl, Nsaids, Sulfa  antibiotics, Sulfacetamide sodium-sulfur, and Sulfonamide derivatives   History: Past Medical History:  Diagnosis Date   A-fib ([place])    Anxiety    Arthritis    Breast cancer ([place])    Diabetes mellitus    Gout    Hyperlipemia    Hypertension    Past Surgical History:  Procedure Laterality Date   ABDOMINAL HYSTERECTOMY     BIOPSY BREAST     (L) BREAST IN 1993   KNEE ARTHROSCOPY     Family History  Problem Relation Age of Onset   Diabetes Mother        suicide at age 87   Hypertension Father    CVA Father    Social History   Socioeconomic History   Marital status: Widowed    Spouse name: Not on file   Number of children: Not on file   Years of education: Not on file   Highest education level: Not on file  Occupational History   Occupation: retired  Tobacco Use   Smoking status: Former    Packs/day: 0.50    Years: 25.00    Total pack years: 12.50    Types: Cigarettes    Quit date: 07/30/2006    Years since quitting: 15.8   Smokeless tobacco: Never   Tobacco comments:    Former smoker 07/04/2021  Substance and Sexual Activity   Alcohol use: Not Currently    Alcohol/week: 2.0 standard drinks of alcohol    Types: 2 Standard drinks or equivalent per week    Comment: occasional, none in 3 months   Drug use: Never   Sexual activity: Not on file  Other Topics Concern   Not on file  Social History Narrative   Not on file   Social Determinants of Health   Financial Resource Strain: Low Risk  (05/24/2021)   Overall Financial Resource Strain (CARDIA)    Difficulty of Paying Living Expenses: Not very hard  Food Insecurity: No Food Insecurity (05/18/2022)   Hunger Vital Sign    Worried About Running Out of Food in the Last Year: Never true    [place] in the Last Year: Never true  Transportation Needs: No Transportation Needs (05/18/2022)   PRAPARE - Hydrologist (Medical): No    Lack of Transportation (Non-Medical): No   Physical Activity: Insufficiently Active (05/18/2022)   Exercise Vital Sign    Days of Exercise per Week: 4 days    Minutes of Exercise per Session: 30 min  Stress: No Stress Concern Present (05/18/2022)   [place]    Feeling of Stress : Not at all  Social Connections: Moderately Integrated (05/18/2022)   Social Connection and Isolation Panel [NHANES]    Frequency of Communication with Friends and Family: More than three times a week    Frequency of  Social Gatherings with Friends and Family: Twice a week    Attends Religious Services: 1 to 4 times per year    Active Member of [employer] or Organizations: Yes    Attends Archivist Meetings: More than 4 times per year    Marital Status: Widowed    Tobacco Counseling Counseling given: Not Answered Tobacco comments: Former smoker 07/04/2021   Clinical Intake:                 Diabetic?yes         Activities of Daily Living    05/18/2022   11:30 AM 02/09/2022   11:30 AM  In your present state of health, do you have any difficulty performing the following activities:  Hearing? 0 0  Comment  No hearing  Vision? 0 0  Comment  Dr. Einar Gip  Difficulty concentrating or making decisions? 0 0  Walking or climbing stairs? 0 0  Dressing or bathing? 0 0  Doing errands, shopping? 0 0  Preparing Food and eating ? N N  Using the Toilet? N N  In the past six months, have you accidently leaked urine? N N  Do you have problems with loss of bowel control? N N  Managing your Medications? N N  Managing your Finances? N N  Housekeeping or managing your Housekeeping? N N    Patient Care Team: Ladell Pier, MD as PCP - General (Internal Medicine) Donato Heinz, MD as PCP - Cardiology (Cardiology) Jacelyn Pi, MD as Consulting Physician (Endocrinology) Rockwell Germany, RN as Oncology Nurse Navigator Mauro Kaufmann, RN as Oncology  Nurse Navigator Donnie Mesa, MD as Consulting Physician (General Surgery) Nicholas Lose, MD as Consulting Physician (Hematology and Oncology) Eppie Gibson, MD as Attending Physician (Radiation Oncology)  Indicate any recent Medical Services you may have received from other than [facility] providers in the past year (date may be approximate).     Assessment:   This is a routine wellness examination for [place].  Hearing/Vision screen No results found.  Dietary issues and exercise activities discussed:     Goals Addressed   None   Depression Screen    05/18/2022   11:30 AM 02/09/2022   11:28 AM 01/02/2022    1:56 PM 09/04/2021    3:16 PM 04/13/2021   11:27 AM 01/05/2021    2:35 PM 11/30/2020   11:54 PM  PHQ 2/9 Scores  PHQ - 2 Score 0 0 0 0 0 1 0  PHQ- 9 Score   0        Fall Risk    05/18/2022   11:30 AM 05/04/2022   11:40 AM 02/09/2022   11:28 AM 09/04/2021    3:16 PM 08/03/2021    9:37 AM  Fall Risk   Falls in the past year? 0 0 0 0 0  Number falls in past yr: 0 0 0 0 0  Injury with Fall? 0 0 0 0 0  Risk for fall due to : No Fall Risks No Fall Risks  No Fall Risks No Fall Risks  Follow up Falls evaluation completed Falls evaluation completed Education provided;Falls evaluation completed;Falls prevention discussed      FALL RISK PREVENTION PERTAINING TO THE HOME:  Any stairs in or around the home? Yes  If so, are there any without handrails? No  Home free of loose throw rugs in walkways, pet beds, electrical cords, etc? Yes  Adequate lighting in your home to reduce risk of falls? Yes  ASSISTIVE DEVICES UTILIZED TO PREVENT FALLS:  Life alert? No Use of a cane, walker or w/c? No  Grab bars in the bathroom? No  Shower chair or bench in shower? No  Elevated toilet seat or a handicapped toilet? No     Cognitive Function:    02/09/2022   11:35 AM 05/30/2020   12:17 PM  MMSE - Mini Mental State Exam  Orientation to time 5 5  Orientation to Place 5 5  Registration 3 3   Attention/ Calculation 5 5  Recall 1 3  Language- name 2 objects 2 2  Language- repeat 1 1  Language- follow 3 step command 3 3  Language- read & follow direction 1 1  Write a sentence 1 1  Copy design 1 1  Total score 28 30        05/18/2022   11:08 AM  6CIT Screen  What Year? 0 points  What month? 0 points  What time? 0 points  Count back from 20 2 points  Months in reverse 0 points  Repeat phrase 0 points  Total Score 2 points    Immunizations Immunization History  Administered Date(s) Administered   Influenza Split 07/13/2014   Influenza, High Dose Seasonal PF 04/14/2015, 04/16/2017, 06/13/2018, 04/08/2019   Influenza, Quadrivalent, Recombinant, Inj, Pf 04/26/2020   Influenza,inj,Quad PF,6+ Mos 04/13/2021, 05/04/2022   Influenza-Unspecified 04/16/2016   PFIZER(Purple Top)SARS-COV-2 Vaccination 08/21/2019, 09/11/2019, 05/19/2020   Pneumococcal-Unspecified 06/07/2003   Td 06/02/2007, 11/12/2017   Zoster Recombinat (Shingrix) 09/12/2021, 12/14/2021   Zoster, Live 05/30/2012    TDAP status: Up to date  Flu Vaccine status: Up to date  Pneumococcal vaccine status: Up to date  Covid-19 vaccine status: Completed vaccines  Qualifies for Shingles Vaccine? Yes   Zostavax completed No   Shingrix Completed?: Yes  Screening Tests Health Maintenance  Topic Date Due   COVID-19 Vaccine (4 - Pfizer risk series) 07/14/2020   FOOT EXAM  02/09/2021   OPHTHALMOLOGY EXAM  04/13/2022   Pneumonia Vaccine 76+ Years old (1 - PCV) 08/03/2022 (Originally 02/27/2002)   HEMOGLOBIN A1C  07/04/2022   Diabetic kidney evaluation - Urine ACR  09/04/2022   MAMMOGRAM  11/08/2022   Diabetic kidney evaluation - GFR measurement  03/06/2023   Medicare Annual Wellness (AWV)  06/18/2023   TETANUS/TDAP  11/13/2027   INFLUENZA VACCINE  Completed   DEXA SCAN  Completed   Zoster Vaccines- Shingrix  Completed   HPV VACCINES  Aged Out    Health Maintenance  Health Maintenance Due  Topic  Date Due   COVID-19 Vaccine (4 - Pfizer risk series) 07/14/2020   FOOT EXAM  02/09/2021   OPHTHALMOLOGY EXAM  04/13/2022    Colorectal cancer screening: Referral to GI placed  . Pt aware the office will call re: appt.  Mammogram status: Completed 11/02/2021. Repeat every year  Bone Density results show osteoporosis. Dexa scan on 11/17/2021  Lung Cancer Screening: (Low Dose CT Chest recommended if Age 54-80 years, 30 pack-year currently smoking OR have quit w/in 15years.) does not qualify.   Lung Cancer Screening Referral:   Additional Screening:  Hepatitis C Screening: does qualify;no completed  Vision Screening: Recommended annual ophthalmology exams for early detection of glaucoma and other disorders of the eye. Is the patient up to date with their annual eye exam?  Yes  Who is the provider or what is the name of the office in which the patient attends annual eye exams? Dr. Einar Gip If pt is not established with a  provider, would they like to be referred to a provider to establish care? No .   Dental Screening: Recommended annual dental exams for proper oral hygiene  Community Resource Referral / Chronic Care Management: CRR required this visit?  No   CCM required this visit?  No      Plan:     I have personally reviewed and noted the following in the patient's chart:   Medical and social history Use of alcohol, tobacco or illicit drugs  Current medications and supplements including opioid prescriptions. Patient is not currently taking opioid prescriptions. Functional ability and status Nutritional status Physical activity Advanced directives List of other physicians Hospitalizations, surgeries, and ER visits in previous 12 months Vitals Screenings to include cognitive, depression, and falls Referrals and appointments  In addition, I have reviewed and discussed with patient certain preventive protocols, quality metrics, and best practice recommendations. A written  personalized care plan for preventive services as well as general preventive health recommendations were provided to patient.     Carilyn Goodpasture, RN   05/23/2022   Nurse Notes: Non-Face-to-Face 14 minutes   Ms. Suk , Thank you for taking time to come for your Medicare Wellness Visit. I appreciate your ongoing commitment to your health goals. Please review the following plan we discussed and let me know if I can assist you in the future.   These are the goals we discussed:  Goals   None     This is a list of the screening recommended for you and due dates:  Health Maintenance  Topic Date Due   COVID-19 Vaccine (4 - Pfizer risk series) 07/14/2020   Complete foot exam   02/09/2021   Eye exam for diabetics  04/13/2022   Pneumonia Vaccine (1 - PCV) 08/03/2022*   Hemoglobin A1C  07/04/2022   Yearly kidney health urinalysis for diabetes  09/04/2022   Mammogram  11/08/2022   Yearly kidney function blood test for diabetes  03/06/2023   Medicare Annual Wellness Visit  06/18/2023   Tetanus Vaccine  11/13/2027   Flu Shot  Completed   DEXA scan (bone density measurement)  Completed   Zoster (Shingles) Vaccine  Completed   HPV Vaccine  Aged Out  *Topic was postponed. The date shown is not the original due date.

## 2022-05-18 NOTE — Patient Instructions (Signed)
Ruth Delgado , Thank you for taking time to come for your Medicare Wellness Visit. I appreciate your ongoing commitment to your health goals. Please review the following plan we discussed and let me know if I can assist you in the future.   These are the goals we discussed:  Goals   None     This is a list of the screening recommended for you and due dates:  Health Maintenance  Topic Date Due   COVID-19 Vaccine (4 - Pfizer risk series) 07/14/2020   Complete foot exam   02/09/2021   Eye exam for diabetics  04/13/2022   Pneumonia Vaccine (1 - PCV) 08/03/2022*   Hemoglobin A1C  07/04/2022   Yearly kidney health urinalysis for diabetes  09/04/2022   Mammogram  11/08/2022   Yearly kidney function blood test for diabetes  03/06/2023   Medicare Annual Wellness Visit  06/18/2023   Tetanus Vaccine  11/13/2027   Flu Shot  Completed   DEXA scan (bone density measurement)  Completed   Zoster (Shingles) Vaccine  Completed   HPV Vaccine  Aged Out  *Topic was postponed. The date shown is not the original due date.        Health Maintenance, Female Adopting a healthy lifestyle and getting preventive care are important in promoting health and wellness. Ask your health care provider about: The right schedule for you to have regular tests and exams. Things you can do on your own to prevent diseases and keep yourself healthy. What should I know about diet, weight, and exercise? Eat a healthy diet  Eat a diet that includes plenty of vegetables, fruits, low-fat dairy products, and lean protein. Do not eat a lot of foods that are high in solid fats, added sugars, or sodium. Maintain a healthy weight Body mass index (BMI) is used to identify weight problems. It estimates body fat based on height and weight. Your health care provider can help determine your BMI and help you achieve or maintain a healthy weight. Get regular exercise Get regular exercise. This is one of the most important things you  can do for your health. Most adults should: Exercise for at least 150 minutes each week. The exercise should increase your heart rate and make you sweat (moderate-intensity exercise). Do strengthening exercises at least twice a week. This is in addition to the moderate-intensity exercise. Spend less time sitting. Even light physical activity can be beneficial. Watch cholesterol and blood lipids Have your blood tested for lipids and cholesterol at 85 years of age, then have this test every 5 years. Have your cholesterol levels checked more often if: Your lipid or cholesterol levels are high. You are older than 85 years of age. You are at high risk for heart disease. What should I know about cancer screening? Depending on your health history and family history, you may need to have cancer screening at various ages. This may include screening for: Breast cancer. Cervical cancer. Colorectal cancer. Skin cancer. Lung cancer. What should I know about heart disease, diabetes, and high blood pressure? Blood pressure and heart disease High blood pressure causes heart disease and increases the risk of stroke. This is more likely to develop in people who have high blood pressure readings or are overweight. Have your blood pressure checked: Every 3-5 years if you are 50-8 years of age. Every year if you are 34 years old or older. Diabetes Have regular diabetes screenings. This checks your fasting blood sugar level. Have the screening done:  Once every three years after age 8 if you are at a normal weight and have a low risk for diabetes. More often and at a younger age if you are overweight or have a high risk for diabetes. What should I know about preventing infection? Hepatitis B If you have a higher risk for hepatitis B, you should be screened for this virus. Talk with your health care provider to find out if you are at risk for hepatitis B infection. Hepatitis C Testing is recommended  for: Everyone born from [birth year] through 1963. Anyone with known risk factors for hepatitis C. Sexually transmitted infections (STIs) Get screened for STIs, including gonorrhea and chlamydia, if: You are sexually active and are younger than 85 years of age. You are older than 85 years of age and your health care provider tells you that you are at risk for this type of infection. Your sexual activity has changed since you were last screened, and you are at increased risk for chlamydia or gonorrhea. Ask your health care provider if you are at risk. Ask your health care provider about whether you are at high risk for HIV. Your health care provider may recommend a prescription medicine to help prevent HIV infection. If you choose to take medicine to prevent HIV, you should first get tested for HIV. You should then be tested every 3 months for as long as you are taking the medicine. Pregnancy If you are about to stop having your period (premenopausal) and you may become pregnant, seek counseling before you get pregnant. Take 400 to 800 micrograms (mcg) of folic acid every day if you become pregnant. Ask for birth control (contraception) if you want to prevent pregnancy. Osteoporosis and menopause Osteoporosis is a disease in which the bones lose minerals and strength with aging. This can result in bone fractures. If you are 61 years old or older, or if you are at risk for osteoporosis and fractures, ask your health care provider if you should: Be screened for bone loss. Take a calcium or vitamin D supplement to lower your risk of fractures. Be given hormone replacement therapy (HRT) to treat symptoms of menopause. Follow these instructions at home: Alcohol use Do not drink alcohol if: Your health care provider tells you not to drink. You are pregnant, may be pregnant, or are planning to become pregnant. If you drink alcohol: Limit how much you have to: 0-1 drink a day. Know how much alcohol is in  your drink. In the [place] one drink equals one 12 oz bottle of beer (355 mL), one 5 oz glass of wine (148 mL), or one 1 oz glass of hard liquor (44 mL). Lifestyle Do not use any products that contain nicotine or tobacco. These products include cigarettes, chewing tobacco, and vaping devices, such as e-cigarettes. If you need help quitting, ask your health care provider. Do not use street drugs. Do not share needles. Ask your health care provider for help if you need support or information about quitting drugs. You are due for Hepatitis C screening and a Colonoscopy. Ask you PCP about a need for low dose CT scan due  to previous smoking history.    General instructions Schedule regular health, dental, and eye exams. Stay current with your vaccines. Tell your health care provider if: You often feel depressed. You have ever been abused or do not feel safe at home. Summary Adopting a healthy lifestyle and getting preventive care are important in promoting health and wellness. Follow your  health care provider's instructions about healthy diet, exercising, and getting tested or screened for diseases. Follow your health care provider's instructions on monitoring your cholesterol and blood pressure. This information is not intended to replace advice given to you by your health care provider. Make sure you discuss any questions you have with your health care provider. Document Revised: 12/05/2020 Document Reviewed: 12/05/2020 Elsevier Patient Education  [place].

## 2022-05-23 NOTE — Progress Notes (Signed)
Patient Care Team: Ladell Pier, MD as PCP - General (Internal Medicine) Donato Heinz, MD as PCP - Cardiology (Cardiology) Jacelyn Pi, MD as Consulting Physician (Endocrinology) Rockwell Germany, RN as Oncology Nurse Navigator Mauro Kaufmann, RN as Oncology Nurse Navigator Donnie Mesa, MD as Consulting Physician (General Surgery) Nicholas Lose, MD as Consulting Physician (Hematology and Oncology) Eppie Gibson, MD as Attending Physician (Radiation Oncology)  DIAGNOSIS: No diagnosis found.  SUMMARY OF ONCOLOGIC HISTORY: Oncology History  Ductal carcinoma in situ (DCIS) of right breast  05/19/2021 Initial Diagnosis   Screening mammogram: indeterminate linear calcifications in the right breast. Diagnostic mammogram and [place]: 1 cm group of fine linear branching calcifications.  Stereotactic biopsy: Low-grade DCIS, ER 100%, PR 95%   05/24/2021 Cancer Staging   Staging form: Breast, AJCC 8th Edition - Clinical stage from 05/24/2021: Stage 0 (cTis (DCIS), cN0, cM0, G1, ER+, PR+, HER2: Not Assessed) - Signed by Nicholas Lose, MD on 05/24/2021 Histologic grading system: 3 grade system     CHIEF COMPLIANT: Follow-up right breast DCIS  INTERVAL HISTORY: Ruth Delgado is a 87 y.o. with above-mentioned history of right breast DCIS. She presents to the clinic for follow-up to discuss mammogram.    ALLERGIES:  is allergic to buspar [buspirone], lipitor [atorvastatin], metformin and related, metformin hcl, nsaids, sulfa antibiotics, sulfacetamide sodium-sulfur, and sulfonamide derivatives.  MEDICATIONS:  Current Outpatient Medications  Medication Sig Dispense Refill   acetaminophen (TYLENOL) 650 MG CR tablet Take 650 mg by mouth every 8 (eight) hours as needed for pain.     allopurinol (ZYLOPRIM) 300 MG tablet TAKE 1 TABLET BY MOUTH DAILY TO PREVENT GOUT 90 tablet 1   cholecalciferol (VITAMIN D) 1000 UNITS tablet Take 5,000 Units by mouth every other day.       citalopram (CELEXA) 20 MG tablet TAKE 1 TABLET (20 MG TOTAL) BY MOUTH DAILY. 90 tablet 0   diltiazem (CARDIZEM CD) 360 MG 24 hr capsule TAKE 1 CAPSULE (360 MG TOTAL) BY MOUTH DAILY. 30 capsule 7   ELIQUIS 5 MG TABS tablet TAKE 1 TABLET BY MOUTH 2 TIMES DAILY. 60 tablet 3   furosemide (LASIX) 20 MG tablet TAKE 1 TABLET (20 MG TOTAL) BY MOUTH DAILY. 30 tablet 6   Lancets (FREESTYLE) lancets   12   letrozole (FEMARA) 2.5 MG tablet TAKE 1 TABLET (2.5 MG TOTAL) BY MOUTH DAILY. 90 tablet 3   losartan (COZAAR) 50 MG tablet TAKE 1 TABLET (50 MG TOTAL) BY MOUTH DAILY. 90 tablet 2   pravastatin (PRAVACHOL) 40 MG tablet TAKE 1 TABLET BY MOUTH AT BEDTIME FOR CHOLESTEROL MUST KEEP UPCOMING OFFICE VISIT FOR REFILLS 30 tablet 1   Simethicone (GAS-X PO) Take 2 tablets by mouth daily as needed (bloating).     No current facility-administered medications for this visit.    PHYSICAL EXAMINATION: ECOG PERFORMANCE STATUS: {CHL ONC ECOG PS:[phone]}  There were no vitals filed for this visit. There were no vitals filed for this visit.  BREAST:*** No palpable masses or nodules in either right or left breasts. No palpable axillary supraclavicular or infraclavicular adenopathy no breast tenderness or nipple discharge. (exam performed in the presence of a chaperone)  LABORATORY DATA:  I have reviewed the data as listed    Latest Ref Rng & Units 03/05/2022    2:14 PM 09/05/2021    3:31 PM 05/24/2021    8:08 AM  CMP  Glucose 70 - 99 mg/dL 99  98  118   BUN 8 -  27 mg/dL 16  21  18    Creatinine 0.57 - 1.00 mg/dL 0.89  0.86  0.86   Sodium 134 - 144 mmol/L 139  144  141   Potassium 3.5 - 5.2 mmol/L 4.5  4.4  4.5   Chloride 96 - 106 mmol/L 100  103  102   CO2 20 - 29 mmol/L 27  22  27    Calcium 8.7 - 10.3 mg/dL 9.8  9.5  9.5   Total Protein 6.5 - 8.1 g/dL   7.3   Total Bilirubin 0.3 - 1.2 mg/dL   0.6   Alkaline Phos 38 - 126 U/L   54   AST 15 - 41 U/L   20   ALT 0 - 44 U/L   15     Lab Results  Component  Value Date   WBC 7.2 03/05/2022   HGB 14.1 03/05/2022   HCT 41.7 03/05/2022   MCV 97 03/05/2022   PLT 178 03/05/2022   NEUTROABS 3.9 05/24/2021    ASSESSMENT & PLAN:  No problem-specific Assessment & Plan notes found for this encounter.    No orders of the defined types were placed in this encounter.  The patient has a good understanding of the overall plan. she agrees with it. she will call with any problems that may develop before the next visit here. Total time spent: 30 mins including face to face time and time spent for planning, charting and co-ordination of care   Ruth Delgado, Ruth Delgado 05/23/22    I Gardiner Coins am scribing for Dr. Lindi Adie  ***

## 2022-05-24 ENCOUNTER — Inpatient Hospital Stay: Payer: Medicare Other | Attending: Hematology and Oncology | Admitting: Hematology and Oncology

## 2022-05-24 ENCOUNTER — Other Ambulatory Visit: Payer: Self-pay

## 2022-05-24 DIAGNOSIS — D0511 Intraductal carcinoma in situ of right breast: Secondary | ICD-10-CM | POA: Diagnosis not present

## 2022-05-24 DIAGNOSIS — Z79811 Long term (current) use of aromatase inhibitors: Secondary | ICD-10-CM | POA: Insufficient documentation

## 2022-05-24 DIAGNOSIS — Z86000 Personal history of in-situ neoplasm of breast: Secondary | ICD-10-CM | POA: Insufficient documentation

## 2022-05-24 NOTE — Assessment & Plan Note (Addendum)
October 2022:Screening mammogram: indeterminate linear calcifications in the right breast. Diagnostic mammogram and [place]: 1 cm group of fine linear branching calcifications. Stereotactic biopsy: Low-grade DCIS, ER 100%, PR 95%  Recommendation: 1.Neoadjuvant antiestrogen therapy with tamoxifen, switched to letrozole 2.patient did not wish to undergo surgery --------------------------------------------------------------------------------------------------------------------------------- Letrozole toxicities: 1. Sweating every morning: Resolved with taking letrozole at bedtime. No other side effects to stop Intermittent breast twinges: Benign  Breast cancer surveillance: 05/09/2022: Right mammogram: Stable biopsy site low-grade DCIS without residual calcifications. 05/24/2022: Breast exam: Benign She gets mammograms at [facility] every 6 months on the right breast. Return to clinic in 1 year for follow-up

## 2022-05-25 ENCOUNTER — Ambulatory Visit: Payer: Medicare Other | Admitting: Hematology and Oncology

## 2022-06-07 ENCOUNTER — Other Ambulatory Visit: Payer: Self-pay | Admitting: Physician Assistant

## 2022-06-08 ENCOUNTER — Other Ambulatory Visit: Payer: Self-pay | Admitting: Internal Medicine

## 2022-06-08 DIAGNOSIS — E1169 Type 2 diabetes mellitus with other specified complication: Secondary | ICD-10-CM

## 2022-06-23 ENCOUNTER — Other Ambulatory Visit: Payer: Self-pay | Admitting: Internal Medicine

## 2022-06-23 DIAGNOSIS — Z8739 Personal history of other diseases of the musculoskeletal system and connective tissue: Secondary | ICD-10-CM

## 2022-06-26 NOTE — Telephone Encounter (Signed)
Requested Prescriptions  Pending Prescriptions Disp Refills   allopurinol (ZYLOPRIM) 300 MG tablet [Pharmacy Med Name: ALLOPURINOL 300 MG TABS 300 Tablet] 90 tablet 0    Sig: TAKE 1 TABLET BY MOUTH DAILY TO PREVENT GOUT     Endocrinology:  Gout Agents - allopurinol Failed - 06/23/2022  9:05 AM      Failed - Uric Acid in normal range and within 360 days    Uric Acid, Serum  Date Value Ref Range Status  09/02/2020 3.8 2.5 - 7.0 mg/dL Final    Comment:    Therapeutic target for gout patients: <6.0 mg/dL .          Failed - CBC within normal limits and completed in the last 12 months    WBC  Date Value Ref Range Status  03/05/2022 7.2 3.4 - 10.8 x10E3/uL Final  12/01/2020 7.4 3.8 - 10.8 Thousand/uL Final   WBC Count  Date Value Ref Range Status  05/24/2021 6.9 4.0 - 10.5 K/uL Final   RBC  Date Value Ref Range Status  03/05/2022 4.29 3.77 - 5.28 x10E6/uL Final  05/24/2021 4.06 3.87 - 5.11 MIL/uL Final   Hemoglobin  Date Value Ref Range Status  03/05/2022 14.1 11.1 - 15.9 g/dL Final   Hematocrit  Date Value Ref Range Status  03/05/2022 41.7 34.0 - 46.6 % Final   MCHC  Date Value Ref Range Status  03/05/2022 33.8 31.5 - 35.7 g/dL Final  05/24/2021 33.8 30.0 - 36.0 g/dL Final   [facility]  Date Value Ref Range Status  03/05/2022 32.9 26.6 - 33.0 pg Final  05/24/2021 33.0 26.0 - 34.0 pg Final   MCV  Date Value Ref Range Status  03/05/2022 97 79 - 97 fL Final   No results found for: "PLTCOUNTKUC", "LABPLAT", "POCPLA" RDW  Date Value Ref Range Status  03/05/2022 12.9 11.7 - 15.4 % Final         Passed - Cr in normal range and within 360 days    Creatinine  Date Value Ref Range Status  05/24/2021 0.86 0.44 - 1.00 mg/dL Final   Creat  Date Value Ref Range Status  12/01/2020 0.80 0.60 - 0.88 mg/dL Final    Comment:    For patients >71 years of age, the reference limit for Creatinine is approximately 13% higher for people identified as African-American. .     Creatinine, Ser  Date Value Ref Range Status  03/05/2022 0.89 0.57 - 1.00 mg/dL Final   Creatinine, Urine  Date Value Ref Range Status  02/11/2020 104 20 - 275 mg/dL Final         Passed - Valid encounter within last 12 months    Recent Outpatient Visits           1 month ago Encounter for [employer] annual wellness exam   [place] Karle Plumber B, MD   1 month ago Controlled type 2 diabetes with neuropathy [facility])   [place], MD   4 months ago Encounter for [employer] annual wellness exam   [place], Jarome Matin, RPH-CPP   5 months ago Controlled type 2 diabetes with neuropathy [facility])   [place], MD   9 months ago Hypertension associated with diabetes [facility])   [place], MD       Future Appointments  In 2 months Donato Heinz, MD [place]. [place]   In 2 months Wynetta Emery, Dalbert Batman, MD [place]

## 2022-07-19 ENCOUNTER — Other Ambulatory Visit (HOSPITAL_COMMUNITY): Payer: Self-pay | Admitting: Physician Assistant

## 2022-07-25 ENCOUNTER — Other Ambulatory Visit: Payer: Self-pay | Admitting: Internal Medicine

## 2022-07-31 NOTE — Progress Notes (Signed)
Cardiology Office Note:    Date:  08/01/2022   ID:  Ruth Delgado, DOB [birth year], MRN [id number]  PCP:  Marcine Matar, MD  Cardiologist:  Little Ishikawa, MD  Electrophysiologist:  None   Referring MD: Marcine Matar, MD   No chief complaint on file.   History of Present Illness:    Ruth Delgado is a 88 y.o. female with a hx of atrial fibrillation, T2DM, hypertension, hyperlipidemia, gout who is referred by Dr. Oneta Rack for evaluation of atrial fibrillation.  She was admitted to [facility] from 4/19 through 11/18/2020.  She presented with sudden onset nausea/vomiting/diarrhea.  On the ED she went into A. fib with RVR and was started on diltiazem drip.  This was transitioned to p.o. diltiazem and she was started on Eliquis 5 mg twice daily.  Zio patch x2 weeks on 12/22/2020 showed AF burden 4%, average rate 150, with longest episode lasting 2 hours 46 minutes.  Echocardiogram 12/28/2020 showed normal biventricular function, no significant valvular disease.  Since last clinic visit, she reports she had a fall last night.  States that she tripped walking down the steps to her garage.  Landed on her left side, did not hit her head.  She denies any pain but states she feels sore today.  Denies any chest pain, dyspnea, lightheadedness, syncope, lower extremity edema.  Occasional palpitations, occurs about once every 2 weeks.  Denies any blood in stool or urine.  Reports has been having bruising on arms and legs.    Past Medical History:  Diagnosis Date   A-fib (HCC)    Anxiety    Arthritis    Breast cancer (HCC)    Diabetes mellitus    Gout    Hyperlipemia    Hypertension     Past Surgical History:  Procedure Laterality Date   ABDOMINAL HYSTERECTOMY     BIOPSY BREAST     (L) BREAST IN 1993   KNEE ARTHROSCOPY      Current Medications: Current Meds  Medication Sig   acetaminophen (TYLENOL) 650 MG CR tablet Take 650 mg by mouth every 8 (eight) hours as needed for pain.    allopurinol (ZYLOPRIM) 300 MG tablet TAKE 1 TABLET BY MOUTH DAILY TO PREVENT GOUT   cholecalciferol (VITAMIN D) 1000 UNITS tablet Take 5,000 Units by mouth every other day.    citalopram (CELEXA) 20 MG tablet TAKE 1 TABLET (20 MG TOTAL) BY MOUTH DAILY.   diltiazem (CARDIZEM CD) 360 MG 24 hr capsule TAKE 1 CAPSULE (360 MG TOTAL) BY MOUTH DAILY.   ELIQUIS 5 MG TABS tablet TAKE 1 TABLET BY MOUTH 2 TIMES DAILY.   furosemide (LASIX) 20 MG tablet TAKE 1 TABLET (20 MG TOTAL) BY MOUTH DAILY.   Lancets (FREESTYLE) lancets    letrozole (FEMARA) 2.5 MG tablet TAKE 1 TABLET (2.5 MG TOTAL) BY MOUTH DAILY.   losartan (COZAAR) 50 MG tablet TAKE 1 TABLET (50 MG TOTAL) BY MOUTH DAILY.   pravastatin (PRAVACHOL) 40 MG tablet Take 1 tablet (40 mg total) by mouth daily.   Simethicone (GAS-X PO) Take 2 tablets by mouth daily as needed (bloating).     Allergies:   Buspar [buspirone], Lipitor [atorvastatin], Metformin and related, Metformin hcl, Nsaids, Sulfa antibiotics, Sulfacetamide sodium-sulfur, and Sulfonamide derivatives   Social History   Socioeconomic History   Marital status: Widowed    Spouse name: Not on file   Number of children: Not on file   Years of education: Not on file  Highest education level: Not on file  Occupational History   Occupation: retired  Tobacco Use   Smoking status: Former    Packs/day: 0.50    Years: 25.00    Total pack years: 12.50    Types: Cigarettes    Quit date: 07/30/2006    Years since quitting: 16.0   Smokeless tobacco: Never   Tobacco comments:    Former smoker 07/04/2021  Substance and Sexual Activity   Alcohol use: Not Currently    Alcohol/week: 2.0 standard drinks of alcohol    Types: 2 Standard drinks or equivalent per week    Comment: occasional, none in 3 months   Drug use: Never   Sexual activity: Not on file  Other Topics Concern   Not on file  Social History Narrative   Not on file   Social Determinants of Health   Financial Resource  Strain: Low Risk  (05/24/2021)   Overall Financial Resource Strain (CARDIA)    Difficulty of Paying Living Expenses: Not very hard  Food Insecurity: No Food Insecurity (05/18/2022)   Hunger Vital Sign    Worried About Running Out of Food in the Last Year: Never true    Ran Out of Food in the Last Year: Never true  Transportation Needs: No Transportation Needs (05/18/2022)   PRAPARE - Administrator, Civil Service (Medical): No    Lack of Transportation (Non-Medical): No  Physical Activity: Insufficiently Active (05/18/2022)   Exercise Vital Sign    Days of Exercise per Week: 4 days    Minutes of Exercise per Session: 30 min  Stress: No Stress Concern Present (05/18/2022)   [employer] of Occupational Health - Occupational Stress Questionnaire    Feeling of Stress : Not at all  Social Connections: Moderately Integrated (05/18/2022)   Social Connection and Isolation Panel [NHANES]    Frequency of Communication with Friends and Family: More than three times a week    Frequency of Social Gatherings with Friends and Family: Twice a week    Attends Religious Services: 1 to 4 times per year    Active Member of [employer] or Organizations: Yes    Attends Banker Meetings: More than 4 times per year    Marital Status: Widowed     Family History: The patient's family history includes CVA in her father; Diabetes in her mother; Hypertension in her father.  ROS:   Please see the history of present illness.  All other systems reviewed and are negative.  EKGs/Labs/Other Studies Reviewed:    The following studies were reviewed today:   EKG:   02/28/21:NSR, rate 67, No ST abnormalities 11/28/2020: Normal Sinus Rhythm. Rate 62 bpm. No ST abnormalities   Recent Labs: 03/05/2022: BUN 16; Creatinine, Ser 0.89; Hemoglobin 14.1; Magnesium 2.3; Platelets 178; Potassium 4.5; Sodium 139  Recent Lipid Panel    Component Value Date/Time   CHOL 179 09/02/2020 1035   TRIG 142  09/02/2020 1035   HDL 56 09/02/2020 1035   CHOLHDL 3.2 09/02/2020 1035   VLDL 28 01/14/2017 1114   LDLCALC 99 09/02/2020 1035    Physical Exam:    VS:  BP (!) 158/81 (BP Location: Left Arm, Patient Position: Sitting, Cuff Size: Normal)   Pulse 78   Ht 5\' 5"  (1.651 m)   Wt 146 lb 9.6 oz (66.5 kg)   SpO2 98%   BMI 24.40 kg/m     Wt Readings from Last 3 Encounters:  08/01/22 146 lb 9.6 oz (66.5  kg)  05/24/22 147 lb 9.6 oz (67 kg)  05/04/22 146 lb (66.2 kg)     GEN: Well nourished, well developed in no acute distress HEENT: Normal NECK: No JVD; No carotid bruits LYMPHATICS: No lymphadenopathy CARDIAC: RRR, no murmurs, rubs, gallops RESPIRATORY:  Clear to auscultation without rales, wheezing or rhonchi  ABDOMEN: Soft, non-tender, non-distended MUSCULOSKELETAL:  compression stockings in place SKIN: Warm and dry NEUROLOGIC:  Alert and oriented x 3 PSYCHIATRIC:  Normal affect   ASSESSMENT:    1. Paroxysmal atrial fibrillation (HCC)   2. Chronic diastolic heart failure (HCC)   3. Hyperlipidemia, unspecified hyperlipidemia type   4. Rash   5. Essential hypertension      PLAN:    Atrial fibrillation: Diagnosed during admission with gastroenteritis 10/2020.  CHA2DS2-VASc score 5 (hypertension, age x2, diabetes, female).  Zio patch x2 weeks on 12/22/2020 showed AF burden 4%, average rate 150, with longest episode lasting 2 hours 46 minutes.  Echocardiogram 12/28/2020 showed normal biventricular function, no significant valvular disease. -Continue diltiazem 360 mg daily -Continue Eliquis 5 mg twice daily.  Had recent fall and does report some bruising, but no other falls.  Discussed risks and benefits of anticoagulation and she is comfortable with continuing for now.  Has small purpuric macules on extremities, likely due to Eliquis, has been referred to dermatology.  No bruising on her side where she had a fall last night and denies any pain currently but feels sore, recommended if  symptoms not improved by end of week would contact PCP  Chronic diastolic heart failure: On Lasix 20 mg daily.  Check BMET/magnesium  Hypertension: Currently on diltiazem 360 mg daily and losartan 50 mg daily.  BP 158/81 today but on recheck was 114/60.  Hyperlipidemia: On pravastatin 40 mg daily.  LDL 99 on 09/02/2020.  Check lipid panel  Hypokalemia: Potassium 2.6 on 11/18/2020.  Likely due to vomiting and diarrhea in the setting of Lasix use.  Lasix discontinued but was restarted, potassium has since been normal.  Will recheck BMET as above  T2DM: A1c down to 6.1% in 12/2021.  Currently diet controlled.   RTC in 6 months   Medication Adjustments/Labs and Tests Ordered: Current medicines are reviewed at length with the patient today.  Concerns regarding medicines are outlined above.  Orders Placed This Encounter  Procedures   CBC   Comprehensive metabolic panel   Lipid panel   Magnesium   Ambulatory referral to Dermatology    No orders of the defined types were placed in this encounter.    Patient Instructions  Medication Instructions:  The current medical regimen is effective;  continue present plan and medications.  *If you need a refill on your cardiac medications before your next appointment, please call your pharmacy*   Lab Work: CBC, CMET, MAG, LIPID today   If you have labs (blood work) drawn today and your tests are completely normal, you will receive your results only by: MyChart Message (if you have MyChart) OR A paper copy in the mail If you have any lab test that is abnormal or we need to change your treatment, we will call you to review the results.   Follow-Up: At [facility], you and your health needs are our priority.  As part of our continuing mission to provide you with exceptional heart care, we have created designated Provider Care Teams.  These Care Teams include your primary Cardiologist (physician) and Advanced Practice Providers (APPs -   Physician Assistants and Nurse  Practitioners) who all work together to provide you with the care you need, when you need it.  We recommend signing up for the patient portal called "MyChart".  Sign up information is provided on this After Visit Summary.  MyChart is used to connect with patients for Virtual Visits (Telemedicine).  Patients are able to view lab/test results, encounter notes, upcoming appointments, etc.  Non-urgent messages can be sent to your provider as well.   To learn more about what you can do with MyChart, go to [email or web address].    Your next appointment:   6 month(s)  The format for your next appointment:   In Person  Provider:   Little Ishikawa, MD      If your side (from your fall) is not better by the end of week, please contact your primary care provider.       Signed, Little Ishikawa, MD  08/01/2022 9:14 AM    [facility]

## 2022-08-01 ENCOUNTER — Ambulatory Visit: Payer: Medicare Other | Attending: Cardiology | Admitting: Cardiology

## 2022-08-01 ENCOUNTER — Encounter: Payer: Self-pay | Admitting: Cardiology

## 2022-08-01 VITALS — BP 158/81 | HR 78 | Ht 65.0 in | Wt 146.6 lb

## 2022-08-01 DIAGNOSIS — E785 Hyperlipidemia, unspecified: Secondary | ICD-10-CM

## 2022-08-01 DIAGNOSIS — R21 Rash and other nonspecific skin eruption: Secondary | ICD-10-CM | POA: Diagnosis not present

## 2022-08-01 DIAGNOSIS — I480 Paroxysmal atrial fibrillation: Secondary | ICD-10-CM | POA: Diagnosis not present

## 2022-08-01 DIAGNOSIS — I5032 Chronic diastolic (congestive) heart failure: Secondary | ICD-10-CM

## 2022-08-01 DIAGNOSIS — I10 Essential (primary) hypertension: Secondary | ICD-10-CM

## 2022-08-01 LAB — CBC

## 2022-08-01 NOTE — Patient Instructions (Signed)
Medication Instructions:  The current medical regimen is effective;  continue present plan and medications.  *If you need a refill on your cardiac medications before your next appointment, please call your pharmacy*   Lab Work: CBC, CMET, MAG, LIPID today   If you have labs (blood work) drawn today and your tests are completely normal, you will receive your results only by: [place] (if you have MyChart) OR A paper copy in the mail If you have any lab test that is abnormal or we need to change your treatment, we will call you to review the results.   Follow-Up: At [facility], you and your health needs are our priority.  As part of our continuing mission to provide you with exceptional heart care, we have created designated Provider Care Teams.  These Care Teams include your primary Cardiologist (physician) and Advanced Practice Providers (APPs -  Physician Assistants and Nurse Practitioners) who all work together to provide you with the care you need, when you need it.  We recommend signing up for the patient portal called "MyChart".  Sign up information is provided on this After Visit Summary.  MyChart is used to connect with patients for Virtual Visits (Telemedicine).  Patients are able to view lab/test results, encounter notes, upcoming appointments, etc.  Non-urgent messages can be sent to your provider as well.   To learn more about what you can do with MyChart, go to [email or web address].    Your next appointment:   6 month(s)  The format for your next appointment:   In Person  Provider:   Donato Heinz, MD      If your side (from your fall) is not better by the end of week, please contact your primary care provider.

## 2022-08-02 LAB — LIPID PANEL
Chol/HDL Ratio: 2.2 ratio (ref 0.0–4.4)
Cholesterol, Total: 164 mg/dL (ref 100–199)
HDL: 76 mg/dL (ref >39)
LDL Chol Calc (NIH): 74 mg/dL (ref 0–99)
Triglycerides: 72 mg/dL (ref 0–149)
VLDL Cholesterol Cal: 14 mg/dL (ref 5–40)

## 2022-08-02 LAB — COMPREHENSIVE METABOLIC PANEL
ALT: 23 IU/L (ref 0–32)
AST: 23 IU/L (ref 0–40)
Albumin/Globulin Ratio: 2 (ref 1.2–2.2)
Albumin: 4.4 g/dL (ref 3.7–4.7)
Alkaline Phosphatase: 50 IU/L (ref 44–121)
BUN/Creatinine Ratio: 20 (ref 12–28)
BUN: 17 mg/dL (ref 8–27)
Bilirubin Total: 0.5 mg/dL (ref 0.0–1.2)
CO2: 26 mmol/L (ref 20–29)
Calcium: 9.2 mg/dL (ref 8.7–10.3)
Chloride: 102 mmol/L (ref 96–106)
Creatinine, Ser: 0.85 mg/dL (ref 0.57–1.00)
Globulin, Total: 2.2 g/dL (ref 1.5–4.5)
Glucose: 118 mg/dL — ABNORMAL HIGH (ref 70–99)
Potassium: 3.7 mmol/L (ref 3.5–5.2)
Sodium: 141 mmol/L (ref 134–144)
Total Protein: 6.6 g/dL (ref 6.0–8.5)
eGFR: 67 mL/min/{1.73_m2} (ref >59)

## 2022-08-02 LAB — CBC
Hematocrit: 41.5 % (ref 34.0–46.6)
Hemoglobin: 14 g/dL (ref 11.1–15.9)
MCH: 32.9 pg (ref 26.6–33.0)
MCHC: 33.7 g/dL (ref 31.5–35.7)
MCV: 98 fL — ABNORMAL HIGH (ref 79–97)
Platelets: 206 10*3/uL (ref 150–450)
RBC: 4.25 x10E6/uL (ref 3.77–5.28)
RDW: 13.4 % (ref 11.7–15.4)
WBC: 8.5 10*3/uL (ref 3.4–10.8)

## 2022-08-02 LAB — MAGNESIUM: Magnesium: 2 mg/dL (ref 1.6–2.3)

## 2022-09-03 ENCOUNTER — Ambulatory Visit: Payer: Medicare Other | Admitting: Cardiology

## 2022-09-06 ENCOUNTER — Ambulatory Visit: Payer: Medicare Other | Attending: Internal Medicine | Admitting: Internal Medicine

## 2022-09-06 ENCOUNTER — Encounter: Payer: Self-pay | Admitting: Internal Medicine

## 2022-09-06 VITALS — BP 133/75 | HR 73 | Temp 98.2°F | Ht 65.0 in | Wt 143.0 lb

## 2022-09-06 DIAGNOSIS — D6869 Other thrombophilia: Secondary | ICD-10-CM

## 2022-09-06 DIAGNOSIS — I480 Paroxysmal atrial fibrillation: Secondary | ICD-10-CM

## 2022-09-06 DIAGNOSIS — E1140 Type 2 diabetes mellitus with diabetic neuropathy, unspecified: Secondary | ICD-10-CM | POA: Diagnosis not present

## 2022-09-06 DIAGNOSIS — D229 Melanocytic nevi, unspecified: Secondary | ICD-10-CM

## 2022-09-06 DIAGNOSIS — Z2821 Immunization not carried out because of patient refusal: Secondary | ICD-10-CM

## 2022-09-06 DIAGNOSIS — J3489 Other specified disorders of nose and nasal sinuses: Secondary | ICD-10-CM

## 2022-09-06 DIAGNOSIS — E1159 Type 2 diabetes mellitus with other circulatory complications: Secondary | ICD-10-CM

## 2022-09-06 DIAGNOSIS — I152 Hypertension secondary to endocrine disorders: Secondary | ICD-10-CM

## 2022-09-06 LAB — POCT GLYCOSYLATED HEMOGLOBIN (HGB A1C): HbA1c, POC (controlled diabetic range): 6.1 % (ref 0.0–7.0)

## 2022-09-06 LAB — GLUCOSE, POCT (MANUAL RESULT ENTRY): POC Glucose: 122 mg/dl — AB (ref 70–99)

## 2022-09-06 NOTE — Progress Notes (Signed)
Patient ID: Ruth Delgado, female    DOB: 08-17-1934  MRN: [id number]  CC: Diabetes (Dm f/u. Neoma Laming received flu vax)   Subjective: Ruth Delgado is a 88 y.o. female who presents for chronic ds management Her concerns today include:  Pt with hx of HTN, HL, PAF on anticoagulation, chronic diastolic CHF, DM type 2, GERD, vit D def, DVT RT leg, gout, depression, gout,  DCIS RT breast ER/PR +   HYPERTENSION/CHF diastolic/PAF Currently taking: see medication list: She confirms taking Eliquis 5 mg twice a day, Cardizem 360 mg daily, furosemide 20 mg daily, Cozaar 50 mg daily, Med Adherence: '[x]'$  Yes    '[]'$  No Medication side effects: '[]'$  Yes    '[x]'$  No Adherence with salt restriction: '[x]'$  Yes    '[]'$  No Home Monitoring?: '[x]'$  Yes    '[]'$  No Monitoring Frequency: Daily. Home BP results range: Reports readings have been in the 130s over 70s to 80s. SOB? '[]'$  Yes    '[x]'$  No Chest Pain?: '[]'$  Yes    '[x]'$  No Leg swelling?: '[]'$  Yes    '[x]'$  No Headaches?: '[]'$  Yes    '[x]'$  No Dizziness? '[]'$  Yes    '[x]'$  No Comments: Bruises easily on Eliquis especially if she scratches herself arms or legs.  She has not had any nasal bleeding, blood in the stools or in the urine.  DM: Results for orders placed or performed in visit on 09/06/22  POCT glucose (manual entry)  Result Value Ref Range   POC Glucose 122 (A) 70 - 99 mg/dl  POCT glycosylated hemoglobin (Hb A1C)  Result Value Ref Range   Hemoglobin A1C     HbA1c POC (<> result, manual entry)     HbA1c, POC (prediabetic range)     HbA1c, POC (controlled diabetic range) 6.1 0.0 - 7.0 %  Does well with eating habits Walks 10-15 mins daily with her dog.  Worried about her dog who now has cancer.  She is a little depressed because she will have to put her dog down soon. Reports some tingling in the toes at times.  Reports being seen at urgent care 2 times about a month ago for some swelling and congestion in the right nostril.  She was advised to use some saline nasal  spray and Claritin and this got better.  Requests referral to dermatology for some skin changes on the face and a small lump on the scalp right frontal area.  HM: She declines pneumonia vaccine.  Patient Active Problem List   Diagnosis Date Noted   Hyperglycemia due to type 2 diabetes mellitus ([place]) 01/02/2022   Hypercholesterolemia 01/02/2022   History of shingles 01/02/2022   Essential hypertension 05/24/2021   Intraductal carcinoma in situ of right breast 05/23/2021   Ductal carcinoma in situ (DCIS) of right breast 05/19/2021   Paroxysmal atrial fibrillation ([place]) 12/09/2020   Secondary hypercoagulable state ([place]) 12/09/2020   Atrial fibrillation with RVR ([place]) 11/15/2020   Nausea, vomiting and diarrhea 11/15/2020   Chronic atrial fibrillation ([place]) 11/15/2020   CKD stage 2 due to type 2 diabetes mellitus ([place]) 03/06/2018   Overweight (BMI 25.0-29.9) 03/06/2018   Osteopenia 03/06/2018   Esophageal reflux 09/06/2015   Vitamin D deficiency 06/08/2013   Medication management 06/08/2013   Hypertension    Hyperlipemia    Gout    Type 2 diabetes mellitus with stage 3 chronic kidney disease, without long-term current use of insulin ([place])      Current Outpatient Medications on File  Prior to Visit  Medication Sig Dispense Refill   acetaminophen (TYLENOL) 650 MG CR tablet Take 650 mg by mouth every 8 (eight) hours as needed for pain.     allopurinol (ZYLOPRIM) 300 MG tablet TAKE 1 TABLET BY MOUTH DAILY TO PREVENT GOUT 90 tablet 0   cholecalciferol (VITAMIN D) 1000 UNITS tablet Take 5,000 Units by mouth every other day.      citalopram (CELEXA) 20 MG tablet TAKE 1 TABLET (20 MG TOTAL) BY MOUTH DAILY. 90 tablet 0   diltiazem (CARDIZEM CD) 360 MG 24 hr capsule TAKE 1 CAPSULE (360 MG TOTAL) BY MOUTH DAILY. 30 capsule 7   ELIQUIS 5 MG TABS tablet TAKE 1 TABLET BY MOUTH 2 TIMES DAILY. 60 tablet 3   furosemide (LASIX) 20 MG tablet TAKE 1 TABLET (20 MG TOTAL) BY MOUTH DAILY. 30 tablet 6    Lancets (FREESTYLE) lancets   12   letrozole (FEMARA) 2.5 MG tablet TAKE 1 TABLET (2.5 MG TOTAL) BY MOUTH DAILY. 90 tablet 3   losartan (COZAAR) 50 MG tablet TAKE 1 TABLET (50 MG TOTAL) BY MOUTH DAILY. 90 tablet 2   pravastatin (PRAVACHOL) 40 MG tablet Take 1 tablet (40 mg total) by mouth daily. 90 tablet 1   Simethicone (GAS-X PO) Take 2 tablets by mouth daily as needed (bloating).     No current facility-administered medications on file prior to visit.    Allergies  Allergen Reactions   Buspar [Buspirone]     dysphoria   Lipitor [Atorvastatin]     Myalgias   Metformin And Related     Gas/bloating   Metformin Hcl Other (See Comments)   Nsaids     GI upset   Sulfa Antibiotics Other (See Comments)    Reaction=throat itching   Sulfacetamide Sodium-Sulfur Other (See Comments)   Sulfonamide Derivatives     Social History   Socioeconomic History   Marital status: Widowed    Spouse name: Not on file   Number of children: Not on file   Years of education: Not on file   Highest education level: Not on file  Occupational History   Occupation: retired  Tobacco Use   Smoking status: Former    Packs/day: 0.50    Years: 25.00    Total pack years: 12.50    Types: Cigarettes    Quit date: 07/30/2006    Years since quitting: 16.1   Smokeless tobacco: Never   Tobacco comments:    Former smoker 07/04/2021  Substance and Sexual Activity   Alcohol use: Not Currently    Alcohol/week: 2.0 standard drinks of alcohol    Types: 2 Standard drinks or equivalent per week    Comment: occasional, none in 3 months   Drug use: Never   Sexual activity: Not on file  Other Topics Concern   Not on file  Social History Narrative   Not on file   Social Determinants of Health   Financial Resource Strain: Low Risk  (05/24/2021)   Overall Financial Resource Strain (CARDIA)    Difficulty of Paying Living Expenses: Not very hard  Food Insecurity: No Food Insecurity (05/18/2022)   Hunger Vital  Sign    Worried About Running Out of Food in the Last Year: Never true    [place] in the Last Year: Never true  Transportation Needs: No Transportation Needs (05/18/2022)   PRAPARE - Hydrologist (Medical): No    Lack of Transportation (Non-Medical): No  Physical  Activity: Insufficiently Active (05/18/2022)   Exercise Vital Sign    Days of Exercise per Week: 4 days    Minutes of Exercise per Session: 30 min  Stress: No Stress Concern Present (05/18/2022)   [place]    Feeling of Stress : Not at all  Social Connections: Moderately Integrated (05/18/2022)   Social Connection and Isolation Panel [NHANES]    Frequency of Communication with Friends and Family: More than three times a week    Frequency of Social Gatherings with Friends and Family: Twice a week    Attends Religious Services: 1 to 4 times per year    Active Member of [employer] or Organizations: Yes    Attends Archivist Meetings: More than 4 times per year    Marital Status: Widowed  Intimate Partner Violence: Not At Risk (05/18/2022)   Humiliation, Afraid, Rape, and Kick questionnaire    Fear of Current or Ex-Partner: No    Emotionally Abused: No    Physically Abused: No    Sexually Abused: No    Family History  Problem Relation Age of Onset   Diabetes Mother        suicide at age 60   Hypertension Father    CVA Father     Past Surgical History:  Procedure Laterality Date   ABDOMINAL HYSTERECTOMY     BIOPSY BREAST     (L) BREAST IN 1993   KNEE ARTHROSCOPY      ROS: Review of Systems Negative except as stated above  PHYSICAL EXAM: BP (!) 148/73 (BP Location: Left Arm, Patient Position: Sitting, Cuff Size: Normal)   Pulse 73   Temp 98.2 F (36.8 C) (Oral)   Ht '5\' 5"'$  (1.651 m)   Wt 143 lb (64.9 kg)   SpO2 97%   BMI 23.80 kg/m   Physical Exam  General appearance - alert, well appearing, and  in no distress Mental status - normal mood, behavior, speech, dress, motor activity, and thought processes Neck - supple, no significant adenopathy Nose: No enlargement of nasal turbinates. Chest - clear to auscultation, no wheezes, rales or rhonchi, symmetric air entry Heart -irregularly irregular but rate controlled.   Extremities - peripheral pulses normal, no pedal edema, no clubbing or cyanosis Skin -some areas of resolving ecchymosis on both forearms. She has some areas of what looks like actinic keratosis scattered over the face.  She has a cyst that is less than 1 cm palpated on the scalp in the right frontal area.    09/06/2022   12:08 PM 02/09/2022   11:35 AM 05/30/2020   12:17 PM  MMSE - Mini Mental State Exam  Orientation to time '5 5 5  '$ Orientation to Place '5 5 5  '$ Registration '3 3 3  '$ Attention/ Calculation '5 5 5  '$ Recall '3 1 3  '$ Language- name 2 objects '2 2 2  '$ Language- repeat '1 1 1  '$ Language- follow 3 step command '3 3 3  '$ Language- read & follow direction '1 1 1  '$ Write a sentence '1 1 1  '$ Copy design '1 1 1  '$ Total score '30 28 30       '$ Latest Ref Rng & Units 08/01/2022    9:18 AM 03/05/2022    2:14 PM 09/05/2021    3:31 PM  CMP  Glucose 70 - 99 mg/dL 118  99  98   BUN 8 - 27 mg/dL '17  16  21   '$ Creatinine 0.57 -  1.00 mg/dL 0.85  0.89  0.86   Sodium 134 - 144 mmol/L 141  139  144   Potassium 3.5 - 5.2 mmol/L 3.7  4.5  4.4   Chloride 96 - 106 mmol/L 102  100  103   CO2 20 - 29 mmol/L '26  27  22   '$ Calcium 8.7 - 10.3 mg/dL 9.2  9.8  9.5   Total Protein 6.0 - 8.5 g/dL 6.6     Total Bilirubin 0.0 - 1.2 mg/dL 0.5     Alkaline Phos 44 - 121 IU/L 50     AST 0 - 40 IU/L 23     ALT 0 - 32 IU/L 23      Lipid Panel     Component Value Date/Time   CHOL 164 08/01/2022 0918   TRIG 72 08/01/2022 0918   HDL 76 08/01/2022 0918   CHOLHDL 2.2 08/01/2022 0918   CHOLHDL 3.2 09/02/2020 1035   VLDL 28 01/14/2017 1114   LDLCALC 74 08/01/2022 0918   LDLCALC 99 09/02/2020 1035    CBC     Component Value Date/Time   WBC 8.5 08/01/2022 0918   WBC 6.9 05/24/2021 0808   WBC 7.4 12/01/2020 1615   RBC 4.25 08/01/2022 0918   RBC 4.06 05/24/2021 0808   HGB 14.0 08/01/2022 0918   HCT 41.5 08/01/2022 0918   PLT 206 08/01/2022 0918   MCV 98 (H) 08/01/2022 0918   MCH 32.9 08/01/2022 0918   MCH 33.0 05/24/2021 0808   MCHC 33.7 08/01/2022 0918   MCHC 33.8 05/24/2021 0808   RDW 13.4 08/01/2022 0918   LYMPHSABS 2.4 05/24/2021 0808   MONOABS 0.5 05/24/2021 0808   EOSABS 0.1 05/24/2021 0808   BASOSABS 0.1 05/24/2021 0808    ASSESSMENT AND PLAN:  1. Controlled type 2 diabetes with neuropathy ([place]) Encouraged her to continue healthy eating habits and regular exercise. - POCT glucose (manual entry) - POCT glycosylated hemoglobin (Hb A1C)  2. Hypertension associated with diabetes ([place]) Not at goal but patient reports good readings at home.  She will continue medications listed above.  3. PAF (paroxysmal atrial fibrillation) (HCC) Continue Eliquis. Recent CBC done a little over a month ago revealed normal hemoglobin and platelet counts. 4. Atypical mole These look more like actinic keratosis.  Will refer to dermatology. - Ambulatory referral to Dermatology  5. Nasal pain No lesions seen in the nose at this time.  Advised patient that when she has allergies or sinus congestion, she can use Flonase nasal spray as needed along with the Claritin.  6. Pneumococcal vaccination declined  7.  Secondary hypercoagulable state Due to paroxysmal atrial fibrillation.  Currently on Eliquis.  Patient was given the opportunity to ask questions.  Patient verbalized understanding of the plan and was able to repeat key elements of the plan.   This documentation was completed using Radio producer.  Any transcriptional errors are unintentional.  Orders Placed This Encounter  Procedures   POCT glucose (manual entry)   POCT glycosylated hemoglobin (Hb A1C)      Requested Prescriptions    No prescriptions requested or ordered in this encounter    No follow-ups on file.  Karle Plumber, MD, FACP

## 2022-09-08 ENCOUNTER — Other Ambulatory Visit: Payer: Self-pay | Admitting: Internal Medicine

## 2022-09-26 ENCOUNTER — Other Ambulatory Visit: Payer: Self-pay | Admitting: Internal Medicine

## 2022-09-26 DIAGNOSIS — Z8739 Personal history of other diseases of the musculoskeletal system and connective tissue: Secondary | ICD-10-CM

## 2022-10-10 ENCOUNTER — Other Ambulatory Visit: Payer: Self-pay | Admitting: Internal Medicine

## 2022-10-11 NOTE — Telephone Encounter (Signed)
Unable to refill per protocol, Rx was refilled 09/10/22 for 30 days, note on request states:REQUEST FURTHER REFILLS FROM CARDIOLOGIST.

## 2022-10-12 ENCOUNTER — Other Ambulatory Visit: Payer: Self-pay | Admitting: *Deleted

## 2022-10-12 ENCOUNTER — Other Ambulatory Visit: Payer: Self-pay | Admitting: Internal Medicine

## 2022-10-12 ENCOUNTER — Other Ambulatory Visit: Payer: Self-pay | Admitting: Cardiology

## 2022-10-12 DIAGNOSIS — I4891 Unspecified atrial fibrillation: Secondary | ICD-10-CM

## 2022-10-12 MED ORDER — APIXABAN 5 MG PO TABS: 5.0000 mg | ORAL_TABLET | Freq: Two times a day (BID) | ORAL | 1 refills | Status: DC

## 2022-10-12 NOTE — Telephone Encounter (Signed)
Prescription refill request for Eliquis received.  Indication: afib  Last office visit: Ruth Delgado 08/01/2022 Scr: 0.85, 08/01/2022 Age: 88 yo  Weight: 64.9 kg   Refill sent.

## 2022-10-12 NOTE — Telephone Encounter (Signed)
Eliquis 5mg  refill request received. Patient is 88 years old, weight-64.9kg, Crea-0.85 on 08/01/22, Diagnosis-Afib, and last seen by Dr. Gardiner Rhyme on 08/01/22. Dose is appropriate based on dosing criteria. Will send in refill to requested pharmacy.

## 2022-10-12 NOTE — Telephone Encounter (Signed)
*  STAT* If patient is at the pharmacy, call can be transferred to refill team.   1. Which medications need to be refilled? (please list name of each medication and dose if known) apixaban (ELIQUIS) 5 MG TABS tablet   2. Which pharmacy/location (including street and city if local pharmacy) is medication to be sent to? [place], [place]   3. Do they need a 30 day or 90 day supply? [place]

## 2022-10-26 ENCOUNTER — Other Ambulatory Visit: Payer: Self-pay | Admitting: Internal Medicine

## 2022-10-26 NOTE — Telephone Encounter (Signed)
Requested Prescriptions  Pending Prescriptions Disp Refills   citalopram (CELEXA) 20 MG tablet [Pharmacy Med Name: CITALOPRAM HBR 20 MG TABLET 20 Tablet] 90 tablet 0    Sig: TAKE 1 TABLET (20 MG TOTAL) BY MOUTH DAILY.     Psychiatry:  Antidepressants - SSRI Passed - 10/26/2022 10:44 AM      Passed - Valid encounter within last 6 months    Recent Outpatient Visits           1 month ago Controlled type 2 diabetes with neuropathy [facility])   [place] Ladell Pier, MD   5 months ago Encounter for [employer] annual wellness exam   [place] Karle Plumber B, MD   5 months ago Controlled type 2 diabetes with neuropathy [facility])   [place] Ladell Pier, MD   8 months ago Encounter for [employer] annual wellness exam   [place], RPH-CPP   9 months ago Controlled type 2 diabetes with neuropathy [facility])   [place], MD       Future Appointments             In 2 months Donato Heinz, MD [facility]   In 2 months Wynetta Emery, Dalbert Batman, MD [place]

## 2022-10-30 LAB — HM DIABETES EYE EXAM

## 2022-11-14 ENCOUNTER — Encounter: Payer: Self-pay | Admitting: Hematology and Oncology

## 2022-11-21 ENCOUNTER — Encounter: Payer: Self-pay | Admitting: Internal Medicine

## 2022-11-21 NOTE — Progress Notes (Signed)
I received mammogram report from [facility] mammography.  Mammogram done 11/13/2022.  This showed a barbell shaped biopsy clip within the right breast lower inner quadrant, middle to posterior depth at site of previous biopsy.  Site is unchanged. Impression was not known biopsy-proven malignancy.  Stable appearance of right breast marker clip at site of low-grade DCIS.  Follow-up mammogram in 6 months is recommended per surveillance protocol.  Results were discussed with the patient verbally and in writing by the radiologist Dr. Gaetano Net.

## 2022-12-12 ENCOUNTER — Other Ambulatory Visit: Payer: Self-pay | Admitting: Internal Medicine

## 2022-12-12 DIAGNOSIS — E785 Hyperlipidemia, unspecified: Secondary | ICD-10-CM

## 2022-12-24 NOTE — Progress Notes (Deleted)
Cardiology Office Note:    Date:  12/24/2022   ID:  Ruth Delgado, DOB 04/13/1935, MRN [id number]  PCP:  Marcine Matar, MD  Cardiologist:  Little Ishikawa, MD  Electrophysiologist:  None   Referring MD: Marcine Matar, MD   No chief complaint on file.   History of Present Illness:    Ruth Delgado is a 88 y.o. female with a hx of atrial fibrillation, T2DM, hypertension, hyperlipidemia, gout who presents for follow-up.  She was referred by Dr. Oneta Rack for evaluation of atrial fibrillation, initially seen on 08/01/2022.  She was admitted to [facility] from 4/19 through 11/18/2020.  She presented with sudden onset nausea/vomiting/diarrhea.  On the ED she went into A. fib with RVR and was started on diltiazem drip.  This was transitioned to p.o. diltiazem and she was started on Eliquis 5 mg twice daily.  Zio patch x2 weeks on 12/22/2020 showed AF burden 4%, average rate 150, with longest episode lasting 2 hours 46 minutes.  Echocardiogram 12/28/2020 showed normal biventricular function, no significant valvular disease.  Since last clinic visit,  she reports she had a fall last night.  States that she tripped walking down the steps to her garage.  Landed on her left side, did not hit her head.  She denies any pain but states she feels sore today.  Denies any chest pain, dyspnea, lightheadedness, syncope, lower extremity edema.  Occasional palpitations, occurs about once every 2 weeks.  Denies any blood in stool or urine.  Reports has been having bruising on arms and legs.    Past Medical History:  Diagnosis Date   A-fib (HCC)    Anxiety    Arthritis    Breast cancer (HCC)    Diabetes mellitus    Gout    Hyperlipemia    Hypertension     Past Surgical History:  Procedure Laterality Date   ABDOMINAL HYSTERECTOMY     BIOPSY BREAST     (L) BREAST IN 1993   KNEE ARTHROSCOPY      Current Medications: No outpatient medications have been marked as taking for the 12/26/22  encounter (Appointment) with Little Ishikawa, MD.     Allergies:   Buspar [buspirone], Lipitor [atorvastatin], Metformin and related, Metformin hcl, Nsaids, Sulfa antibiotics, Sulfacetamide sodium-sulfur, and Sulfonamide derivatives   Social History   Socioeconomic History   Marital status: Widowed    Spouse name: Not on file   Number of children: Not on file   Years of education: Not on file   Highest education level: Not on file  Occupational History   Occupation: retired  Tobacco Use   Smoking status: Former    Packs/day: 0.50    Years: 25.00    Additional pack years: 0.00    Total pack years: 12.50    Types: Cigarettes    Quit date: 07/30/2006    Years since quitting: 16.4   Smokeless tobacco: Never   Tobacco comments:    Former smoker 07/04/2021  Substance and Sexual Activity   Alcohol use: Not Currently    Alcohol/week: 2.0 standard drinks of alcohol    Types: 2 Standard drinks or equivalent per week    Comment: occasional, none in 3 months   Drug use: Never   Sexual activity: Not on file  Other Topics Concern   Not on file  Social History Narrative   Not on file   Social Determinants of Health   Financial Resource Strain: Low Risk  (05/24/2021)  Overall Financial Resource Strain (CARDIA)    Difficulty of Paying Living Expenses: Not very hard  Food Insecurity: No Food Insecurity (05/18/2022)   Hunger Vital Sign    Worried About Running Out of Food in the Last Year: Never true    Ran Out of Food in the Last Year: Never true  Transportation Needs: No Transportation Needs (05/18/2022)   PRAPARE - Administrator, Civil Service (Medical): No    Lack of Transportation (Non-Medical): No  Physical Activity: Insufficiently Active (05/18/2022)   Exercise Vital Sign    Days of Exercise per Week: 4 days    Minutes of Exercise per Session: 30 min  Stress: No Stress Concern Present (05/18/2022)   [employer] of Occupational Health -  Occupational Stress Questionnaire    Feeling of Stress : Not at all  Social Connections: Moderately Integrated (05/18/2022)   Social Connection and Isolation Panel [NHANES]    Frequency of Communication with Friends and Family: More than three times a week    Frequency of Social Gatherings with Friends and Family: Twice a week    Attends Religious Services: 1 to 4 times per year    Active Member of [employer] or Organizations: Yes    Attends Banker Meetings: More than 4 times per year    Marital Status: Widowed     Family History: The patient's family history includes CVA in her father; Diabetes in her mother; Hypertension in her father.  ROS:   Please see the history of present illness.  All other systems reviewed and are negative.  EKGs/Labs/Other Studies Reviewed:    The following studies were reviewed today:   EKG:   02/28/21:NSR, rate 67, No ST abnormalities 11/28/2020: Normal Sinus Rhythm. Rate 62 bpm. No ST abnormalities   Recent Labs: 08/01/2022: ALT 23; BUN 17; Creatinine, Ser 0.85; Hemoglobin 14.0; Magnesium 2.0; Platelets 206; Potassium 3.7; Sodium 141  Recent Lipid Panel    Component Value Date/Time   CHOL 164 08/01/2022 0918   TRIG 72 08/01/2022 0918   HDL 76 08/01/2022 0918   CHOLHDL 2.2 08/01/2022 0918   CHOLHDL 3.2 09/02/2020 1035   VLDL 28 01/14/2017 1114   LDLCALC 74 08/01/2022 0918   LDLCALC 99 09/02/2020 1035    Physical Exam:    VS:  There were no vitals taken for this visit.    Wt Readings from Last 3 Encounters:  09/06/22 143 lb (64.9 kg)  08/01/22 146 lb 9.6 oz (66.5 kg)  05/24/22 147 lb 9.6 oz (67 kg)     GEN: Well nourished, well developed in no acute distress HEENT: Normal NECK: No JVD; No carotid bruits LYMPHATICS: No lymphadenopathy CARDIAC: RRR, no murmurs, rubs, gallops RESPIRATORY:  Clear to auscultation without rales, wheezing or rhonchi  ABDOMEN: Soft, non-tender, non-distended MUSCULOSKELETAL:  compression stockings  in place SKIN: Warm and dry NEUROLOGIC:  Alert and oriented x 3 PSYCHIATRIC:  Normal affect   ASSESSMENT:    No diagnosis found.    PLAN:    Atrial fibrillation: Diagnosed during admission with gastroenteritis 10/2020.  CHA2DS2-VASc score 5 (hypertension, age x2, diabetes, female).  Zio patch x2 weeks on 12/22/2020 showed AF burden 4%, average rate 150, with longest episode lasting 2 hours 46 minutes.  Echocardiogram 12/28/2020 showed normal biventricular function, no significant valvular disease. -Continue diltiazem 360 mg daily -Continue Eliquis 5 mg twice daily.  Had fall and does report some bruising, but no other falls.  Discussed risks and benefits of anticoagulation and she  is comfortable with continuing for now.  Has small purpuric macules on extremities, likely due to Eliquis, has been referred to dermatology.  No bruising on her side where she had a fall last night and denies any pain currently but feels sore, recommended if symptoms not improved by end of week would contact PCP  Chronic diastolic heart failure: On Lasix 20 mg daily.  Check BMET/magnesium  Hypertension: Currently on diltiazem 360 mg daily and losartan 50 mg daily.   Hyperlipidemia: On pravastatin 40 mg daily.  LDL 74 on 08/01/2022  Hypokalemia: Potassium 2.6 on 11/18/2020.  Likely due to vomiting and diarrhea in the setting of Lasix use.  Lasix discontinued but was restarted, potassium has since been normal.   T2DM: A1c down to 6.1% in 12/2021.  Currently diet controlled.   RTC in 6 months***   Medication Adjustments/Labs and Tests Ordered: Current medicines are reviewed at length with the patient today.  Concerns regarding medicines are outlined above.  No orders of the defined types were placed in this encounter.   No orders of the defined types were placed in this encounter.    There are no Patient Instructions on file for this visit.    Signed, Little Ishikawa, MD  12/24/2022 8:37 PM    [facility]

## 2022-12-25 ENCOUNTER — Other Ambulatory Visit: Payer: Self-pay | Admitting: Cardiology

## 2022-12-26 ENCOUNTER — Ambulatory Visit: Payer: Medicare Other | Admitting: Cardiology

## 2023-01-01 ENCOUNTER — Other Ambulatory Visit: Payer: Self-pay | Admitting: Internal Medicine

## 2023-01-01 DIAGNOSIS — Z8739 Personal history of other diseases of the musculoskeletal system and connective tissue: Secondary | ICD-10-CM

## 2023-01-01 NOTE — Telephone Encounter (Signed)
Requested medications are due for refill today.  yes  Requested medications are on the active medications list.  yes  Last refill. 09/26/2022 #90 0 rf  Future visit scheduled.   yes  Notes to clinic.  Expired labs.    Requested Prescriptions  Pending Prescriptions Disp Refills   allopurinol (ZYLOPRIM) 300 MG tablet [Pharmacy Med Name: ALLOPURINOL 300 MG TABS 300 Tablet] 90 tablet 0    Sig: TAKE 1 TABLET BY MOUTH DAILY TO PREVENT GOUT     Endocrinology:  Gout Agents - allopurinol Failed - 01/01/2023  1:11 PM      Failed - Uric Acid in normal range and within 360 days    Uric Acid, Serum  Date Value Ref Range Status  09/02/2020 3.8 2.5 - 7.0 mg/dL Final    Comment:    Therapeutic target for gout patients: <6.0 mg/dL .          Failed - CBC within normal limits and completed in the last 12 months    WBC  Date Value Ref Range Status  08/01/2022 8.5 3.4 - 10.8 x10E3/uL Final  12/01/2020 7.4 3.8 - 10.8 Thousand/uL Final   WBC Count  Date Value Ref Range Status  05/24/2021 6.9 4.0 - 10.5 K/uL Final   RBC  Date Value Ref Range Status  08/01/2022 4.25 3.77 - 5.28 x10E6/uL Final  05/24/2021 4.06 3.87 - 5.11 MIL/uL Final   Hemoglobin  Date Value Ref Range Status  08/01/2022 14.0 11.1 - 15.9 g/dL Final   Hematocrit  Date Value Ref Range Status  08/01/2022 41.5 34.0 - 46.6 % Final   MCHC  Date Value Ref Range Status  08/01/2022 33.7 31.5 - 35.7 g/dL Final  78/29/5621 30.8 30.0 - 36.0 g/dL Final   [facility]  Date Value Ref Range Status  08/01/2022 32.9 26.6 - 33.0 pg Final  05/24/2021 33.0 26.0 - 34.0 pg Final   MCV  Date Value Ref Range Status  08/01/2022 98 (H) 79 - 97 fL Final   No results found for: "PLTCOUNTKUC", "LABPLAT", "POCPLA" RDW  Date Value Ref Range Status  08/01/2022 13.4 11.7 - 15.4 % Final         Passed - Cr in normal range and within 360 days    Creatinine  Date Value Ref Range Status  05/24/2021 0.86 0.44 - 1.00 mg/dL Final   Creat  Date  Value Ref Range Status  12/01/2020 0.80 0.60 - 0.88 mg/dL Final    Comment:    For patients >54 years of age, the reference limit for Creatinine is approximately 13% higher for people identified as African-American. .    Creatinine, Ser  Date Value Ref Range Status  08/01/2022 0.85 0.57 - 1.00 mg/dL Final   Creatinine, Urine  Date Value Ref Range Status  02/11/2020 104 20 - 275 mg/dL Final         Passed - Valid encounter within last 12 months    Recent Outpatient Visits           3 months ago Controlled type 2 diabetes with neuropathy [facility])   [place] [facility] Marcine Matar, MD   7 months ago Encounter for [employer] annual wellness exam   [place] [facility] Jonah Blue B, MD   8 months ago Controlled type 2 diabetes with neuropathy [facility])   [place] [facility] Marcine Matar, MD   10 months ago Encounter for [employer] annual wellness  exam   [facility] [place], Cornelius Moras, RPH-CPP   12 months ago Controlled type 2 diabetes with neuropathy [facility])   [place] [facility] Marcine Matar, MD       Future Appointments             In 6 days Marcine Matar, MD [facility]   In 1 month Bjorn Pippin Tanna Savoy, MD [facility]

## 2023-01-07 ENCOUNTER — Ambulatory Visit: Payer: Medicare Other | Attending: Internal Medicine | Admitting: Internal Medicine

## 2023-01-07 ENCOUNTER — Encounter: Payer: Self-pay | Admitting: Internal Medicine

## 2023-01-07 VITALS — BP 155/70 | HR 67 | Temp 98.6°F | Ht 65.0 in | Wt 143.0 lb

## 2023-01-07 DIAGNOSIS — J301 Allergic rhinitis due to pollen: Secondary | ICD-10-CM

## 2023-01-07 DIAGNOSIS — D692 Other nonthrombocytopenic purpura: Secondary | ICD-10-CM

## 2023-01-07 DIAGNOSIS — R490 Dysphonia: Secondary | ICD-10-CM

## 2023-01-07 DIAGNOSIS — I10 Essential (primary) hypertension: Secondary | ICD-10-CM

## 2023-01-07 DIAGNOSIS — I480 Paroxysmal atrial fibrillation: Secondary | ICD-10-CM

## 2023-01-07 DIAGNOSIS — E1140 Type 2 diabetes mellitus with diabetic neuropathy, unspecified: Secondary | ICD-10-CM | POA: Diagnosis not present

## 2023-01-07 DIAGNOSIS — Z7984 Long term (current) use of oral hypoglycemic drugs: Secondary | ICD-10-CM

## 2023-01-07 LAB — GLUCOSE, POCT (MANUAL RESULT ENTRY): POC Glucose: 129 mg/dl — AB (ref 70–99)

## 2023-01-07 LAB — POCT GLYCOSYLATED HEMOGLOBIN (HGB A1C): HbA1c, POC (controlled diabetic range): 5.9 % (ref 0.0–7.0)

## 2023-01-07 NOTE — Progress Notes (Signed)
Patient ID: Ruth Delgado, female    DOB: 03-Jan-1935  MRN: [id number]  CC: Diabetes (DM f/u. /Experiencing hoarseness when talking a long time - suspects seasonal allergies)   Subjective: Ruth Delgado is a 88 y.o. female who presents for chronic ds management Her concerns today include:  Pt with hx of HTN, HL, PAF on anticoagulation, chronic diastolic CHF, DM type 2, GERD, vit D def, DVT RT leg, gout, depression, gout,  DCIS RT breast ER/PR +   HTN/CHF/PAF: checks BP 2-3x/wk and has a log with her.  Most recent readings were 129/76 and 125/69.  BP high in doctors office.  She feels she has a little bit of whitecoat hypertension.  Reports compliance with diltiazem 360 mg daily, furosemide 20 mg daily and Cozaar 50 mg daily.  Gets a little hoarse when she talks a lot x 5 mths.   She wonders whether it is allergies because it gets better with use of Claritin.  Endorses rhinorrhea, sneezing, itchy eyes intermittently.  Takes Claritin about 2-3 times a week.    DM Results for orders placed or performed in visit on 01/07/23  POCT glucose (manual entry)  Result Value Ref Range   POC Glucose 129 (A) 70 - 99 mg/dl  POCT glycosylated hemoglobin (Hb A1C)  Result Value Ref Range   Hemoglobin A1C     HbA1c POC (<> result, manual entry)     HbA1c, POC (prediabetic range)     HbA1c, POC (controlled diabetic range) 5.9 0.0 - 7.0 %   Saw eye doctor in April.  Has cataracts Doing well with eating habits Walks dog occasionally.  Works outside several times a wk and works around her house Numbness in legs from knees down; all the time x 1 yr.  No pain  Bleed easily on Eliquis.  No falls  Patient Active Problem List   Diagnosis Date Noted   Hyperglycemia due to type 2 diabetes mellitus (HCC) 01/02/2022   Hypercholesterolemia 01/02/2022   History of shingles 01/02/2022   Essential hypertension 05/24/2021   Intraductal carcinoma in situ of right breast 05/23/2021   Ductal carcinoma in situ  (DCIS) of right breast 05/19/2021   Paroxysmal atrial fibrillation (HCC) 12/09/2020   Secondary hypercoagulable state (HCC) 12/09/2020   Atrial fibrillation with RVR (HCC) 11/15/2020   Nausea, vomiting and diarrhea 11/15/2020   Chronic atrial fibrillation (HCC) 11/15/2020   CKD stage 2 due to type 2 diabetes mellitus (HCC) 03/06/2018   Overweight (BMI 25.0-29.9) 03/06/2018   Osteopenia 03/06/2018   Esophageal reflux 09/06/2015   Vitamin D deficiency 06/08/2013   Medication management 06/08/2013   Hypertension    Hyperlipemia    Gout    Type 2 diabetes mellitus with stage 3 chronic kidney disease, without long-term current use of insulin (HCC)      Current Outpatient Medications on File Prior to Visit  Medication Sig Dispense Refill   acetaminophen (TYLENOL) 650 MG CR tablet Take 650 mg by mouth every 8 (eight) hours as needed for pain.     allopurinol (ZYLOPRIM) 300 MG tablet TAKE 1 TABLET BY MOUTH DAILY TO PREVENT GOUT 30 tablet 0   apixaban (ELIQUIS) 5 MG TABS tablet Take 1 tablet (5 mg total) by mouth 2 (two) times daily. 180 tablet 1   cholecalciferol (VITAMIN D) 1000 UNITS tablet Take 5,000 Units by mouth every other day.      citalopram (CELEXA) 20 MG tablet TAKE 1 TABLET (20 MG TOTAL) BY MOUTH DAILY. 90 tablet  0   diltiazem (CARDIZEM CD) 360 MG 24 hr capsule TAKE 1 CAPSULE (360 MG TOTAL) BY MOUTH DAILY. 30 capsule 2   furosemide (LASIX) 20 MG tablet TAKE 1 TABLET (20 MG TOTAL) BY MOUTH DAILY. 30 tablet 6   Lancets (FREESTYLE) lancets   12   letrozole (FEMARA) 2.5 MG tablet TAKE 1 TABLET (2.5 MG TOTAL) BY MOUTH DAILY. 90 tablet 3   losartan (COZAAR) 50 MG tablet TAKE 1 TABLET (50 MG TOTAL) BY MOUTH DAILY. 90 tablet 2   pravastatin (PRAVACHOL) 40 MG tablet TAKE 1 TABLET (40 MG TOTAL) BY MOUTH DAILY. 90 tablet 0   Simethicone (GAS-X PO) Take 2 tablets by mouth daily as needed (bloating).     No current facility-administered medications on file prior to visit.    Allergies   Allergen Reactions   Buspar [Buspirone]     dysphoria   Lipitor [Atorvastatin]     Myalgias   Metformin And Related     Gas/bloating   Metformin Hcl Other (See Comments)   Nsaids     GI upset   Sulfa Antibiotics Other (See Comments)    Reaction=throat itching   Sulfacetamide Sodium-Sulfur Other (See Comments)   Sulfonamide Derivatives     Social History   Socioeconomic History   Marital status: Widowed    Spouse name: Not on file   Number of children: Not on file   Years of education: Not on file   Highest education level: Not on file  Occupational History   Occupation: retired  Tobacco Use   Smoking status: Former    Packs/day: 0.50    Years: 25.00    Additional pack years: 0.00    Total pack years: 12.50    Types: Cigarettes    Quit date: 07/30/2006    Years since quitting: 16.4   Smokeless tobacco: Never   Tobacco comments:    Former smoker 07/04/2021  Substance and Sexual Activity   Alcohol use: Not Currently    Alcohol/week: 2.0 standard drinks of alcohol    Types: 2 Standard drinks or equivalent per week    Comment: occasional, none in 3 months   Drug use: Never   Sexual activity: Not on file  Other Topics Concern   Not on file  Social History Narrative   Not on file   Social Determinants of Health   Financial Resource Strain: Low Risk  (05/24/2021)   Overall Financial Resource Strain (CARDIA)    Difficulty of Paying Living Expenses: Not very hard  Food Insecurity: No Food Insecurity (05/18/2022)   Hunger Vital Sign    Worried About Running Out of Food in the Last Year: Never true    Ran Out of Food in the Last Year: Never true  Transportation Needs: No Transportation Needs (05/18/2022)   PRAPARE - Administrator, Civil Service (Medical): No    Lack of Transportation (Non-Medical): No  Physical Activity: Insufficiently Active (05/18/2022)   Exercise Vital Sign    Days of Exercise per Week: 4 days    Minutes of Exercise per Session: 30  min  Stress: No Stress Concern Present (05/18/2022)   [employer] of Occupational Health - Occupational Stress Questionnaire    Feeling of Stress : Not at all  Social Connections: Moderately Integrated (05/18/2022)   Social Connection and Isolation Panel [NHANES]    Frequency of Communication with Friends and Family: More than three times a week    Frequency of Social Gatherings with Friends and Family:  Twice a week    Attends Religious Services: 1 to 4 times per year    Active Member of Clubs or Organizations: Yes    Attends Banker Meetings: More than 4 times per year    Marital Status: Widowed  Intimate Partner Violence: Not At Risk (05/18/2022)   Humiliation, Afraid, Rape, and Kick questionnaire    Fear of Current or Ex-Partner: No    Emotionally Abused: No    Physically Abused: No    Sexually Abused: No    Family History  Problem Relation Age of Onset   Diabetes Mother        suicide at age 55   Hypertension Father    CVA Father     Past Surgical History:  Procedure Laterality Date   ABDOMINAL HYSTERECTOMY     BIOPSY BREAST     (L) BREAST IN 1993   KNEE ARTHROSCOPY      ROS: Review of Systems Negative except as stated above  PHYSICAL EXAM: BP (!) 155/70 (BP Location: Left Arm, Patient Position: Sitting, Cuff Size: Normal)   Pulse 67   Temp 98.6 F (37 C) (Oral)   Ht 5\' 5"  (1.651 m)   Wt 143 lb (64.9 kg)   SpO2 98%   BMI 23.80 kg/m   Physical Exam Repeat BP  General appearance - alert, well appearing, pleasant elderly female and in no distress.  No audible hoarseness heard at this time. Mental status - alert, oriented to person, place, and time Nose - normal and patent, no erythema, discharge or polyps Mouth - mucous membranes moist, pharynx normal without lesions Neck - supple, no significant adenopathy Chest - clear to auscultation, no wheezes, rales or rhonchi, symmetric air entry Heart -sounds to be in regular sinus rhythm at this  time. Extremities -no lower extremity edema.  Wearing compression socks. Skin -she has some areas of resolving ecchymosis and senile purpura on the forearms.      Latest Ref Rng & Units 08/01/2022    9:18 AM 03/05/2022    2:14 PM 09/05/2021    3:31 PM  CMP  Glucose 70 - 99 mg/dL 540  99  98   BUN 8 - 27 mg/dL 17  16  21    Creatinine 0.57 - 1.00 mg/dL 9.81  1.91  4.78   Sodium 134 - 144 mmol/L 141  139  144   Potassium 3.5 - 5.2 mmol/L 3.7  4.5  4.4   Chloride 96 - 106 mmol/L 102  100  103   CO2 20 - 29 mmol/L 26  27  22    Calcium 8.7 - 10.3 mg/dL 9.2  9.8  9.5   Total Protein 6.0 - 8.5 g/dL 6.6     Total Bilirubin 0.0 - 1.2 mg/dL 0.5     Alkaline Phos 44 - 121 IU/L 50     AST 0 - 40 IU/L 23     ALT 0 - 32 IU/L 23      Lipid Panel     Component Value Date/Time   CHOL 164 08/01/2022 0918   TRIG 72 08/01/2022 0918   HDL 76 08/01/2022 0918   CHOLHDL 2.2 08/01/2022 0918   CHOLHDL 3.2 09/02/2020 1035   VLDL 28 01/14/2017 1114   LDLCALC 74 08/01/2022 0918   LDLCALC 99 09/02/2020 1035    CBC    Component Value Date/Time   WBC 8.5 08/01/2022 0918   WBC 6.9 05/24/2021 0808   WBC 7.4 12/01/2020 1615  RBC 4.25 08/01/2022 0918   RBC 4.06 05/24/2021 0808   HGB 14.0 08/01/2022 0918   HCT 41.5 08/01/2022 0918   PLT 206 08/01/2022 0918   MCV 98 (H) 08/01/2022 0918   MCH 32.9 08/01/2022 0918   MCH 33.0 05/24/2021 0808   MCHC 33.7 08/01/2022 0918   MCHC 33.8 05/24/2021 0808   RDW 13.4 08/01/2022 0918   LYMPHSABS 2.4 05/24/2021 0808   MONOABS 0.5 05/24/2021 0808   EOSABS 0.1 05/24/2021 0808   BASOSABS 0.1 05/24/2021 0808    ASSESSMENT AND PLAN:  1. Controlled type 2 diabetes with neuropathy (HCC) At goal.  Diet controlled.  Continue healthy eating and try to move as much as she can We discussed trying her with gabapentin for her neuropathy symptoms but patient declines. She could not remember the name - POCT glucose (manual entry) - POCT glycosylated hemoglobin (Hb A1C) -  Microalbumin / creatinine urine ratio  2. Hypertension associated with diabetes (HCC) White coat syndrome with diagnosis of hypertension -Blood pressure readings good at home but always elevated in the office.  I think she has a component of whitecoat hypertension.  Continue current dose of diltiazem and Cozaar.  Continue to limit salt in the foods.  3. PAF (paroxysmal atrial fibrillation) (HCC) On Eliquis and Cardizem  4. Seasonal allergic rhinitis due to pollen Okay to continue Claritin as needed.  5. Hoarse Voice does not sound hoarse at this time.  I have offered referral to ENT.  However patient wants to hold off.  She states that if it becomes more frequent she will let me know  6. Senile purpura (HCC) Due to being on Eliquis.    Patient was given the opportunity to ask questions.  Patient verbalized understanding of the plan and was able to repeat key elements of the plan.   This documentation was completed using Paediatric nurse.  Any transcriptional errors are unintentional.  Orders Placed This Encounter  Procedures   POCT glucose (manual entry)   POCT glycosylated hemoglobin (Hb A1C)     Requested Prescriptions    No prescriptions requested or ordered in this encounter    No follow-ups on file.  Jonah Blue, MD, FACP

## 2023-01-08 LAB — MICROALBUMIN / CREATININE URINE RATIO
Creatinine, Urine: 18.9 mg/dL
Microalb/Creat Ratio: <16 mg/g creat (ref 0–29)
Microalbumin, Urine: <3 ug/mL

## 2023-01-09 ENCOUNTER — Other Ambulatory Visit: Payer: Self-pay | Admitting: Physician Assistant

## 2023-01-15 ENCOUNTER — Other Ambulatory Visit (HOSPITAL_COMMUNITY): Payer: Self-pay | Admitting: Physician Assistant

## 2023-01-15 ENCOUNTER — Encounter (HOSPITAL_COMMUNITY): Payer: Self-pay | Admitting: Physician Assistant

## 2023-01-15 ENCOUNTER — Ambulatory Visit (HOSPITAL_COMMUNITY)
Admission: RE | Admit: 2023-01-15 | Discharge: 2023-01-15 | Disposition: A | Discharge status: Home / Self Care | Payer: Medicare Other | Source: Ambulatory Visit | Attending: Physician Assistant | Admitting: Physician Assistant

## 2023-01-15 VITALS — BP 142/78 | HR 73 | Ht 65.0 in | Wt 144.6 lb

## 2023-01-15 DIAGNOSIS — D6869 Other thrombophilia: Secondary | ICD-10-CM | POA: Diagnosis not present

## 2023-01-15 DIAGNOSIS — Z79899 Other long term (current) drug therapy: Secondary | ICD-10-CM | POA: Insufficient documentation

## 2023-01-15 DIAGNOSIS — Z7901 Long term (current) use of anticoagulants: Secondary | ICD-10-CM | POA: Insufficient documentation

## 2023-01-15 DIAGNOSIS — E785 Hyperlipidemia, unspecified: Secondary | ICD-10-CM | POA: Insufficient documentation

## 2023-01-15 DIAGNOSIS — E119 Type 2 diabetes mellitus without complications: Secondary | ICD-10-CM | POA: Insufficient documentation

## 2023-01-15 DIAGNOSIS — I10 Essential (primary) hypertension: Secondary | ICD-10-CM | POA: Insufficient documentation

## 2023-01-15 DIAGNOSIS — I480 Paroxysmal atrial fibrillation: Secondary | ICD-10-CM

## 2023-01-15 NOTE — Progress Notes (Signed)
Primary Care Physician: Marcine Matar, MD Primary Cardiologist: Dr Bjorn Pippin  Primary Electrophysiologist: none Referring Physician: Dr Dahlia Bailiff is a 88 y.o. female with a history of T2DM, hypertension, hyperlipidemia, gout , atrial fibrillation who presents for follow up in [facility]. Ruth Delgado was initially diagnosed with atrial fibrillation remotely. More recently Ruth Delgado presented to Ruth ED with an acute GI illness. ECG showed afib with RVR. Ruth Delgado converted to SR spontaneously. Delgado is on Eliquis for a CHADS2VASC score of 5. Ruth Delgado was seen by Dr Bjorn Pippin 11/28/20 and a Zio monitor was placed to evaluate arrhythmia burden. On 12/08/20 Ruth Delgado called Ruth clinic with elevated heart rates. Ruth Delgado reports Ruth episode lasted about one hour. Ruth cardiac monitor showed 4% afib burden with RVR.   On follow up today, Delgado reports that Ruth Delgado has done well since her last visit. Ruth Delgado has rare palpitations that only last 1-2 seconds. No sustained episodes. No significant bleeding issues on anticoagulation.   Today, Ruth Delgado denies symptoms of palpitations, chest pain, shortness of breath, orthopnea, PND, lower extremity edema, dizziness, presyncope, syncope, snoring, daytime somnolence, bleeding, or neurologic sequela. Ruth Delgado is tolerating medications without difficulties and is otherwise without complaint today.    Atrial Fibrillation Risk Factors:  Ruth Delgado does not have symptoms or diagnosis of sleep apnea. Ruth Delgado does not have a history of rheumatic fever.   Ruth Delgado has a BMI of Body mass index is 24.06 kg/m.Marland Kitchen Filed Weights   01/15/23 1114  Weight: 65.6 kg    Atrial Fibrillation Management history:  Previous antiarrhythmic drugs: none Previous cardioversions: none Previous ablations: none Anticoagulation history: Eliquis   Past Medical History:  Diagnosis Date   A-fib (HCC)    Anxiety    Arthritis    Breast cancer (HCC)    Diabetes mellitus     Gout    Hyperlipemia    Hypertension    Past Surgical History:  Procedure Laterality Date   ABDOMINAL HYSTERECTOMY     BIOPSY BREAST     (L) BREAST IN 1993   KNEE ARTHROSCOPY      ROS- All systems are reviewed and negative except as per Ruth HPI above.  Physical Exam: Vitals:   01/15/23 1114  BP: (!) 142/78  Pulse: 73  Weight: 65.6 kg  Height: 5\' 5"  (1.651 m)    GEN- Ruth Delgado is a well appearing elderly female, alert and oriented x 3 today.   HEENT-head normocephalic, atraumatic, sclera clear, conjunctiva pink, hearing intact, trachea midline. Lungs- Clear to ausculation bilaterally, normal work of breathing Heart- Regular rate and rhythm, no murmurs, rubs or gallops  GI- soft, NT, ND, + BS Extremities- no clubbing, cyanosis, or edema MS- no significant deformity or atrophy Skin- no rash or lesion Psych- euthymic mood, full affect Neuro- strength and sensation are intact   Wt Readings from Last 3 Encounters:  01/15/23 65.6 kg  01/07/23 64.9 kg  09/06/22 64.9 kg    EKG today demonstrates  SR Vent. rate 73 BPM PR interval 152 ms QRS duration 78 ms QT/QTcB 388/427 ms  Echo 12/28/20 demonstrated  1. Left ventricular ejection fraction, by estimation, is 65 to 70%. Ruth  left ventricle has normal function. Ruth left ventricle has no regional  wall motion abnormalities. There is mild concentric left ventricular  hypertrophy. Left ventricular diastolic parameters are indeterminate.   2. Right ventricular systolic function is normal. Ruth right ventricular  size is normal. There is normal pulmonary  artery systolic pressure. Ruth estimated right ventricular systolic pressure is 35.7 mmHg.   3. Ruth mitral valve is degenerative. No evidence of mitral valve  regurgitation. No evidence of mitral stenosis.   4. Ruth aortic valve is tricuspid. Aortic valve regurgitation is not  visualized. No aortic stenosis is present.  Epic records are reviewed at length today  CHA2DS2-VASc  Score = 5  Ruth Delgado's score is based upon: CHF History: 0 HTN History: 1 Diabetes History: 1 Stroke History: 0 Vascular Disease History: 0 Age Score: 2 Gender Score: 1         ASSESSMENT AND PLAN: 1. Paroxysmal Atrial Fibrillation (ICD10:  I48.0) Ruth Delgado's CHA2DS2-VASc score is 5, indicating a 7.2% annual risk of stroke.   Delgado appears to be maintaining SR with only very brief palpitations. Ruth Delgado is happy with her present treatment.  Continue diltiazem 360 mg daily Continue Eliquis 5 mg BID  2. Secondary Hypercoagulable State (ICD10:  D68.69) Ruth Delgado is at significant risk for stroke/thromboembolism based upon her CHA2DS2-VASc Score of 5.  Continue Apixaban (Eliquis).   3. HTN Stable, no changes today.   Follow up with Dr Bjorn Pippin as scheduled. AF clinic as needed.    Jorja Loa PA-C Afib Clinic [facility] [address] [place], [place] [address] [phone] 01/15/2023 11:44 AM

## 2023-01-28 ENCOUNTER — Other Ambulatory Visit: Payer: Self-pay | Admitting: Internal Medicine

## 2023-01-29 NOTE — Telephone Encounter (Signed)
Requested Prescriptions  Pending Prescriptions Disp Refills   citalopram (CELEXA) 20 MG tablet [Pharmacy Med Name: CITALOPRAM HBR 20 MG TABLET 20 Tablet] 90 tablet 0    Sig: TAKE 1 TABLET (20 MG TOTAL) BY MOUTH DAILY.     Psychiatry:  Antidepressants - SSRI Passed - 01/28/2023 12:46 PM      Passed - Valid encounter within last 6 months    Recent Outpatient Visits           3 weeks ago Controlled type 2 diabetes with neuropathy [facility])   [place] [facility] Jonah Blue B, MD   4 months ago Controlled type 2 diabetes with neuropathy [facility])   [place] [facility] Marcine Matar, MD   8 months ago Encounter for [employer] annual wellness exam   [facility] Jonah Blue B, MD   9 months ago Controlled type 2 diabetes with neuropathy [facility])   [facility] Marcine Matar, MD   11 months ago Encounter for [employer] annual wellness exam   [facility] Drucilla Chalet, RPH-CPP       Future Appointments             In 2 weeks Little Ishikawa, MD [facility]   In 3 months Laural Benes, Binnie Rail, MD [facility]

## 2023-01-30 ENCOUNTER — Telehealth: Payer: Self-pay

## 2023-01-30 NOTE — Patient Outreach (Signed)
  Care Coordination   01/30/2023 Name: Ruth Delgado MRN: [id number] DOB: 12-Aug-1934   Care Coordination Outreach Attempts:  An unsuccessful telephone outreach was attempted today to offer the patient information about available care coordination services.  Follow Up Plan:  Additional outreach attempts will be made to offer the patient care coordination information and services.   Encounter Outcome:  No Answer   Care Coordination Interventions:  No, not indicated    Bevelyn Ngo, BSW, CDP Social Worker, Certified Dementia Practitioner [facility] Care Management  Care Coordination [phone]

## 2023-02-04 ENCOUNTER — Ambulatory Visit: Payer: Self-pay

## 2023-02-04 ENCOUNTER — Telehealth: Payer: Self-pay

## 2023-02-04 NOTE — Patient Instructions (Signed)
Visit Information  Thank you for taking time to visit with me today. Please don't hesitate to contact me if I can be of assistance to you.   Following are the goals we discussed today:  - Contact your primary care providers office as needed   If you are experiencing a Mental Health or Behavioral Health Crisis or need someone to talk to, please go to [facility] [address], [place] [phone]) call 911  The patient verbalized understanding of instructions, educational materials, and care plan provided today and DECLINED offer to receive copy of patient instructions, educational materials, and care plan.   No further follow up required: Please contact me as needed  Bevelyn Ngo, BSW, CDP Social Worker, Certified Dementia Practitioner [facility] Care Management  Care Coordination [phone]

## 2023-02-04 NOTE — Patient Outreach (Signed)
  Care Coordination   Initial Visit Note   02/04/2023 Name: Ruth Delgado MRN: [id number] DOB: May 25, 1935  Ruth Delgado is a 88 y.o. year old female who sees Marcine Matar, MD for primary care. I spoke with  Ruth Delgado by phone today.  What matters to the patients health and wellness today?  No concerns, patient reports she is doing well at home.    Goals Addressed             This Visit's Progress    COMPLETED: Care Coordination Activities       Care Coordination Interventions: SDoH screening performed - no acute resource challenges identified at this time Determined the patient does not have concerns with medication adherence. Patient does express concern for the cost of her medication specifically Eliquis.  Discussed the patient has tried to apply for patient assistance in the past but is over the income limit - she is currently able to afford the medication Education provided on the role of the care coordination team - no engagement desired at this time Encouraged the patient to contact her primary care provider as needed         SDOH assessments and interventions completed:  Yes  SDOH Interventions Today    Flowsheet Row Most Recent Value  SDOH Interventions   Food Insecurity Interventions Intervention Not Indicated  Housing Interventions Intervention Not Indicated  Transportation Interventions Intervention Not Indicated  Utilities Interventions Intervention Not Indicated        Care Coordination Interventions:  Yes, provided   Interventions Today    Flowsheet Row Most Recent Value  Chronic Disease   Chronic disease during today's visit Atrial Fibrillation (AFib), Diabetes, Hypertension (HTN)  General Interventions   General Interventions Discussed/Reviewed General Interventions Discussed, Doctor Visits  Doctor Visits Discussed/Reviewed Doctor Visits Reviewed  Education Interventions   Education Provided Provided Education  [on the role of  the care coordination team]  Nutrition Interventions   Nutrition Discussed/Reviewed Nutrition Discussed  Pharmacy Interventions   Pharmacy Dicussed/Reviewed Pharmacy Topics Discussed  [Pt indicates her Eliquis is high but she is over the income limit for patient assistance]        Follow up plan: No further intervention required.   Encounter Outcome:  Pt. Visit Completed   Bevelyn Ngo, BSW, CDP Social Worker, Certified Dementia Practitioner [facility] Care Management  Care Coordination [phone]

## 2023-02-04 NOTE — Patient Outreach (Signed)
  Care Coordination   02/04/2023 Name: Ruth Delgado MRN: [id number] DOB: [birth year]   Care Coordination Outreach Attempts:  A second unsuccessful outreach was attempted today to offer the patient with information about available care coordination services.  Follow Up Plan:  Additional outreach attempts will be made to offer the patient care coordination information and services.   Encounter Outcome:  No Answer   Care Coordination Interventions:  No, not indicated    Bevelyn Ngo, BSW, CDP Social Worker, Certified Dementia Practitioner [facility] Care Management  Care Coordination [phone]

## 2023-02-09 ENCOUNTER — Other Ambulatory Visit: Payer: Self-pay | Admitting: Internal Medicine

## 2023-02-09 DIAGNOSIS — Z8739 Personal history of other diseases of the musculoskeletal system and connective tissue: Secondary | ICD-10-CM

## 2023-02-11 NOTE — Progress Notes (Signed)
Cardiology Office Note:    Date:  02/14/2023   ID:  Ruth Delgado, DOB 04-Oct-1934, MRN [id number]  PCP:  Marcine Matar, MD  Cardiologist:  Little Ishikawa, MD  Electrophysiologist:  None   Referring MD: Marcine Matar, MD   Chief Complaint  Patient presents with   Atrial Fibrillation    History of Present Illness:    Ruth Delgado is a 88 y.o. female with a hx of atrial fibrillation, T2DM, hypertension, hyperlipidemia, gout who is referred by Dr. Oneta Rack for evaluation of atrial fibrillation.  She was admitted to [facility] from 4/19 through 11/18/2020.  She presented with sudden onset nausea/vomiting/diarrhea.  On the ED she went into A. fib with RVR and was started on diltiazem drip.  This was transitioned to p.o. diltiazem and she was started on Eliquis 5 mg twice daily.  Zio patch x2 weeks on 12/22/2020 showed AF burden 4%, average rate 150, with longest episode lasting 2 hours 46 minutes.  Echocardiogram 12/28/2020 showed normal biventricular function, no significant valvular disease.  Since last clinic visit, she reports that she is doing well.  Does report some shortness of breath in the morning.  But she remains very active, walks dog daily for 30 minutes and does yard work, denies any exertional chest pain or dyspnea.  Denies any lightheadedness, syncope.  Does report occasional swelling in her right leg.  She is taking Lasix.  She denies any palpitations.  Reports compliance with Eliquis, denies any bleeding or falls.   Past Medical History:  Diagnosis Date   A-fib (HCC)    Anxiety    Arthritis    Breast cancer (HCC)    Diabetes mellitus    Gout    Hyperlipemia    Hypertension     Past Surgical History:  Procedure Laterality Date   ABDOMINAL HYSTERECTOMY     BIOPSY BREAST     (L) BREAST IN 1993   KNEE ARTHROSCOPY      Current Medications: Current Meds  Medication Sig   acetaminophen (TYLENOL) 650 MG CR tablet Take 650 mg by mouth every 8 (eight)  hours as needed for pain.   allopurinol (ZYLOPRIM) 300 MG tablet TAKE 1 TABLET BY MOUTH DAILY TO PREVENT GOUT   apixaban (ELIQUIS) 5 MG TABS tablet Take 1 tablet (5 mg total) by mouth 2 (two) times daily.   cholecalciferol (VITAMIN D) 1000 UNITS tablet Take 5,000 Units by mouth every other day.    citalopram (CELEXA) 20 MG tablet TAKE 1 TABLET (20 MG TOTAL) BY MOUTH DAILY.   diltiazem (CARDIZEM CD) 360 MG 24 hr capsule TAKE 1 CAPSULE (360 MG TOTAL) BY MOUTH DAILY.   furosemide (LASIX) 20 MG tablet TAKE 1 TABLET (20 MG TOTAL) BY MOUTH DAILY.   Lancets (FREESTYLE) lancets    letrozole (FEMARA) 2.5 MG tablet TAKE 1 TABLET (2.5 MG TOTAL) BY MOUTH DAILY.   losartan (COZAAR) 50 MG tablet TAKE 1 TABLET (50 MG TOTAL) BY MOUTH DAILY.   pravastatin (PRAVACHOL) 40 MG tablet TAKE 1 TABLET (40 MG TOTAL) BY MOUTH DAILY.   Simethicone (GAS-X PO) Take 2 tablets by mouth daily as needed (bloating).     Allergies:   Buspar [buspirone], Lipitor [atorvastatin], Metformin and related, Metformin hcl, Nsaids, Sulfa antibiotics, Sulfacetamide sodium-sulfur, and Sulfonamide derivatives   Social History   Socioeconomic History   Marital status: Widowed    Spouse name: Not on file   Number of children: Not on file   Years of education:  Not on file   Highest education level: Not on file  Occupational History   Occupation: retired  Tobacco Use   Smoking status: Former    Current packs/day: 0.00    Average packs/day: 0.5 packs/day for 25.0 years (12.5 ttl pk-yrs)    Types: Cigarettes    Start date: 07/30/1981    Quit date: 07/30/2006    Years since quitting: 16.5   Smokeless tobacco: Never   Tobacco comments:    Former smoker 07/04/2021  Substance and Sexual Activity   Alcohol use: Not Currently    Alcohol/week: 2.0 standard drinks of alcohol    Types: 2 Standard drinks or equivalent per week    Comment: occasional, none in 3 months   Drug use: Never   Sexual activity: Not on file  Other Topics Concern    Not on file  Social History Narrative   Not on file   Social Determinants of Health   Financial Resource Strain: Low Risk  (05/24/2021)   Overall Financial Resource Strain (CARDIA)    Difficulty of Paying Living Expenses: Not very hard  Food Insecurity: No Food Insecurity (02/04/2023)   Hunger Vital Sign    Worried About Running Out of Food in the Last Year: Never true    Ran Out of Food in the Last Year: Never true  Transportation Needs: No Transportation Needs (02/04/2023)   PRAPARE - Administrator, Civil Service (Medical): No    Lack of Transportation (Non-Medical): No  Physical Activity: Insufficiently Active (05/18/2022)   Exercise Vital Sign    Days of Exercise per Week: 4 days    Minutes of Exercise per Session: 30 min  Stress: No Stress Concern Present (05/18/2022)   [employer] of Occupational Health - Occupational Stress Questionnaire    Feeling of Stress : Not at all  Social Connections: Moderately Integrated (05/18/2022)   Social Connection and Isolation Panel [NHANES]    Frequency of Communication with Friends and Family: More than three times a week    Frequency of Social Gatherings with Friends and Family: Twice a week    Attends Religious Services: 1 to 4 times per year    Active Member of [employer] or Organizations: Yes    Attends Banker Meetings: More than 4 times per year    Marital Status: Widowed     Family History: The patient's family history includes CVA in her father; Diabetes in her mother; Hypertension in her father.  ROS:   Please see the history of present illness.  All other systems reviewed and are negative.  EKGs/Labs/Other Studies Reviewed:    The following studies were reviewed today:   EKG:   02/14/2023: Normal sinus rhythm, rate 65, poor R wave progression, nonspecific T wave flattening 02/28/21:NSR, rate 67, No ST abnormalities 11/28/2020: Normal Sinus Rhythm. Rate 62 bpm. No ST abnormalities   Recent  Labs: 08/01/2022: ALT 23; BUN 17; Creatinine, Ser 0.85; Hemoglobin 14.0; Magnesium 2.0; Platelets 206; Potassium 3.7; Sodium 141  Recent Lipid Panel    Component Value Date/Time   CHOL 164 08/01/2022 0918   TRIG 72 08/01/2022 0918   HDL 76 08/01/2022 0918   CHOLHDL 2.2 08/01/2022 0918   CHOLHDL 3.2 09/02/2020 1035   VLDL 28 01/14/2017 1114   LDLCALC 74 08/01/2022 0918   LDLCALC 99 09/02/2020 1035    Physical Exam:    VS:  BP 132/68 (BP Location: Left Arm, Patient Position: Sitting, Cuff Size: Normal)   Pulse 65  Ht 5\' 5"  (1.651 m)   Wt 143 lb (64.9 kg)   SpO2 94%   BMI 23.80 kg/m     Wt Readings from Last 3 Encounters:  02/14/23 143 lb (64.9 kg)  01/15/23 144 lb 9.6 oz (65.6 kg)  01/07/23 143 lb (64.9 kg)     GEN: Well nourished, well developed in no acute distress HEENT: Normal NECK: No JVD; No carotid bruits LYMPHATICS: No lymphadenopathy CARDIAC: RRR, no murmurs, rubs, gallops RESPIRATORY:  Clear to auscultation without rales, wheezing or rhonchi  ABDOMEN: Soft, non-tender, non-distended MUSCULOSKELETAL:  compression stockings in place SKIN: Warm and dry NEUROLOGIC:  Alert and oriented x 3 PSYCHIATRIC:  Normal affect   ASSESSMENT:    1. Paroxysmal atrial fibrillation (HCC)   2. Primary hypertension   3. Chronic diastolic heart failure (HCC)   4. Hyperlipidemia, unspecified hyperlipidemia type     PLAN:    Atrial fibrillation: Diagnosed during admission with gastroenteritis 10/2020.  CHA2DS2-VASc score 5 (hypertension, age x2, diabetes, female).  Zio patch x2 weeks on 12/22/2020 showed AF burden 4%, average rate 150, with longest episode lasting 2 hours 46 minutes.  Echocardiogram 12/28/2020 showed normal biventricular function, no significant valvular disease. -Continue diltiazem 360 mg daily -Continue Eliquis 5 mg twice daily.  No recent falls or bleeding.  Check CBC  Chronic diastolic heart failure: On Lasix 20 mg daily.  Appears euvolemic.  Check  BMET/magnesium  Hypertension: Currently on diltiazem 360 mg daily and losartan 50 mg daily.  Appears controlled  Hyperlipidemia: On pravastatin 40 mg daily.  LDL 74 on 08/01/2022  Hypokalemia: Potassium 2.6 on 11/18/2020.  Likely due to vomiting and diarrhea in the setting of Lasix use.  Lasix discontinued but was restarted, potassium has since been normal.  Check BMET  T2DM: A1c down to 5.9% in 12/2022.  Currently diet controlled.   RTC in 6 months   Medication Adjustments/Labs and Tests Ordered: Current medicines are reviewed at length with the patient today.  Concerns regarding medicines are outlined above.  Orders Placed This Encounter  Procedures   Basic Metabolic Panel (BMET)   CBC   Magnesium   EKG 12-Lead    No orders of the defined types were placed in this encounter.    Patient Instructions  Medication Instructions:  - No changes  *If you need a refill on your cardiac medications before your next appointment, please call your pharmacy*   Lab Work: - BMET, Magnesium, and CBC ordered today  If you have labs (blood work) drawn today and your tests are completely normal, you will receive your results only by: MyChart Message (if you have MyChart) OR A paper copy in the mail If you have any lab test that is abnormal or we need to change your treatment, we will call you to review the results.   Testing/Procedures: - None ordered   Follow-Up: At [facility], you and your health needs are our priority.  As part of our continuing mission to provide you with exceptional heart care, we have created designated Provider Care Teams.  These Care Teams include your primary Cardiologist (physician) and Advanced Practice Providers (APPs -  Physician Assistants and Nurse Practitioners) who all work together to provide you with the care you need, when you need it.  We recommend signing up for the patient portal called "MyChart".  Sign up information is provided on this  After Visit Summary.  MyChart is used to connect with patients for Virtual Visits (Telemedicine).  Patients are  able to view lab/test results, encounter notes, upcoming appointments, etc.  Non-urgent messages can be sent to your provider as well.   To learn more about what you can do with MyChart, go to [email or web address].    Your next appointment:   6 month(s)  Provider:   Little Ishikawa, MD        Signed, Little Ishikawa, MD  02/14/2023 2:15 PM    [facility]

## 2023-02-11 NOTE — Telephone Encounter (Signed)
Requested medications are due for refill today.  yes  Requested medications are on the active medications list.  yes  Last refill. 01/27/2023 #30 0 rf  Future visit scheduled.   yes  Notes to clinic.  Labs are expired.    Requested Prescriptions  Pending Prescriptions Disp Refills   allopurinol (ZYLOPRIM) 300 MG tablet [Pharmacy Med Name: ALLOPURINOL 300 MG TABS 300 Tablet] 30 tablet 0    Sig: TAKE 1 TABLET BY MOUTH DAILY TO PREVENT GOUT     Endocrinology:  Gout Agents - allopurinol Failed - 02/09/2023 10:28 AM      Failed - Uric Acid in normal range and within 360 days    Uric Acid, Serum  Date Value Ref Range Status  09/02/2020 3.8 2.5 - 7.0 mg/dL Final    Comment:    Therapeutic target for gout patients: <6.0 mg/dL .          Failed - CBC within normal limits and completed in the last 12 months    WBC  Date Value Ref Range Status  08/01/2022 8.5 3.4 - 10.8 x10E3/uL Final  12/01/2020 7.4 3.8 - 10.8 Thousand/uL Final   WBC Count  Date Value Ref Range Status  05/24/2021 6.9 4.0 - 10.5 K/uL Final   RBC  Date Value Ref Range Status  08/01/2022 4.25 3.77 - 5.28 x10E6/uL Final  05/24/2021 4.06 3.87 - 5.11 MIL/uL Final   Hemoglobin  Date Value Ref Range Status  08/01/2022 14.0 11.1 - 15.9 g/dL Final   Hematocrit  Date Value Ref Range Status  08/01/2022 41.5 34.0 - 46.6 % Final   MCHC  Date Value Ref Range Status  08/01/2022 33.7 31.5 - 35.7 g/dL Final  40/98/1191 47.8 30.0 - 36.0 g/dL Final   [facility]  Date Value Ref Range Status  08/01/2022 32.9 26.6 - 33.0 pg Final  05/24/2021 33.0 26.0 - 34.0 pg Final   MCV  Date Value Ref Range Status  08/01/2022 98 (H) 79 - 97 fL Final   No results found for: "PLTCOUNTKUC", "LABPLAT", "POCPLA" RDW  Date Value Ref Range Status  08/01/2022 13.4 11.7 - 15.4 % Final         Passed - Cr in normal range and within 360 days    Creatinine  Date Value Ref Range Status  05/24/2021 0.86 0.44 - 1.00 mg/dL Final   Creat   Date Value Ref Range Status  12/01/2020 0.80 0.60 - 0.88 mg/dL Final    Comment:    For patients >23 years of age, the reference limit for Creatinine is approximately 13% higher for people identified as African-American. .    Creatinine, Ser  Date Value Ref Range Status  08/01/2022 0.85 0.57 - 1.00 mg/dL Final   Creatinine, Urine  Date Value Ref Range Status  02/11/2020 104 20 - 275 mg/dL Final         Passed - Valid encounter within last 12 months    Recent Outpatient Visits           1 month ago Controlled type 2 diabetes with neuropathy [facility])   [facility] Jonah Blue B, MD   5 months ago Controlled type 2 diabetes with neuropathy [facility])   [facility] Marcine Matar, MD   8 months ago Encounter for [employer] annual wellness exam   [facility] Jonah Blue B, MD   9 months ago Controlled type 2 diabetes with  neuropathy [facility])   [facility] Marcine Matar, MD   1 year ago Encounter for [employer] annual wellness exam   [facility] Lois Huxley, Cornelius Moras, RPH-CPP       Future Appointments             In 3 days Little Ishikawa, MD [facility]   In 2 months Laural Benes, Binnie Rail, MD [facility]

## 2023-02-14 ENCOUNTER — Ambulatory Visit: Payer: Medicare Other | Attending: Cardiology | Admitting: Cardiology

## 2023-02-14 ENCOUNTER — Encounter: Payer: Self-pay | Admitting: Cardiology

## 2023-02-14 VITALS — BP 132/68 | HR 65 | Ht 65.0 in | Wt 143.0 lb

## 2023-02-14 DIAGNOSIS — I10 Essential (primary) hypertension: Secondary | ICD-10-CM

## 2023-02-14 DIAGNOSIS — E785 Hyperlipidemia, unspecified: Secondary | ICD-10-CM

## 2023-02-14 DIAGNOSIS — I5032 Chronic diastolic (congestive) heart failure: Secondary | ICD-10-CM

## 2023-02-14 DIAGNOSIS — I480 Paroxysmal atrial fibrillation: Secondary | ICD-10-CM | POA: Diagnosis not present

## 2023-02-14 NOTE — Patient Instructions (Signed)
Medication Instructions:  - No changes  *If you need a refill on your cardiac medications before your next appointment, please call your pharmacy*   Lab Work: - BMET, Magnesium, and CBC ordered today  If you have labs (blood work) drawn today and your tests are completely normal, you will receive your results only by: MyChart Message (if you have MyChart) OR A paper copy in the mail If you have any lab test that is abnormal or we need to change your treatment, we will call you to review the results.   Testing/Procedures: - None ordered   Follow-Up: At [facility], you and your health needs are our priority.  As part of our continuing mission to provide you with exceptional heart care, we have created designated Provider Care Teams.  These Care Teams include your primary Cardiologist (physician) and Advanced Practice Providers (APPs -  Physician Assistants and Nurse Practitioners) who all work together to provide you with the care you need, when you need it.  We recommend signing up for the patient portal called "MyChart".  Sign up information is provided on this After Visit Summary.  MyChart is used to connect with patients for Virtual Visits (Telemedicine).  Patients are able to view lab/test results, encounter notes, upcoming appointments, etc.  Non-urgent messages can be sent to your provider as well.   To learn more about what you can do with MyChart, go to [email or web address].    Your next appointment:   6 month(s)  Provider:   Little Ishikawa, MD

## 2023-02-15 LAB — BASIC METABOLIC PANEL
BUN/Creatinine Ratio: 18 (ref 12–28)
BUN: 13 mg/dL (ref 8–27)
CO2: 26 mmol/L (ref 20–29)
Calcium: 9 mg/dL (ref 8.7–10.3)
Chloride: 101 mmol/L (ref 96–106)
Creatinine, Ser: 0.74 mg/dL (ref 0.57–1.00)
Glucose: 106 mg/dL — ABNORMAL HIGH (ref 70–99)
Potassium: 3.9 mmol/L (ref 3.5–5.2)
Sodium: 141 mmol/L (ref 134–144)
eGFR: 79 mL/min/{1.73_m2} (ref >59)

## 2023-02-15 LAB — CBC
Hematocrit: 38.6 % (ref 34.0–46.6)
Hemoglobin: 13.2 g/dL (ref 11.1–15.9)
MCH: 32.9 pg (ref 26.6–33.0)
MCHC: 34.2 g/dL (ref 31.5–35.7)
MCV: 96 fL (ref 79–97)
Platelets: 192 10*3/uL (ref 150–450)
RBC: 4.01 x10E6/uL (ref 3.77–5.28)
RDW: 12.4 % (ref 11.7–15.4)
WBC: 6.5 10*3/uL (ref 3.4–10.8)

## 2023-02-15 LAB — MAGNESIUM: Magnesium: 2.1 mg/dL (ref 1.6–2.3)

## 2023-02-18 ENCOUNTER — Encounter: Payer: Self-pay | Admitting: *Deleted

## 2023-03-22 ENCOUNTER — Other Ambulatory Visit: Payer: Self-pay | Admitting: Cardiology

## 2023-04-01 NOTE — Patient Instructions (Incomplete)
Ruth Delgado , Thank you for taking time to come for your Medicare Wellness Visit. I appreciate your ongoing commitment to your health goals. Please review the following plan we discussed and let me know if I can assist you in the future.   Referrals/Orders/Follow-Ups/Clinician Recommendations: Aim for 30 minutes of exercise or brisk walking, 6-8 glasses of water, and 5 servings of fruits and vegetables each day.  This is a list of the screening recommended for you and due dates:  Health Maintenance  Topic Date Due   Complete foot exam   02/09/2021   Flu Shot  02/28/2023   COVID-19 Vaccine (4 - 2023-24 season) 03/31/2023   Pneumonia Vaccine (1 of 1 - PCV) 09/07/2023*   Mammogram  05/15/2023   Hemoglobin A1C  07/09/2023   Eye exam for diabetics  10/31/2023   Medicare Annual Wellness Visit  04/01/2024   DTaP/Tdap/Td vaccine (3 - Tdap) 11/13/2027   DEXA scan (bone density measurement)  Completed   Zoster (Shingles) Vaccine  Completed   HPV Vaccine  Aged Out  *Topic was postponed. The date shown is not the original due date.    Advanced directives: (ACP Link)Information on Advanced Care Planning can be found at [facility] Advance Health Care Directives Advance Health Care Directives ([email or web address]   Next Medicare Annual Wellness Visit scheduled for next year: Yes

## 2023-04-02 ENCOUNTER — Ambulatory Visit: Payer: Medicare Other | Attending: Internal Medicine

## 2023-04-02 VITALS — Ht 65.0 in | Wt 143.0 lb

## 2023-04-02 DIAGNOSIS — Z0000 Encounter for general adult medical examination without abnormal findings: Secondary | ICD-10-CM | POA: Diagnosis not present

## 2023-04-02 NOTE — Progress Notes (Signed)
Subjective:   Ruth Delgado is a 88 y.o. female who presents for Medicare Annual (Subsequent) preventive examination.  Visit Complete: Virtual  I connected with  [name] on 04/02/23 by a audio enabled telemedicine application and verified that I am speaking with the correct person using two identifiers.  Patient Location: Home  Provider Location: Home Office  I discussed the limitations of evaluation and management by telemedicine. The patient expressed understanding and agreed to proceed.  Vital Signs: Because this visit was a virtual/telehealth visit, some criteria may be missing or patient reported. Any vitals not documented were not able to be obtained and vitals that have been documented are patient reported.   Review of Systems     Cardiac Risk Factors include: advanced age (>89men, >49 women);dyslipidemia;hypertension;obesity (BMI >30kg/m2)     Objective:    Today's Vitals   04/02/23 1337  Weight: 143 lb (64.9 kg)  Height: 5\' 5"  (1.651 m)   Body mass index is 23.8 kg/m.     04/02/2023    1:59 PM 05/18/2022   11:06 AM 02/09/2022   11:27 AM 08/30/2021    1:32 PM 11/15/2020    4:59 PM 05/30/2020   12:14 PM 04/08/2019   11:53 AM  Advanced Directives  Does Patient Have a Medical Advance Directive? No Yes Yes Yes No Yes No;Yes  Type of Best boy of [place];Living will Healthcare Power of [place];Living will  Healthcare Power of [place];Living will Healthcare Power of [place];Living will  Does patient want to make changes to medical advance directive?  Yes (Inpatient - patient requests chaplain consult to change a medical advance directive) No - Patient declined No - Patient declined  No - Patient declined No - Patient declined  Copy of Healthcare Power of Attorney in Chart?   No - copy requested No - copy requested     Would patient like information on creating a medical advance directive? Yes (MAU/Ambulatory/Procedural Areas -  Information given)    No - Patient declined No - Patient declined     Current Medications (verified) Outpatient Encounter Medications as of 04/02/2023  Medication Sig   acetaminophen (TYLENOL) 650 MG CR tablet Take 650 mg by mouth every 8 (eight) hours as needed for pain.   allopurinol (ZYLOPRIM) 300 MG tablet TAKE 1 TABLET BY MOUTH DAILY TO PREVENT GOUT   apixaban (ELIQUIS) 5 MG TABS tablet Take 1 tablet (5 mg total) by mouth 2 (two) times daily.   cholecalciferol (VITAMIN D) 1000 UNITS tablet Take 5,000 Units by mouth every other day.    citalopram (CELEXA) 20 MG tablet TAKE 1 TABLET (20 MG TOTAL) BY MOUTH DAILY.   diltiazem (CARDIZEM CD) 360 MG 24 hr capsule TAKE 1 CAPSULE (360 MG TOTAL) BY MOUTH DAILY.   furosemide (LASIX) 20 MG tablet TAKE 1 TABLET (20 MG TOTAL) BY MOUTH DAILY.   Lancets (FREESTYLE) lancets    letrozole (FEMARA) 2.5 MG tablet TAKE 1 TABLET (2.5 MG TOTAL) BY MOUTH DAILY.   losartan (COZAAR) 50 MG tablet TAKE 1 TABLET (50 MG TOTAL) BY MOUTH DAILY.   pravastatin (PRAVACHOL) 40 MG tablet TAKE 1 TABLET (40 MG TOTAL) BY MOUTH DAILY.   Simethicone (GAS-X PO) Take 2 tablets by mouth daily as needed (bloating).   No facility-administered encounter medications on file as of 04/02/2023.    Allergies (verified) Buspar [buspirone], Lipitor [atorvastatin], Metformin and related, Metformin hcl, Nsaids, Sulfa antibiotics, Sulfacetamide sodium-sulfur, and Sulfonamide derivatives   History: Past Medical  History:  Diagnosis Date   A-fib (HCC)    Anxiety    Arthritis    Breast cancer (HCC)    Diabetes mellitus    Gout    Hyperlipemia    Hypertension    Past Surgical History:  Procedure Laterality Date   ABDOMINAL HYSTERECTOMY     BIOPSY BREAST     (L) BREAST IN 1993   KNEE ARTHROSCOPY     Family History  Problem Relation Age of Onset   Diabetes Mother        suicide at age 92   Hypertension Father    CVA Father    Social History   Socioeconomic History   Marital  status: Widowed    Spouse name: Not on file   Number of children: Not on file   Years of education: Not on file   Highest education level: Not on file  Occupational History   Occupation: retired  Tobacco Use   Smoking status: Former    Current packs/day: 0.00    Average packs/day: 0.5 packs/day for 25.0 years (12.5 ttl pk-yrs)    Types: Cigarettes    Start date: 07/30/1981    Quit date: 07/30/2006    Years since quitting: 16.6   Smokeless tobacco: Never   Tobacco comments:    Former smoker 07/04/2021  Substance and Sexual Activity   Alcohol use: Not Currently    Alcohol/week: 2.0 standard drinks of alcohol    Types: 2 Standard drinks or equivalent per week    Comment: occasional, none in 3 months   Drug use: Never   Sexual activity: Not on file  Other Topics Concern   Not on file  Social History Narrative   Not on file   Social Determinants of Health   Financial Resource Strain: Low Risk  (04/02/2023)   Overall Financial Resource Strain (CARDIA)    Difficulty of Paying Living Expenses: Not hard at all  Food Insecurity: No Food Insecurity (04/02/2023)   Hunger Vital Sign    Worried About Running Out of Food in the Last Year: Never true    Ran Out of Food in the Last Year: Never true  Transportation Needs: No Transportation Needs (04/02/2023)   PRAPARE - Administrator, Civil Service (Medical): No    Lack of Transportation (Non-Medical): No  Physical Activity: Sufficiently Active (04/02/2023)   Exercise Vital Sign    Days of Exercise per Week: 5 days    Minutes of Exercise per Session: 30 min  Stress: No Stress Concern Present (04/02/2023)   [employer] of Occupational Health - Occupational Stress Questionnaire    Feeling of Stress : Not at all  Social Connections: Moderately Integrated (04/02/2023)   Social Connection and Isolation Panel [NHANES]    Frequency of Communication with Friends and Family: More than three times a week    Frequency of Social Gatherings  with Friends and Family: Twice a week    Attends Religious Services: 1 to 4 times per year    Active Member of [employer] or Organizations: Yes    Attends Banker Meetings: More than 4 times per year    Marital Status: Widowed    Tobacco Counseling Counseling given: Not Answered Tobacco comments: Former smoker 07/04/2021   Clinical Intake:  Pre-visit preparation completed: Yes  Pain : No/denies pain     Diabetes: Yes CBG done?: No Did pt. bring in CBG monitor from home?: No  How often do you need to have someone  help you when you read instructions, pamphlets, or other written materials from your doctor or pharmacy?: 1 - Never  Interpreter Needed?: No  Information entered by :: Kandis Fantasia LPN   Activities of Daily Living    04/02/2023    1:57 PM 05/18/2022   11:30 AM  In your present state of health, do you have any difficulty performing the following activities:  Hearing? 0 0  Vision? 0 0  Difficulty concentrating or making decisions? 0 0  Walking or climbing stairs? 0 0  Dressing or bathing? 0 0  Doing errands, shopping? 0 0  Preparing Food and eating ? N N  Using the Toilet? N N  In the past six months, have you accidently leaked urine? N N  Do you have problems with loss of bowel control? N N  Managing your Medications? N N  Managing your Finances? N N  Housekeeping or managing your Housekeeping? N N    Patient Care Team: Marcine Matar, MD as PCP - General (Internal Medicine) Little Ishikawa, MD as PCP - Cardiology (Cardiology) Dorisann Frames, MD as Consulting Physician (Endocrinology) Donnelly Angelica, RN as Oncology Nurse Navigator Pershing Proud, RN as Oncology Nurse Navigator Manus Rudd, MD as Consulting Physician (General Surgery) Serena Croissant, MD as Consulting Physician (Hematology and Oncology) Lonie Peak, MD as Attending Physician (Radiation Oncology)  Indicate any recent Medical Services you may have received  from other than [facility] providers in the past year (date may be approximate).     Assessment:   This is a routine wellness examination for [place].  Hearing/Vision screen Hearing Screening - Comments:: Denies hearing difficulties   Vision Screening - Comments:: Wears rx glasses - up to date with routine eye exams with [facility].    Dietary issues and exercise activities discussed:     Goals Addressed             This Visit's Progress    Remain active and independent        Depression Screen    04/02/2023    1:55 PM 01/07/2023   11:14 AM 09/06/2022   11:50 AM 05/18/2022   11:30 AM 02/09/2022   11:28 AM 01/02/2022    1:56 PM 09/04/2021    3:16 PM  PHQ 2/9 Scores  PHQ - 2 Score 0 0 1 0 0 0 0  PHQ- 9 Score  0 1   0     Fall Risk    04/02/2023    1:56 PM 01/07/2023   11:07 AM 05/18/2022   11:30 AM 05/04/2022   11:40 AM 02/09/2022   11:28 AM  Fall Risk   Falls in the past year? 1 0 0 0 0  Number falls in past yr: 0 0 0 0 0  Injury with Fall? 0 0 0 0 0  Risk for fall due to : History of fall(s) No Fall Risks No Fall Risks No Fall Risks   Follow up Falls prevention discussed;Education provided;Falls evaluation completed  Falls evaluation completed Falls evaluation completed Education provided;Falls evaluation completed;Falls prevention discussed    MEDICARE RISK AT HOME: Medicare Risk at Home Any stairs in or around the home?: No If so, are there any without handrails?: No Home free of loose throw rugs in walkways, pet beds, electrical cords, etc?: Yes Adequate lighting in your home to reduce risk of falls?: Yes Life alert?: No Use of a cane, walker or w/c?: No Grab bars in the bathroom?: Yes Shower chair or bench  in shower?: No Elevated toilet seat or a handicapped toilet?: Yes  TIMED UP AND GO:  Was the test performed?  No    Cognitive Function:    09/06/2022   12:08 PM 02/09/2022   11:35 AM 05/30/2020   12:17 PM  MMSE - Mini Mental State Exam  Orientation to time 5  5 5   Orientation to Place 5 5 5   Registration 3 3 3   Attention/ Calculation 5 5 5   Recall 3 1 3   Language- name 2 objects 2 2 2   Language- repeat 1 1 1   Language- follow 3 step command 3 3 3   Language- read & follow direction 1 1 1   Write a sentence 1 1 1   Copy design 1 1 1   Total score 30 28 30         04/02/2023    1:57 PM 05/18/2022   11:08 AM  6CIT Screen  What Year? 0 points 0 points  What month? 0 points 0 points  What time? 0 points 0 points  Count back from 20 0 points 2 points  Months in reverse 0 points 0 points  Repeat phrase 0 points 0 points  Total Score 0 points 2 points    Immunizations Immunization History  Administered Date(s) Administered   Influenza Split 07/13/2014   Influenza, High Dose Seasonal PF 04/14/2015, 04/16/2017, 06/13/2018, 04/08/2019   Influenza, Quadrivalent, Recombinant, Inj, Pf 04/26/2020   Influenza,inj,Quad PF,6+ Mos 04/13/2021, 05/04/2022   Influenza-Unspecified 04/16/2016   PFIZER(Purple Top)SARS-COV-2 Vaccination 08/21/2019, 09/11/2019, 05/19/2020   Pneumococcal-Unspecified 06/07/2003   Td 06/02/2007, 11/12/2017   Zoster Recombinant(Shingrix) 09/12/2021, 12/14/2021   Zoster, Live 05/30/2012    TDAP status: Up to date  Flu Vaccine status: Due, Education has been provided regarding the importance of this vaccine. Advised may receive this vaccine at local pharmacy or Health Dept. Aware to provide a copy of the vaccination record if obtained from local pharmacy or Health Dept. Verbalized acceptance and understanding.  Pneumococcal vaccine status: Due, Education has been provided regarding the importance of this vaccine. Advised may receive this vaccine at local pharmacy or Health Dept. Aware to provide a copy of the vaccination record if obtained from local pharmacy or Health Dept. Verbalized acceptance and understanding.  Covid-19 vaccine status: Information provided on how to obtain vaccines.   Qualifies for Shingles Vaccine? Yes    Zostavax completed Yes   Shingrix Completed?: Yes  Screening Tests Health Maintenance  Topic Date Due   FOOT EXAM  02/09/2021   INFLUENZA VACCINE  02/28/2023   COVID-19 Vaccine (4 - 2023-24 season) 03/31/2023   Pneumonia Vaccine 20+ Years old (1 of 1 - PCV) 09/07/2023 (Originally 02/27/2002)   MAMMOGRAM  05/15/2023   HEMOGLOBIN A1C  07/09/2023   OPHTHALMOLOGY EXAM  10/31/2023   Medicare Annual Wellness (AWV)  04/01/2024   DTaP/Tdap/Td (3 - Tdap) 11/13/2027   DEXA SCAN  Completed   Zoster Vaccines- Shingrix  Completed   HPV VACCINES  Aged Out    Health Maintenance  Health Maintenance Due  Topic Date Due   FOOT EXAM  02/09/2021   INFLUENZA VACCINE  02/28/2023   COVID-19 Vaccine (4 - 2023-24 season) 03/31/2023    Colorectal cancer screening: No longer required.   Mammogram status: Completed 11/13/22. Repeat every year  Bone Density status: Completed 11/17/21. Results reflect: Bone density results: OSTEOPOROSIS. Repeat every 2 years.  Lung Cancer Screening: (Low Dose CT Chest recommended if Age 10-80 years, 20 pack-year currently smoking OR have quit w/in 15years.) does  not qualify.   Lung Cancer Screening Referral: n/a  Additional Screening:  Hepatitis C Screening: does not qualify  Vision Screening: Recommended annual ophthalmology exams for early detection of glaucoma and other disorders of the eye. Is the patient up to date with their annual eye exam?  Yes  Who is the provider or what is the name of the office in which the patient attends annual eye exams? [facility].  If pt is not established with a provider, would they like to be referred to a provider to establish care? No .   Dental Screening: Recommended annual dental exams for proper oral hygiene  Diabetic Foot Exam: Diabetic Foot Exam: Overdue, Pt has been advised about the importance in completing this exam. Pt is scheduled for diabetic foot exam on at next office visit.  Community Resource Referral / Chronic  Care Management: CRR required this visit?  No   CCM required this visit?  No     Plan:     I have personally reviewed and noted the following in the patient's chart:   Medical and social history Use of alcohol, tobacco or illicit drugs  Current medications and supplements including opioid prescriptions. Patient is not currently taking opioid prescriptions. Functional ability and status Nutritional status Physical activity Advanced directives List of other physicians Hospitalizations, surgeries, and ER visits in previous 12 months Vitals Screenings to include cognitive, depression, and falls Referrals and appointments  In addition, I have reviewed and discussed with patient certain preventive protocols, quality metrics, and best practice recommendations. A written personalized care plan for preventive services as well as general preventive health recommendations were provided to patient.     Kandis Fantasia [place], [place]   08/07/1476   After Visit Summary: (Mail) Due to this being a telephonic visit, the after visit summary with patients personalized plan was offered to patient via mail   Nurse Notes: No concerns at this time

## 2023-04-08 ENCOUNTER — Other Ambulatory Visit (HOSPITAL_COMMUNITY): Payer: Self-pay | Admitting: Cardiology

## 2023-04-16 ENCOUNTER — Other Ambulatory Visit: Payer: Self-pay | Admitting: Internal Medicine

## 2023-04-16 DIAGNOSIS — E1169 Type 2 diabetes mellitus with other specified complication: Secondary | ICD-10-CM

## 2023-04-17 NOTE — Telephone Encounter (Signed)
Requested Prescriptions  Pending Prescriptions Disp Refills   pravastatin (PRAVACHOL) 40 MG tablet [Pharmacy Med Name: PRAVASTATIN NA 40 MG TAB 40 Tablet] 90 tablet 0    Sig: TAKE 1 TABLET BY MOUTH DAILY.     Cardiovascular:  Antilipid - Statins Failed - 04/16/2023 10:04 AM      Failed - Lipid Panel in normal range within the last 12 months    Cholesterol, Total  Date Value Ref Range Status  08/01/2022 164 100 - 199 mg/dL Final   LDL Cholesterol (Calc)  Date Value Ref Range Status  09/02/2020 99 mg/dL (calc) Final    Comment:    Reference range: <100 . Desirable range <100 mg/dL for primary prevention;   <70 mg/dL for patients with CHD or diabetic patients  with > or = 2 CHD risk factors. Marland Kitchen LDL-C is now calculated using the Martin-Hopkins  calculation, which is a validated novel method providing  better accuracy than the Friedewald equation in the  estimation of LDL-C.  Horald Pollen et al. Lenox Ahr. 4098;119(14): 2061-2068  (http://education.[facility].com/faq/FAQ164)    LDL Chol Calc (NIH)  Date Value Ref Range Status  08/01/2022 74 0 - 99 mg/dL Final   HDL  Date Value Ref Range Status  08/01/2022 76 >39 mg/dL Final   Triglycerides  Date Value Ref Range Status  08/01/2022 72 0 - 149 mg/dL Final         Passed - Patient is not pregnant      Passed - Valid encounter within last 12 months    Recent Outpatient Visits           3 months ago Controlled type 2 diabetes with neuropathy [facility])   [facility] [facility] Jonah Blue B, MD   7 months ago Controlled type 2 diabetes with neuropathy [facility])   [place] [facility] Marcine Matar, MD   11 months ago Encounter for [employer] annual wellness exam   [facility] Jonah Blue B, MD   11 months ago Controlled type 2 diabetes with neuropathy [facility])   [place] [facility] Marcine Matar, MD   1 year  ago Encounter for [employer] annual wellness exam   [facility] Drucilla Chalet, RPH-CPP       Future Appointments             In 3 weeks Marcine Matar, MD [facility]

## 2023-04-29 ENCOUNTER — Other Ambulatory Visit: Payer: Self-pay | Admitting: Internal Medicine

## 2023-04-30 NOTE — Telephone Encounter (Signed)
Requested Prescriptions  Pending Prescriptions Disp Refills   citalopram (CELEXA) 20 MG tablet [Pharmacy Med Name: CITALOPRAM HBR 20 MG TABLET 20 Tablet] 90 tablet 0    Sig: TAKE 1 TABLET (20 MG TOTAL) BY MOUTH DAILY.     Psychiatry:  Antidepressants - SSRI Passed - 04/29/2023  9:36 AM      Passed - Valid encounter within last 6 months    Recent Outpatient Visits           3 months ago Controlled type 2 diabetes with neuropathy [facility])   [place] [facility] Jonah Blue B, MD   7 months ago Controlled type 2 diabetes with neuropathy [facility])   [place] [facility] Marcine Matar, MD   11 months ago Encounter for [employer] annual wellness exam   [facility] Jonah Blue B, MD   12 months ago Controlled type 2 diabetes with neuropathy [facility])   [place] [facility] Marcine Matar, MD   1 year ago Encounter for [employer] annual wellness exam   [facility] Drucilla Chalet, RPH-CPP       Future Appointments             In 1 week Marcine Matar, MD [facility]

## 2023-05-08 ENCOUNTER — Other Ambulatory Visit: Payer: Self-pay | Admitting: Hematology and Oncology

## 2023-05-09 ENCOUNTER — Ambulatory Visit: Payer: Medicare Other | Admitting: Internal Medicine

## 2023-05-23 ENCOUNTER — Encounter: Payer: Self-pay | Admitting: Hematology and Oncology

## 2023-05-27 ENCOUNTER — Inpatient Hospital Stay: Payer: Medicare Other | Attending: Hematology and Oncology | Admitting: Hematology and Oncology

## 2023-05-27 VITALS — BP 129/57 | HR 73 | Temp 97.3°F | Resp 18 | Ht 65.0 in | Wt 139.2 lb

## 2023-05-27 DIAGNOSIS — Z79811 Long term (current) use of aromatase inhibitors: Secondary | ICD-10-CM | POA: Insufficient documentation

## 2023-05-27 DIAGNOSIS — D0511 Intraductal carcinoma in situ of right breast: Secondary | ICD-10-CM

## 2023-05-27 NOTE — Assessment & Plan Note (Signed)
October 2022:Screening mammogram: indeterminate linear calcifications in the right breast. Diagnostic mammogram and [place]: 1 cm group of fine linear branching calcifications.  Stereotactic biopsy: Low-grade DCIS, ER 100%, PR 95%   Recommendation: 1.  Neoadjuvant antiestrogen therapy with tamoxifen, switched to letrozole 2.  patient did not wish to undergo surgery --------------------------------------------------------------------------------------------------------------------------------- Letrozole toxicities: Sweating every morning: Resolved with taking letrozole at bedtime. No other side effects to stop Intermittent breast twinges: Benign   Breast cancer surveillance: 05/22/2023: Right mammogram: Stable biopsy site low-grade DCIS  05/27/2023: Breast exam: Benign She gets mammograms at [facility] every 6 months on the right breast. Return to clinic in 1 year for follow-up

## 2023-05-27 NOTE — Progress Notes (Signed)
Patient Care Team: Marcine Matar, MD as PCP - General (Internal Medicine) Little Ishikawa, MD as PCP - Cardiology (Cardiology) Dorisann Frames, MD as Consulting Physician (Endocrinology) Donnelly Angelica, RN as Oncology Nurse Navigator Pershing Proud, RN as Oncology Nurse Navigator Manus Rudd, MD as Consulting Physician (General Surgery) Serena Croissant, MD as Consulting Physician (Hematology and Oncology) Lonie Peak, MD as Attending Physician (Radiation Oncology)  DIAGNOSIS:  Encounter Diagnosis  Name Primary?   Ductal carcinoma in situ (DCIS) of right breast Yes    SUMMARY OF ONCOLOGIC HISTORY: Oncology History  Ductal carcinoma in situ (DCIS) of right breast  05/19/2021 Initial Diagnosis   Screening mammogram: indeterminate linear calcifications in the right breast. Diagnostic mammogram and [place]: 1 cm group of fine linear branching calcifications.  Stereotactic biopsy: Low-grade DCIS, ER 100%, PR 95%   05/24/2021 Cancer Staging   Staging form: Breast, AJCC 8th Edition - Clinical stage from 05/24/2021: Stage 0 (cTis (DCIS), cN0, cM0, G1, ER+, PR+, HER2: Not Assessed) - Signed by Serena Croissant, MD on 05/24/2021 Histologic grading system: 3 grade system     CHIEF COMPLIANT: Follow-up on letrozole therapy   History of Present Illness   The patient, with a history of rt breast DCIS and cataracts, presents for a follow-up visit.  She did not undergo surgery for DCIS and is on surveillance. She recently underwent cataract surgery, which she reports is healing well. She has been compliant with her medication regimen, including letrozole for her breast cancer. Her mammograms have been consistently good, and she has been able to avoid surgery for her breast cancer, which aligns with her wishes.  The patient also reports a significant amount of daily physical activity, including walking for approximately thirty minutes a day. She has been losing weight and spending time  outdoors. She has no changes to her medication list, but she did express some confusion about her potassium medication, which was clarified as a blood pressure medication.      ALLERGIES:  is allergic to buspar [buspirone], lipitor [atorvastatin], metformin and related, metformin hcl, nsaids, sulfa antibiotics, sulfacetamide sodium-sulfur, and sulfonamide derivatives.  MEDICATIONS:  Current Outpatient Medications  Medication Sig Dispense Refill   acetaminophen (TYLENOL) 650 MG CR tablet Take 650 mg by mouth every 8 (eight) hours as needed for pain.     allopurinol (ZYLOPRIM) 300 MG tablet TAKE 1 TABLET BY MOUTH DAILY TO PREVENT GOUT 90 tablet 1   apixaban (ELIQUIS) 5 MG TABS tablet Take 1 tablet (5 mg total) by mouth 2 (two) times daily. 180 tablet 1   cholecalciferol (VITAMIN D) 1000 UNITS tablet Take 5,000 Units by mouth every other day.      citalopram (CELEXA) 20 MG tablet TAKE 1 TABLET (20 MG TOTAL) BY MOUTH DAILY. 90 tablet 0   diltiazem (CARDIZEM CD) 360 MG 24 hr capsule TAKE 1 CAPSULE (360 MG TOTAL) BY MOUTH DAILY. 90 capsule 3   furosemide (LASIX) 20 MG tablet TAKE 1 TABLET (20 MG TOTAL) BY MOUTH DAILY. 90 tablet 3   Lancets (FREESTYLE) lancets   12   letrozole (FEMARA) 2.5 MG tablet TAKE 1 TABLET (2.5 MG TOTAL) BY MOUTH DAILY. 90 tablet 3   losartan (COZAAR) 50 MG tablet Take 1 tablet (50 mg total) by mouth daily. 90 tablet 3   pravastatin (PRAVACHOL) 40 MG tablet TAKE 1 TABLET BY MOUTH DAILY. 90 tablet 0   Simethicone (GAS-X PO) Take 2 tablets by mouth daily as needed (bloating).  No current facility-administered medications for this visit.    PHYSICAL EXAMINATION: ECOG PERFORMANCE STATUS: 1 - Symptomatic but completely ambulatory  Vitals:   05/27/23 1115  BP: (!) 129/57  Pulse: 73  Resp: 18  Temp: (!) 97.3 F (36.3 C)  SpO2: 98%   Filed Weights   05/27/23 1115  Weight: 139 lb 3.2 oz (63.1 kg)      LABORATORY DATA:  I have reviewed the data as listed     Latest Ref Rng & Units 02/14/2023    2:08 PM 08/01/2022    9:18 AM 03/05/2022    2:14 PM  CMP  Glucose 70 - 99 mg/dL 010  272  99   BUN 8 - 27 mg/dL 13  17  16    Creatinine 0.57 - 1.00 mg/dL 5.36  6.44  0.34   Sodium 134 - 144 mmol/L 141  141  139   Potassium 3.5 - 5.2 mmol/L 3.9  3.7  4.5   Chloride 96 - 106 mmol/L 101  102  100   CO2 20 - 29 mmol/L 26  26  27    Calcium 8.7 - 10.3 mg/dL 9.0  9.2  9.8   Total Protein 6.0 - 8.5 g/dL  6.6    Total Bilirubin 0.0 - 1.2 mg/dL  0.5    Alkaline Phos 44 - 121 IU/L  50    AST 0 - 40 IU/L  23    ALT 0 - 32 IU/L  23      Lab Results  Component Value Date   WBC 6.5 02/14/2023   HGB 13.2 02/14/2023   HCT 38.6 02/14/2023   MCV 96 02/14/2023   PLT 192 02/14/2023   NEUTROABS 3.9 05/24/2021    ASSESSMENT & PLAN:  Ductal carcinoma in situ (DCIS) of right breast October 2022:Screening mammogram: indeterminate linear calcifications in the right breast. Diagnostic mammogram and [place]: 1 cm group of fine linear branching calcifications.  Stereotactic biopsy: Low-grade DCIS, ER 100%, PR 95%   Recommendation: 1.  Neoadjuvant antiestrogen therapy with tamoxifen, switched to letrozole 2.  patient did not wish to undergo surgery --------------------------------------------------------------------------------------------------------------------------------- Letrozole toxicities: Sweating every morning: Resolved with taking letrozole at bedtime. No other side effects to stop Intermittent breast twinges: Benign   Breast cancer surveillance: 05/22/2023: Right mammogram: Stable biopsy site low-grade DCIS  05/27/2023: Breast exam: Benign She gets mammograms at [facility] every 6 months on the right breast. Return to clinic in 1 year for follow-up    Cataracts Recently underwent successful cataract surgery and is currently in the healing phase.    Orders Placed This Encounter  Procedures   MM DIAG BREAST TOMO UNI RIGHT    Standing Status:   Future     Standing Expiration Date:   05/26/2024    Order Specific Question:   Reason for Exam (SYMPTOM  OR DIAGNOSIS REQUIRED)    Answer:   DCIS follow up    Order Specific Question:   Preferred imaging location?    Answer:   External    Comments:   [facility]   The patient has a good understanding of the overall plan. she agrees with it. she will call with any problems that may develop before the next visit here. Total time spent: 30 mins including face to face time and time spent for planning, charting and co-ordination of care   Tamsen Meek, MD 05/27/23

## 2023-07-17 ENCOUNTER — Ambulatory Visit: Payer: Medicare Other | Admitting: Dermatology

## 2023-07-18 ENCOUNTER — Encounter: Payer: Self-pay | Admitting: Internal Medicine

## 2023-07-18 ENCOUNTER — Ambulatory Visit: Payer: Medicare Other | Attending: Internal Medicine | Admitting: Internal Medicine

## 2023-07-18 VITALS — BP 148/83 | HR 74 | Temp 98.3°F | Ht 65.0 in | Wt 142.0 lb

## 2023-07-18 DIAGNOSIS — E1140 Type 2 diabetes mellitus with diabetic neuropathy, unspecified: Secondary | ICD-10-CM | POA: Diagnosis not present

## 2023-07-18 DIAGNOSIS — I152 Hypertension secondary to endocrine disorders: Secondary | ICD-10-CM

## 2023-07-18 DIAGNOSIS — Z23 Encounter for immunization: Secondary | ICD-10-CM | POA: Diagnosis not present

## 2023-07-18 DIAGNOSIS — Z9181 History of falling: Secondary | ICD-10-CM | POA: Diagnosis not present

## 2023-07-18 DIAGNOSIS — Z711 Person with feared health complaint in whom no diagnosis is made: Secondary | ICD-10-CM | POA: Diagnosis not present

## 2023-07-18 DIAGNOSIS — I480 Paroxysmal atrial fibrillation: Secondary | ICD-10-CM

## 2023-07-18 DIAGNOSIS — I10 Essential (primary) hypertension: Secondary | ICD-10-CM

## 2023-07-18 DIAGNOSIS — E1169 Type 2 diabetes mellitus with other specified complication: Secondary | ICD-10-CM

## 2023-07-18 DIAGNOSIS — Z7984 Long term (current) use of oral hypoglycemic drugs: Secondary | ICD-10-CM

## 2023-07-18 DIAGNOSIS — E1159 Type 2 diabetes mellitus with other circulatory complications: Secondary | ICD-10-CM

## 2023-07-18 DIAGNOSIS — E785 Hyperlipidemia, unspecified: Secondary | ICD-10-CM

## 2023-07-18 LAB — POCT GLYCOSYLATED HEMOGLOBIN (HGB A1C): HbA1c, POC (controlled diabetic range): 6 % (ref 0.0–7.0)

## 2023-07-18 LAB — GLUCOSE, POCT (MANUAL RESULT ENTRY): POC Glucose: 108 mg/dL — AB (ref 70–99)

## 2023-07-18 MED ORDER — CITALOPRAM HYDROBROMIDE 20 MG PO TABS: 20.0000 mg | ORAL_TABLET | Freq: Every day | ORAL | 0 refills | Status: DC

## 2023-07-18 NOTE — Patient Instructions (Addendum)

## 2023-07-18 NOTE — Progress Notes (Addendum)
Patient ID: Ruth Delgado, female    DOB: 1934/12/04  MRN: [id number]  CC: Diabetes (DM f/u. Med refills. /Pt experiencing memory loss - requesting med to help with memory /Flu vax administered on 07/18/2023 - C.A.)   Subjective: Ruth Delgado is a 88 y.o. female who presents for chronic ds management. Her concerns today include:  Pt with hx of HTN, HL, PAF on anticoagulation, chronic diastolic CHF, DM type 2, GERD, vit D def, DVT RT leg, gout, depression, gout,  DCIS RT breast ER/PR +   HTN/diastolic CHF/PAF: Saw Dr. Bjorn Pippin her cardiologist in July.  No changes made in medications.  Reports compliance with diltiazem 360 mg daily, furosemide 20 mg daily, Cozaar 50 mg daily and Eliquis.  Took BP meds already today.  Gets nerves when she comes to office -No bleeding on Eliquis but bruise easily on extremities. Last H/H 13.2/38.6 Checks BP regularly.  Has log:  112/64, 133/73, 139/80, 118/66.  Good BP reading when she saw Dr. Pamelia Hoit in 04/2023.  She states when she comes to visit with me, she gets a bit nervous and blood pressure goes up.  HL: On pravastatin 40 mg daily.  Last LDL in January of this year was 75.  History of breast CA: Saw Dr. Dr. Georgiann Mohs in follow-up 04/2023.  She is on active surveillance for low-grade DCIS.  On letrozole.  Most recent mammogram was in October and showed stable findings in the right breast.  DM: Results for orders placed or performed in visit on 07/18/23  POCT glucose (manual entry)   Collection Time: 07/18/23  1:42 PM  Result Value Ref Range   POC Glucose 108 (A) 70 - 99 mg/dl  POCT glycosylated hemoglobin (Hb A1C)   Collection Time: 07/18/23  1:50 PM  Result Value Ref Range   Hemoglobin A1C     HbA1c POC (<> result, manual entry)     HbA1c, POC (prediabetic range)     HbA1c, POC (controlled diabetic range) 6.0 0.0 - 7.0 %  She is diet control Does well with eating habits except this past wk where neighbors have been bring over sweets to her  given it is the holidays -checks BS 1-2x/wk.  Gives range 70-115 Had cataract surgery RT 9/23 and LT 05/06/2023 She walks daily for about 15 minutes.  Children are concern about her memory.   Sometimes she forgets where she puts things like her keys. Has to think where she puts stuff.  Occasionally she is told that she repeats herself.  Still drives.  Lives alone.  No problems forgetting to turn stove or water on.  She double checks to make sure that she turns the stove off and that she locks her doors.  No problems with depression.  No fhx of dementia Larey Seat in August; went in her shed to get weed eater; step up and loss her footing; fell on side. Was able to get up. No injuries.  Larey Seat again about 3 months ago getting walking around the bed. Tripped on something and hit her bottom teeth against her dresser.  No loss of consciousness.  HM: yes to flu shot  Patient Active Problem List   Diagnosis Date Noted   Hyperglycemia due to type 2 diabetes mellitus (HCC) 01/02/2022   Hypercholesterolemia 01/02/2022   History of shingles 01/02/2022   Essential hypertension 05/24/2021   Intraductal carcinoma in situ of right breast 05/23/2021   Ductal carcinoma in situ (DCIS) of right breast 05/19/2021   Paroxysmal atrial  fibrillation (HCC) 12/09/2020   Hypercoagulable state due to paroxysmal atrial fibrillation (HCC) 12/09/2020   Atrial fibrillation with RVR (HCC) 11/15/2020   Nausea, vomiting and diarrhea 11/15/2020   Chronic atrial fibrillation (HCC) 11/15/2020   CKD stage 2 due to type 2 diabetes mellitus (HCC) 03/06/2018   Overweight (BMI 25.0-29.9) 03/06/2018   Osteopenia 03/06/2018   Esophageal reflux 09/06/2015   Vitamin D deficiency 06/08/2013   Medication management 06/08/2013   Hypertension    Hyperlipemia    Gout      Current Outpatient Medications on File Prior to Visit  Medication Sig Dispense Refill   acetaminophen (TYLENOL) 650 MG CR tablet Take 650 mg by mouth every 8 (eight)  hours as needed for pain.     allopurinol (ZYLOPRIM) 300 MG tablet TAKE 1 TABLET BY MOUTH DAILY TO PREVENT GOUT 90 tablet 1   apixaban (ELIQUIS) 5 MG TABS tablet Take 1 tablet (5 mg total) by mouth 2 (two) times daily. 180 tablet 1   cholecalciferol (VITAMIN D) 1000 UNITS tablet Take 5,000 Units by mouth every other day.      diltiazem (CARDIZEM CD) 360 MG 24 hr capsule TAKE 1 CAPSULE (360 MG TOTAL) BY MOUTH DAILY. 90 capsule 3   furosemide (LASIX) 20 MG tablet TAKE 1 TABLET (20 MG TOTAL) BY MOUTH DAILY. 90 tablet 3   Lancets (FREESTYLE) lancets   12   letrozole (FEMARA) 2.5 MG tablet TAKE 1 TABLET (2.5 MG TOTAL) BY MOUTH DAILY. 90 tablet 3   losartan (COZAAR) 50 MG tablet Take 1 tablet (50 mg total) by mouth daily. 90 tablet 3   pravastatin (PRAVACHOL) 40 MG tablet TAKE 1 TABLET BY MOUTH DAILY. 90 tablet 0   Simethicone (GAS-X PO) Take 2 tablets by mouth daily as needed (bloating).     No current facility-administered medications on file prior to visit.    Allergies  Allergen Reactions   Buspar [Buspirone]     dysphoria   Lipitor [Atorvastatin]     Myalgias   Metformin And Related     Gas/bloating   Metformin Hcl Other (See Comments)   Nsaids     GI upset   Sulfa Antibiotics Other (See Comments)    Reaction=throat itching   Sulfacetamide Sodium-Sulfur Other (See Comments)   Sulfonamide Derivatives     Social History   Socioeconomic History   Marital status: Widowed    Spouse name: Not on file   Number of children: Not on file   Years of education: Not on file   Highest education level: Not on file  Occupational History   Occupation: retired  Tobacco Use   Smoking status: Former    Current packs/day: 0.00    Average packs/day: 0.5 packs/day for 25.0 years (12.5 ttl pk-yrs)    Types: Cigarettes    Start date: 07/30/1981    Quit date: 07/30/2006    Years since quitting: 16.9   Smokeless tobacco: Never   Tobacco comments:    Former smoker 07/04/2021  Substance and Sexual  Activity   Alcohol use: Not Currently    Alcohol/week: 2.0 standard drinks of alcohol    Types: 2 Standard drinks or equivalent per week    Comment: occasional, none in 3 months   Drug use: Never   Sexual activity: Not on file  Other Topics Concern   Not on file  Social History Narrative   Not on file   Social Drivers of Health   Financial Resource Strain: Low Risk  (04/02/2023)  Overall Financial Resource Strain (CARDIA)    Difficulty of Paying Living Expenses: Not hard at all  Food Insecurity: No Food Insecurity (04/02/2023)   Hunger Vital Sign    Worried About Running Out of Food in the Last Year: Never true    Ran Out of Food in the Last Year: Never true  Transportation Needs: No Transportation Needs (04/02/2023)   PRAPARE - Administrator, Civil Service (Medical): No    Lack of Transportation (Non-Medical): No  Physical Activity: Sufficiently Active (04/02/2023)   Exercise Vital Sign    Days of Exercise per Week: 5 days    Minutes of Exercise per Session: 30 min  Stress: No Stress Concern Present (04/02/2023)   [employer] of Occupational Health - Occupational Stress Questionnaire    Feeling of Stress : Not at all  Social Connections: Moderately Integrated (04/02/2023)   Social Connection and Isolation Panel [NHANES]    Frequency of Communication with Friends and Family: More than three times a week    Frequency of Social Gatherings with Friends and Family: Twice a week    Attends Religious Services: 1 to 4 times per year    Active Member of [employer] or Organizations: Yes    Attends Banker Meetings: More than 4 times per year    Marital Status: Widowed  Intimate Partner Violence: Not At Risk (04/02/2023)   Humiliation, Afraid, Rape, and Kick questionnaire    Fear of Current or Ex-Partner: No    Emotionally Abused: No    Physically Abused: No    Sexually Abused: No    Family History  Problem Relation Age of Onset   Diabetes Mother        suicide  at age 38   Hypertension Father    CVA Father     Past Surgical History:  Procedure Laterality Date   ABDOMINAL HYSTERECTOMY     BIOPSY BREAST     (L) BREAST IN 1993   CATARACT EXTRACTION Bilateral    04/22/23 RT and 05/06/2023   KNEE ARTHROSCOPY      ROS: Review of Systems Negative except as stated above  PHYSICAL EXAM: BP (!) 148/83   Pulse 74   Temp 98.3 F (36.8 C) (Oral)   Ht 5\' 5"  (1.651 m)   Wt 142 lb (64.4 kg)   SpO2 98%   BMI 23.63 kg/m   Wt Readings from Last 3 Encounters:  07/18/23 142 lb (64.4 kg)  05/27/23 139 lb 3.2 oz (63.1 kg)  04/02/23 143 lb (64.9 kg)    Physical Exam  General appearance - alert, well appearing, pleasant elderly female and in no distress Mental status - normal mood, behavior, speech, dress, motor activity, and thought processes Neck - supple, no significant adenopathy Chest - clear to auscultation, no wheezes, rales or rhonchi, symmetric air entry Heart -heart sounds to be in regular rhythm. Musculoskeletal -gait is steady and unassisted.  She has good foot floor clearance. Extremities - no LE edema; wearing compression socks     07/18/2023    2:29 PM 09/06/2022   12:08 PM 02/09/2022   11:35 AM  MMSE - Mini Mental State Exam  Orientation to time 5 5 5   Orientation to Place 5 5 5   Registration 3 3 3   Attention/ Calculation 5 5 5   Recall 3 3 1   Language- name 2 objects 2 2 2   Language- repeat 1 1 1   Language- follow 3 step command 3 3 3   Language- read &  follow direction 1 1 1   Write a sentence 1 1 1   Copy design 1 1 1   Total score 30 30 28       07/18/2023    1:34 PM 04/02/2023    1:55 PM 01/07/2023   11:14 AM  Depression screen PHQ 2/9  Decreased Interest 0 0 0  Down, Depressed, Hopeless 0 0 0  PHQ - 2 Score 0 0 0  Altered sleeping 0  0  Tired, decreased energy 0  0  Change in appetite 0  0  Feeling bad or failure about yourself  0  0  Trouble concentrating 0  0  Moving slowly or fidgety/restless 0  0  Suicidal  thoughts 0  0  PHQ-9 Score 0  0  Difficult doing work/chores Not difficult at all          Latest Ref Rng & Units 02/14/2023    2:08 PM 08/01/2022    9:18 AM 03/05/2022    2:14 PM  CMP  Glucose 70 - 99 mg/dL 914  782  99   BUN 8 - 27 mg/dL 13  17  16    Creatinine 0.57 - 1.00 mg/dL 9.56  2.13  0.86   Sodium 134 - 144 mmol/L 141  141  139   Potassium 3.5 - 5.2 mmol/L 3.9  3.7  4.5   Chloride 96 - 106 mmol/L 101  102  100   CO2 20 - 29 mmol/L 26  26  27    Calcium 8.7 - 10.3 mg/dL 9.0  9.2  9.8   Total Protein 6.0 - 8.5 g/dL  6.6    Total Bilirubin 0.0 - 1.2 mg/dL  0.5    Alkaline Phos 44 - 121 IU/L  50    AST 0 - 40 IU/L  23    ALT 0 - 32 IU/L  23     Lipid Panel     Component Value Date/Time   CHOL 164 08/01/2022 0918   TRIG 72 08/01/2022 0918   HDL 76 08/01/2022 0918   CHOLHDL 2.2 08/01/2022 0918   CHOLHDL 3.2 09/02/2020 1035   VLDL 28 01/14/2017 1114   LDLCALC 74 08/01/2022 0918   LDLCALC 99 09/02/2020 1035    CBC    Component Value Date/Time   WBC 6.5 02/14/2023 1408   WBC 6.9 05/24/2021 0808   WBC 7.4 12/01/2020 1615   RBC 4.01 02/14/2023 1408   RBC 4.06 05/24/2021 0808   HGB 13.2 02/14/2023 1408   HCT 38.6 02/14/2023 1408   PLT 192 02/14/2023 1408   MCV 96 02/14/2023 1408   MCH 32.9 02/14/2023 1408   MCH 33.0 05/24/2021 0808   MCHC 34.2 02/14/2023 1408   MCHC 33.8 05/24/2021 0808   RDW 12.4 02/14/2023 1408   LYMPHSABS 2.4 05/24/2021 0808   MONOABS 0.5 05/24/2021 0808   EOSABS 0.1 05/24/2021 0808   BASOSABS 0.1 05/24/2021 0808    ASSESSMENT AND PLAN: 1. Controlled type 2 diabetes with neuropathy (HCC) (Primary) Diet control.  Encouraged her to be mindful of portion sizes and what she is eating during the holiday season. - POCT glycosylated hemoglobin (Hb A1C) - POCT glucose (manual entry) - CBC  2. Concern about memory Patient scored perfectly on Mini-Mental status exam.  Concerns voiced by family may be due to normal aging.  However, given her  family concern, I recommend referring her for neuropsychiatric evaluation to get a baseline but patient declines.  Encouraged her to continue her regular exercise which is  walking.  Discussed other things to help with memory including maintaining her routine and using memory aids such as diary or journal, daily planner that she can hang on her refrigerator, making a list of things to do, setting alarms to remind her to do certain things like taking her medication etc.  Printed information given.  Depression screen is good.  We will check vitamin B12 level. - Vitamin B12  3. History of recent fall Gait seems steady.  However I recommend referral to physical therapy for safety training.  Patient declines at this time.  4. White coat syndrome with diagnosis of hypertension 5. Hypertension associated with type 2 diabetes mellitus (HCC) Blood pressure elevated here but good readings at home.  No changes made to her medications.  She will continue the Cardizem 360 mg daily and Cozaar 50 mg daily.  6. PAF (paroxysmal atrial fibrillation) (HCC) Sounds to be in sinus rhythm at this time.  Continue Eliquis.[id number] 5  7. Hyperlipidemia associated with type 2 diabetes mellitus (HCC) Continue pravastatin 40 mg daily - Lipid panel  8. Encounter for immunization - Flu Vaccine Trivalent High Dose (Fluad)  Addendum: Recommend that patient get a Life Alert device to wear around her neck to use in cases of emergency given that she lives alone.  She is agreeable to speaking with our CW about how to go about getting one.  Patient was given the opportunity to ask questions.  Patient verbalized understanding of the plan and was able to repeat key elements of the plan.   This documentation was completed using Paediatric nurse.  Any transcriptional errors are unintentional.  Orders Placed This Encounter  Procedures   Flu Vaccine Trivalent High Dose (Fluad)   CBC   Lipid panel   Vitamin  B12   POCT glycosylated hemoglobin (Hb A1C)   POCT glucose (manual entry)     Requested Prescriptions   Signed Prescriptions Disp Refills   citalopram (CELEXA) 20 MG tablet 90 tablet 0    Sig: Take 1 tablet (20 mg total) by mouth daily.    Return in about 4 months (around 11/16/2023).  Jonah Blue, MD, FACP

## 2023-07-19 LAB — CBC
Hematocrit: 41.6 % (ref 34.0–46.6)
Hemoglobin: 14.2 g/dL (ref 11.1–15.9)
MCH: 33.3 pg — ABNORMAL HIGH (ref 26.6–33.0)
MCHC: 34.1 g/dL (ref 31.5–35.7)
MCV: 98 fL — ABNORMAL HIGH (ref 79–97)
Platelets: 214 10*3/uL (ref 150–450)
RBC: 4.26 x10E6/uL (ref 3.77–5.28)
RDW: 12.6 % (ref 11.7–15.4)
WBC: 7.8 10*3/uL (ref 3.4–10.8)

## 2023-07-19 LAB — LIPID PANEL
Chol/HDL Ratio: 2.3 {ratio} (ref 0.0–4.4)
Cholesterol, Total: 169 mg/dL (ref 100–199)
HDL: 72 mg/dL (ref >39)
LDL Chol Calc (NIH): 79 mg/dL (ref 0–99)
Triglycerides: 100 mg/dL (ref 0–149)
VLDL Cholesterol Cal: 18 mg/dL (ref 5–40)

## 2023-07-19 LAB — VITAMIN B12: Vitamin B-12: 389 pg/mL (ref 232–1245)

## 2023-07-22 ENCOUNTER — Other Ambulatory Visit: Payer: Self-pay | Admitting: Internal Medicine

## 2023-07-22 ENCOUNTER — Ambulatory Visit: Payer: Self-pay | Admitting: *Deleted

## 2023-07-22 DIAGNOSIS — E1169 Type 2 diabetes mellitus with other specified complication: Secondary | ICD-10-CM

## 2023-07-22 NOTE — Telephone Encounter (Signed)
  Chief Complaint: Pt given lab results per notes of Johnson on 07/20/2023 at 8:50 AM.   Pt verbalized understanding.  Symptoms: N/A Frequency: N/A Pertinent Negatives: Patient denies N/A Disposition: [] ED /[] Urgent Care (no appt availability in office) / [] Appointment(In office/virtual)/ []  [facility]/ [x] Home Care/ [] Refused Recommended Disposition /[]  Mobile Bus/ []  Follow-up with PCP Additional Notes:

## 2023-07-22 NOTE — Telephone Encounter (Signed)
Reason for Disposition  [1] Follow-up call to recent contact AND [2] information only call, no triage required  Answer Assessment - Initial Assessment Questions 1. REASON FOR CALL or QUESTION: "What is your reason for calling today?" or "How can I best help you?" or "What question do you have that I can help answer?"     Pt given lab results per notes of Dr.    Laural Benes on 07/20/2023 at 8:50 AM. Pt verbalized understanding.  Protocols used: Information Only Call - No Triage-A-AH

## 2023-07-23 ENCOUNTER — Telehealth: Payer: Self-pay

## 2023-07-23 NOTE — Telephone Encounter (Signed)
Message received from Dr Laural Benes stating the patient is interested in a Life Alert. I called the patient but had to leave a message requesting a call back.   Her insurance may cover the cost of Life Alert as an added benefit. If she is willing, I can refer her to [facility] care guide for assistance with navigating her insurance benefits.

## 2023-08-01 NOTE — Telephone Encounter (Signed)
 I called the patient again and had to leave a message requesting a call back.   I need to inform her that her insurance may cover the cost of Life Alert as an added benefit. If she is willing, I can refer her to [facility] care guide for assistance with navigating her insurance benefits.

## 2023-08-02 NOTE — Telephone Encounter (Signed)
 Pt is calling in returning a call from [place]. Pt is currently declining Life Alert services.

## 2023-08-02 NOTE — Telephone Encounter (Signed)
 Call placed to patient unable to reach message left on VM.

## 2023-08-05 ENCOUNTER — Other Ambulatory Visit: Payer: Self-pay | Admitting: Internal Medicine

## 2023-08-05 DIAGNOSIS — Z8739 Personal history of other diseases of the musculoskeletal system and connective tissue: Secondary | ICD-10-CM

## 2023-08-18 NOTE — Progress Notes (Unsigned)
Cardiology Office Note:    Date:  08/22/2023   ID:  Ruth Delgado, DOB 03/06/1935, MRN [id number]  PCP:  Marcine Matar, MD  Cardiologist:  Little Ishikawa, MD  Electrophysiologist:  None   Referring MD: Marcine Matar, MD   Chief Complaint  Patient presents with   Atrial Fibrillation    History of Present Illness:    Ruth Delgado is a 89 y.o. female with a hx of atrial fibrillation, T2DM, hypertension, hyperlipidemia, gout who presents for follow-up.  She was referred by Dr. Oneta Rack for evaluation of atrial fibrillation, initially seen 11/2020.  She was admitted to [facility] from 4/19 through 11/18/2020.  She presented with sudden onset nausea/vomiting/diarrhea.  On the ED she went into A. fib with RVR and was started on diltiazem drip.  This was transitioned to p.o. diltiazem and she was started on Eliquis 5 mg twice daily.  Zio patch x2 weeks on 12/22/2020 showed AF burden 4%, average rate 150, with longest episode lasting 2 hours 46 minutes.  Echocardiogram 12/28/2020 showed normal biventricular function, no significant valvular disease.  Since last clinic visit, she reports she is doing okay.  Denies any chest pain, dyspnea, lightheadedness, syncope, edema or palpitations.  Had recent fall in the snow.  Also had fall in August where she missed a step and fell and hit her head. .   Past Medical History:  Diagnosis Date   A-fib (HCC)    Anxiety    Arthritis    Breast cancer (HCC)    Diabetes mellitus    Gout    Hyperlipemia    Hypertension     Past Surgical History:  Procedure Laterality Date   ABDOMINAL HYSTERECTOMY     BIOPSY BREAST     (L) BREAST IN 1993   CATARACT EXTRACTION Bilateral    04/22/23 RT and 05/06/2023   KNEE ARTHROSCOPY      Current Medications: Current Meds  Medication Sig   acetaminophen (TYLENOL) 650 MG CR tablet Take 650 mg by mouth every 8 (eight) hours as needed for pain.   allopurinol (ZYLOPRIM) 300 MG tablet TAKE 1 TABLET BY  MOUTH DAILY TO PREVENT GOUT   apixaban (ELIQUIS) 5 MG TABS tablet Take 1 tablet (5 mg total) by mouth 2 (two) times daily.   cholecalciferol (VITAMIN D) 1000 UNITS tablet Take 5,000 Units by mouth every other day.    citalopram (CELEXA) 20 MG tablet Take 1 tablet (20 mg total) by mouth daily.   diltiazem (CARDIZEM CD) 360 MG 24 hr capsule TAKE 1 CAPSULE (360 MG TOTAL) BY MOUTH DAILY.   furosemide (LASIX) 20 MG tablet TAKE 1 TABLET (20 MG TOTAL) BY MOUTH DAILY.   Lancets (FREESTYLE) lancets    letrozole (FEMARA) 2.5 MG tablet TAKE 1 TABLET (2.5 MG TOTAL) BY MOUTH DAILY.   losartan (COZAAR) 50 MG tablet Take 1 tablet (50 mg total) by mouth daily.   pravastatin (PRAVACHOL) 40 MG tablet TAKE 1 TABLET BY MOUTH DAILY.   Simethicone (GAS-X PO) Take 2 tablets by mouth daily as needed (bloating).     Allergies:   Buspar [buspirone], Lipitor [atorvastatin], Metformin and related, Metformin hcl, Nsaids, Sulfa antibiotics, Sulfacetamide sodium-sulfur, and Sulfonamide derivatives   Social History   Socioeconomic History   Marital status: Widowed    Spouse name: Not on file   Number of children: Not on file   Years of education: Not on file   Highest education level: Not on file  Occupational History  Occupation: retired  Tobacco Use   Smoking status: Former    Current packs/day: 0.00    Average packs/day: 0.5 packs/day for 25.0 years (12.5 ttl pk-yrs)    Types: Cigarettes    Start date: 07/30/1981    Quit date: 07/30/2006    Years since quitting: 17.0   Smokeless tobacco: Never   Tobacco comments:    Former smoker 07/04/2021  Substance and Sexual Activity   Alcohol use: Not Currently    Alcohol/week: 2.0 standard drinks of alcohol    Types: 2 Standard drinks or equivalent per week    Comment: occasional, none in 3 months   Drug use: Never   Sexual activity: Not on file  Other Topics Concern   Not on file  Social History Narrative   Not on file   Social Drivers of Health   Financial  Resource Strain: Low Risk  (04/02/2023)   Overall Financial Resource Strain (CARDIA)    Difficulty of Paying Living Expenses: Not hard at all  Food Insecurity: No Food Insecurity (04/02/2023)   Hunger Vital Sign    Worried About Running Out of Food in the Last Year: Never true    Ran Out of Food in the Last Year: Never true  Transportation Needs: No Transportation Needs (04/02/2023)   PRAPARE - Administrator, Civil Service (Medical): No    Lack of Transportation (Non-Medical): No  Physical Activity: Sufficiently Active (04/02/2023)   Exercise Vital Sign    Days of Exercise per Week: 5 days    Minutes of Exercise per Session: 30 min  Stress: No Stress Concern Present (04/02/2023)   [employer] of Occupational Health - Occupational Stress Questionnaire    Feeling of Stress : Not at all  Social Connections: Moderately Integrated (04/02/2023)   Social Connection and Isolation Panel [NHANES]    Frequency of Communication with Friends and Family: More than three times a week    Frequency of Social Gatherings with Friends and Family: Twice a week    Attends Religious Services: 1 to 4 times per year    Active Member of [employer] or Organizations: Yes    Attends Banker Meetings: More than 4 times per year    Marital Status: Widowed     Family History: The patient's family history includes CVA in her father; Diabetes in her mother; Hypertension in her father.  ROS:   Please see the history of present illness.  All other systems reviewed and are negative.  EKGs/Labs/Other Studies Reviewed:    The following studies were reviewed today:   EKG:   08/22/23: NSR, rate 65 02/14/2023: Normal sinus rhythm, rate 65, poor R wave progression, nonspecific T wave flattening 02/28/21:NSR, rate 67, No ST abnormalities 11/28/2020: Normal Sinus Rhythm. Rate 62 bpm. No ST abnormalities   Recent Labs: 02/14/2023: BUN 13; Creatinine, Ser 0.74; Magnesium 2.1; Potassium 3.9; Sodium  141 07/18/2023: Hemoglobin 14.2; Platelets 214  Recent Lipid Panel    Component Value Date/Time   CHOL 169 07/18/2023 1500   TRIG 100 07/18/2023 1500   HDL 72 07/18/2023 1500   CHOLHDL 2.3 07/18/2023 1500   CHOLHDL 3.2 09/02/2020 1035   VLDL 28 01/14/2017 1114   LDLCALC 79 07/18/2023 1500   LDLCALC 99 09/02/2020 1035    Physical Exam:    VS:  BP (!) 158/70 (BP Location: Right Arm, Patient Position: Sitting, Cuff Size: Normal)   Pulse 65   Ht 5\' 5"  (1.651 m)   Wt 141 lb (64  kg)   SpO2 92%   BMI 23.46 kg/m     Wt Readings from Last 3 Encounters:  08/22/23 141 lb (64 kg)  07/18/23 142 lb (64.4 kg)  05/27/23 139 lb 3.2 oz (63.1 kg)     GEN: Well nourished, well developed in no acute distress HEENT: Normal NECK: No JVD; No carotid bruits LYMPHATICS: No lymphadenopathy CARDIAC: RRR, no murmurs, rubs, gallops RESPIRATORY:  Clear to auscultation without rales, wheezing or rhonchi  ABDOMEN: Soft, non-tender, non-distended MUSCULOSKELETAL:  compression stockings in place SKIN: Warm and dry NEUROLOGIC:  Alert and oriented x 3 PSYCHIATRIC:  Normal affect   ASSESSMENT:    1. Paroxysmal atrial fibrillation (HCC)   2. Chronic diastolic heart failure (HCC)   3. Primary hypertension   4. Hyperlipidemia, unspecified hyperlipidemia type      PLAN:    Atrial fibrillation: Diagnosed during admission with gastroenteritis 10/2020.  CHA2DS2-VASc score 5 (hypertension, age x2, diabetes, female).  Zio patch x2 weeks on 12/22/2020 showed AF burden 4%, average rate 150, with longest episode lasting 2 hours 46 minutes.  Echocardiogram 12/28/2020 showed normal biventricular function, no significant valvular disease. -Continue diltiazem 360 mg daily -Continue Eliquis 5 mg twice daily.  Discussed risks and benefits of anticoagulation and she is in agreement with continuing on Eliquis  Chronic diastolic heart failure: On Lasix 20 mg daily.  Appears euvolemic.   Hypertension: Currently on  diltiazem 360 mg daily and losartan 50 mg daily.  Elevated in clinic today but appears controlled at home, will monitor  Hyperlipidemia: On pravastatin 40 mg daily.  LDL 79 on 07/18/2023  Hypokalemia: Potassium 2.6 on 11/18/2020.  Likely due to vomiting and diarrhea in the setting of Lasix use.  Lasix discontinued but was restarted, potassium has since been normal.    T2DM: A1c down to 6.0% on 07/18/2023.  Currently diet controlled.   RTC in 6 months   Medication Adjustments/Labs and Tests Ordered: Current medicines are reviewed at length with the patient today.  Concerns regarding medicines are outlined above.  Orders Placed This Encounter  Procedures   EKG 12-Lead    No orders of the defined types were placed in this encounter.    Patient Instructions  Medication Instructions:  Continue current medications *If you need a refill on your cardiac medications before your next appointment, please call your pharmacy*   Lab Work: none If you have labs (blood work) drawn today and your tests are completely normal, you will receive your results only by: MyChart Message (if you have MyChart) OR A paper copy in the mail If you have any lab test that is abnormal or we need to change your treatment, we will call you to review the results.   Testing/Procedures: none   Follow-Up: At [facility], you and your health needs are our priority.  As part of our continuing mission to provide you with exceptional heart care, we have created designated Provider Care Teams.  These Care Teams include your primary Cardiologist (physician) and Advanced Practice Providers (APPs -  Physician Assistants and Nurse Practitioners) who all work together to provide you with the care you need, when you need it.  We recommend signing up for the patient portal called "MyChart".  Sign up information is provided on this After Visit Summary.  MyChart is used to connect with patients for Virtual Visits  (Telemedicine).  Patients are able to view lab/test results, encounter notes, upcoming appointments, etc.  Non-urgent messages can be sent to your provider  as well.   To learn more about what you can do with MyChart, go to [email or web address].    Your next appointment:   6 month(s)  Provider:   Little Ishikawa, MD     Other Instructions none          Signed, Little Ishikawa, MD  08/22/2023 5:14 PM    [facility]

## 2023-08-22 ENCOUNTER — Encounter: Payer: Self-pay | Admitting: Cardiology

## 2023-08-22 ENCOUNTER — Ambulatory Visit: Payer: Medicare Other | Attending: Cardiology | Admitting: Cardiology

## 2023-08-22 VITALS — BP 158/70 | HR 65 | Ht 65.0 in | Wt 141.0 lb

## 2023-08-22 DIAGNOSIS — I10 Essential (primary) hypertension: Secondary | ICD-10-CM | POA: Diagnosis not present

## 2023-08-22 DIAGNOSIS — I5032 Chronic diastolic (congestive) heart failure: Secondary | ICD-10-CM | POA: Diagnosis not present

## 2023-08-22 DIAGNOSIS — E785 Hyperlipidemia, unspecified: Secondary | ICD-10-CM | POA: Diagnosis not present

## 2023-08-22 DIAGNOSIS — I480 Paroxysmal atrial fibrillation: Secondary | ICD-10-CM | POA: Diagnosis not present

## 2023-08-22 NOTE — Patient Instructions (Signed)
 Medication Instructions:  Continue current medications *If you need a refill on your cardiac medications before your next appointment, please call your pharmacy*   Lab Work: none If you have labs (blood work) drawn today and your tests are completely normal, you will receive your results only by: MyChart Message (if you have MyChart) OR A paper copy in the mail If you have any lab test that is abnormal or we need to change your treatment, we will call you to review the results.   Testing/Procedures: none   Follow-Up: At [facility], you and your health needs are our priority.  As part of our continuing mission to provide you with exceptional heart care, we have created designated Provider Care Teams.  These Care Teams include your primary Cardiologist (physician) and Advanced Practice Providers (APPs -  Physician Assistants and Nurse Practitioners) who all work together to provide you with the care you need, when you need it.  We recommend signing up for the patient portal called "MyChart".  Sign up information is provided on this After Visit Summary.  MyChart is used to connect with patients for Virtual Visits (Telemedicine).  Patients are able to view lab/test results, encounter notes, upcoming appointments, etc.  Non-urgent messages can be sent to your provider as well.   To learn more about what you can do with MyChart, go to [email or web address].    Your next appointment:   6 month(s)  Provider:   Little Ishikawa, MD     Other Instructions none

## 2023-10-03 ENCOUNTER — Encounter: Payer: Self-pay | Admitting: *Deleted

## 2023-10-07 ENCOUNTER — Other Ambulatory Visit: Payer: Self-pay | Admitting: Cardiology

## 2023-10-07 DIAGNOSIS — I4891 Unspecified atrial fibrillation: Secondary | ICD-10-CM

## 2023-10-07 NOTE — Telephone Encounter (Signed)
 Prescription refill request for Eliquis received. Indication: afib  Last office visit: schumann 08/22/2023 Scr: 0.74, 02/14/2023 Age: 89 yo  Weight: 64 kg   Refill sent.

## 2023-10-28 ENCOUNTER — Other Ambulatory Visit: Payer: Self-pay | Admitting: Internal Medicine

## 2023-10-30 LAB — HM DIABETES EYE EXAM

## 2023-11-06 ENCOUNTER — Other Ambulatory Visit: Payer: Self-pay | Admitting: Internal Medicine

## 2023-11-06 DIAGNOSIS — E1169 Type 2 diabetes mellitus with other specified complication: Secondary | ICD-10-CM

## 2023-11-21 ENCOUNTER — Encounter: Payer: Self-pay | Admitting: Internal Medicine

## 2023-11-21 ENCOUNTER — Telehealth: Payer: Self-pay

## 2023-11-21 ENCOUNTER — Ambulatory Visit: Payer: Medicare Other | Attending: Internal Medicine | Admitting: Internal Medicine

## 2023-11-21 VITALS — BP 149/71 | HR 75 | Temp 98.5°F | Ht 65.0 in | Wt 139.0 lb

## 2023-11-21 DIAGNOSIS — E1140 Type 2 diabetes mellitus with diabetic neuropathy, unspecified: Secondary | ICD-10-CM

## 2023-11-21 DIAGNOSIS — Z9181 History of falling: Secondary | ICD-10-CM

## 2023-11-21 DIAGNOSIS — E1159 Type 2 diabetes mellitus with other circulatory complications: Secondary | ICD-10-CM

## 2023-11-21 DIAGNOSIS — I480 Paroxysmal atrial fibrillation: Secondary | ICD-10-CM | POA: Diagnosis not present

## 2023-11-21 DIAGNOSIS — R269 Unspecified abnormalities of gait and mobility: Secondary | ICD-10-CM

## 2023-11-21 DIAGNOSIS — E785 Hyperlipidemia, unspecified: Secondary | ICD-10-CM

## 2023-11-21 DIAGNOSIS — I152 Hypertension secondary to endocrine disorders: Secondary | ICD-10-CM

## 2023-11-21 DIAGNOSIS — E1169 Type 2 diabetes mellitus with other specified complication: Secondary | ICD-10-CM

## 2023-11-21 DIAGNOSIS — E538 Deficiency of other specified B group vitamins: Secondary | ICD-10-CM

## 2023-11-21 LAB — POCT GLYCOSYLATED HEMOGLOBIN (HGB A1C): HbA1c, POC (controlled diabetic range): 6.2 % (ref 0.0–7.0)

## 2023-11-21 LAB — GLUCOSE, POCT (MANUAL RESULT ENTRY): POC Glucose: 100 mg/dL — AB (ref 70–99)

## 2023-11-21 NOTE — Progress Notes (Signed)
 Patient ID: Ruth Delgado, female    DOB: [birth year]  MRN: [id number]  CC: Diabetes (DM f/u. /No questions / concerns/No to pneumonia vax)   Subjective: Ruth Delgado is a 89 y.o. female who presents for chronic ds management. Her concerns today include:  Pt with hx of HTN, HL, PAF on anticoagulation, chronic diastolic CHF, DM type 2, GERD, vit D def, DVT RT leg, gout, depression, gout,  DCIS RT breast ER/PR +   Lost her dog 2 mths ago so feeling a little down from that  DM: Results for orders placed or performed in visit on 11/21/23  POCT glucose (manual entry)   Collection Time: 11/21/23  1:44 PM  Result Value Ref Range   POC Glucose 100 (A) 70 - 99 mg/dl  POCT glycosylated hemoglobin (Hb A1C)   Collection Time: 11/21/23  1:52 PM  Result Value Ref Range   Hemoglobin A1C     HbA1c POC (<> result, manual entry)     HbA1c, POC (prediabetic range)     HbA1c, POC (controlled diabetic range) 6.2 0.0 - 7.0 %  Checks BS once a wk.  Has log.  Range 106-144 in the afternoons Does okay with eating habits but over indulged during the easter. Still walks daily. Legs stiff and feels balance off when she first stands up from beds in a.m.  Has to hold on to something once she gets out of bed.  After a few steps she is find. Had reported fall on last visit.  No fall since last visit. Was offered referral to P.T on last visit for safety training and balance training.  She declined and declines again today.  Reports daily walks have help. -would like shower bar and shower chair.   -Vitamin D  B12 level found to be in the low normal range on last visit.  I recommended purchasing vitamin B12 over-the-counter and taking daily.  Patient states she took it for about a month or 2 and once it ran out she forgot which vitamin it was; so have been off it for about 2 mths -we spoke about getting Life Alert on last visit.  Our CW looked into it and tried reaching her unsuccessfully. She is here today and can  meet with pt.  Breast CA:  Reports night sweats.  Told by her cancer specialist due to Letrozole .  Started taking it at a different time of day and the night sweats resolved -has MMG scheduled for 1-2 mths from now.   HTN/diastolic CHF/PAF: Saw Dr. Alda Amas her cardiologist in January. No changes made in medications. Checks BP 1-3x/wk using arm cuff.  Has log today: 121/64, 130/71, 138/68, 135/80, 123/64.  Limits salt Reports compliance with diltiazem  360 mg daily, furosemide  20 mg daily, Cozaar  50 mg daily and Eliquis .  No bleeding on Eliquis  but bruise easily on extremities.   HL: On pravastatin  40 mg daily. LDL in Dec was 46  Daughters still concern about her memory. Repeats herself at times. No safety issues. MMSE on last visit score 30/30 Decline referral for neuropsychiatric eval on last visit  HM:  Saw Dr. Madelyn Schick in October for eye exam  Patient Active Problem List   Diagnosis Date Noted   Hyperglycemia due to type 2 diabetes mellitus (HCC) 01/02/2022   Hypercholesterolemia 01/02/2022   History of shingles 01/02/2022   Essential hypertension 05/24/2021   Intraductal carcinoma in situ of right breast 05/23/2021   Ductal carcinoma in situ (DCIS) of right breast 05/19/2021  Paroxysmal atrial fibrillation (HCC) 12/09/2020   Hypercoagulable state due to paroxysmal atrial fibrillation (HCC) 12/09/2020   Atrial fibrillation with RVR (HCC) 11/15/2020   Nausea, vomiting and diarrhea 11/15/2020   Chronic atrial fibrillation (HCC) 11/15/2020   CKD stage 2 due to type 2 diabetes mellitus (HCC) 03/06/2018   Overweight (BMI 25.0-29.9) 03/06/2018   Osteopenia 03/06/2018   Esophageal reflux 09/06/2015   Vitamin D  deficiency 06/08/2013   Medication management 06/08/2013   Hypertension    Hyperlipemia    Gout      Current Outpatient Medications on File Prior to Visit  Medication Sig Dispense Refill   acetaminophen  (TYLENOL ) 650 MG CR tablet Take 650 mg by mouth every 8 (eight)  hours as needed for pain.     allopurinol  (ZYLOPRIM ) 300 MG tablet TAKE 1 TABLET BY MOUTH DAILY TO PREVENT GOUT 90 tablet 1   apixaban  (ELIQUIS ) 5 MG TABS tablet TAKE 1 TABLET BY MOUTH 2 TIMES DAILY. 180 tablet 1   cholecalciferol (VITAMIN D ) 1000 UNITS tablet Take 5,000 Units by mouth every other day.      citalopram  (CELEXA ) 20 MG tablet TAKE 1 TABLET (20 MG TOTAL) BY MOUTH DAILY. 30 tablet 0   diltiazem  (CARDIZEM  CD) 360 MG 24 hr capsule TAKE 1 CAPSULE (360 MG TOTAL) BY MOUTH DAILY. 90 capsule 3   furosemide  (LASIX ) 20 MG tablet TAKE 1 TABLET (20 MG TOTAL) BY MOUTH DAILY. 90 tablet 3   Lancets (FREESTYLE) lancets   12   letrozole  (FEMARA ) 2.5 MG tablet TAKE 1 TABLET (2.5 MG TOTAL) BY MOUTH DAILY. 90 tablet 3   losartan  (COZAAR ) 50 MG tablet Take 1 tablet (50 mg total) by mouth daily. 90 tablet 3   pravastatin  (PRAVACHOL ) 40 MG tablet TAKE 1 TABLET BY MOUTH DAILY. 30 tablet 0   Simethicone (GAS-X PO) Take 2 tablets by mouth daily as needed (bloating).     No current facility-administered medications on file prior to visit.    Allergies  Allergen Reactions   Buspar [Buspirone]     dysphoria   Lipitor [Atorvastatin]     Myalgias   Metformin And Related     Gas/bloating   Metformin Hcl Other (See Comments)   Nsaids     GI upset   Sulfa Antibiotics Other (See Comments)    Reaction=throat itching   Sulfacetamide Sodium-Sulfur Other (See Comments)   Sulfonamide Derivatives     Social History   Socioeconomic History   Marital status: Widowed    Spouse name: Not on file   Number of children: Not on file   Years of education: Not on file   Highest education level: Not on file  Occupational History   Occupation: retired  Tobacco Use   Smoking status: Former    Current packs/day: 0.00    Average packs/day: 0.5 packs/day for 25.0 years (12.5 ttl pk-yrs)    Types: Cigarettes    Start date: 07/30/1981    Quit date: 07/30/2006    Years since quitting: 17.3   Smokeless tobacco: Never    Tobacco comments:    Former smoker 07/04/2021  Substance and Sexual Activity   Alcohol use: Not Currently    Alcohol/week: 2.0 standard drinks of alcohol    Types: 2 Standard drinks or equivalent per week    Comment: occasional, none in 3 months   Drug use: Never   Sexual activity: Not on file  Other Topics Concern   Not on file  Social History Narrative   Not on  file   Social Drivers of Health   Financial Resource Strain: Low Risk  (04/02/2023)   Overall Financial Resource Strain (CARDIA)    Difficulty of Paying Living Expenses: Not hard at all  Food Insecurity: No Food Insecurity (04/02/2023)   Hunger Vital Sign    Worried About Running Out of Food in the Last Year: Never true    Ran Out of Food in the Last Year: Never true  Transportation Needs: No Transportation Needs (04/02/2023)   PRAPARE - Administrator, Civil Service (Medical): No    Lack of Transportation (Non-Medical): No  Physical Activity: Sufficiently Active (04/02/2023)   Exercise Vital Sign    Days of Exercise per Week: 5 days    Minutes of Exercise per Session: 30 min  Stress: No Stress Concern Present (04/02/2023)   [employer] of Occupational Health - Occupational Stress Questionnaire    Feeling of Stress : Not at all  Social Connections: Moderately Integrated (04/02/2023)   Social Connection and Isolation Panel [NHANES]    Frequency of Communication with Friends and Family: More than three times a week    Frequency of Social Gatherings with Friends and Family: Twice a week    Attends Religious Services: 1 to 4 times per year    Active Member of [employer] or Organizations: Yes    Attends Banker Meetings: More than 4 times per year    Marital Status: Widowed  Intimate Partner Violence: Not At Risk (04/02/2023)   Humiliation, Afraid, Rape, and Kick questionnaire    Fear of Current or Ex-Partner: No    Emotionally Abused: No    Physically Abused: No    Sexually Abused: No    Family  History  Problem Relation Age of Onset   Diabetes Mother        suicide at age 69   Hypertension Father    CVA Father     Past Surgical History:  Procedure Laterality Date   ABDOMINAL HYSTERECTOMY     BIOPSY BREAST     (L) BREAST IN 1993   CATARACT EXTRACTION Bilateral    04/22/23 RT and 05/06/2023   KNEE ARTHROSCOPY      ROS: Review of Systems Negative except as stated above  PHYSICAL EXAM: BP (!) 149/71 (BP Location: Left Arm, Patient Position: Sitting, Cuff Size: Normal)   Pulse 75   Temp 98.5 F (36.9 C) (Oral)   Ht 5\' 5"  (1.651 m)   Wt 139 lb (63 kg)   SpO2 98%   BMI 23.13 kg/m   Wt Readings from Last 3 Encounters:  11/21/23 139 lb (63 kg)  08/22/23 141 lb (64 kg)  07/18/23 142 lb (64.4 kg)  BP 152/76  Physical Exam  General appearance - alert, well appearing, and in no distress Mental status - normal mood, behavior, speech, dress, motor activity, and thought processes Neck - supple, no significant adenopathy Chest - clear to auscultation, no wheezes, rales or rhonchi, symmetric air entry Heart - normal rate, regular rhythm, normal S1, S2, no murmurs, rubs, clicks or gallops Neurological -patient noted to be a little unsteady and has to hold onto the wall when she first stands up.  It is also noted that she stands up very quickly.  Gait: Slow and sometimes trips over the other foot Musculoskeletal -power in the lower extremities 5/5 bilaterally Extremities - peripheral pulses normal, no pedal edema, no clubbing or cyanosis Skin: Patches of ecchymosis on the forearm which she attributes to sensitivity  when bumping into things being on Eliquis .     Latest Ref Rng & Units 02/14/2023    2:08 PM 08/01/2022    9:18 AM 03/05/2022    2:14 PM  CMP  Glucose 70 - 99 mg/dL 454  098  99   BUN 8 - 27 mg/dL 13  17  16    Creatinine 0.57 - 1.00 mg/dL 1.19  1.47  8.29   Sodium 134 - 144 mmol/L 141  141  139   Potassium 3.5 - 5.2 mmol/L 3.9  3.7  4.5   Chloride 96 - 106 mmol/L  101  102  100   CO2 20 - 29 mmol/L 26  26  27    Calcium 8.7 - 10.3 mg/dL 9.0  9.2  9.8   Total Protein 6.0 - 8.5 g/dL  6.6    Total Bilirubin 0.0 - 1.2 mg/dL  0.5    Alkaline Phos 44 - 121 IU/L  50    AST 0 - 40 IU/L  23    ALT 0 - 32 IU/L  23     Lipid Panel     Component Value Date/Time   CHOL 169 07/18/2023 1500   TRIG 100 07/18/2023 1500   HDL 72 07/18/2023 1500   CHOLHDL 2.3 07/18/2023 1500   CHOLHDL 3.2 09/02/2020 1035   VLDL 28 01/14/2017 1114   LDLCALC 79 07/18/2023 1500   LDLCALC 99 09/02/2020 1035    CBC    Component Value Date/Time   WBC 7.8 07/18/2023 1500   WBC 6.9 05/24/2021 0808   WBC 7.4 12/01/2020 1615   RBC 4.26 07/18/2023 1500   RBC 4.06 05/24/2021 0808   HGB 14.2 07/18/2023 1500   HCT 41.6 07/18/2023 1500   PLT 214 07/18/2023 1500   MCV 98 (H) 07/18/2023 1500   MCH 33.3 (H) 07/18/2023 1500   MCH 33.0 05/24/2021 0808   MCHC 34.1 07/18/2023 1500   MCHC 33.8 05/24/2021 0808   RDW 12.6 07/18/2023 1500   LYMPHSABS 2.4 05/24/2021 0808   MONOABS 0.5 05/24/2021 0808   EOSABS 0.1 05/24/2021 0808   BASOSABS 0.1 05/24/2021 0808   Vitamin B12 level 4 months ago was 389 ASSESSMENT AND PLAN: 1. Controlled type 2 diabetes with neuropathy (HCC) (Primary) At goal.  Encouraged her to continue healthy eating habits. Continue to be active. - POCT glycosylated hemoglobin (Hb A1C) - POCT glucose (manual entry)  2. Hypertension associated with type 2 diabetes mellitus (HCC) Patient has component of whitecoat hypertension with good home blood pressure readings.  She admits that she always gets nervous when she comes to my office.  No changes made in medications.  She will continue the Cardizem  360 mg daily, furosemide  20 mg daily, Cozaar  50 mg daily  3. PAF (paroxysmal atrial fibrillation) (HCC) Currently in sinus rhythm by auscultation.  Continue Eliquis  and Cardizem .  Followed by cardiology.  4. Hyperlipidemia associated with type 2 diabetes mellitus  (HCC) Continue pravastatin  40 mg daily  5. History of fall Given her previous history of falls and gait disturbance observed today, I recommend referral to physical therapy for gait safety training and balance evaluation.  Patient again declines. -I recommend that she go slow when going from seated to standing position. -She reports having a cane at home.  I recommend using it when she is going to stand up and take it with her when she anticipates doing a lot of walking. -She is agreeable for me to send a prescription to [facility] for  a shower chair and for shower bar. Our caseworker was here today to meet with her at to discuss getting life alert. - For home use only DME Other see comment  6. Gait disturbance See #5 - For home use only DME Other see comment  7. Vitamin B12 deficiency She has relative B12 insufficiency.  Informed her that if this becomes full deficiency, this can play a role in gait disturbance.  I wrote down for her on the discharge summary in bold to purchase vitamin B12 500 mcg over-the-counter and take 1 daily.  Patient was given the opportunity to ask questions.  Patient verbalized understanding of the plan and was able to repeat key elements of the plan.   This documentation was completed using Paediatric nurse.  Any transcriptional errors are unintentional.  Orders Placed This Encounter  Procedures   For home use only DME Other see comment   POCT glycosylated hemoglobin (Hb A1C)   POCT glucose (manual entry)     Requested Prescriptions    No prescriptions requested or ordered in this encounter    Return in about 4 months (around 03/22/2024).  Concetta Dee, MD, FACP

## 2023-11-21 NOTE — Telephone Encounter (Signed)
 At the request of Dr Lincoln Renshaw, I met with the patient when she was in the clinic.  She was interested in Life Alert but did not think she needed it now.  I told her that I am not sure if her insurance would cover it as an extra benefit and I could refer her to [facility] for assistance with navigating her insurance benefits but she declined. She said she did not want anyone calling her.    I was able to provide her with the contact number for Life Alert as well as [facility] Medicare member benefits and instructed her to call for more information.  She was very appreciative of the contact information. I explained to her that she can call me if she has any further questions and she said she understood

## 2023-11-21 NOTE — Patient Instructions (Addendum)
 Vitamin B12 supplement 500 mcg over-the-counter and take 1 daily.  I recommend using your cane when getting up from chair and when you anticipate walking long distances.

## 2023-11-27 ENCOUNTER — Other Ambulatory Visit: Payer: Self-pay | Admitting: Internal Medicine

## 2023-11-28 ENCOUNTER — Encounter: Payer: Self-pay | Admitting: Hematology and Oncology

## 2023-12-10 ENCOUNTER — Other Ambulatory Visit: Payer: Self-pay | Admitting: Internal Medicine

## 2023-12-10 DIAGNOSIS — E785 Hyperlipidemia, unspecified: Secondary | ICD-10-CM

## 2023-12-13 ENCOUNTER — Telehealth: Payer: Self-pay

## 2023-12-13 NOTE — Telephone Encounter (Signed)
 Let pt know that I received message from doctor at [facility] who saw her today for fecal incontinence.  Find out if she is okay with me submitting referral for her to see a gastroenterologist.  The rest of this is documentation from my phone call with UC physician. Call returned to Dr. Rachael Budd today.  He states that patient was seen there today for intermittent fecal incontinence that has been going on for 6 months but became more frequent over the past week.  She wears depends undergarments and woke up this morning with the undergarment completely soaked with feces and she thought it looked pink.  This alarmed her and so she went to urgent care and brought the Depends that she wore last night with her.  They do not have fecal Hemoccults but he states that the stool in the diaper did not appear to have blood in it or melanotic. Request that she f/u with me or refer to GI.

## 2023-12-13 NOTE — Telephone Encounter (Addendum)
 Spoke with Dr. Luciano Ruths from [facility]. Dr. Lavonia Powers voiced that he examined patient today. He called to report  patient is having passage fecal incontinence for several weeks. Dr. Luciano Ruths  reports  that patient is having normal bowel movements  in between her episodes of fecal incontinence. Dr. Luciano Ruths  voiced concerns and feels like she needs an GI referral ASAP. Please advise.

## 2023-12-13 NOTE — Telephone Encounter (Signed)
 Copied from CRM [id number]. Topic: General - Call Back - No Documentation >> Dec 13, 2023  1:00 PM [employer] wrote: Reason for CRM: Dr. Luciano Ruths from [facility] is requesting a call back from Dr. Lincoln Renshaw to speak about patient's Fecal incontinence that has been going on for about 6 months and is accelerating. He recommends a GI Dr.  Gracelyn Laurence #: [phone]

## 2023-12-16 ENCOUNTER — Emergency Department (HOSPITAL_COMMUNITY)
Admission: EM | Admit: 2023-12-16 | Discharge: 2023-12-16 | Disposition: A | Discharge status: Home / Self Care | Attending: Emergency Medicine | Admitting: Emergency Medicine

## 2023-12-16 ENCOUNTER — Other Ambulatory Visit: Payer: Self-pay

## 2023-12-16 ENCOUNTER — Encounter (HOSPITAL_COMMUNITY): Payer: Self-pay | Admitting: *Deleted

## 2023-12-16 DIAGNOSIS — L03116 Cellulitis of left lower limb: Secondary | ICD-10-CM | POA: Insufficient documentation

## 2023-12-16 DIAGNOSIS — Z79899 Other long term (current) drug therapy: Secondary | ICD-10-CM | POA: Diagnosis not present

## 2023-12-16 DIAGNOSIS — Z7901 Long term (current) use of anticoagulants: Secondary | ICD-10-CM | POA: Diagnosis not present

## 2023-12-16 DIAGNOSIS — I5032 Chronic diastolic (congestive) heart failure: Secondary | ICD-10-CM | POA: Diagnosis not present

## 2023-12-16 DIAGNOSIS — I110 Hypertensive heart disease with heart failure: Secondary | ICD-10-CM | POA: Diagnosis not present

## 2023-12-16 DIAGNOSIS — E119 Type 2 diabetes mellitus without complications: Secondary | ICD-10-CM | POA: Insufficient documentation

## 2023-12-16 LAB — CBC WITH DIFFERENTIAL/PLATELET
Abs Immature Granulocytes: 0.02 10*3/uL (ref 0.00–0.07)
Basophils Absolute: 0.1 10*3/uL (ref 0.0–0.1)
Basophils Relative: 1 %
Eosinophils Absolute: 0 10*3/uL (ref 0.0–0.5)
Eosinophils Relative: 1 %
HCT: 41.5 % (ref 36.0–46.0)
Hemoglobin: 13.7 g/dL (ref 12.0–15.0)
Immature Granulocytes: 0 %
Lymphocytes Relative: 22 %
Lymphs Abs: 1.7 10*3/uL (ref 0.7–4.0)
MCH: 32.7 pg (ref 26.0–34.0)
MCHC: 33 g/dL (ref 30.0–36.0)
MCV: 99 fL (ref 80.0–100.0)
Monocytes Absolute: 0.5 10*3/uL (ref 0.1–1.0)
Monocytes Relative: 6 %
Neutro Abs: 5.3 10*3/uL (ref 1.7–7.7)
Neutrophils Relative %: 70 %
Platelets: 181 10*3/uL (ref 150–400)
RBC: 4.19 MIL/uL (ref 3.87–5.11)
RDW: 13.8 % (ref 11.5–15.5)
WBC: 7.6 10*3/uL (ref 4.0–10.5)
nRBC: 0 % (ref 0.0–0.2)

## 2023-12-16 LAB — COMPREHENSIVE METABOLIC PANEL WITH GFR
ALT: 13 U/L (ref 0–44)
AST: 19 U/L (ref 15–41)
Albumin: 3.8 g/dL (ref 3.5–5.0)
Alkaline Phosphatase: 45 U/L (ref 38–126)
Anion gap: 6 (ref 5–15)
BUN: 13 mg/dL (ref 8–23)
CO2: 30 mmol/L (ref 22–32)
Calcium: 9 mg/dL (ref 8.9–10.3)
Chloride: 105 mmol/L (ref 98–111)
Creatinine, Ser: 0.8 mg/dL (ref 0.44–1.00)
GFR, Estimated: >60 mL/min (ref >60)
Glucose, Bld: 91 mg/dL (ref 70–99)
Potassium: 3.6 mmol/L (ref 3.5–5.1)
Sodium: 141 mmol/L (ref 135–145)
Total Bilirubin: 0.8 mg/dL (ref 0.0–1.2)
Total Protein: 6.6 g/dL (ref 6.5–8.1)

## 2023-12-16 LAB — LIPASE, BLOOD: Lipase: 29 U/L (ref 11–51)

## 2023-12-16 MED ORDER — DOXYCYCLINE HYCLATE 100 MG PO CAPS: 100.0000 mg | ORAL_CAPSULE | Freq: Two times a day (BID) | ORAL | 0 refills | Status: DC

## 2023-12-16 NOTE — ED Provider Notes (Signed)
 [place] EMERGENCY DEPARTMENT AT [facility] Provider Note   CSN: [id number] Arrival date & time: 12/16/23  4098     History  Chief Complaint  Patient presents with   Leg Injury    Ruth Delgado is a 89 y.o. female.  HPI     89 year old female with a history of hypertension, hyperlipidemia, paroxysmal atrial fibrillation on Eliquis , chronic diastolic congestive heart failure, type 2 diabetes, GERD, vitamin D  deficiency, DVT of the right leg, gout, depression, DCIS, who presents with concern for swelling and pain to an area of her left lower leg.  Additionally reports Thursday had some abdomimal cramping, diarrhea with pink coloring, then had normal coloring No continued abdominal pain Now not had BM for 2 days.  Does say the symptoms have improved.  Yesterday noticed area of swelling and redness to lower leg. Not sure if she had a bite-was bit by a fly in the back of her neck or stung by something but doesn recall in her leg. Have seen ticks around the house, not on herself. No fever, nausea, vomiting, dyspnea nor chest pain   Taking eliquis   Home Medications Prior to Admission medications   Medication Sig Start Date End Date Taking? Authorizing Provider  acetaminophen  (TYLENOL ) 650 MG CR tablet Take 650 mg by mouth every 8 (eight) hours as needed for pain.    [provider]  allopurinol  (ZYLOPRIM ) 300 MG tablet TAKE 1 TABLET BY MOUTH DAILY TO PREVENT GOUT 08/05/23   Lawrance Presume, MD  apixaban  (ELIQUIS ) 5 MG TABS tablet TAKE 1 TABLET BY MOUTH 2 TIMES DAILY. 10/07/23   Wendie Hamburg, MD  cholecalciferol (VITAMIN D ) 1000 UNITS tablet Take 5,000 Units by mouth every other day.     [provider]  citalopram  (CELEXA ) 20 MG tablet Take 1 tablet (20 mg total) by mouth daily. 11/27/23   Lawrance Presume, MD  diltiazem  (CARDIZEM  CD) 360 MG 24 hr capsule TAKE 1 CAPSULE (360 MG TOTAL) BY MOUTH DAILY. 03/22/23   Wendie Hamburg, MD   furosemide  (LASIX ) 20 MG tablet TAKE 1 TABLET (20 MG TOTAL) BY MOUTH DAILY. 04/09/23   Wendie Hamburg, MD  Lancets (FREESTYLE) lancets  02/10/16   [provider]  letrozole  (FEMARA ) 2.5 MG tablet TAKE 1 TABLET (2.5 MG TOTAL) BY MOUTH DAILY. 05/08/23   Gudena, Vinay, MD  losartan  (COZAAR ) 50 MG tablet Take 1 tablet (50 mg total) by mouth daily. 04/09/23   Wendie Hamburg, MD  pravastatin  (PRAVACHOL ) 40 MG tablet TAKE 1 TABLET BY MOUTH DAILY. 12/11/23   Lawrance Presume, MD  Simethicone (GAS-X PO) Take 2 tablets by mouth daily as needed (bloating).    [provider]      Allergies    Buspar [buspirone], Lipitor [atorvastatin], Metformin and related, Metformin hcl, Nsaids, Sulfa antibiotics, Sulfacetamide sodium-sulfur, and Sulfonamide derivatives    Review of Systems   Review of Systems  Physical Exam Updated Vital Signs BP (!) 160/69 (BP Location: Right Arm)   Pulse 79   Temp 97.8 F (36.6 C) (Oral)   Resp 15   Ht 5\' 5"  (1.651 m)   Wt 64.9 kg   SpO2 98%   BMI 23.80 kg/m  Physical Exam Vitals and nursing note reviewed.  Constitutional:      General: She is not in acute distress.    Appearance: Normal appearance. She is not ill-appearing, toxic-appearing or diaphoretic.  HENT:     Head: Normocephalic.  Eyes:  Conjunctiva/sclera: Conjunctivae normal.  Cardiovascular:     Rate and Rhythm: Normal rate and regular rhythm.     Pulses: Normal pulses.  Pulmonary:     Effort: Pulmonary effort is normal. No respiratory distress.  Musculoskeletal:        General: No deformity or signs of injury.     Cervical back: No rigidity.  Skin:    General: Skin is warm and dry.     Coloration: Skin is not jaundiced or pale.     Findings: Erythema (see photo, no underlying fluctuance. 3 cm diameter) present.  Neurological:     General: No focal deficit present.     Mental Status: She is alert and oriented to person, place, and time.       ED  Results / Procedures / Treatments   Labs (all labs ordered are listed, but only abnormal results are displayed) Labs Reviewed  CBC WITH DIFFERENTIAL/PLATELET  COMPREHENSIVE METABOLIC PANEL WITH GFR  LIPASE, BLOOD    EKG None  Radiology No results found.  Procedures Procedures    Medications Ordered in ED Medications - No data to display  ED Course/ Medical Decision Making/ A&P                                  89 year old female with a history of hypertension, hyperlipidemia, paroxysmal atrial fibrillation on Eliquis , chronic diastolic congestive heart failure, type 2 diabetes, GERD, vitamin D  deficiency, DVT of the right leg, gout, depression, DCIS, who presents with concern for swelling and pain to an area of her left lower leg.  Normal pulses present bilaterally and have low suspicion for acute arterial thrombus.  Low suspicion for DVT given she is anticoagulated and has limited area of swelling and discoloration more consistent with insect bite, localized reaction and possible overlying infection.  No signs of underlying fluctuance.  No systemic symptoms.  She had described having pink diarrhea over the weekend that has since resolved.  Lab work was obtained and personally evaluated interpreted by me showed no clinically significant changes.  Will give prescription for doxycycline  for this area of pain and swelling to treat cellulitis and also empirically cover given concern for possible tick bite. Patient discharged in stable condition with understanding of reasons to return.         Final Clinical Impression(s) / ED Diagnoses Final diagnoses:  None    Rx / DC Orders ED Discharge Orders     None         Scarlette Currier, MD 12/17/23 [phone]

## 2023-12-16 NOTE — ED Triage Notes (Signed)
 Last thurs c/o abd. Pain with diarrhea , stool was pink in color thought it was eating watermelon, went to [facility] in [place], took a stool sample wasn't able to dx. Went home went away by eating a bland diet , yest after church left leg started hurting with hot burised area on her leg, poss. Bug bite. No pain just c/o soreness

## 2023-12-17 ENCOUNTER — Telehealth: Payer: Self-pay | Admitting: Internal Medicine

## 2023-12-17 NOTE — Telephone Encounter (Signed)
 Called & spoke to the patient. Verified name & DOB. Patient appointment scheduled for 02/13/2024 at 3:50 pm.  Copied from CRM [id number]. Topic: Appointments - Appointment Scheduling >> Dec 16, 2023  4:47 PM [place] wrote: Patient needs an ER follow up for possible tick bite- nothing available until June- please call to schedule [phone]

## 2024-02-05 ENCOUNTER — Other Ambulatory Visit: Payer: Self-pay | Admitting: Internal Medicine

## 2024-02-05 DIAGNOSIS — Z8739 Personal history of other diseases of the musculoskeletal system and connective tissue: Secondary | ICD-10-CM

## 2024-02-13 ENCOUNTER — Ambulatory Visit: Attending: Internal Medicine | Admitting: Internal Medicine

## 2024-02-13 VITALS — BP 167/74 | HR 66 | Ht 65.0 in | Wt 135.0 lb

## 2024-02-13 DIAGNOSIS — R233 Spontaneous ecchymoses: Secondary | ICD-10-CM | POA: Diagnosis not present

## 2024-02-13 DIAGNOSIS — I10 Essential (primary) hypertension: Secondary | ICD-10-CM | POA: Diagnosis not present

## 2024-02-13 DIAGNOSIS — F411 Generalized anxiety disorder: Secondary | ICD-10-CM | POA: Diagnosis not present

## 2024-02-13 DIAGNOSIS — I152 Hypertension secondary to endocrine disorders: Secondary | ICD-10-CM

## 2024-02-13 DIAGNOSIS — Z6379 Other stressful life events affecting family and household: Secondary | ICD-10-CM | POA: Diagnosis not present

## 2024-02-13 NOTE — Patient Instructions (Signed)
 VISIT SUMMARY:  During your visit, we discussed your concerns about bruising and a recent tick bite. We reviewed your medications and overall health, including your blood pressure and anxiety management.  YOUR PLAN:  -TICK BITE: A tick bite can sometimes cause a red, swollen area that may look like a bull's eye. Your lesion has resolved after completing the doxycycline  treatment, which helped prevent Lyme disease.  -BRUISING: The increased bruising on your skin is likely due to your medication, Eliquis . We will check your platelet count with a blood test to rule out other causes.  -HYPERTENSION: Hypertension, or high blood pressure, is generally controlled with your current medications. The recent elevation in your blood pressure may be due to stress. Please monitor your blood pressure over the weekend and report your readings on Monday. We will reassess your treatment plan if your blood pressure remains elevated.  -ANXIETY: Anxiety can be heightened by stress. You are currently taking citalopram  20 mg daily. We discussed that increasing the dose is not advisable due to potential side effects, so please continue with your current dose.  -SAFETY CONCERNS: Living alone can present safety concerns. We discussed the option of a life alert system for emergencies, which you may want to consider discussing with your family.  INSTRUCTIONS:  Please monitor your blood pressure over the weekend and report your readings on Monday. We will check your platelet count with a blood test to rule out other causes of bruising. Consider discussing a life alert system with your family for emergency situations.

## 2024-02-13 NOTE — Progress Notes (Signed)
 Patient ID: Ruth Delgado, female    DOB: [birth year]  MRN: [id number]  CC: Follow-up (Emergency f/u. /No questions / concerns/)   Subjective: Ruth Delgado is a 89 y.o. female who presents for ER f/u. Her concerns today include:  Pt with hx of HTN, HL, PAF on anticoagulation, chronic diastolic CHF, DM type 2, GERD, vit D def, DVT RT leg, gout, depression, gout,  DCIS RT breast ER/PR +   Discussed the use of AI scribe software for clinical note transcription with the patient, who gave verbal consent to proceed.  History of Present Illness Ruth Delgado is an 89 year old female who presents as f/u from ER visit.  She was in the emergency room on May 19th due to a red, swollen, and painful spot on her left leg after working in her garden. The area was aching, red, and swollen, with small pimples appearing. The emergency room staff suspected a tick bite, noting a bull's eye appearance, and treated her with doxycycline , which she completed. She reports the condition has resolved.  She is experiencing new bruising and spots on her skin, including her face and arms, and wonders if this could be related to her medication, Eliquis . No aspirin use. The bruising has worsened over the past two to three months.  She monitors her blood pressure at home, reporting generally good readings except for a recent elevation to 143/70. Some of her recorded recent readings are 123/69, 125/66, 134/70, 123/69 and 125/66.  Her medications include losartan  50 mg daily, furosemide  20 mg daily, and diltiazem  360 mg daily. She limits her salt intake, using it sparingly. No chest pain or shortness of breath.  She is on citalopram  20 mg for anxiety, which wonders whether she may need adjustment due to increased nervousness related to family stressors. Her family history includes her daughter with multiple myeloma and her son-in-law with cancer and a history of heart attack. She lives alone with her two cats and is  supported by her neighbor, whom she refers to as her 'adopted daughter'.      Patient Active Problem List   Diagnosis Date Noted   Hyperglycemia due to type 2 diabetes mellitus (HCC) 01/02/2022   Hypercholesterolemia 01/02/2022   History of shingles 01/02/2022   Essential hypertension 05/24/2021   Intraductal carcinoma in situ of right breast 05/23/2021   Ductal carcinoma in situ (DCIS) of right breast 05/19/2021   Paroxysmal atrial fibrillation (HCC) 12/09/2020   Hypercoagulable state due to paroxysmal atrial fibrillation (HCC) 12/09/2020   Atrial fibrillation with RVR (HCC) 11/15/2020   Nausea, vomiting and diarrhea 11/15/2020   Chronic atrial fibrillation (HCC) 11/15/2020   CKD stage 2 due to type 2 diabetes mellitus (HCC) 03/06/2018   Overweight (BMI 25.0-29.9) 03/06/2018   Osteopenia 03/06/2018   Esophageal reflux 09/06/2015   Vitamin D  deficiency 06/08/2013   Medication management 06/08/2013   Hypertension    Hyperlipemia    Gout      Current Outpatient Medications on File Prior to Visit  Medication Sig Dispense Refill   acetaminophen  (TYLENOL ) 650 MG CR tablet Take 650 mg by mouth every 8 (eight) hours as needed for pain.     allopurinol  (ZYLOPRIM ) 300 MG tablet TAKE 1 TABLET BY MOUTH DAILY TO PREVENT GOUT 90 tablet 1   apixaban  (ELIQUIS ) 5 MG TABS tablet TAKE 1 TABLET BY MOUTH 2 TIMES DAILY. 180 tablet 1   cholecalciferol (VITAMIN D ) 1000 UNITS tablet Take 5,000 Units by mouth every other  day.      citalopram  (CELEXA ) 20 MG tablet Take 1 tablet (20 mg total) by mouth daily. 90 tablet 1   diltiazem  (CARDIZEM  CD) 360 MG 24 hr capsule TAKE 1 CAPSULE (360 MG TOTAL) BY MOUTH DAILY. 90 capsule 3   furosemide  (LASIX ) 20 MG tablet TAKE 1 TABLET (20 MG TOTAL) BY MOUTH DAILY. 90 tablet 3   Lancets (FREESTYLE) lancets   12   letrozole  (FEMARA ) 2.5 MG tablet TAKE 1 TABLET (2.5 MG TOTAL) BY MOUTH DAILY. 90 tablet 3   losartan  (COZAAR ) 50 MG tablet Take 1 tablet (50 mg total) by  mouth daily. 90 tablet 3   pravastatin  (PRAVACHOL ) 40 MG tablet TAKE 1 TABLET BY MOUTH DAILY. 90 tablet 2   Simethicone (GAS-X PO) Take 2 tablets by mouth daily as needed (bloating).     No current facility-administered medications on file prior to visit.    Allergies  Allergen Reactions   Buspar [Buspirone]     dysphoria   Lipitor [Atorvastatin]     Myalgias   Metformin And Related     Gas/bloating   Metformin Hcl Other (See Comments)   Nsaids     GI upset   Sulfa Antibiotics Other (See Comments)    Reaction=throat itching   Sulfacetamide Sodium-Sulfur Other (See Comments)   Sulfonamide Derivatives     Social History   Socioeconomic History   Marital status: Widowed    Spouse name: Not on file   Number of children: Not on file   Years of education: Not on file   Highest education level: Not on file  Occupational History   Occupation: retired  Tobacco Use   Smoking status: Former    Current packs/day: 0.00    Average packs/day: 0.5 packs/day for 25.0 years (12.5 ttl pk-yrs)    Types: Cigarettes    Start date: 07/30/1981    Quit date: 07/30/2006    Years since quitting: 17.5   Smokeless tobacco: Never   Tobacco comments:    Former smoker 07/04/2021  Substance and Sexual Activity   Alcohol use: Not Currently    Alcohol/week: 2.0 standard drinks of alcohol    Types: 2 Standard drinks or equivalent per week    Comment: occasional, none in 3 months   Drug use: Never   Sexual activity: Not on file  Other Topics Concern   Not on file  Social History Narrative   Not on file   Social Drivers of Health   Financial Resource Strain: Low Risk  (04/02/2023)   Overall Financial Resource Strain (CARDIA)    Difficulty of Paying Living Expenses: Not hard at all  Food Insecurity: No Food Insecurity (04/02/2023)   Hunger Vital Sign    Worried About Running Out of Food in the Last Year: Never true    Ran Out of Food in the Last Year: Never true  Transportation Needs: No  Transportation Needs (04/02/2023)   PRAPARE - Administrator, Civil Service (Medical): No    Lack of Transportation (Non-Medical): No  Physical Activity: Sufficiently Active (04/02/2023)   Exercise Vital Sign    Days of Exercise per Week: 5 days    Minutes of Exercise per Session: 30 min  Stress: No Stress Concern Present (04/02/2023)   [employer] of Occupational Health - Occupational Stress Questionnaire    Feeling of Stress : Not at all  Social Connections: Moderately Integrated (04/02/2023)   Social Connection and Isolation Panel    Frequency of Communication with  Friends and Family: More than three times a week    Frequency of Social Gatherings with Friends and Family: Twice a week    Attends Religious Services: 1 to 4 times per year    Active Member of [employer] or Organizations: Yes    Attends Banker Meetings: More than 4 times per year    Marital Status: Widowed  Intimate Partner Violence: Not At Risk (04/02/2023)   Humiliation, Afraid, Rape, and Kick questionnaire    Fear of Current or Ex-Partner: No    Emotionally Abused: No    Physically Abused: No    Sexually Abused: No    Family History  Problem Relation Age of Onset   Diabetes Mother        suicide at age 45   Hypertension Father    CVA Father     Past Surgical History:  Procedure Laterality Date   ABDOMINAL HYSTERECTOMY     BIOPSY BREAST     (L) BREAST IN 1993   CATARACT EXTRACTION Bilateral    04/22/23 RT and 05/06/2023   KNEE ARTHROSCOPY      ROS: Review of Systems Negative except as stated above  PHYSICAL EXAM: BP (!) 167/74 (BP Location: Left Arm, Patient Position: Sitting, Cuff Size: Normal)   Pulse 66   Ht 5' 5 (1.651 m)   Wt 135 lb (61.2 kg)   SpO2 98%   BMI 22.47 kg/m   Physical Exam  General appearance - alert, well appearing, and in no distress Mental status - pt tears up when talking about her daughter and son-in-law Chest - clear to auscultation, no wheezes, rales  or rhonchi, symmetric air entry Heart - Regular, no gallops or murmurs Extremities - no LE edema Skin - she has an area of resolving ecchymosis on dorsal surface of RT wrist about 4 cm in size. Several smaller scatter resolving ecchymosis areas seen on both forearms. None seen on face         Latest Ref Rng & Units 12/16/2023   11:10 AM 02/14/2023    2:08 PM 08/01/2022    9:18 AM  CMP  Glucose 70 - 99 mg/dL 91  893  881   BUN 8 - 23 mg/dL 13  13  17    Creatinine 0.44 - 1.00 mg/dL 9.19  9.25  9.14   Sodium 135 - 145 mmol/L 141  141  141   Potassium 3.5 - 5.1 mmol/L 3.6  3.9  3.7   Chloride 98 - 111 mmol/L 105  101  102   CO2 22 - 32 mmol/L 30  26  26    Calcium 8.9 - 10.3 mg/dL 9.0  9.0  9.2   Total Protein 6.5 - 8.1 g/dL 6.6   6.6   Total Bilirubin 0.0 - 1.2 mg/dL 0.8   0.5   Alkaline Phos 38 - 126 U/L 45   50   AST 15 - 41 U/L 19   23   ALT 0 - 44 U/L 13   23    Lipid Panel     Component Value Date/Time   CHOL 169 07/18/2023 1500   TRIG 100 07/18/2023 1500   HDL 72 07/18/2023 1500   CHOLHDL 2.3 07/18/2023 1500   CHOLHDL 3.2 09/02/2020 1035   VLDL 28 01/14/2017 1114   LDLCALC 79 07/18/2023 1500   LDLCALC 99 09/02/2020 1035    CBC    Component Value Date/Time   WBC 7.9 02/13/2024 1703   WBC 7.6 12/16/2023 1110  RBC 4.33 02/13/2024 1703   RBC 4.19 12/16/2023 1110   HGB 14.5 02/13/2024 1703   HCT 43.8 02/13/2024 1703   PLT 205 02/13/2024 1703   MCV 101 (H) 02/13/2024 1703   MCH 33.5 (H) 02/13/2024 1703   MCH 32.7 12/16/2023 1110   MCHC 33.1 02/13/2024 1703   MCHC 33.0 12/16/2023 1110   RDW 13.1 02/13/2024 1703   LYMPHSABS 1.7 12/16/2023 1110   MONOABS 0.5 12/16/2023 1110   EOSABS 0.0 12/16/2023 1110   BASOSABS 0.1 12/16/2023 1110    ASSESSMENT AND PLAN:  Assessment and Plan 1. Hypertension associated with diabetes (HCC) (Primary) Not at goal today but pt emotionally upset when talking about her daughter and son-in-law. Home BP readings are good.  No changes  made in meds. Continue losartan , furosemide , and diltiazem . 2. Easy bruisability Likely due to Eliquis  - CBC - PT AND PTT  3. Stressful life event affecting family 4. GAD (generalized anxiety disorder) I empathized with her situation and thankful that she has a neighbor who looks out for her. Strongly advise she consider getting a Life Alert System. Anxiety related to family stressors. On citalopram  20 mg daily. Advised against dose increase due to potential side effects. - Continue current dose of citalopram  20 mg daily.  Patient was given the opportunity to ask questions.  Patient verbalized understanding of the plan and was able to repeat key elements of the plan.   This documentation was completed using Paediatric nurse.  Any transcriptional errors are unintentional.  Orders Placed This Encounter  Procedures   CBC   PT AND PTT     Requested Prescriptions    No prescriptions requested or ordered in this encounter    Return in about 2 months (around 04/15/2024) for Cancel appt in August.  Barnie Louder, MD, GENI

## 2024-02-14 ENCOUNTER — Ambulatory Visit: Payer: Self-pay | Admitting: Internal Medicine

## 2024-02-14 LAB — CBC
Hematocrit: 43.8 % (ref 34.0–46.6)
Hemoglobin: 14.5 g/dL (ref 11.1–15.9)
MCH: 33.5 pg — ABNORMAL HIGH (ref 26.6–33.0)
MCHC: 33.1 g/dL (ref 31.5–35.7)
MCV: 101 fL — ABNORMAL HIGH (ref 79–97)
Platelets: 205 x10E3/uL (ref 150–450)
RBC: 4.33 x10E6/uL (ref 3.77–5.28)
RDW: 13.1 % (ref 11.7–15.4)
WBC: 7.9 x10E3/uL (ref 3.4–10.8)

## 2024-02-14 LAB — PT AND PTT
INR: 1 (ref 0.9–1.2)
Prothrombin Time: 11.1 s (ref 9.1–12.0)
aPTT: 29 s (ref 24–33)

## 2024-02-14 NOTE — Telephone Encounter (Signed)
 Routing as [employer]

## 2024-02-14 NOTE — Telephone Encounter (Signed)
 Copied from CRM [id number]. Topic: Clinical - Lab/Test Results >> Feb 14, 2024 11:57 AM Tonda B wrote:  Reason for CRM: pt called and I let her know her results and she said ok about taking the b12

## 2024-02-15 ENCOUNTER — Encounter: Payer: Self-pay | Admitting: Internal Medicine

## 2024-02-18 ENCOUNTER — Telehealth: Payer: Self-pay | Admitting: Internal Medicine

## 2024-02-18 NOTE — Telephone Encounter (Signed)
 Copied from CRM [id number]. Topic: Clinical - Medical Advice >> Feb 18, 2024 10:34 AM Vena H wrote:  Reason for CRM: Pt called in to report her blood pressures  after being seen on 7/17 and told to monitor over the weekend. 7/18 - 127/72 HR 72 7/19 - 132/71 HR 86 7/20- 133/70 HR 67

## 2024-02-19 NOTE — Telephone Encounter (Signed)
 Called & spoke to the patient. Verified name & DOB. Informed that per Dr.Johnson BP readings look good and to continue on her current BP medications. Patient expressed verbal understanding of all discussed. No further questions / concerns.

## 2024-02-19 NOTE — Telephone Encounter (Signed)
 Looks good. Continue her current BP meds.

## 2024-03-05 NOTE — Progress Notes (Unsigned)
 Cardiology Office Note:    Date:  03/06/2024   ID:  Ruth Delgado, DOB 1935-06-23, MRN [id number]  PCP:  Vicci Barnie NOVAK, MD  Cardiologist:  Lonni LITTIE Nanas, MD  Electrophysiologist:  None   Referring MD: Vicci Barnie NOVAK, MD   Chief Complaint  Patient presents with   Atrial Fibrillation    History of Present Illness:    Ruth Delgado is a 89 y.o. female with a hx of atrial fibrillation, T2DM, hypertension, hyperlipidemia, gout who presents for follow-up.  She was referred by Dr. Tonita for evaluation of atrial fibrillation, initially seen 11/2020.  She was admitted to [facility] from 4/19 through 11/18/2020.  She presented with sudden onset nausea/vomiting/diarrhea.  At the ED she went into A. fib with RVR and was started on diltiazem  drip.  This was transitioned to p.o. diltiazem  and she was started on Eliquis  5 mg twice daily.  Zio patch x2 weeks on 12/22/2020 showed AF burden 4%, average rate 150, with longest episode lasting 2 hours 46 minutes.  Echocardiogram 12/28/2020 showed normal biventricular function, no significant valvular disease.  Since last clinic visit, she reports she is doing okay.  Denies any chest pain, dyspnea, lightheadedness, syncope, lower extremity edema, or palpitations.  Does report she has had some leg numbness/weakness.  She has had 2 falls in the last year.  She is using a cane now, denies falls since she started using cane.  She is taking Eliquis , denies any bleeding but does have bruising.  She reports BP has been controlled when she checks at home, recently did BP log for her PCP.  Past Medical History:  Diagnosis Date   A-fib (HCC)    Anxiety    Arthritis    Breast cancer (HCC)    Diabetes mellitus    Gout    Hyperlipemia    Hypertension     Past Surgical History:  Procedure Laterality Date   ABDOMINAL HYSTERECTOMY     BIOPSY BREAST     (L) BREAST IN 1993   CATARACT EXTRACTION Bilateral    04/22/23 RT and 05/06/2023   KNEE ARTHROSCOPY       Current Medications: Current Meds  Medication Sig   acetaminophen  (TYLENOL ) 650 MG CR tablet Take 650 mg by mouth every 8 (eight) hours as needed for pain.   allopurinol  (ZYLOPRIM ) 300 MG tablet TAKE 1 TABLET BY MOUTH DAILY TO PREVENT GOUT   apixaban  (ELIQUIS ) 5 MG TABS tablet TAKE 1 TABLET BY MOUTH 2 TIMES DAILY.   cholecalciferol (VITAMIN D ) 1000 UNITS tablet Take 5,000 Units by mouth every other day.    citalopram  (CELEXA ) 20 MG tablet Take 1 tablet (20 mg total) by mouth daily.   diltiazem  (CARDIZEM  CD) 360 MG 24 hr capsule TAKE 1 CAPSULE (360 MG TOTAL) BY MOUTH DAILY.   furosemide  (LASIX ) 20 MG tablet TAKE 1 TABLET (20 MG TOTAL) BY MOUTH DAILY.   Lancets (FREESTYLE) lancets    letrozole  (FEMARA ) 2.5 MG tablet TAKE 1 TABLET (2.5 MG TOTAL) BY MOUTH DAILY.   losartan  (COZAAR ) 50 MG tablet Take 1 tablet (50 mg total) by mouth daily.   pravastatin  (PRAVACHOL ) 40 MG tablet TAKE 1 TABLET BY MOUTH DAILY.   Simethicone (GAS-X PO) Take 2 tablets by mouth daily as needed (bloating).     Allergies:   Buspar [buspirone], Lipitor [atorvastatin], Metformin and related, Metformin hcl, Nsaids, Sulfa antibiotics, Sulfacetamide sodium-sulfur, and Sulfonamide derivatives   Social History   Socioeconomic History   Marital status: Widowed  Spouse name: Not on file   Number of children: Not on file   Years of education: Not on file   Highest education level: Not on file  Occupational History   Occupation: retired  Tobacco Use   Smoking status: Former    Current packs/day: 0.00    Average packs/day: 0.5 packs/day for 25.0 years (12.5 ttl pk-yrs)    Types: Cigarettes    Start date: 07/30/1981    Quit date: 07/30/2006    Years since quitting: 17.6   Smokeless tobacco: Never   Tobacco comments:    Former smoker 07/04/2021  Substance and Sexual Activity   Alcohol use: Not Currently    Alcohol/week: 2.0 standard drinks of alcohol    Types: 2 Standard drinks or equivalent per week    Comment:  occasional, none in 3 months   Drug use: Never   Sexual activity: Not on file  Other Topics Concern   Not on file  Social History Narrative   Not on file   Social Drivers of Health   Financial Resource Strain: Low Risk  (04/02/2023)   Overall Financial Resource Strain (CARDIA)    Difficulty of Paying Living Expenses: Not hard at all  Food Insecurity: No Food Insecurity (04/02/2023)   Hunger Vital Sign    Worried About Running Out of Food in the Last Year: Never true    Ran Out of Food in the Last Year: Never true  Transportation Needs: No Transportation Needs (04/02/2023)   PRAPARE - Administrator, Civil Service (Medical): No    Lack of Transportation (Non-Medical): No  Physical Activity: Sufficiently Active (04/02/2023)   Exercise Vital Sign    Days of Exercise per Week: 5 days    Minutes of Exercise per Session: 30 min  Stress: No Stress Concern Present (04/02/2023)   [employer] of Occupational Health - Occupational Stress Questionnaire    Feeling of Stress : Not at all  Social Connections: Moderately Integrated (04/02/2023)   Social Connection and Isolation Panel    Frequency of Communication with Friends and Family: More than three times a week    Frequency of Social Gatherings with Friends and Family: Twice a week    Attends Religious Services: 1 to 4 times per year    Active Member of [employer] or Organizations: Yes    Attends Banker Meetings: More than 4 times per year    Marital Status: Widowed     Family History: The patient's family history includes CVA in her father; Diabetes in her mother; Hypertension in her father.  ROS:   Please see the history of present illness.  All other systems reviewed and are negative.  EKGs/Labs/Other Studies Reviewed:    The following studies were reviewed today:   EKG:   03/06/2024: Normal sinus rhythm, rate 70, nonspecific T wave flattening 08/22/23: NSR, rate 65 02/14/2023: Normal sinus rhythm, rate 65,  poor R wave progression, nonspecific T wave flattening 02/28/21:NSR, rate 67, No ST abnormalities 11/28/2020: Normal Sinus Rhythm. Rate 62 bpm. No ST abnormalities   Recent Labs: 12/16/2023: ALT 13; BUN 13; Creatinine, Ser 0.80; Potassium 3.6; Sodium 141 02/13/2024: Hemoglobin 14.5; Platelets 205  Recent Lipid Panel    Component Value Date/Time   CHOL 169 07/18/2023 1500   TRIG 100 07/18/2023 1500   HDL 72 07/18/2023 1500   CHOLHDL 2.3 07/18/2023 1500   CHOLHDL 3.2 09/02/2020 1035   VLDL 28 01/14/2017 1114   LDLCALC 79 07/18/2023 1500   LDLCALC  99 09/02/2020 1035    Physical Exam:    VS:  BP (!) 160/76 (BP Location: Left Arm, Patient Position: Sitting, Cuff Size: Normal)   Pulse 70   Ht 5' 5 (1.651 m)   Wt 142 lb (64.4 kg)   SpO2 98%   BMI 23.63 kg/m     Wt Readings from Last 3 Encounters:  03/06/24 142 lb (64.4 kg)  02/13/24 135 lb (61.2 kg)  12/16/23 143 lb (64.9 kg)     GEN: Well nourished, well developed in no acute distress HEENT: Normal NECK: No JVD; No carotid bruits LYMPHATICS: No lymphadenopathy CARDIAC: RRR, no murmurs, rubs, gallops RESPIRATORY:  Clear to auscultation without rales, wheezing or rhonchi  ABDOMEN: Soft, non-tender, non-distended MUSCULOSKELETAL:  compression stockings in place SKIN: Warm and dry NEUROLOGIC:  Alert and oriented x 3 PSYCHIATRIC:  Normal affect   ASSESSMENT:    1. Paroxysmal atrial fibrillation (HCC)   2. Chronic diastolic heart failure (HCC)   3. Primary hypertension   4. Hyperlipidemia, unspecified hyperlipidemia type       PLAN:    Atrial fibrillation: Diagnosed during admission with gastroenteritis 10/2020.  CHA2DS2-VASc score 5 (hypertension, age x2, diabetes, female).  Zio patch x2 weeks on 12/22/2020 showed AF burden 4%, average rate 150, with longest episode lasting 2 hours 46 minutes.  Echocardiogram 12/28/2020 showed normal biventricular function, no significant valvular disease. -Continue diltiazem  360 mg  daily -Continue Eliquis  5 mg twice daily.  Discussed risks and benefits of anticoagulation and she is in agreement with continuing on Eliquis   Chronic diastolic heart failure: On Lasix  20 mg daily.  Appears euvolemic.  Check BMET, magnesium  Hypertension: Currently on diltiazem  360 mg daily and losartan  50 mg daily.  Elevated in clinic today but appears controlled at home, will monitor  Hyperlipidemia: On pravastatin  40 mg daily.  LDL 79 on 07/18/2023  Hypokalemia: Potassium 2.6 on 11/18/2020.  Likely due to vomiting and diarrhea in the setting of Lasix  use.  Lasix  discontinued but was restarted, potassium has since been normal.  Check BMET  T2DM: A1c down to 6.0% on 07/18/2023.  Currently diet controlled.  Leg pain/numbness: check ABIs   RTC in 6 months   Medication Adjustments/Labs and Tests Ordered: Current medicines are reviewed at length with the patient today.  Concerns regarding medicines are outlined above.  Orders Placed This Encounter  Procedures   Basic Metabolic Panel (BMET)   Magnesium   EKG 12-Lead    No orders of the defined types were placed in this encounter.    Patient Instructions  Medication Instructions:  Continue current medications *If you need a refill on your cardiac medications before your next appointment, please call your pharmacy*  Lab Work: Bmet, mg If you have labs (blood work) drawn today and your tests are completely normal, you will receive your results only by: MyChart Message (if you have MyChart) OR A paper copy in the mail If you have any lab test that is abnormal or we need to change your treatment, we will call you to review the results.  Testing/Procedures: none  Follow-Up: At [facility], you and your health needs are our priority.  As part of our continuing mission to provide you with exceptional heart care, our providers are all part of one team.  This team includes your primary Cardiologist (physician) and Advanced  Practice Providers or APPs (Physician Assistants and Nurse Practitioners) who all work together to provide you with the care you need, when you need  it.  Your next appointment:   6 month(s)  Provider:   Lonni LITTIE Nanas, MD    We recommend signing up for the patient portal called MyChart.  Sign up information is provided on this After Visit Summary.  MyChart is used to connect with patients for Virtual Visits (Telemedicine).  Patients are able to view lab/test results, encounter notes, upcoming appointments, etc.  Non-urgent messages can be sent to your provider as well.   To learn more about what you can do with MyChart, go to [email or web address].   Other Instructions none        Signed, Lonni LITTIE Nanas, MD  03/06/2024 9:55 AM    [facility]

## 2024-03-06 ENCOUNTER — Encounter: Payer: Self-pay | Admitting: Cardiology

## 2024-03-06 ENCOUNTER — Ambulatory Visit: Attending: Cardiology | Admitting: Cardiology

## 2024-03-06 ENCOUNTER — Telehealth: Payer: Self-pay | Admitting: Hematology and Oncology

## 2024-03-06 VITALS — BP 160/76 | HR 70 | Ht 65.0 in | Wt 142.0 lb

## 2024-03-06 DIAGNOSIS — E785 Hyperlipidemia, unspecified: Secondary | ICD-10-CM | POA: Diagnosis not present

## 2024-03-06 DIAGNOSIS — I10 Essential (primary) hypertension: Secondary | ICD-10-CM

## 2024-03-06 DIAGNOSIS — I480 Paroxysmal atrial fibrillation: Secondary | ICD-10-CM

## 2024-03-06 DIAGNOSIS — I5032 Chronic diastolic (congestive) heart failure: Secondary | ICD-10-CM

## 2024-03-06 NOTE — Patient Instructions (Signed)
 Medication Instructions:  Continue current medications *If you need a refill on your cardiac medications before your next appointment, please call your pharmacy*  Lab Work: Bmet, mg If you have labs (blood work) drawn today and your tests are completely normal, you will receive your results only by: MyChart Message (if you have MyChart) OR A paper copy in the mail If you have any lab test that is abnormal or we need to change your treatment, we will call you to review the results.  Testing/Procedures: none  Follow-Up: At [facility], you and your health needs are our priority.  As part of our continuing mission to provide you with exceptional heart care, our providers are all part of one team.  This team includes your primary Cardiologist (physician) and Advanced Practice Providers or APPs (Physician Assistants and Nurse Practitioners) who all work together to provide you with the care you need, when you need it.  Your next appointment:   6 month(s)  Provider:   Lonni LITTIE Nanas, MD    We recommend signing up for the patient portal called MyChart.  Sign up information is provided on this After Visit Summary.  MyChart is used to connect with patients for Virtual Visits (Telemedicine).  Patients are able to view lab/test results, encounter notes, upcoming appointments, etc.  Non-urgent messages can be sent to your provider as well.   To learn more about what you can do with MyChart, go to [email or web address].   Other Instructions none

## 2024-03-06 NOTE — Telephone Encounter (Signed)
 Called to reschedule patient appointment to due provider pal  request. I talked  to patient and they are aware of the changes that was made to the upcoming appointment

## 2024-03-07 LAB — BASIC METABOLIC PANEL WITH GFR
BUN/Creatinine Ratio: 22 (ref 12–28)
BUN: 18 mg/dL (ref 8–27)
CO2: 22 mmol/L (ref 20–29)
Calcium: 9 mg/dL (ref 8.7–10.3)
Chloride: 102 mmol/L (ref 96–106)
Creatinine, Ser: 0.83 mg/dL (ref 0.57–1.00)
Glucose: 103 mg/dL — ABNORMAL HIGH (ref 70–99)
Potassium: 4.1 mmol/L (ref 3.5–5.2)
Sodium: 142 mmol/L (ref 134–144)
eGFR: 68 mL/min/1.73 (ref >59)

## 2024-03-07 LAB — MAGNESIUM: Magnesium: 2.1 mg/dL (ref 1.6–2.3)

## 2024-03-16 ENCOUNTER — Other Ambulatory Visit: Payer: Self-pay | Admitting: Cardiology

## 2024-03-17 ENCOUNTER — Telehealth: Payer: Self-pay | Admitting: Cardiology

## 2024-03-17 MED ORDER — DILTIAZEM HCL ER COATED BEADS 360 MG PO CP24: 360.0000 mg | ORAL_CAPSULE | Freq: Every day | ORAL | 3 refills | Status: AC

## 2024-03-17 NOTE — Telephone Encounter (Signed)
 *  STAT* If patient is at the pharmacy, call can be transferred to refill team.   1. Which medications need to be refilled? (please list name of each medication and dose if known)   diltiazem  (CARDIZEM  CD) 360 MG 24 hr capsule   2. Which pharmacy/location (including street and city if local pharmacy) is medication to be sent to?  [facility] - , [place] - [address]    3. Do they need a 30 day or 90 day supply? 90 days

## 2024-03-24 ENCOUNTER — Ambulatory Visit: Admitting: Internal Medicine

## 2024-04-04 ENCOUNTER — Other Ambulatory Visit (HOSPITAL_COMMUNITY): Payer: Self-pay | Admitting: Cardiology

## 2024-04-06 ENCOUNTER — Other Ambulatory Visit (HOSPITAL_COMMUNITY): Payer: Self-pay | Admitting: Cardiology

## 2024-04-07 ENCOUNTER — Ambulatory Visit: Payer: Medicare Other | Attending: Internal Medicine

## 2024-04-07 VITALS — Ht 65.0 in | Wt 143.0 lb

## 2024-04-07 DIAGNOSIS — Z0000 Encounter for general adult medical examination without abnormal findings: Secondary | ICD-10-CM

## 2024-04-07 NOTE — Patient Instructions (Signed)
 Ms. Brawley,  Thank you for taking the time for your Medicare Wellness Visit. I appreciate your continued commitment to your health goals. Please review the care plan we discussed, and feel free to reach out if I can assist you further.  Medicare recommends these wellness visits once per year to help you and your care team stay ahead of potential health issues. These visits are designed to focus on prevention, allowing your provider to concentrate on managing your acute and chronic conditions during your regular appointments.  Please note that Annual Wellness Visits do not include a physical exam. Some assessments may be limited, especially if the visit was conducted virtually. If needed, we may recommend a separate in-person follow-up with your provider.  Ongoing Care Seeing your primary care provider every 3 to 6 months helps us  monitor your health and provide consistent, personalized care.   Referrals If a referral was made during today's visit and you haven't received any updates within two weeks, please contact the referred provider directly to check on the status.  Recommended Screenings:  Health Maintenance  Topic Date Due   Complete foot exam   02/09/2021   Mammogram  11/20/2023   Flu Shot  02/28/2024   COVID-19 Vaccine (4 - 2025-26 season) 03/30/2024   Pneumococcal Vaccine for age over 47 (1 of 2 - PCV) 11/20/2024*   Hemoglobin A1C  05/22/2024   Eye exam for diabetics  10/29/2024   Medicare Annual Wellness Visit  04/07/2025   DTaP/Tdap/Td vaccine (3 - Tdap) 11/13/2027   DEXA scan (bone density measurement)  Completed   Zoster (Shingles) Vaccine  Completed   HPV Vaccine  Aged Out   Meningitis B Vaccine  Aged Out  *Topic was postponed. The date shown is not the original due date.       04/07/2024    1:40 PM  Advanced Directives  Does Patient Have a Medical Advance Directive? Yes  Type of Estate agent of [place];Living will  Copy of Healthcare Power of  Attorney in Chart? No - copy requested   Advance Care Planning is important because it: Ensures you receive medical care that aligns with your values, goals, and preferences. Provides guidance to your family and loved ones, reducing the emotional burden of decision-making during critical moments.  Vision: Annual vision screenings are recommended for early detection of glaucoma, cataracts, and diabetic retinopathy. These exams can also reveal signs of chronic conditions such as diabetes and high blood pressure.  Dental: Annual dental screenings help detect early signs of oral cancer, gum disease, and other conditions linked to overall health, including heart disease and diabetes.  Please see the attached documents for additional preventive care recommendations.

## 2024-04-07 NOTE — Progress Notes (Signed)
 Because this visit was a virtual/telehealth visit,  certain criteria was not obtained, such a blood pressure, CBG if applicable, and timed get up and go. Any medications not marked as taking were not mentioned during the medication reconciliation part of the visit. Any vitals not documented were not able to be obtained due to this being a telehealth visit or patient was unable to self-report a recent blood pressure reading due to a lack of equipment at home via telehealth. Vitals that have been documented are verbally provided by the patient.   Subjective:   Ruth Delgado is a 89 y.o. who presents for a Medicare Wellness preventive visit.  As a reminder, Annual Wellness Visits don't include a physical exam, and some assessments may be limited, especially if this visit is performed virtually. We may recommend an in-person follow-up visit with your provider if needed.  Visit Complete: Virtual I connected with  Ruth Delgado on 04/07/24 by a audio enabled telemedicine application and verified that I am speaking with the correct person using two identifiers.  Patient Location: Home  Provider Location: Home Office  I discussed the limitations of evaluation and management by telemedicine. The patient expressed understanding and agreed to proceed.  Vital Signs: Because this visit was a virtual/telehealth visit, some criteria may be missing or patient reported. Any vitals not documented were not able to be obtained and vitals that have been documented are patient reported.  VideoDeclined- This patient declined Librarian, academic. Therefore the visit was completed with audio only.  Persons Participating in Visit: Patient.  AWV Questionnaire: No: Patient Medicare AWV questionnaire was not completed prior to this visit.  Cardiac Risk Factors include: advanced age (>5men, >88 women);hypertension;dyslipidemia;family history of premature cardiovascular disease      Objective:    Today's Vitals   04/07/24 1336  Weight: 143 lb (64.9 kg)  Height: 5' 5 (1.651 m)  PainSc: 0-No pain   Body mass index is 23.8 kg/m.     04/07/2024    1:40 PM 12/16/2023    9:54 AM 04/02/2023    1:59 PM 05/18/2022   11:06 AM 02/09/2022   11:27 AM 08/30/2021    1:32 PM 11/15/2020    4:59 PM  Advanced Directives  Does Patient Have a Medical Advance Directive? Yes Yes No Yes Yes Yes No  Type of Estate agent of [employer];Living will Living will   Healthcare Power of [place];Living will Healthcare Power of [place];Living will   Does patient want to make changes to medical advance directive?    Yes (Inpatient - patient requests chaplain consult to change a medical advance directive) No - Patient declined No - Patient declined   Copy of Healthcare Power of Attorney in Chart? No - copy requested    No - copy requested No - copy requested   Would patient like information on creating a medical advance directive?   Yes (MAU/Ambulatory/Procedural Areas - Information given)    No - Patient declined    Current Medications (verified) Outpatient Encounter Medications as of 04/07/2024  Medication Sig   acetaminophen  (TYLENOL ) 650 MG CR tablet Take 650 mg by mouth every 8 (eight) hours as needed for pain.   allopurinol  (ZYLOPRIM ) 300 MG tablet TAKE 1 TABLET BY MOUTH DAILY TO PREVENT GOUT   apixaban  (ELIQUIS ) 5 MG TABS tablet TAKE 1 TABLET BY MOUTH 2 TIMES DAILY.   cholecalciferol (VITAMIN D ) 1000 UNITS tablet Take 5,000 Units by mouth every other day.  citalopram  (CELEXA ) 20 MG tablet Take 1 tablet (20 mg total) by mouth daily.   diltiazem  (CARDIZEM  CD) 360 MG 24 hr capsule Take 1 capsule (360 mg total) by mouth daily.   furosemide  (LASIX ) 20 MG tablet TAKE 1 TABLET (20 MG TOTAL) BY MOUTH DAILY.   Lancets (FREESTYLE) lancets    letrozole  (FEMARA ) 2.5 MG tablet TAKE 1 TABLET (2.5 MG TOTAL) BY MOUTH DAILY.   losartan  (COZAAR ) 50 MG tablet TAKE 1 TABLET (50 MG TOTAL)  BY MOUTH DAILY.   pravastatin  (PRAVACHOL ) 40 MG tablet TAKE 1 TABLET BY MOUTH DAILY.   Simethicone (GAS-X PO) Take 2 tablets by mouth daily as needed (bloating).   No facility-administered encounter medications on file as of 04/07/2024.    Allergies (verified) Buspar [buspirone], Lipitor [atorvastatin], Metformin and related, Metformin hcl, Nsaids, Sulfa antibiotics, Sulfacetamide sodium-sulfur, and Sulfonamide derivatives   History: Past Medical History:  Diagnosis Date   A-fib (HCC)    Anxiety    Arthritis    Breast cancer (HCC)    Diabetes mellitus    Gout    Hyperlipemia    Hypertension    Past Surgical History:  Procedure Laterality Date   ABDOMINAL HYSTERECTOMY     BIOPSY BREAST     (L) BREAST IN 1993   CATARACT EXTRACTION Bilateral    04/22/23 RT and 05/06/2023   KNEE ARTHROSCOPY     Family History  Problem Relation Age of Onset   Diabetes Mother        suicide at age 65   Hypertension Father    CVA Father    Social History   Socioeconomic History   Marital status: Widowed    Spouse name: Not on file   Number of children: Not on file   Years of education: Not on file   Highest education level: Not on file  Occupational History   Occupation: retired  Tobacco Use   Smoking status: Former    Current packs/day: 0.00    Average packs/day: 0.5 packs/day for 25.0 years (12.5 ttl pk-yrs)    Types: Cigarettes    Start date: 07/30/1981    Quit date: 07/30/2006    Years since quitting: 17.7   Smokeless tobacco: Never   Tobacco comments:    Former smoker 07/04/2021  Substance and Sexual Activity   Alcohol use: Not Currently    Alcohol/week: 2.0 standard drinks of alcohol    Types: 2 Standard drinks or equivalent per week    Comment: occasional, none in 3 months   Drug use: Never   Sexual activity: Not on file  Other Topics Concern   Not on file  Social History Narrative   Not on file   Social Drivers of Health   Financial Resource Strain: Low Risk   (04/07/2024)   Overall Financial Resource Strain (CARDIA)    Difficulty of Paying Living Expenses: Not hard at all  Food Insecurity: No Food Insecurity (04/07/2024)   Hunger Vital Sign    Worried About Running Out of Food in the Last Year: Never true    Ran Out of Food in the Last Year: Never true  Transportation Needs: No Transportation Needs (04/07/2024)   PRAPARE - Administrator, Civil Service (Medical): No    Lack of Transportation (Non-Medical): No  Physical Activity: Sufficiently Active (04/07/2024)   Exercise Vital Sign    Days of Exercise per Week: 5 days    Minutes of Exercise per Session: 30 min  Stress: No Stress Concern  Present (04/07/2024)   [employer] of Occupational Health - Occupational Stress Questionnaire    Feeling of Stress: Not at all  Social Connections: Moderately Integrated (04/07/2024)   Social Connection and Isolation Panel    Frequency of Communication with Friends and Family: More than three times a week    Frequency of Social Gatherings with Friends and Family: Twice a week    Attends Religious Services: 1 to 4 times per year    Active Member of [employer] or Organizations: Yes    Attends Banker Meetings: More than 4 times per year    Marital Status: Widowed    Tobacco Counseling Counseling given: Not Answered Tobacco comments: Former smoker 07/04/2021    Clinical Intake:  Pre-visit preparation completed: Yes  Pain : No/denies pain Pain Score: 0-No pain     BMI - recorded: 23.8 Nutritional Status: BMI of 19-24  Normal Nutritional Risks: None Diabetes: No  Lab Results  Component Value Date   HGBA1C 6.2 11/21/2023   HGBA1C 6.0 07/18/2023   HGBA1C 5.9 01/07/2023     How often do you need to have someone help you when you read instructions, pamphlets, or other written materials from your doctor or pharmacy?: 1 - Never  Interpreter Needed?: No  Information entered by :: Akim Watkinson N. Cass Edinger, LPN.   Activities of  Daily Living     04/07/2024    1:40 PM  In your present state of health, do you have any difficulty performing the following activities:  Hearing? 0  Vision? 0  Difficulty concentrating or making decisions? 0  Walking or climbing stairs? 0  Dressing or bathing? 0  Doing errands, shopping? 0  Preparing Food and eating ? N  Using the Toilet? N  In the past six months, have you accidently leaked urine? N  Do you have problems with loss of bowel control? N  Managing your Medications? N  Managing your Finances? N  Housekeeping or managing your Housekeeping? N    Patient Care Team: Vicci Barnie NOVAK, MD as PCP - General (Internal Medicine) Kate Lonni CROME, MD as PCP - Cardiology (Cardiology) Tommas Pears, MD as Consulting Physician (Endocrinology) Tyree Nanetta SAILOR, RN as Oncology Nurse Navigator Glean, Stephane BROCKS, RN (Inactive) as Oncology Nurse Navigator Belinda Cough, MD as Consulting Physician (General Surgery) Odean Potts, MD as Consulting Physician (Hematology and Oncology) Izell Domino, MD as Attending Physician (Radiation Oncology) Lavonia Lye, MD as Consulting Physician (Ophthalmology)  I have updated your Care Teams any recent Medical Services you may have received from other providers in the past year.     Assessment:   This is a routine wellness examination for [place].  Hearing/Vision screen Hearing Screening - Comments:: Denies hearing difficulties.   Vision Screening - Comments:: Wears rx glasses - up to date with routine eye exams with Lye Lavonia, MD.    Goals Addressed             This Visit's Progress    04/07/2024: To  increase my walking.         Depression Screen     04/07/2024    1:43 PM 02/13/2024    4:35 PM 11/21/2023    1:49 PM 07/18/2023    1:34 PM 04/02/2023    1:55 PM 01/07/2023   11:14 AM 09/06/2022   11:50 AM  PHQ 2/9 Scores  PHQ - 2 Score 0 0 1 0 0 0 1  PHQ- 9 Score 0 1 1 0  0 1  Fall Risk     04/07/2024    1:40  PM 04/02/2023    1:56 PM 01/07/2023   11:07 AM 05/18/2022   11:30 AM 05/04/2022   11:40 AM  Fall Risk   Falls in the past year? 1 1 0 0 0  Number falls in past yr: 0 0 0 0 0  Injury with Fall? 0 0 0 0 0  Risk for fall due to :  History of fall(s) No Fall Risks No Fall Risks No Fall Risks  Follow up Falls evaluation completed;Falls prevention discussed;Education provided Falls prevention discussed;Education provided;Falls evaluation completed  Falls evaluation completed  Falls evaluation completed      Data saved with a previous flowsheet row definition    MEDICARE RISK AT HOME:  Medicare Risk at Home Any stairs in or around the home?: No If so, are there any without handrails?: No Home free of loose throw rugs in walkways, pet beds, electrical cords, etc?: Yes Adequate lighting in your home to reduce risk of falls?: Yes Life alert?: No Use of a cane, walker or w/c?: Yes Grab bars in the bathroom?: No Shower chair or bench in shower?: No Elevated toilet seat or a handicapped toilet?: No  TIMED UP AND GO:  Was the test performed?  No  Cognitive Function: Declined/Normal: No cognitive concerns noted by patient or family. Patient alert, oriented, able to answer questions appropriately and recall recent events. No signs of memory loss or confusion.    04/07/2024    1:42 PM 07/18/2023    2:29 PM 09/06/2022   12:08 PM 02/09/2022   11:35 AM 05/30/2020   12:17 PM  MMSE - Mini Mental State Exam  Not completed: Unable to complete      Orientation to time  5 5 5 5   Orientation to Place  5 5 5 5   Registration  3 3 3 3   Attention/ Calculation  5 5 5 5   Recall  3 3 1 3   Language- name 2 objects  2 2 2 2   Language- repeat  1 1 1 1   Language- follow 3 step command  3 3 3 3   Language- read & follow direction  1 1 1 1   Write a sentence  1 1 1 1   Copy design  1 1 1 1   Total score  30 30 28 30         04/07/2024    1:44 PM 04/02/2023    1:57 PM 05/18/2022   11:08 AM  6CIT Screen  What Year?  0 points 0 points 0 points  What month? 0 points 0 points 0 points  What time? 0 points 0 points 0 points  Count back from 20 0 points 0 points 2 points  Months in reverse 0 points 0 points 0 points  Repeat phrase 0 points 0 points 0 points  Total Score 0 points 0 points 2 points    Immunizations Immunization History  Administered Date(s) Administered   Fluad Trivalent(High Dose 65+) 07/18/2023   INFLUENZA, HIGH DOSE SEASONAL PF 04/14/2015, 04/16/2017, 06/13/2018, 04/08/2019   Influenza Split 07/13/2014   Influenza, Quadrivalent, Recombinant, Inj, Pf 04/26/2020   Influenza,inj,Quad PF,6+ Mos 04/13/2021, 05/04/2022   Influenza-Unspecified 04/16/2016   PFIZER(Purple Top)SARS-COV-2 Vaccination 08/21/2019, 09/11/2019, 05/19/2020   Pneumococcal-Unspecified 06/07/2003   Td 06/02/2007, 11/12/2017   Zoster Recombinant(Shingrix ) 09/12/2021, 12/14/2021   Zoster, Live 05/30/2012    Screening Tests Health Maintenance  Topic Date Due   FOOT EXAM  02/09/2021   MAMMOGRAM  11/20/2023   Influenza Vaccine  02/28/2024   COVID-19 Vaccine (4 - 2025-26 season) 03/30/2024   Pneumococcal Vaccine: 50+ Years (1 of 2 - PCV) 11/20/2024 (Originally 02/28/1954)   HEMOGLOBIN A1C  05/22/2024   OPHTHALMOLOGY EXAM  10/29/2024   Medicare Annual Wellness (AWV)  04/07/2025   DTaP/Tdap/Td (3 - Tdap) 11/13/2027   DEXA SCAN  Completed   Zoster Vaccines- Shingrix   Completed   HPV VACCINES  Aged Out   Meningococcal B Vaccine  Aged Out    Health Maintenance Items Addressed: Yes Patient is due for Mammogram, Foot Exam, Flu and flu vaccine.  Additional Screening:  Vision Screening: Recommended annual ophthalmology exams for early detection of glaucoma and other disorders of the eye. Is the patient up to date with their annual eye exam?  Yes  Who is the provider or what is the name of the office in which the patient attends annual eye exams? Evalene Raw, MD.  Dental Screening: Recommended annual dental exams  for proper oral hygiene  Community Resource Referral / Chronic Care Management: CRR required this visit?  No   CCM required this visit?  No   Plan:    I have personally reviewed and noted the following in the patient's chart:   Medical and social history Use of alcohol, tobacco or illicit drugs  Current medications and supplements including opioid prescriptions. Patient is not currently taking opioid prescriptions. Functional ability and status Nutritional status Physical activity Advanced directives List of other physicians Hospitalizations, surgeries, and ER visits in previous 12 months Vitals Screenings to include cognitive, depression, and falls Referrals and appointments  In addition, I have reviewed and discussed with patient certain preventive protocols, quality metrics, and best practice recommendations. A written personalized care plan for preventive services as well as general preventive health recommendations were provided to patient.   Roz LOISE Fuller, LPN   0/0/[id number]   After Visit Summary: (Declined) Due to this being a telephonic visit, with patients personalized plan was offered to patient but patient Declined AVS at this time   Notes: Patient is due for Mammogram, Foot Exam, Flu and flu vaccine. Patient will discuss with provider at her next appointment.

## 2024-04-08 ENCOUNTER — Other Ambulatory Visit: Payer: Self-pay | Admitting: Cardiology

## 2024-04-08 DIAGNOSIS — I4891 Unspecified atrial fibrillation: Secondary | ICD-10-CM

## 2024-04-08 NOTE — Telephone Encounter (Signed)
 Prescription refill request for Eliquis  received. Indication:afib Last office visit:8/25 Scr:0.83  8/25 Age: 89 Weight:64.9  kg  Prescription refilled

## 2024-04-09 ENCOUNTER — Telehealth: Payer: Self-pay | Admitting: Internal Medicine

## 2024-04-09 NOTE — Telephone Encounter (Signed)
 Copied from CRM #[id number]. Topic: Appointments - Scheduling Inquiry for Clinic >> Apr 09, 2024  4:45 PM Jasmin G wrote: Reason for CRM: Pt called to try to schedule an appt at [facility] for a 6 month follow up with Dr. Lonni LITTIE Nanas, MD, she stated that she tried to call the number given to her but wasn't able to get a hold of anyone and I could not schedule the appt for her either, please call pt back at [phone] to assist.

## 2024-04-15 NOTE — Telephone Encounter (Signed)
 Called & spoke to a Dispensing optician. I was informed that they currently do not have the schedule for February our yet. Patient can call in November to schedule her follow-up appointment due in February.   Called & spoke to the patient. Verified name & DOB. Informed patient that [facility] currently does not have the schedule for February ready but can call in November to schedule her appointment due in February. Patient expressed verbal understanding.

## 2024-04-16 ENCOUNTER — Encounter: Payer: Self-pay | Admitting: Internal Medicine

## 2024-04-16 ENCOUNTER — Ambulatory Visit: Attending: Internal Medicine | Admitting: Internal Medicine

## 2024-04-16 VITALS — BP 155/75 | HR 64 | Temp 98.0°F | Ht 65.0 in | Wt 136.0 lb

## 2024-04-16 DIAGNOSIS — E1169 Type 2 diabetes mellitus with other specified complication: Secondary | ICD-10-CM | POA: Diagnosis not present

## 2024-04-16 DIAGNOSIS — Z23 Encounter for immunization: Secondary | ICD-10-CM | POA: Diagnosis not present

## 2024-04-16 DIAGNOSIS — I480 Paroxysmal atrial fibrillation: Secondary | ICD-10-CM

## 2024-04-16 DIAGNOSIS — R233 Spontaneous ecchymoses: Secondary | ICD-10-CM

## 2024-04-16 DIAGNOSIS — E1140 Type 2 diabetes mellitus with diabetic neuropathy, unspecified: Secondary | ICD-10-CM

## 2024-04-16 DIAGNOSIS — E785 Hyperlipidemia, unspecified: Secondary | ICD-10-CM

## 2024-04-16 DIAGNOSIS — I5032 Chronic diastolic (congestive) heart failure: Secondary | ICD-10-CM

## 2024-04-16 DIAGNOSIS — F411 Generalized anxiety disorder: Secondary | ICD-10-CM

## 2024-04-16 DIAGNOSIS — Z7901 Long term (current) use of anticoagulants: Secondary | ICD-10-CM

## 2024-04-16 DIAGNOSIS — E1159 Type 2 diabetes mellitus with other circulatory complications: Secondary | ICD-10-CM | POA: Diagnosis not present

## 2024-04-16 DIAGNOSIS — E538 Deficiency of other specified B group vitamins: Secondary | ICD-10-CM

## 2024-04-16 DIAGNOSIS — I152 Hypertension secondary to endocrine disorders: Secondary | ICD-10-CM

## 2024-04-16 LAB — POCT GLYCOSYLATED HEMOGLOBIN (HGB A1C): HbA1c, POC (controlled diabetic range): 6.2 % (ref 0.0–7.0)

## 2024-04-16 MED ORDER — CITALOPRAM HYDROBROMIDE 20 MG PO TABS: 20.0000 mg | ORAL_TABLET | Freq: Every day | ORAL | 1 refills | Status: AC

## 2024-04-16 NOTE — Progress Notes (Signed)
 Patient ID: CHANDNI GAGAN, female    DOB: [birth year]  MRN: [id number]  CC: Diabetes (DM & HTN f/u. /Flu vax administered on 04/16/2024 - C.A.)   Subjective: Ruth Delgado is a 89 y.o. female who presents for chronic ds management. Her concerns today include:  Pt with hx of HTN, HL, PAF on anticoagulation, chronic diastolic CHF, DM type 2, GERD, vit D def, DVT RT leg, gout, depression, gout,  DCIS RT breast ER/PR +   Discussed the use of AI scribe software for clinical note transcription with the patient, who gave verbal consent to proceed.  History of Present Illness Ruth Delgado is an 89 year old female with diabetes and hypertension who presents for a follow-up visit.  DM: Results for orders placed or performed in visit on 04/16/24  POCT glycosylated hemoglobin (Hb A1C)   Collection Time: 04/16/24  2:30 PM  Result Value Ref Range   Hemoglobin A1C     HbA1c POC (<> result, manual entry)     HbA1c, POC (prediabetic range)     HbA1c, POC (controlled diabetic range) 6.2 0.0 - 7.0 %  She manages her diabetes through diet, maintaining stable blood sugar levels 90-120 with one time low of 61.  She has not required medication for diabetes due to effective dietary control. She also experiences numbness in her legs from the knees to the ankles. Reports being dx with neuropathy by her previous doctor. This numbness has been present for over a year and is sometimes bothersome, requiring her to move to regain sensation. -She reports a vitamin B12 deficiency, for which she is taking a daily supplement. No recent falls, but she reports stumbling occasionally.  HTN/CHF: She has home BP log with her. Her blood pressure readings fluctuate, with systolic values ranging from 117 to 137 mmHg and diastolic values consistently within normal range. She is currently taking Cardizem  360 mg daily, Cozaar  50 mg daily, and furosemide  20 mg daily. She is compliant with her medication regimen. No CP/SOB.  Occasional LE edema.  HL: taking and tolerating Pravachol .  GAD: doing okay on Celexa . Continues to experience stressful life events related to her daughter's cancer dx and son-in-law who is sick  She has a history of atrial fibrillation and is on Eliquis . She experiences easy bruising and skin changes and wonders if this could be related to her anticoagulant therapy.   She experienced a subconjunctival hemorrhage in her right eye about a month ago, which resolved with the use of prescribed eye drops.      Patient Active Problem List   Diagnosis Date Noted   Hyperglycemia due to type 2 diabetes mellitus (HCC) 01/02/2022   Hypercholesterolemia 01/02/2022   History of shingles 01/02/2022   Essential hypertension 05/24/2021   Intraductal carcinoma in situ of right breast 05/23/2021   Ductal carcinoma in situ (DCIS) of right breast 05/19/2021   Paroxysmal atrial fibrillation (HCC) 12/09/2020   Hypercoagulable state due to paroxysmal atrial fibrillation (HCC) 12/09/2020   Atrial fibrillation with RVR (HCC) 11/15/2020   Nausea, vomiting and diarrhea 11/15/2020   Chronic atrial fibrillation (HCC) 11/15/2020   CKD stage 2 due to type 2 diabetes mellitus (HCC) 03/06/2018   Overweight (BMI 25.0-29.9) 03/06/2018   Osteopenia 03/06/2018   Esophageal reflux 09/06/2015   Vitamin D  deficiency 06/08/2013   Medication management 06/08/2013   Hypertension    Hyperlipemia    Gout      Current Outpatient Medications on File Prior to Visit  Medication  Sig Dispense Refill   acetaminophen  (TYLENOL ) 650 MG CR tablet Take 650 mg by mouth every 8 (eight) hours as needed for pain.     allopurinol  (ZYLOPRIM ) 300 MG tablet TAKE 1 TABLET BY MOUTH DAILY TO PREVENT GOUT 90 tablet 1   cholecalciferol (VITAMIN D ) 1000 UNITS tablet Take 5,000 Units by mouth every other day.      diltiazem  (CARDIZEM  CD) 360 MG 24 hr capsule Take 1 capsule (360 mg total) by mouth daily. 90 capsule 3   ELIQUIS  5 MG TABS tablet  TAKE 1 TABLET BY MOUTH 2 TIMES DAILY. 180 tablet 1   furosemide  (LASIX ) 20 MG tablet TAKE 1 TABLET (20 MG TOTAL) BY MOUTH DAILY. 90 tablet 3   Lancets (FREESTYLE) lancets   12   letrozole  (FEMARA ) 2.5 MG tablet TAKE 1 TABLET (2.5 MG TOTAL) BY MOUTH DAILY. 90 tablet 3   losartan  (COZAAR ) 50 MG tablet TAKE 1 TABLET (50 MG TOTAL) BY MOUTH DAILY. 90 tablet 1   pravastatin  (PRAVACHOL ) 40 MG tablet TAKE 1 TABLET BY MOUTH DAILY. 90 tablet 2   Simethicone (GAS-X PO) Take 2 tablets by mouth daily as needed (bloating).     No current facility-administered medications on file prior to visit.    Allergies  Allergen Reactions   Buspar [Buspirone]     dysphoria   Lipitor [Atorvastatin]     Myalgias   Metformin And Related     Gas/bloating   Metformin Hcl Other (See Comments)   Nsaids     GI upset   Sulfa Antibiotics Other (See Comments)    Reaction=throat itching   Sulfacetamide Sodium-Sulfur Other (See Comments)   Sulfonamide Derivatives     Social History   Socioeconomic History   Marital status: Widowed    Spouse name: Not on file   Number of children: Not on file   Years of education: Not on file   Highest education level: Not on file  Occupational History   Occupation: retired  Tobacco Use   Smoking status: Former    Current packs/day: 0.00    Average packs/day: 0.5 packs/day for 25.0 years (12.5 ttl pk-yrs)    Types: Cigarettes    Start date: 07/30/1981    Quit date: 07/30/2006    Years since quitting: 17.7   Smokeless tobacco: Never   Tobacco comments:    Former smoker 07/04/2021  Substance and Sexual Activity   Alcohol use: Not Currently    Alcohol/week: 2.0 standard drinks of alcohol    Types: 2 Standard drinks or equivalent per week    Comment: occasional, none in 3 months   Drug use: Never   Sexual activity: Not on file  Other Topics Concern   Not on file  Social History Narrative   Not on file   Social Drivers of Health   Financial Resource Strain: Low Risk   (04/07/2024)   Overall Financial Resource Strain (CARDIA)    Difficulty of Paying Living Expenses: Not hard at all  Food Insecurity: No Food Insecurity (04/07/2024)   Hunger Vital Sign    Worried About Running Out of Food in the Last Year: Never true    Ran Out of Food in the Last Year: Never true  Transportation Needs: No Transportation Needs (04/07/2024)   PRAPARE - Administrator, Civil Service (Medical): No    Lack of Transportation (Non-Medical): No  Physical Activity: Sufficiently Active (04/07/2024)   Exercise Vital Sign    Days of Exercise per Week: 5  days    Minutes of Exercise per Session: 30 min  Stress: No Stress Concern Present (04/07/2024)   [employer] of Occupational Health - Occupational Stress Questionnaire    Feeling of Stress: Not at all  Social Connections: Moderately Integrated (04/07/2024)   Social Connection and Isolation Panel    Frequency of Communication with Friends and Family: More than three times a week    Frequency of Social Gatherings with Friends and Family: Twice a week    Attends Religious Services: 1 to 4 times per year    Active Member of [employer] or Organizations: Yes    Attends Banker Meetings: More than 4 times per year    Marital Status: Widowed  Intimate Partner Violence: Not At Risk (04/07/2024)   Humiliation, Afraid, Rape, and Kick questionnaire    Fear of Current or Ex-Partner: No    Emotionally Abused: No    Physically Abused: No    Sexually Abused: No    Family History  Problem Relation Age of Onset   Diabetes Mother        suicide at age 64   Hypertension Father    CVA Father     Past Surgical History:  Procedure Laterality Date   ABDOMINAL HYSTERECTOMY     BIOPSY BREAST     (L) BREAST IN 1993   CATARACT EXTRACTION Bilateral    04/22/23 RT and 05/06/2023   KNEE ARTHROSCOPY      ROS: Review of Systems Negative except as stated above  PHYSICAL EXAM: BP (!) 155/75 (BP Location: Left Arm, Patient  Position: Sitting, Cuff Size: Normal)   Pulse 64   Temp 98 F (36.7 C) (Oral)   Ht 5' 5 (1.651 m)   Wt 136 lb (61.7 kg)   SpO2 99%   BMI 22.63 kg/m   Physical Exam  General appearance - alert, well appearing, elderly caucasian female and in no distress. Has cane with her Mental status - normal mood, behavior, speech, dress, motor activity, and thought processes Chest - clear to auscultation, no wheezes, rales or rhonchi, symmetric air entry Heart - RRR Extremities - no pitting edema Skin - some areas ecchymosis LE BL. Skin very thin     Diabetic Foot Exam - Simple   Simple Foot Form Diabetic Foot exam was performed with the following findings: Yes 04/16/2024  3:16 PM  Visual Inspection See comments: Yes Sensation Testing See comments: Yes Pulse Check Posterior Tibialis and Dorsalis pulse intact bilaterally: Yes Comments DP pulses 2+/PT 3+ BL, varicosities on dorsum of feet. Decrease sensation on plantar surface BL on LEAP. Small callous medial RT big toe         Latest Ref Rng & Units 03/06/2024   10:29 AM 12/16/2023   11:10 AM 02/14/2023    2:08 PM  CMP  Glucose 70 - 99 mg/dL 896  91  893   BUN 8 - 27 mg/dL 18  13  13    Creatinine 0.57 - 1.00 mg/dL 9.16  9.19  9.25   Sodium 134 - 144 mmol/L 142  141  141   Potassium 3.5 - 5.2 mmol/L 4.1  3.6  3.9   Chloride 96 - 106 mmol/L 102  105  101   CO2 20 - 29 mmol/L 22  30  26    Calcium 8.7 - 10.3 mg/dL 9.0  9.0  9.0   Total Protein 6.5 - 8.1 g/dL  6.6    Total Bilirubin 0.0 - 1.2 mg/dL  0.8  Alkaline Phos 38 - 126 U/L  45    AST 15 - 41 U/L  19    ALT 0 - 44 U/L  13     Lipid Panel     Component Value Date/Time   CHOL 169 07/18/2023 1500   TRIG 100 07/18/2023 1500   HDL 72 07/18/2023 1500   CHOLHDL 2.3 07/18/2023 1500   CHOLHDL 3.2 09/02/2020 1035   VLDL 28 01/14/2017 1114   LDLCALC 79 07/18/2023 1500   LDLCALC 99 09/02/2020 1035    CBC    Component Value Date/Time   WBC 7.9 02/13/2024 1703   WBC 7.6  12/16/2023 1110   RBC 4.33 02/13/2024 1703   RBC 4.19 12/16/2023 1110   HGB 14.5 02/13/2024 1703   HCT 43.8 02/13/2024 1703   PLT 205 02/13/2024 1703   MCV 101 (H) 02/13/2024 1703   MCH 33.5 (H) 02/13/2024 1703   MCH 32.7 12/16/2023 1110   MCHC 33.1 02/13/2024 1703   MCHC 33.0 12/16/2023 1110   RDW 13.1 02/13/2024 1703   LYMPHSABS 1.7 12/16/2023 1110   MONOABS 0.5 12/16/2023 1110   EOSABS 0.0 12/16/2023 1110   BASOSABS 0.1 12/16/2023 1110    ASSESSMENT AND PLAN: 1. Controlled type 2 diabetes with neuropathy (HCC) (Primary) Diet controlled.  Continue healthy eating habits - POCT glycosylated hemoglobin (Hb A1C)  2. Hypertension associated with diabetes (HCC) Elev in office but okay readings at home. Continue Cozaar  50 mg daily and Cardizem  CD 360 mg  3. Hyperlipidemia associated with type 2 diabetes mellitus (HCC) Continue Pravachol   4. PAF (paroxysmal atrial fibrillation) (HCC) Sounds to be in NSR at this time. Continue Eliquis  and Cardizem   5. Chronic diastolic congestive heart failure (HCC) Stable and compensated on Furosemide  and Cozaar   6. Vitamin B12 deficiency Continue B12 supplement - Vitamin B12  7. Easy bruisability On Eliquis . No large bruises or bleeding.  8. GAD (generalized anxiety disorder) Stable. - citalopram  (CELEXA ) 20 MG tablet; Take 1 tablet (20 mg total) by mouth daily.  Dispense: 90 tablet; Refill: 1  9. Need for influenza vaccination Given today   Patient was given the opportunity to ask questions.  Patient verbalized understanding of the plan and was able to repeat key elements of the plan.   This documentation was completed using Paediatric nurse.  Any transcriptional errors are unintentional.  Orders Placed This Encounter  Procedures   Flu vaccine HIGH DOSE PF(Fluzone Trivalent)   Vitamin B12   POCT glycosylated hemoglobin (Hb A1C)     Requested Prescriptions   Signed Prescriptions Disp Refills   citalopram   (CELEXA ) 20 MG tablet 90 tablet 1    Sig: Take 1 tablet (20 mg total) by mouth daily.    Return in about 4 months (around 08/16/2024).  Barnie Louder, MD, FACP

## 2024-04-16 NOTE — Patient Instructions (Signed)
 VISIT SUMMARY:  Today, you had a follow-up visit to review your diabetes, hypertension, atrial fibrillation, and other health concerns. Your diabetes is well-controlled through diet, and your blood pressure readings are generally stable. We discussed your symptoms of numbness in your legs, easy bruising, and a recent subconjunctival hemorrhage. We also reviewed your current medications and supplements.  YOUR PLAN:  -TYPE 2 DIABETES MELLITUS WITH DIABETIC NEUROPATHY: Type 2 diabetes is a condition where your body does not use insulin  properly, leading to high blood sugar levels. Your diabetes is well-controlled with a recent A1c of 6.2. The numbness in your legs may be related to diabetes or a vitamin B12 deficiency. Continue managing your diabetes through diet, and we will recheck your vitamin B12 levels. If your neuropathy symptoms worsen, we may consider medication.  -VITAMIN B12 DEFICIENCY: Vitamin B12 deficiency can cause symptoms like numbness and fatigue. You are currently taking a daily supplement for this. We will recheck your vitamin B12 levels to ensure they are within the normal range.  -ATRIAL FIBRILLATION ON ANTICOAGULATION WITH EASY BRUISING: Atrial fibrillation is an irregular heart rhythm that increases the risk of stroke. You are taking Eliquis  to prevent blood clots. The easy bruising you experience is likely due to this medication and age-related changes. Continue taking Eliquis  as prescribed.  -CONGESTIVE HEART FAILURE: Congestive heart failure is a condition where the heart does not pump blood as well as it should. You are managing this with furosemide . Continue taking furosemide  20 mg daily as prescribed.  -HYPERTENSION: Hypertension is high blood pressure, which can lead to serious health problems if not managed. Your blood pressure is generally controlled with Cardizem  and Cozaar . Continue taking Cardizem  360 mg daily and Cozaar  50 mg daily. We will recheck your blood pressure  in the office after you have had some rest.  -DYSLIPIDEMIA: Dyslipidemia is an abnormal amount of lipids (e.g., cholesterol) in the blood. You are managing this with pravastatin . Continue taking pravastatin  as prescribed.  INSTRUCTIONS:  We will recheck your vitamin B12 levels and your blood pressure in the office after you have had some rest. If your neuropathy symptoms worsen, please let us  know so we can consider medication. Continue with your current medications and dietary management for diabetes.

## 2024-04-17 LAB — VITAMIN B12: Vitamin B-12: 350 pg/mL (ref 232–1245)

## 2024-04-18 ENCOUNTER — Ambulatory Visit: Payer: Self-pay | Admitting: Internal Medicine

## 2024-04-20 NOTE — Telephone Encounter (Signed)
 Copied from CRM #[id number]. Topic: Clinical - Lab/Test Results >> Apr 20, 2024  2:13 PM Rosaria E wrote:  Reason for CRM: Pt called to report that she is taking 1000 MCG units of Vitamin B12 daily. Please advise

## 2024-04-22 ENCOUNTER — Telehealth: Payer: Self-pay

## 2024-04-22 ENCOUNTER — Telehealth: Payer: Self-pay | Admitting: Internal Medicine

## 2024-04-22 NOTE — Telephone Encounter (Signed)
 Pt confirmed appt 9/25 (per vr )

## 2024-04-22 NOTE — Telephone Encounter (Signed)
 Copied from CRM #[id number]. Topic: Clinical - Medication Question >> Apr 22, 2024 11:40 AM Ruth Delgado wrote: Reason for CRM: Patient has questions about taking her Vitamin D . Is requesting a call back to discuss.  Patient can be reached at [phone]

## 2024-04-22 NOTE — Telephone Encounter (Signed)
 Called & spoke to the patient and answered all questions / concerns. Please see lab results for Vit B12 for more information. No further assistance needed at this time.

## 2024-05-01 ENCOUNTER — Other Ambulatory Visit: Payer: Self-pay | Admitting: Hematology and Oncology

## 2024-05-05 ENCOUNTER — Other Ambulatory Visit: Payer: Self-pay

## 2024-05-05 ENCOUNTER — Encounter (HOSPITAL_COMMUNITY): Payer: Self-pay

## 2024-05-05 ENCOUNTER — Emergency Department (HOSPITAL_COMMUNITY)
Admission: EM | Admit: 2024-05-05 | Discharge: 2024-05-05 | Disposition: A | Discharge status: Home / Self Care | Attending: Emergency Medicine | Admitting: Emergency Medicine

## 2024-05-05 DIAGNOSIS — R197 Diarrhea, unspecified: Secondary | ICD-10-CM | POA: Diagnosis present

## 2024-05-05 DIAGNOSIS — Z7901 Long term (current) use of anticoagulants: Secondary | ICD-10-CM | POA: Insufficient documentation

## 2024-05-05 DIAGNOSIS — N390 Urinary tract infection, site not specified: Secondary | ICD-10-CM | POA: Diagnosis not present

## 2024-05-05 LAB — URINALYSIS, ROUTINE W REFLEX MICROSCOPIC
Bacteria, UA: NONE SEEN
Bilirubin Urine: NEGATIVE
Glucose, UA: NEGATIVE mg/dL
Ketones, ur: NEGATIVE mg/dL
Nitrite: NEGATIVE
Protein, ur: NEGATIVE mg/dL
Specific Gravity, Urine: 1.006 (ref 1.005–1.030)
pH: 7 (ref 5.0–8.0)

## 2024-05-05 LAB — COMPREHENSIVE METABOLIC PANEL WITH GFR
ALT: 13 U/L (ref 0–44)
AST: 21 U/L (ref 15–41)
Albumin: 4.3 g/dL (ref 3.5–5.0)
Alkaline Phosphatase: 51 U/L (ref 38–126)
Anion gap: 12 (ref 5–15)
BUN: 18 mg/dL (ref 8–23)
CO2: 24 mmol/L (ref 22–32)
Calcium: 9.3 mg/dL (ref 8.9–10.3)
Chloride: 104 mmol/L (ref 98–111)
Creatinine, Ser: 0.76 mg/dL (ref 0.44–1.00)
GFR, Estimated: >60 mL/min (ref >60)
Glucose, Bld: 90 mg/dL (ref 70–99)
Potassium: 3.6 mmol/L (ref 3.5–5.1)
Sodium: 140 mmol/L (ref 135–145)
Total Bilirubin: 0.6 mg/dL (ref 0.0–1.2)
Total Protein: 6.6 g/dL (ref 6.5–8.1)

## 2024-05-05 LAB — CBC WITH DIFFERENTIAL/PLATELET
Abs Immature Granulocytes: 0.02 K/uL (ref 0.00–0.07)
Basophils Absolute: 0.1 K/uL (ref 0.0–0.1)
Basophils Relative: 1 %
Eosinophils Absolute: 0.1 K/uL (ref 0.0–0.5)
Eosinophils Relative: 1 %
HCT: 41.1 % (ref 36.0–46.0)
Hemoglobin: 13.5 g/dL (ref 12.0–15.0)
Immature Granulocytes: 0 %
Lymphocytes Relative: 39 %
Lymphs Abs: 2.7 K/uL (ref 0.7–4.0)
MCH: 32.8 pg (ref 26.0–34.0)
MCHC: 32.8 g/dL (ref 30.0–36.0)
MCV: 99.8 fL (ref 80.0–100.0)
Monocytes Absolute: 0.5 K/uL (ref 0.1–1.0)
Monocytes Relative: 7 %
Neutro Abs: 3.6 K/uL (ref 1.7–7.7)
Neutrophils Relative %: 52 %
Platelets: 174 K/uL (ref 150–400)
RBC: 4.12 MIL/uL (ref 3.87–5.11)
RDW: 13.8 % (ref 11.5–15.5)
WBC: 6.9 K/uL (ref 4.0–10.5)
nRBC: 0 % (ref 0.0–0.2)

## 2024-05-05 LAB — LIPASE, BLOOD: Lipase: 21 U/L (ref 11–51)

## 2024-05-05 MED ORDER — LOPERAMIDE HCL 2 MG PO CAPS: 2.0000 mg | ORAL_CAPSULE | Freq: Four times a day (QID) | ORAL | 0 refills | Status: AC | PRN

## 2024-05-05 MED ORDER — SODIUM CHLORIDE 0.9 % IV SOLN
1.0000 g | Freq: Once | INTRAVENOUS | Status: AC
  Administered 2024-05-05: 1 g via INTRAVENOUS
  Filled 2024-05-05: qty 10

## 2024-05-05 MED ORDER — LOPERAMIDE HCL 2 MG PO CAPS
2.0000 mg | ORAL_CAPSULE | Freq: Once | ORAL | Status: AC
  Administered 2024-05-05: 2 mg via ORAL
  Filled 2024-05-05: qty 1

## 2024-05-05 MED ORDER — CEPHALEXIN 500 MG PO CAPS: 500.0000 mg | ORAL_CAPSULE | Freq: Two times a day (BID) | ORAL | 0 refills | Status: AC

## 2024-05-05 NOTE — Discharge Instructions (Signed)
 Today's evaluation has been generally reassuring.  Stay well-hydrated, and monitor your condition carefully.  Do not hesitate to return here for concerning changes, otherwise follow-up with your physician.

## 2024-05-05 NOTE — ED Triage Notes (Signed)
 Pt reports with diarrhea x 2 days.

## 2024-05-05 NOTE — ED Provider Notes (Signed)
 [place] EMERGENCY DEPARTMENT AT [facility] Provider Note   CSN: [id number] Arrival date & time: 05/05/24  9142     Patient presents with: Diarrhea   Ruth Delgado is a 89 y.o. female.   HPI Patient presents with concern of diarrhea.  She notes that without obvious precipitant over the past 2 days she has had innumerable episodes of loose stool.  She has no abdominal pain, though she does note grumbling sensation in her abdomen. No nausea, vomiting.  No chest pain, dyspnea. No obvious precipitant, exposure, or sick family members. She is here with her son-in-law who assists with the history.    Prior to Admission medications   Medication Sig Start Date End Date Taking? Authorizing Provider  acetaminophen  (TYLENOL ) 650 MG CR tablet Take 650 mg by mouth every 8 (eight) hours as needed for pain.    [provider]  allopurinol  (ZYLOPRIM ) 300 MG tablet TAKE 1 TABLET BY MOUTH DAILY TO PREVENT GOUT 02/05/24   Vicci Barnie NOVAK, MD  cholecalciferol (VITAMIN D ) 1000 UNITS tablet Take 5,000 Units by mouth every other day.     [provider]  citalopram  (CELEXA ) 20 MG tablet Take 1 tablet (20 mg total) by mouth daily. 04/16/24   Vicci Barnie NOVAK, MD  diltiazem  (CARDIZEM  CD) 360 MG 24 hr capsule Take 1 capsule (360 mg total) by mouth daily. 03/17/24   Kate Lonni CROME, MD  ELIQUIS  5 MG TABS tablet TAKE 1 TABLET BY MOUTH 2 TIMES DAILY. 04/08/24   Kate Lonni CROME, MD  furosemide  (LASIX ) 20 MG tablet TAKE 1 TABLET (20 MG TOTAL) BY MOUTH DAILY. 04/06/24   Kate Lonni CROME, MD  Lancets (FREESTYLE) lancets  02/10/16   [provider]  letrozole  (FEMARA ) 2.5 MG tablet TAKE 1 TABLET (2.5 MG TOTAL) BY MOUTH DAILY. 05/01/24   Gudena, Vinay, MD  losartan  (COZAAR ) 50 MG tablet TAKE 1 TABLET (50 MG TOTAL) BY MOUTH DAILY. 04/07/24   Kate Lonni CROME, MD  pravastatin  (PRAVACHOL ) 40 MG tablet TAKE 1 TABLET BY MOUTH DAILY. 12/11/23   Vicci Barnie NOVAK, MD  Simethicone (GAS-X PO) Take 2 tablets by mouth daily as needed (bloating).    [provider]    Allergies: Buspar [buspirone], Lipitor [atorvastatin], Metformin and related, Metformin hcl, Nsaids, Sulfa antibiotics, Sulfacetamide sodium-sulfur, and Sulfonamide derivatives    Review of Systems  Updated Vital Signs BP (!) 165/72 (BP Location: Right Arm)   Pulse 65   Temp 98.1 F (36.7 C) (Oral)   Resp 16   SpO2 95%   Physical Exam Vitals and nursing note reviewed.  Constitutional:      General: She is not in acute distress.    Appearance: She is well-developed.  HENT:     Head: Normocephalic and atraumatic.  Eyes:     Conjunctiva/sclera: Conjunctivae normal.  Cardiovascular:     Rate and Rhythm: Normal rate and regular rhythm.  Pulmonary:     Effort: Pulmonary effort is normal. No respiratory distress.     Breath sounds: No stridor.  Abdominal:     General: There is no distension.     Tenderness: There is no abdominal tenderness. There is no guarding.  Skin:    General: Skin is warm and dry.  Neurological:     Mental Status: She is alert and oriented to person, place, and time.     Cranial Nerves: No cranial nerve deficit.  Psychiatric:        Mood and Affect:  Mood normal.     (all labs ordered are listed, but only abnormal results are displayed) Labs Reviewed  URINALYSIS, ROUTINE W REFLEX MICROSCOPIC - Abnormal; Notable for the following components:      Result Value   Color, Urine STRAW (*)    Hgb urine dipstick SMALL (*)    Leukocytes,Ua MODERATE (*)    All other components within normal limits  COMPREHENSIVE METABOLIC PANEL WITH GFR  LIPASE, BLOOD  CBC WITH DIFFERENTIAL/PLATELET    EKG: None  Radiology: No results found.   Procedures   Medications Ordered in the ED  cefTRIAXone (ROCEPHIN) 1 g in sodium chloride  0.9 % 100 mL IVPB (1 g Intravenous New Bag/Given 05/05/24 1057)  loperamide (IMODIUM) capsule 2 mg (2 mg Oral Given  05/05/24 1037)                                    Medical Decision Making Adult female presents with several days of loose stool.  She is awake, alert, afebrile, with no abdominal pain, low suspicion for appendicitis, diverticulitis given the denial of pain, symptoms may be GI viral process, versus mass.  Patient had labs fluids.  Amount and/or Complexity of Data Reviewed Independent Historian: caregiver External Data Reviewed: notes. Labs: ordered. Decision-making details documented in ED Course. Radiology: ordered and independent interpretation performed. Decision-making details documented in ED Course.  Risk Prescription drug management.   11:31 AM On repeat exam patient is in no distress, is awake, alert, speaking clearly.  Labs generally unremarkable, electrolytes within normal limits, no leukocytosis, and absent fever, abdominal pain, low suspicion for intra-abdominal abscess or other acute infection.  Patient's urinalysis does suggest possible urinary tract infection she has started, and will receive antibiotics on discharge.     Final diagnoses:  Diarrhea, unspecified type  Lower urinary tract infection     Garrick Charleston, MD 05/05/24 1133

## 2024-05-11 ENCOUNTER — Other Ambulatory Visit: Payer: Self-pay

## 2024-05-11 ENCOUNTER — Telehealth: Payer: Self-pay

## 2024-05-11 DIAGNOSIS — D0511 Intraductal carcinoma in situ of right breast: Secondary | ICD-10-CM

## 2024-05-11 NOTE — Telephone Encounter (Signed)
 LVM regarding question about need for mammogram. Patient received mammograms on a 6 month schedule.  Orders placed for patient to have mammogram done this month. Order successfully faxed to [facility]. Patient provided with scheduling number to schedule mammogram with [facility] at her convenience. Office callback number provided should patient have any additional questions or concerns.

## 2024-05-12 ENCOUNTER — Other Ambulatory Visit: Payer: Self-pay | Admitting: *Deleted

## 2024-05-12 DIAGNOSIS — D0511 Intraductal carcinoma in situ of right breast: Secondary | ICD-10-CM

## 2024-05-12 NOTE — Progress Notes (Signed)
 Mammogram orders placed through [facility] portal.

## 2024-05-20 ENCOUNTER — Encounter: Payer: Self-pay | Admitting: Hematology and Oncology

## 2024-05-26 ENCOUNTER — Inpatient Hospital Stay: Attending: Hematology and Oncology | Admitting: Hematology and Oncology

## 2024-05-26 VITALS — BP 142/80 | HR 78 | Temp 97.9°F | Resp 16 | Ht 65.0 in | Wt 131.9 lb

## 2024-05-26 DIAGNOSIS — Z79811 Long term (current) use of aromatase inhibitors: Secondary | ICD-10-CM | POA: Diagnosis not present

## 2024-05-26 DIAGNOSIS — D0511 Intraductal carcinoma in situ of right breast: Secondary | ICD-10-CM | POA: Diagnosis present

## 2024-05-26 NOTE — Assessment & Plan Note (Signed)
 October 2022:Screening mammogram: indeterminate linear calcifications in the right breast. Diagnostic mammogram and US : 1 cm group of fine linear branching calcifications.  Stereotactic biopsy: Low-grade DCIS, ER 100%, PR 95%   Recommendation: 1.  Neoadjuvant antiestrogen therapy with tamoxifen, switched to letrozole  2.  patient did not wish to undergo surgery --------------------------------------------------------------------------------------------------------------------------------- Letrozole  toxicities: Sweating every morning: Resolved with taking letrozole  at bedtime. No other side effects to stop Intermittent breast twinges: Benign   Breast cancer surveillance: 05/19/2024: Right mammogram: Stable biopsy site low-grade DCIS  05/26/2024: Breast exam: Benign She gets mammograms at [facility] every 6 months on the right breast. Return to clinic in 1 year for follow-up

## 2024-05-26 NOTE — Progress Notes (Signed)
 Patient Care Team: Vicci Barnie NOVAK, MD as PCP - General (Internal Medicine) Kate Lonni CROME, MD as PCP - Cardiology (Cardiology) Tommas Pears, MD as Consulting Physician (Endocrinology) Tyree Nanetta SAILOR, RN as Oncology Nurse Navigator Belinda Cough, MD as Consulting Physician (General Surgery) Odean Potts, MD as Consulting Physician (Hematology and Oncology) Izell Domino, MD as Attending Physician (Radiation Oncology) Lavonia Lye, MD as Consulting Physician (Ophthalmology)  DIAGNOSIS:  Encounter Diagnosis  Name Primary?   Ductal carcinoma in situ (DCIS) of right breast Yes    SUMMARY OF ONCOLOGIC HISTORY: Oncology History  Ductal carcinoma in situ (DCIS) of right breast  05/19/2021 Initial Diagnosis   Screening mammogram: indeterminate linear calcifications in the right breast. Diagnostic mammogram and US : 1 cm group of fine linear branching calcifications.  Stereotactic biopsy: Low-grade DCIS, ER 100%, PR 95%   05/24/2021 Cancer Staging   Staging form: Breast, AJCC 8th Edition - Clinical stage from 05/24/2021: Stage 0 (cTis (DCIS), cN0, cM0, G1, ER+, PR+, HER2: Not Assessed) - Signed by Odean Potts, MD on 05/24/2021 Histologic grading system: 3 grade system     CHIEF COMPLIANT: Surveillance breast cancer  HISTORY OF PRESENT ILLNESS:   History of Present Illness Ruth Delgado is an 89 year old female who presents with gastrointestinal issues and night sweats.  She experienced severe gastrointestinal distress last Monday, leading to an emergency room visit. Symptoms included an upset stomach and frequent bowel movements, with blood noted on her hands. Treatment with Imodium resulted in constipation for three to four days. She continues to experience intermittent diarrhea and has adjusted her medication intake to manage these symptoms.  She experiences night sweats two to three times a week, waking up with wet pillows and pajamas. She attributes this  to a lack of estrogen and notes that switching her medication intake to nighttime has reduced the frequency of these episodes.  She has a history of breast issues and recently had a mammogram, which the technician said looked good. Her daughter is a cancer patient with multiple myeloma, and her son-in-law is experiencing unexplained falls and mobility issues.     ALLERGIES:  is allergic to buspar [buspirone], lipitor [atorvastatin], metformin and related, metformin hcl, nsaids, sulfa antibiotics, sulfacetamide sodium-sulfur, and sulfonamide derivatives.  MEDICATIONS:  Current Outpatient Medications  Medication Sig Dispense Refill   acetaminophen  (TYLENOL ) 650 MG CR tablet Take 650 mg by mouth every 8 (eight) hours as needed for pain.     allopurinol  (ZYLOPRIM ) 300 MG tablet TAKE 1 TABLET BY MOUTH DAILY TO PREVENT GOUT 90 tablet 1   cholecalciferol (VITAMIN D ) 1000 UNITS tablet Take 5,000 Units by mouth every other day.      citalopram  (CELEXA ) 20 MG tablet Take 1 tablet (20 mg total) by mouth daily. 90 tablet 1   diltiazem  (CARDIZEM  CD) 360 MG 24 hr capsule Take 1 capsule (360 mg total) by mouth daily. 90 capsule 3   ELIQUIS  5 MG TABS tablet TAKE 1 TABLET BY MOUTH 2 TIMES DAILY. 180 tablet 1   furosemide  (LASIX ) 20 MG tablet TAKE 1 TABLET (20 MG TOTAL) BY MOUTH DAILY. 90 tablet 3   Lancets (FREESTYLE) lancets   12   letrozole  (FEMARA ) 2.5 MG tablet TAKE 1 TABLET (2.5 MG TOTAL) BY MOUTH DAILY. 90 tablet 3   loperamide (IMODIUM) 2 MG capsule Take 1 capsule (2 mg total) by mouth 4 (four) times daily as needed for diarrhea or loose stools. 12 capsule 0   losartan  (COZAAR ) 50 MG tablet  TAKE 1 TABLET (50 MG TOTAL) BY MOUTH DAILY. 90 tablet 1   pravastatin  (PRAVACHOL ) 40 MG tablet TAKE 1 TABLET BY MOUTH DAILY. 90 tablet 2   Simethicone (GAS-X PO) Take 2 tablets by mouth daily as needed (bloating).     No current facility-administered medications for this visit.    PHYSICAL EXAMINATION: ECOG  PERFORMANCE STATUS: 1 - Symptomatic but completely ambulatory  Vitals:   05/26/24 1417  BP: (!) 142/80  Pulse: 78  Resp: 16  Temp: 97.9 F (36.6 C)  SpO2: 96%   Filed Weights   05/26/24 1417  Weight: 131 lb 14.4 oz (59.8 kg)      LABORATORY DATA:  I have reviewed the data as listed    Latest Ref Rng & Units 05/05/2024    9:03 AM 03/06/2024   10:29 AM 12/16/2023   11:10 AM  CMP  Glucose 70 - 99 mg/dL 90  896  91   BUN 8 - 23 mg/dL 18  18  13    Creatinine 0.44 - 1.00 mg/dL 9.23  9.16  9.19   Sodium 135 - 145 mmol/L 140  142  141   Potassium 3.5 - 5.1 mmol/L 3.6  4.1  3.6   Chloride 98 - 111 mmol/L 104  102  105   CO2 22 - 32 mmol/L 24  22  30    Calcium 8.9 - 10.3 mg/dL 9.3  9.0  9.0   Total Protein 6.5 - 8.1 g/dL 6.6   6.6   Total Bilirubin 0.0 - 1.2 mg/dL 0.6   0.8   Alkaline Phos 38 - 126 U/L 51   45   AST 15 - 41 U/L 21   19   ALT 0 - 44 U/L 13   13     Lab Results  Component Value Date   WBC 6.9 05/05/2024   HGB 13.5 05/05/2024   HCT 41.1 05/05/2024   MCV 99.8 05/05/2024   PLT 174 05/05/2024   NEUTROABS 3.6 05/05/2024    ASSESSMENT & PLAN:  Ductal carcinoma in situ (DCIS) of right breast October 2022:Screening mammogram: indeterminate linear calcifications in the right breast. Diagnostic mammogram and US : 1 cm group of fine linear branching calcifications.  Stereotactic biopsy: Low-grade DCIS, ER 100%, PR 95%   Recommendation: 1.  Neoadjuvant antiestrogen therapy with tamoxifen, switched to letrozole  2.  patient did not wish to undergo surgery --------------------------------------------------------------------------------------------------------------------------------- Letrozole  toxicities: Sweating every morning: Resolved with taking letrozole  at bedtime. No other side effects to stop Intermittent breast twinges: Benign   Breast cancer surveillance: 05/19/2024: Right mammogram: Stable biopsy site low-grade DCIS  05/26/2024: Breast exam: Benign She  gets mammograms at [facility] every 6 months on the right breast. Return to clinic in 1 year for follow-up    No orders of the defined types were placed in this encounter.  The patient has a good understanding of the overall plan. she agrees with it. she will call with any problems that may develop before the next visit here.  I personally spent a total of 30 minutes in the care of the patient today including preparing to see the patient, getting/reviewing separately obtained history, performing a medically appropriate exam/evaluation, counseling and educating, placing orders, referring and communicating with other health care professionals, documenting clinical information in the EHR, independently interpreting results, communicating results, and coordinating care.   Viinay K Jakyla Reza, MD 05/26/24

## 2024-05-27 ENCOUNTER — Ambulatory Visit: Payer: Medicare Other | Admitting: Hematology and Oncology

## 2024-07-27 ENCOUNTER — Telehealth: Payer: Self-pay | Admitting: Cardiology

## 2024-07-27 NOTE — Telephone Encounter (Signed)
 Daughter Susette) stated patient was disoriented on Saturday but recovered later that day.  Daughter stated patient called her again today and noted she was disoriented but daughter stated then the episode passed.  Daughter is concerned the patient may be having a stroke.  Daughter wants call to her sister Mardene) at [phone].

## 2024-07-27 NOTE — Telephone Encounter (Signed)
 Spoke with pt's daughter Ruth Delgado and husband Ruth Delgado and they stated the pt has been having memory issues and personality changes over the last 6 months. Asked if pt has recently sounded like she was slurring her words at all or if when they have last seen her if she had any facial drooping. Pt's daughter denied both. Explained that with strokes, symptoms are acute and would not be ongoing for a 6 month period. Advised that they call pt's PCP to get her seen for initial eval and to see if any referrals need to be placed. Pt's daughter verbalized understanding of plan and had no further questions at this time.

## 2024-07-27 NOTE — Telephone Encounter (Signed)
 Attempted to call pt's daughter (Diane), unable to reach. Unable to [facility].

## 2024-07-31 ENCOUNTER — Other Ambulatory Visit: Payer: Self-pay | Admitting: Internal Medicine

## 2024-07-31 DIAGNOSIS — Z8739 Personal history of other diseases of the musculoskeletal system and connective tissue: Secondary | ICD-10-CM

## 2024-08-07 ENCOUNTER — Ambulatory Visit: Payer: Self-pay

## 2024-08-07 NOTE — Telephone Encounter (Addendum)
 FYI Only or Action Required?: Action required by provider: request for appointment and clinical question for provider.  Patient was last seen in primary care on 04/16/2024 by Vicci Barnie NOVAK, MD.  Called Nurse Triage reporting Dizziness and Anxiety.  Symptoms began several days ago.  Interventions attempted: Nothing.  Symptoms are: unchanged.  Triage Disposition: See Physician Within 24 Hours  Patient/caregiver understands and will follow disposition?: No, wishes to speak with PCP   Copied from CRM #[id number]. Topic: Clinical - Red Word Triage >> Aug 07, 2024 11:13 AM Pinkey ORN wrote: Red Word that prompted transfer to Nurse Triage: Anxiety + Dizziness >> Aug 07, 2024 11:16 AM Pinkey ORN wrote: Lupita Necessary Son-In-Law Called on behalf of patient, states she's experiencing some anxiety and dizziness. States patient called him and is wife Lawrnce Necessary early this morning in a panic over the symptoms she's experiencing.  Reason for Disposition  Taking a medicine that could cause dizziness (e.g., blood pressure medications, diuretics)  Answer Assessment - Initial Assessment Questions 1. REASON FOR CALL: What is the main reason for your call? or How can I best help you? Received call from pt's daughter and son in law, patient is not with them. Callers express concerns of patient having anxiety and dizziness. Nurse informed will contact patient; daughter provided pt's cell phone # [phone].  Answer Assessment - Initial Assessment Questions No available appts. Advised UC today or ED/911 if symptoms occur/worsen: severe diff breathing, chest pain > 5 min, faint, severe dizziness. Patient verbalized understanding.   Patient requesting call back/ further recommendations; unable to drive to UC.  1. DESCRIPTION: Describe your dizziness.     Not currently dizziness, but gets dizzy when standing 2. LIGHTHEADED: Do you feel lightheaded? (e.g., somewhat faint, woozy, weak  upon standing)     no 3. VERTIGO: Do you feel like either you or the room is spinning or tilting? (i.e., vertigo)     no 4. SEVERITY: How bad is it?  Do you feel like you are going to faint? Can you stand and walk?     After standing for little bit,then able to start walking 5. ONSET:  When did the dizziness begin?     Months now, thinks that's how fell few months ago 6. AGGRAVATING FACTORS: Does anything make it worse? (e.g., standing, change in head position)     standing 8. CAUSE: What do you think is causing the dizziness? (e.g., decreased fluids or food, diarrhea, emotional distress, heat exposure, new medicine, sudden standing, vomiting; unknown)     unsure 9. RECURRENT SYMPTOM: Have you had dizziness before? If Yes, ask: When was the last time? What happened that time?     yes 10. OTHER SYMPTOMS: Do you have any other symptoms? (e.g., fever, chest pain, vomiting, diarrhea, bleeding)      Denies diff breathing, chest pain, faint, fever chills v/d Drinking plenty fluids  Protocols used: Information Only Call - No Triage-A-AH, Dizziness - Lightheadedness-A-AH Called EMS last night, BP 100/82, BS 124, reports not feeling like herself and jittery. Reports more anxious since decrease of Citalopram .

## 2024-08-07 NOTE — Telephone Encounter (Signed)
 FYI.  Patient has upcoming appointment.

## 2024-08-07 NOTE — Telephone Encounter (Signed)
 PC placed to pt this evening at 8:15 p.m x 2. Phone rings, then clicks which sounds like it picks up but no one answers. Will try again in the next 1-2 days.

## 2024-08-09 ENCOUNTER — Telehealth: Payer: Self-pay | Admitting: Internal Medicine

## 2024-08-09 NOTE — Telephone Encounter (Signed)
 Call placed to pt today. Called her cell phone. I left message informing of who I am and that I was returning call to her. Will try again tomorrow.

## 2024-08-12 NOTE — Telephone Encounter (Signed)
 PC placed to pt this morning at 10:25 a.m. I left VMM on her cell informing of who I am and that I was returning call to her. Advised if doing okay, we can discuss her concerns on her upcoming visit with me on 1/202/26.

## 2024-08-18 ENCOUNTER — Ambulatory Visit: Attending: Internal Medicine | Admitting: Internal Medicine

## 2024-08-18 VITALS — BP 160/82 | HR 75 | Ht 65.0 in | Wt 126.0 lb

## 2024-08-18 DIAGNOSIS — F411 Generalized anxiety disorder: Secondary | ICD-10-CM | POA: Diagnosis not present

## 2024-08-18 DIAGNOSIS — E538 Deficiency of other specified B group vitamins: Secondary | ICD-10-CM

## 2024-08-18 DIAGNOSIS — E1169 Type 2 diabetes mellitus with other specified complication: Secondary | ICD-10-CM

## 2024-08-18 DIAGNOSIS — I10 Essential (primary) hypertension: Secondary | ICD-10-CM

## 2024-08-18 DIAGNOSIS — I480 Paroxysmal atrial fibrillation: Secondary | ICD-10-CM | POA: Diagnosis not present

## 2024-08-18 DIAGNOSIS — R42 Dizziness and giddiness: Secondary | ICD-10-CM | POA: Diagnosis not present

## 2024-08-18 DIAGNOSIS — R634 Abnormal weight loss: Secondary | ICD-10-CM | POA: Diagnosis not present

## 2024-08-18 DIAGNOSIS — R233 Spontaneous ecchymoses: Secondary | ICD-10-CM | POA: Diagnosis not present

## 2024-08-18 DIAGNOSIS — R61 Generalized hyperhidrosis: Secondary | ICD-10-CM | POA: Diagnosis not present

## 2024-08-18 DIAGNOSIS — Z9181 History of falling: Secondary | ICD-10-CM | POA: Diagnosis not present

## 2024-08-18 DIAGNOSIS — R413 Other amnesia: Secondary | ICD-10-CM | POA: Diagnosis not present

## 2024-08-18 DIAGNOSIS — E1159 Type 2 diabetes mellitus with other circulatory complications: Secondary | ICD-10-CM

## 2024-08-18 DIAGNOSIS — R682 Dry mouth, unspecified: Secondary | ICD-10-CM

## 2024-08-18 LAB — POCT GLYCOSYLATED HEMOGLOBIN (HGB A1C): HbA1c, POC (controlled diabetic range): 5.9 % (ref 0.0–7.0)

## 2024-08-18 LAB — GLUCOSE, POCT (MANUAL RESULT ENTRY): POC Glucose: 100 mg/dL — AB (ref 70–99)

## 2024-08-18 NOTE — Progress Notes (Unsigned)
 "   Patient ID: Ruth Delgado, female    DOB: 1934-10-07  MRN: [id number]  CC: Diabetes (DM f/u./Reports fall on 06/27/24 - fractured LT arm. Seen by Dr.Landau & given sling./Dizziness, heavy feet / numbness increasing/Reports shakiness episode 08/14/24 - discuss rx/Red spots honora on bilateral legs & spreading to face/Night sweats due to anti-estrogen pill Wyn received flu vax.)   Subjective: Ruth Delgado is a 89 y.o. female who presents for chronic ds management. Son-in-law Ruth Delgado is with her. Her chronic medical issues include:  Pt with hx of HTN, HL, PAF on anticoagulation, chronic diastolic CHF, DM type 2, GERD, vit D def, DVT RT leg, gout, depression, gout,  DCIS RT breast ER/PR +   Discussed the use of AI scribe software for clinical note transcription with the patient, who gave verbal consent to proceed.  History of Present Illness Ruth Delgado is an 89 year old female who presents for a follow-up after a fall and fracture. She is accompanied by her son-in-law, Ruth Delgado.  In November, she experienced a fall resulting in a fracture of the humeral head of her left arm. No surgery was required. The arm remains sore but is improving. She was wearing a Medical Guardian alert device at the time of the fall, which facilitated a quick response.  Two weeks ago, she experienced an episode of dizziness at night, which required assistance from emergency services. She felt dizzy and was unable to walk, but no abnormalities were found upon examination.  She feels nervous and jittery in the mornings, which she attributes to a reduction in her nerve medication dosage. She takes Celexa  20 mg daily, which she believes was previously at a higher dose. She experiences relief after taking her morning dose.  She has a history of memory issues, with concerns from her daughters. She manages her daily activities independently, including driving and handling finances, but occasionally misplaces items like  her keys or cane.  She has been experiencing night sweats, which she attributes to taking Femara  for breast cancer. She reports significant sweating at night, requiring her to change clothes and dry her bedding.  She has lost 10 pounds since September, now weighing 126 pounds, and attributes this to a decreased appetite. No fever, chronic cough, or abdominal pain, though she occasionally feels bloated after meals.  She had an episode of diarrhea with bloody stools, for which she sought emergency care. The diarrhea has since resolved, and she now takes Benefiber daily in her coffee.  She reports a sore in the roof of her mouth, which is improving with the use of Biotene.    Patient Active Problem List   Diagnosis Date Noted   Hyperglycemia due to type 2 diabetes mellitus (HCC) 01/02/2022   Hypercholesterolemia 01/02/2022   History of shingles 01/02/2022   Essential hypertension 05/24/2021   Intraductal carcinoma in situ of right breast 05/23/2021   Ductal carcinoma in situ (DCIS) of right breast 05/19/2021   Paroxysmal atrial fibrillation (HCC) 12/09/2020   Hypercoagulable state due to paroxysmal atrial fibrillation (HCC) 12/09/2020   Atrial fibrillation with RVR (HCC) 11/15/2020   Nausea, vomiting and diarrhea 11/15/2020   Chronic atrial fibrillation (HCC) 11/15/2020   CKD stage 2 due to type 2 diabetes mellitus (HCC) 03/06/2018   Overweight (BMI 25.0-29.9) 03/06/2018   Osteopenia 03/06/2018   Esophageal reflux 09/06/2015   Vitamin D  deficiency 06/08/2013   Medication management 06/08/2013   Hypertension    Hyperlipemia    Gout  Medications Ordered Prior to Encounter[1]  Allergies[2]  Social History   Socioeconomic History   Marital status: Widowed    Spouse name: Not on file   Number of children: Not on file   Years of education: Not on file   Highest education level: Not on file  Occupational History   Occupation: retired  Tobacco Use   Smoking status: Former     Current packs/day: 0.00    Average packs/day: 0.5 packs/day for 25.0 years (12.5 ttl pk-yrs)    Types: Cigarettes    Start date: 07/30/1981    Quit date: 07/30/2006    Years since quitting: 18.0   Smokeless tobacco: Never   Tobacco comments:    Former smoker 07/04/2021  Substance and Sexual Activity   Alcohol use: Not Currently    Alcohol/week: 2.0 standard drinks of alcohol    Types: 2 Standard drinks or equivalent per week    Comment: occasional, none in 3 months   Drug use: Never   Sexual activity: Not on file  Other Topics Concern   Not on file  Social History Narrative   Not on file   Social Drivers of Health   Tobacco Use: Medium Risk (05/05/2024)   Patient History    Smoking Tobacco Use: Former    Smokeless Tobacco Use: Never    Passive Exposure: Not on Actuary Strain: Low Risk (04/07/2024)   Overall Financial Resource Strain (CARDIA)    Difficulty of Paying Living Expenses: Not hard at all  Food Insecurity: No Food Insecurity (04/07/2024)   Epic    Worried About Radiation Protection Practitioner of Food in the Last Year: Never true    Ran Out of Food in the Last Year: Never true  Transportation Needs: No Transportation Needs (04/07/2024)   Epic    Lack of Transportation (Medical): No    Lack of Transportation (Non-Medical): No  Physical Activity: Sufficiently Active (04/07/2024)   Exercise Vital Sign    Days of Exercise per Week: 5 days    Minutes of Exercise per Session: 30 min  Stress: No Stress Concern Present (04/07/2024)   [employer] of Occupational Health - Occupational Stress Questionnaire    Feeling of Stress: Not at all  Social Connections: Moderately Integrated (04/07/2024)   Social Connection and Isolation Panel    Frequency of Communication with Friends and Family: More than three times a week    Frequency of Social Gatherings with Friends and Family: Twice a week    Attends Religious Services: 1 to 4 times per year    Active Member of [employer] or  Organizations: Yes    Attends Banker Meetings: More than 4 times per year    Marital Status: Widowed  Intimate Partner Violence: Not At Risk (04/07/2024)   Epic    Fear of Current or Ex-Partner: No    Emotionally Abused: No    Physically Abused: No    Sexually Abused: No  Depression (PHQ2-9): Low Risk (04/07/2024)   Depression (PHQ2-9)    PHQ-2 Score: 0  Alcohol Screen: Low Risk (04/07/2024)   Alcohol Screen    Last Alcohol Screening Score (AUDIT): 0  Housing: Low Risk (04/07/2024)   Epic    Unable to Pay for Housing in the Last Year: No    Number of Times Moved in the Last Year: 0    Homeless in the Last Year: No  Utilities: Not At Risk (04/07/2024)   Epic    Threatened with loss of utilities:  No  Health Literacy: Adequate Health Literacy (04/02/2023)   B1300 Health Literacy    Frequency of need for help with medical instructions: Never    Family History  Problem Relation Age of Onset   Diabetes Mother        suicide at age 28   Hypertension Father    CVA Father     Past Surgical History:  Procedure Laterality Date   ABDOMINAL HYSTERECTOMY     BIOPSY BREAST     (L) BREAST IN 1993   CATARACT EXTRACTION Bilateral    04/22/23 RT and 05/06/2023   KNEE ARTHROSCOPY      ROS: Review of Systems Negative except as stated above  PHYSICAL EXAM: BP (!) 160/82 (BP Location: Left Arm, Patient Position: Sitting, Cuff Size: Normal)   Pulse 75   Ht 5' 5 (1.651 m)   Wt 126 lb (57.2 kg)   SpO2 97%   BMI 20.97 kg/m   Wt Readings from Last 3 Encounters:  08/18/24 126 lb (57.2 kg)  05/26/24 131 lb 14.4 oz (59.8 kg)  04/16/24 136 lb (61.7 kg)    Physical Exam  {female adult master:310786} {female adult master:310785}     04/07/2024    1:42 PM 07/18/2023    2:29 PM 09/06/2022   12:08 PM  MMSE - Mini Mental State Exam  Not completed: Unable to complete    Orientation to time  5 5  Orientation to Place  5 5  Registration  3 3  Attention/ Calculation  5 5  Recall  3 3   Language- name 2 objects  2 2  Language- repeat  1 1  Language- follow 3 step command  3 3  Language- read & follow direction  1 1  Write a sentence  1 1  Copy design  1 1  Total score  30 30       Latest Ref Rng & Units 05/05/2024    9:03 AM 03/06/2024   10:29 AM 12/16/2023   11:10 AM  CMP  Glucose 70 - 99 mg/dL 90  896  91   BUN 8 - 23 mg/dL 18  18  13    Creatinine 0.44 - 1.00 mg/dL 9.23  9.16  9.19   Sodium 135 - 145 mmol/L 140  142  141   Potassium 3.5 - 5.1 mmol/L 3.6  4.1  3.6   Chloride 98 - 111 mmol/L 104  102  105   CO2 22 - 32 mmol/L 24  22  30    Calcium 8.9 - 10.3 mg/dL 9.3  9.0  9.0   Total Protein 6.5 - 8.1 g/dL 6.6   6.6   Total Bilirubin 0.0 - 1.2 mg/dL 0.6   0.8   Alkaline Phos 38 - 126 U/L 51   45   AST 15 - 41 U/L 21   19   ALT 0 - 44 U/L 13   13    Lipid Panel     Component Value Date/Time   CHOL 169 07/18/2023 1500   TRIG 100 07/18/2023 1500   HDL 72 07/18/2023 1500   CHOLHDL 2.3 07/18/2023 1500   CHOLHDL 3.2 09/02/2020 1035   VLDL 28 01/14/2017 1114   LDLCALC 79 07/18/2023 1500   LDLCALC 99 09/02/2020 1035    CBC    Component Value Date/Time   WBC 6.9 05/05/2024 0903   RBC 4.12 05/05/2024 0903   HGB 13.5 05/05/2024 0903   HGB 14.5 02/13/2024 1703   HCT 41.1  05/05/2024 0903   HCT 43.8 02/13/2024 1703   PLT 174 05/05/2024 0903   PLT 205 02/13/2024 1703   MCV 99.8 05/05/2024 0903   MCV 101 (H) 02/13/2024 1703   MCH 32.8 05/05/2024 0903   MCHC 32.8 05/05/2024 0903   RDW 13.8 05/05/2024 0903   RDW 13.1 02/13/2024 1703   LYMPHSABS 2.7 05/05/2024 0903   MONOABS 0.5 05/05/2024 0903   EOSABS 0.1 05/05/2024 0903   BASOSABS 0.1 05/05/2024 0903    ASSESSMENT AND PLAN:  Assessment and Plan Assessment & Plan Left humeral head fracture, healing Healing with residual soreness. No surgical intervention required. - Continue current management and monitor healing progress.  Type 2 diabetes mellitus with diabetic neuropathy Blood sugar  well-controlled with A1c of 5.9. - Continue current diabetes management and neuropathy treatment.  Paroxysmal atrial fibrillation Recent dizziness episodes. No atrial fibrillation detected. - Continue current medication regimen. - Follow up with cardiologist before August.  Hypertension Generally well-controlled with occasional elevations. Current medications include lorazepam, furosemide , and diltiazem . - Continue current antihypertensive regimen. - Monitor blood pressure regularly.  Generalized anxiety disorder Managed with Celexa  20 mg. Higher doses not recommended due to cardiac risks. - Continue Celexa  20 mg daily. - Monitor anxiety symptoms and adjust treatment as needed.  History of breast cancer, on aromatase inhibitor therapy Managed with Femara . Significant night sweats likely related to medication. - Continue aromatase inhibitor therapy.  Unintentional weight loss 10-pound loss since last visit. Possible causes include decreased appetite and night sweats. Differential includes thyroid  dysfunction and medication side effects. - Ordered blood tests including thyroid  function tests. - Encouraged nutritional intake with smaller, more frequent meals or nutritional supplements like Glacerna.  Age-related cognitive changes Mild changes with occasional forgetfulness. Previous mini mental status exam normal. - Performed mini mental status exam today. - Will consider neuropsychiatric evaluation if cognitive changes progress.  Vitamin B12 deficiency, under treatment Previously identified with low normal levels. Current supplementation may need adjustment. - Ordered blood test to recheck B12 levels. - Adjust B12 supplementation based on test results.     1. Controlled type 2 diabetes with neuropathy (HCC) (Primary) *** - POCT glucose (manual entry) - POCT glycosylated hemoglobin (Hb A1C)    Patient was given the opportunity to ask questions.  Patient verbalized  understanding of the plan and was able to repeat key elements of the plan.   This documentation was completed using Paediatric nurse.  Any transcriptional errors are unintentional.  Orders Placed This Encounter  Procedures   POCT glucose (manual entry)   POCT glycosylated hemoglobin (Hb A1C)     Requested Prescriptions    No prescriptions requested or ordered in this encounter    No follow-ups on file.  Barnie Louder, MD, FACP    [1]  Current Outpatient Medications on File Prior to Visit  Medication Sig Dispense Refill   acetaminophen  (TYLENOL ) 650 MG CR tablet Take 650 mg by mouth every 8 (eight) hours as needed for pain.     allopurinol  (ZYLOPRIM ) 300 MG tablet TAKE 1 TABLET BY MOUTH DAILY TO PREVENT GOUT 90 tablet 1   cholecalciferol (VITAMIN D ) 1000 UNITS tablet Take 5,000 Units by mouth every other day.      citalopram  (CELEXA ) 20 MG tablet Take 1 tablet (20 mg total) by mouth daily. 90 tablet 1   diltiazem  (CARDIZEM  CD) 360 MG 24 hr capsule Take 1 capsule (360 mg total) by mouth daily. 90 capsule 3   ELIQUIS  5 MG TABS tablet  TAKE 1 TABLET BY MOUTH 2 TIMES DAILY. 180 tablet 1   furosemide  (LASIX ) 20 MG tablet TAKE 1 TABLET (20 MG TOTAL) BY MOUTH DAILY. 90 tablet 3   Lancets (FREESTYLE) lancets   12   letrozole  (FEMARA ) 2.5 MG tablet TAKE 1 TABLET (2.5 MG TOTAL) BY MOUTH DAILY. 90 tablet 3   loperamide  (IMODIUM ) 2 MG capsule Take 1 capsule (2 mg total) by mouth 4 (four) times daily as needed for diarrhea or loose stools. 12 capsule 0   losartan  (COZAAR ) 50 MG tablet TAKE 1 TABLET (50 MG TOTAL) BY MOUTH DAILY. 90 tablet 1   pravastatin  (PRAVACHOL ) 40 MG tablet TAKE 1 TABLET BY MOUTH DAILY. 90 tablet 2   Simethicone (GAS-X PO) Take 2 tablets by mouth daily as needed (bloating).     No current facility-administered medications on file prior to visit.  [2]  Allergies Allergen Reactions   Buspar [Buspirone]     dysphoria   Lipitor [Atorvastatin]      Myalgias   Metformin And Related     Gas/bloating   Metformin Hcl Other (See Comments)   Nsaids     GI upset   Sulfa Antibiotics Other (See Comments)    Reaction=throat itching   Sulfacetamide Sodium-Sulfur Other (See Comments)   Sulfonamide Derivatives    "

## 2024-08-19 ENCOUNTER — Encounter: Payer: Self-pay | Admitting: Internal Medicine

## 2024-08-19 LAB — COMPREHENSIVE METABOLIC PANEL WITH GFR
ALT: 13 IU/L (ref 0–32)
AST: 17 IU/L (ref 0–40)
Albumin: 4.6 g/dL (ref 3.7–4.7)
Alkaline Phosphatase: 63 IU/L (ref 48–129)
BUN/Creatinine Ratio: 22 (ref 12–28)
BUN: 17 mg/dL (ref 8–27)
Bilirubin Total: 0.5 mg/dL (ref 0.0–1.2)
CO2: 23 mmol/L (ref 20–29)
Calcium: 9.4 mg/dL (ref 8.7–10.3)
Chloride: 99 mmol/L (ref 96–106)
Creatinine, Ser: 0.78 mg/dL (ref 0.57–1.00)
Globulin, Total: 2.4 g/dL (ref 1.5–4.5)
Glucose: 95 mg/dL (ref 70–99)
Potassium: 3.3 mmol/L — ABNORMAL LOW (ref 3.5–5.2)
Sodium: 138 mmol/L (ref 134–144)
Total Protein: 7 g/dL (ref 6.0–8.5)
eGFR: 73 mL/min/1.73

## 2024-08-19 LAB — TSH+T4F+T3FREE
Free T4: 1.18 ng/dL (ref 0.82–1.77)
T3, Free: 2.5 pg/mL (ref 2.0–4.4)
TSH: 1.41 u[IU]/mL (ref 0.450–4.500)

## 2024-08-19 LAB — CBC
Hematocrit: 40.5 % (ref 34.0–46.6)
Hemoglobin: 13.3 g/dL (ref 11.1–15.9)
MCH: 33.3 pg — ABNORMAL HIGH (ref 26.6–33.0)
MCHC: 32.8 g/dL (ref 31.5–35.7)
MCV: 101 fL — ABNORMAL HIGH (ref 79–97)
Platelets: 218 x10E3/uL (ref 150–450)
RBC: 4 x10E6/uL (ref 3.77–5.28)
RDW: 12.9 % (ref 11.7–15.4)
WBC: 8.3 x10E3/uL (ref 3.4–10.8)

## 2024-08-19 LAB — VITAMIN B12: Vitamin B-12: 425 pg/mL (ref 232–1245)

## 2024-08-19 LAB — SJOGREN'S SYNDROME ANTIBODS(SSA + SSB)
ENA SSA (RO) Ab: 0.7 AI (ref 0.0–0.9)
ENA SSB (LA) Ab: <0.2 AI (ref 0.0–0.9)

## 2024-08-19 MED ORDER — POTASSIUM CHLORIDE CRYS ER 10 MEQ PO TBCR: 10.0000 meq | EXTENDED_RELEASE_TABLET | Freq: Every day | ORAL | 1 refills | Status: AC

## 2024-08-19 NOTE — Patient Instructions (Addendum)
 SABRA

## 2024-08-20 ENCOUNTER — Telehealth: Payer: Self-pay | Admitting: Internal Medicine

## 2024-08-20 NOTE — Telephone Encounter (Signed)
 Contacted patient, Appointment scheduled.  pt needs f/u appt in 6 weeks -Dr. Vicci.

## 2024-08-24 ENCOUNTER — Telehealth: Payer: Self-pay | Admitting: Internal Medicine

## 2024-08-24 MED ORDER — APIXABAN 2.5 MG PO TABS: 2.5000 mg | ORAL_TABLET | Freq: Two times a day (BID) | ORAL | 5 refills | Status: AC

## 2024-08-24 NOTE — Telephone Encounter (Signed)
 Called & spoke to the patient. Verified name & DOB. Informed that Dr.Johnson heard back from cardiologist Dr.Schumann. He stated that based on her weight, we can now decrease the dose of the Eliquis  from 5 mg twice a day to 2.5 mg twice a day. Patient confirmed to use  [facility] - [address] [place] [address].  Patient confirmed understanding to stop the 5 mg once she gets the 2.5 mg tablets to take twice daily. Patient has no further questions / concerns.

## 2024-08-24 NOTE — Telephone Encounter (Signed)
 Let pt know that I heard back from her cardiologist Dr. Kate. He said based on her weight, we can now decrease the dose of the Eliquis  from 5 mg twice a day to 2.5 mg twice a day. Let me know where to send the prescription. Stop the 5 mg once she gets the 2.5 mg tablets.

## 2024-08-24 NOTE — Telephone Encounter (Signed)
-----   Message from Lonni Nanas, MD sent at 08/23/2024  9:34 PM EST ----- Dr. Vicci, Yes it looks like her weight is now below the 60 kg cutoff and given that and her age she would meet criteria to reduce her Eliquis  to 2.5 mg twice daily [place] ----- Message ----- From: Vicci Barnie NOVAK, MD Sent: 08/23/2024   5:09 PM EST To: Lonni LITTIE Nanas, MD  I am the PCP for this pt whom I saw recently. She has hx of A.fib and is on Eliquis . She has been having some dizziness and recently fell broke her LT shoulder. I am working her up for the dizziness.  She mentioned that you had told her on last visit that if she lost some wgh the dose of the Eliquis  can be reduced. She has had unintentional weight loss I think due to stress/anxiety about her daughter's health who has Myeloma and son-in-law who is also hospitalized with serious health issues. Is she a candidated for 2.5 mg dose?

## 2024-08-29 ENCOUNTER — Telehealth: Payer: Self-pay | Admitting: Internal Medicine

## 2024-08-29 NOTE — Telephone Encounter (Signed)
-----   Message from Mackey Chad, MD sent at 08/25/2024  4:33 PM EST ----- Regarding: RE: Question The best way to confirm it would be to tell her to stop the Femara  for 2 weeks and we will know if it is the medication that is causing the symptoms.  There is no increase in risk of breast cancer if you stop it for short period of time.  That is what I would suggest.   Let me call her and instruct her of the above. Thank you Vinay ----- Message ----- From: Vicci Barnie NOVAK, MD Sent: 08/23/2024   5:03 PM EST To: Mackey Chad, MD Subject: Question                                       Good afternoon Dr. Chad. I am the PCP for Ms. Cremer whom I saw recently for fall and dizziness. She broke her shoulder. She complained a lot about the night sweats from the Femara . She sleeps with thermostat set to 74. I advise truning down the temp at night to about 70-71 and sleeping in cotton material. She nonetheless wanted me to ask if changing to another med would help. I told her that I think all of them can cause hot flashes. Second concern is that she is having episodic dizziness. I always start by looking to see if any meds on current list may be causing or contributing. She is on BP meds; advised to go slow with position changes and stay hydrated. CT head ordered.  I read on Updated that Femara  can cause dizziness in about 14% cases. Wondered whether this is contributing and any suggestions you may have.

## 2024-08-31 ENCOUNTER — Ambulatory Visit: Admitting: Internal Medicine

## 2024-09-01 ENCOUNTER — Telehealth: Payer: Self-pay | Admitting: *Deleted

## 2024-09-01 NOTE — Telephone Encounter (Signed)
 Received call from pt with complaint of severe hot flashes while on Letrozole . Per MD pt needing to hold Letrozole  x2 weeks and f/u with telephone visit to assess symptoms.  Pt educated and verbalized understanding.

## 2024-09-16 ENCOUNTER — Inpatient Hospital Stay: Admitting: Hematology and Oncology

## 2024-09-16 ENCOUNTER — Other Ambulatory Visit

## 2024-09-24 ENCOUNTER — Other Ambulatory Visit

## 2024-10-01 ENCOUNTER — Ambulatory Visit: Payer: Self-pay | Admitting: Internal Medicine

## 2024-11-20 ENCOUNTER — Ambulatory Visit: Admitting: Cardiology

## 2025-04-13 ENCOUNTER — Ambulatory Visit

## 2025-05-26 ENCOUNTER — Inpatient Hospital Stay: Admitting: Hematology and Oncology
